# Patient Record
Sex: Male | Born: 1955 | Race: White | Hispanic: No | Marital: Married | Smoking: Former smoker
Health system: Southern US, Community
[De-identification: ages and names within clinical notes are randomized; demographics above are authoritative.]

## PROBLEM LIST (undated history)

## (undated) DIAGNOSIS — M1990 Unspecified osteoarthritis, unspecified site: Secondary | ICD-10-CM

## (undated) DIAGNOSIS — Z87891 Personal history of nicotine dependence: Secondary | ICD-10-CM

## (undated) DIAGNOSIS — I509 Heart failure, unspecified: Secondary | ICD-10-CM

## (undated) DIAGNOSIS — G459 Transient cerebral ischemic attack, unspecified: Secondary | ICD-10-CM

## (undated) DIAGNOSIS — R9430 Abnormal result of cardiovascular function study, unspecified: Secondary | ICD-10-CM

## (undated) DIAGNOSIS — I358 Other nonrheumatic aortic valve disorders: Secondary | ICD-10-CM

## (undated) DIAGNOSIS — T8859XA Other complications of anesthesia, initial encounter: Secondary | ICD-10-CM

## (undated) DIAGNOSIS — E8770 Fluid overload, unspecified: Secondary | ICD-10-CM

## (undated) DIAGNOSIS — I739 Peripheral vascular disease, unspecified: Secondary | ICD-10-CM

## (undated) DIAGNOSIS — R42 Dizziness and giddiness: Secondary | ICD-10-CM

## (undated) DIAGNOSIS — I219 Acute myocardial infarction, unspecified: Secondary | ICD-10-CM

## (undated) DIAGNOSIS — E785 Hyperlipidemia, unspecified: Secondary | ICD-10-CM

## (undated) DIAGNOSIS — I10 Essential (primary) hypertension: Secondary | ICD-10-CM

## (undated) DIAGNOSIS — Z951 Presence of aortocoronary bypass graft: Secondary | ICD-10-CM

## (undated) DIAGNOSIS — E119 Type 2 diabetes mellitus without complications: Secondary | ICD-10-CM

## (undated) DIAGNOSIS — I340 Nonrheumatic mitral (valve) insufficiency: Secondary | ICD-10-CM

## (undated) DIAGNOSIS — G479 Sleep disorder, unspecified: Secondary | ICD-10-CM

## (undated) DIAGNOSIS — I779 Disorder of arteries and arterioles, unspecified: Secondary | ICD-10-CM

## (undated) DIAGNOSIS — S060XAA Concussion with loss of consciousness status unknown, initial encounter: Secondary | ICD-10-CM

## (undated) DIAGNOSIS — IMO0002 Reserved for concepts with insufficient information to code with codable children: Secondary | ICD-10-CM

## (undated) DIAGNOSIS — I2510 Atherosclerotic heart disease of native coronary artery without angina pectoris: Secondary | ICD-10-CM

## (undated) HISTORY — DX: Fluid overload, unspecified: E87.70

## (undated) HISTORY — PX: CHOLECYSTECTOMY: SHX55

## (undated) HISTORY — DX: Abnormal result of cardiovascular function study, unspecified: R94.30

## (undated) HISTORY — DX: Sleep disorder, unspecified: G47.9

## (undated) HISTORY — DX: Disorder of arteries and arterioles, unspecified: I77.9

## (undated) HISTORY — DX: Dizziness and giddiness: R42

## (undated) HISTORY — PX: CORONARY ARTERY BYPASS GRAFT: SHX141

## (undated) HISTORY — DX: Nonrheumatic mitral (valve) insufficiency: I34.0

## (undated) HISTORY — DX: Personal history of nicotine dependence: Z87.891

## (undated) HISTORY — DX: Essential (primary) hypertension: I10

## (undated) HISTORY — DX: Hyperlipidemia, unspecified: E78.5

## (undated) HISTORY — DX: Atherosclerotic heart disease of native coronary artery without angina pectoris: I25.10

## (undated) HISTORY — DX: Peripheral vascular disease, unspecified: I73.9

## (undated) HISTORY — DX: Presence of aortocoronary bypass graft: Z95.1

## (undated) HISTORY — DX: Other nonrheumatic aortic valve disorders: I35.8

## (undated) HISTORY — DX: Transient cerebral ischemic attack, unspecified: G45.9

## (undated) HISTORY — DX: Reserved for concepts with insufficient information to code with codable children: IMO0002

---

## 1999-12-30 ENCOUNTER — Emergency Department (HOSPITAL_COMMUNITY): Admission: EM | Admit: 1999-12-30 | Discharge: 1999-12-30 | Payer: Self-pay | Admitting: *Deleted

## 2002-01-02 DIAGNOSIS — Z951 Presence of aortocoronary bypass graft: Secondary | ICD-10-CM

## 2002-01-02 HISTORY — DX: Presence of aortocoronary bypass graft: Z95.1

## 2002-01-25 ENCOUNTER — Inpatient Hospital Stay (HOSPITAL_COMMUNITY): Admission: RE | Admit: 2002-01-25 | Discharge: 2002-01-30 | Payer: Self-pay | Admitting: Cardiovascular Disease

## 2002-01-26 ENCOUNTER — Encounter: Payer: Self-pay | Admitting: Thoracic Surgery (Cardiothoracic Vascular Surgery)

## 2002-01-27 ENCOUNTER — Encounter: Payer: Self-pay | Admitting: Thoracic Surgery (Cardiothoracic Vascular Surgery)

## 2002-01-28 ENCOUNTER — Encounter: Payer: Self-pay | Admitting: Surgery

## 2002-01-29 ENCOUNTER — Encounter: Payer: Self-pay | Admitting: Thoracic Surgery (Cardiothoracic Vascular Surgery)

## 2002-03-12 ENCOUNTER — Encounter (HOSPITAL_COMMUNITY): Admission: RE | Admit: 2002-03-12 | Discharge: 2002-06-10 | Payer: Self-pay | Admitting: Cardiology

## 2003-08-12 ENCOUNTER — Ambulatory Visit: Admission: RE | Admit: 2003-08-12 | Discharge: 2003-08-12 | Payer: Self-pay | Admitting: Cardiology

## 2003-11-25 ENCOUNTER — Ambulatory Visit: Admission: RE | Admit: 2003-11-25 | Discharge: 2003-11-25 | Payer: Self-pay | Admitting: Cardiology

## 2004-02-17 ENCOUNTER — Encounter: Payer: Self-pay | Admitting: Cardiology

## 2004-02-20 ENCOUNTER — Encounter (INDEPENDENT_AMBULATORY_CARE_PROVIDER_SITE_OTHER): Payer: Self-pay | Admitting: *Deleted

## 2004-02-20 ENCOUNTER — Observation Stay (HOSPITAL_COMMUNITY): Admission: RE | Admit: 2004-02-20 | Discharge: 2004-02-21 | Payer: Self-pay | Admitting: General Surgery

## 2004-05-29 ENCOUNTER — Ambulatory Visit: Payer: Self-pay | Admitting: Internal Medicine

## 2004-06-03 ENCOUNTER — Ambulatory Visit: Payer: Self-pay | Admitting: Internal Medicine

## 2004-06-10 ENCOUNTER — Ambulatory Visit: Payer: Self-pay | Admitting: Cardiology

## 2004-06-10 ENCOUNTER — Ambulatory Visit: Payer: Self-pay | Admitting: Internal Medicine

## 2004-08-07 ENCOUNTER — Ambulatory Visit: Payer: Self-pay | Admitting: Cardiology

## 2004-08-28 ENCOUNTER — Ambulatory Visit: Admission: RE | Admit: 2004-08-28 | Discharge: 2004-08-28 | Payer: Self-pay | Admitting: Pulmonary Disease

## 2004-09-01 ENCOUNTER — Ambulatory Visit: Payer: Self-pay | Admitting: Internal Medicine

## 2004-09-18 ENCOUNTER — Ambulatory Visit: Payer: Self-pay | Admitting: Cardiology

## 2004-10-29 ENCOUNTER — Ambulatory Visit: Payer: Self-pay | Admitting: Cardiology

## 2004-11-12 ENCOUNTER — Ambulatory Visit: Payer: Self-pay | Admitting: Cardiology

## 2004-12-09 ENCOUNTER — Ambulatory Visit: Payer: Self-pay | Admitting: Cardiology

## 2005-01-13 ENCOUNTER — Ambulatory Visit: Payer: Self-pay | Admitting: Cardiology

## 2005-01-25 ENCOUNTER — Ambulatory Visit: Payer: Self-pay | Admitting: Cardiology

## 2005-01-25 ENCOUNTER — Ambulatory Visit: Payer: Self-pay | Admitting: Internal Medicine

## 2005-01-26 ENCOUNTER — Ambulatory Visit: Payer: Self-pay | Admitting: Cardiology

## 2005-02-26 ENCOUNTER — Ambulatory Visit: Payer: Self-pay | Admitting: Internal Medicine

## 2005-03-01 ENCOUNTER — Ambulatory Visit: Payer: Self-pay

## 2005-03-01 ENCOUNTER — Encounter: Payer: Self-pay | Admitting: Cardiology

## 2005-03-15 ENCOUNTER — Ambulatory Visit: Payer: Self-pay | Admitting: Cardiology

## 2005-04-30 ENCOUNTER — Ambulatory Visit: Payer: Self-pay | Admitting: Cardiology

## 2005-06-04 ENCOUNTER — Ambulatory Visit: Payer: Self-pay | Admitting: Internal Medicine

## 2005-07-29 ENCOUNTER — Ambulatory Visit: Payer: Self-pay | Admitting: Cardiology

## 2005-07-30 ENCOUNTER — Ambulatory Visit: Payer: Self-pay | Admitting: Cardiology

## 2005-08-09 ENCOUNTER — Ambulatory Visit: Payer: Self-pay | Admitting: Cardiology

## 2005-09-08 ENCOUNTER — Ambulatory Visit: Admission: RE | Admit: 2005-09-08 | Discharge: 2005-09-08 | Payer: Self-pay | Admitting: Pulmonary Disease

## 2005-09-08 ENCOUNTER — Ambulatory Visit: Payer: Self-pay | Admitting: Acute Care

## 2005-11-19 ENCOUNTER — Ambulatory Visit: Payer: Self-pay | Admitting: Cardiology

## 2006-05-05 ENCOUNTER — Ambulatory Visit: Payer: Self-pay | Admitting: Cardiology

## 2006-05-09 ENCOUNTER — Ambulatory Visit: Payer: Self-pay | Admitting: Cardiology

## 2006-05-09 LAB — CONVERTED CEMR LAB
Chol/HDL Ratio, serum: 4.2
LDL Cholesterol: 73 mg/dL (ref 0–99)
Triglyceride fasting, serum: 92 mg/dL (ref 0–149)

## 2006-06-17 ENCOUNTER — Ambulatory Visit: Payer: Self-pay | Admitting: Cardiology

## 2006-06-17 LAB — CONVERTED CEMR LAB
ALT: 23 units/L (ref 0–40)
Albumin: 3.9 g/dL (ref 3.5–5.2)
Total Protein: 7.1 g/dL (ref 6.0–8.3)

## 2006-08-23 ENCOUNTER — Ambulatory Visit: Payer: Self-pay | Admitting: Cardiology

## 2006-09-22 ENCOUNTER — Ambulatory Visit: Payer: Self-pay | Admitting: Cardiology

## 2006-09-22 LAB — CONVERTED CEMR LAB
Albumin: 3.7 g/dL (ref 3.5–5.2)
Bilirubin, Direct: 0.2 mg/dL (ref 0.0–0.3)
LDL Cholesterol: 52 mg/dL (ref 0–99)
Total Bilirubin: 1 mg/dL (ref 0.3–1.2)
Total CHOL/HDL Ratio: 2.9
Total Protein: 7.2 g/dL (ref 6.0–8.3)
VLDL: 12 mg/dL (ref 0–40)

## 2006-10-28 ENCOUNTER — Ambulatory Visit: Payer: Self-pay | Admitting: Cardiology

## 2006-12-19 ENCOUNTER — Ambulatory Visit: Payer: Self-pay | Admitting: *Deleted

## 2007-01-11 ENCOUNTER — Ambulatory Visit: Payer: Self-pay | Admitting: Cardiology

## 2007-01-11 LAB — CONVERTED CEMR LAB
BUN: 11 mg/dL (ref 6–23)
Calcium: 9.1 mg/dL (ref 8.4–10.5)
Chloride: 107 meq/L (ref 96–112)
Creatinine, Ser: 1 mg/dL (ref 0.4–1.5)
Glucose, Bld: 133 mg/dL — ABNORMAL HIGH (ref 70–99)

## 2007-02-23 ENCOUNTER — Ambulatory Visit: Payer: Self-pay | Admitting: Internal Medicine

## 2007-02-23 ENCOUNTER — Encounter: Payer: Self-pay | Admitting: Internal Medicine

## 2007-04-21 ENCOUNTER — Ambulatory Visit: Payer: Self-pay | Admitting: Cardiology

## 2007-04-21 LAB — CONVERTED CEMR LAB
Albumin: 4 g/dL (ref 3.5–5.2)
Alkaline Phosphatase: 55 units/L (ref 39–117)
HDL: 28.5 mg/dL — ABNORMAL LOW (ref >39.0)
LDL Cholesterol: 41 mg/dL (ref 0–99)
Total CHOL/HDL Ratio: 2.8
Total Protein: 7.2 g/dL (ref 6.0–8.3)
Triglycerides: 58 mg/dL (ref 0–149)

## 2007-04-28 ENCOUNTER — Ambulatory Visit: Payer: Self-pay | Admitting: Pulmonary Disease

## 2007-11-22 ENCOUNTER — Ambulatory Visit: Payer: Self-pay | Admitting: Cardiology

## 2008-04-30 ENCOUNTER — Telehealth: Payer: Self-pay | Admitting: Internal Medicine

## 2008-05-01 ENCOUNTER — Ambulatory Visit: Payer: Self-pay | Admitting: Internal Medicine

## 2008-05-01 DIAGNOSIS — H6590 Unspecified nonsuppurative otitis media, unspecified ear: Secondary | ICD-10-CM | POA: Insufficient documentation

## 2008-05-14 ENCOUNTER — Ambulatory Visit: Payer: Self-pay | Admitting: Cardiology

## 2008-06-10 ENCOUNTER — Ambulatory Visit: Payer: Self-pay | Admitting: Cardiology

## 2008-06-10 LAB — CONVERTED CEMR LAB
ALT: 45 units/L (ref 0–53)
HDL: 38.8 mg/dL — ABNORMAL LOW (ref >39.0)
Total Protein: 7.5 g/dL (ref 6.0–8.3)

## 2008-10-10 ENCOUNTER — Telehealth: Payer: Self-pay | Admitting: Internal Medicine

## 2008-11-22 ENCOUNTER — Encounter (INDEPENDENT_AMBULATORY_CARE_PROVIDER_SITE_OTHER): Payer: Self-pay | Admitting: *Deleted

## 2008-11-26 ENCOUNTER — Telehealth: Payer: Self-pay | Admitting: Cardiology

## 2008-11-27 ENCOUNTER — Ambulatory Visit: Payer: Self-pay | Admitting: Internal Medicine

## 2008-12-30 ENCOUNTER — Encounter: Payer: Self-pay | Admitting: Cardiology

## 2009-01-01 ENCOUNTER — Ambulatory Visit: Payer: Self-pay | Admitting: Internal Medicine

## 2009-01-01 LAB — CONVERTED CEMR LAB
ALT: 28 units/L (ref 0–53)
Albumin: 3.9 g/dL (ref 3.5–5.2)
Basophils Relative: 0.9 % (ref 0.0–3.0)
Cholesterol: 103 mg/dL (ref 0–200)
Creatinine, Ser: 0.9 mg/dL (ref 0.4–1.5)
Eosinophils Relative: 1.9 % (ref 0.0–5.0)
GFR calc non Af Amer: 93.33 mL/min (ref >60)
Glucose, Bld: 153 mg/dL — ABNORMAL HIGH (ref 70–99)
HCT: 40.9 % (ref 39.0–52.0)
Hemoglobin: 14.1 g/dL (ref 13.0–17.0)
LDL Cholesterol: 58 mg/dL (ref 0–99)
Leukocytes, UA: NEGATIVE
Lymphs Abs: 2.1 10*3/uL (ref 0.7–4.0)
MCHC: 34.5 g/dL (ref 30.0–36.0)
MCV: 95.5 fL (ref 78.0–100.0)
Monocytes Absolute: 0.5 10*3/uL (ref 0.1–1.0)
Monocytes Relative: 9.7 % (ref 3.0–12.0)
Neutro Abs: 2.8 10*3/uL (ref 1.4–7.7)
Neutrophils Relative %: 50.5 % (ref 43.0–77.0)
Nitrite: NEGATIVE
Platelets: 183 10*3/uL (ref 150.0–400.0)
Potassium: 4.1 meq/L (ref 3.5–5.1)
Total Bilirubin: 1.1 mg/dL (ref 0.3–1.2)
Triglycerides: 81 mg/dL (ref 0.0–149.0)
Urine Glucose: NEGATIVE mg/dL
Urobilinogen, UA: 0.2 (ref 0.0–1.0)

## 2009-01-15 ENCOUNTER — Ambulatory Visit: Payer: Self-pay | Admitting: Internal Medicine

## 2009-01-15 ENCOUNTER — Encounter: Payer: Self-pay | Admitting: Cardiology

## 2009-01-15 DIAGNOSIS — R0609 Other forms of dyspnea: Secondary | ICD-10-CM | POA: Insufficient documentation

## 2009-01-15 DIAGNOSIS — R0989 Other specified symptoms and signs involving the circulatory and respiratory systems: Secondary | ICD-10-CM | POA: Insufficient documentation

## 2009-01-15 DIAGNOSIS — R7989 Other specified abnormal findings of blood chemistry: Secondary | ICD-10-CM | POA: Insufficient documentation

## 2009-01-16 ENCOUNTER — Ambulatory Visit: Payer: Self-pay | Admitting: Cardiology

## 2009-01-28 ENCOUNTER — Ambulatory Visit: Payer: Self-pay

## 2009-01-29 ENCOUNTER — Encounter: Payer: Self-pay | Admitting: Cardiology

## 2009-03-05 ENCOUNTER — Ambulatory Visit: Payer: Self-pay | Admitting: Gastroenterology

## 2009-03-19 ENCOUNTER — Ambulatory Visit: Payer: Self-pay | Admitting: Gastroenterology

## 2009-03-19 ENCOUNTER — Encounter: Payer: Self-pay | Admitting: Gastroenterology

## 2009-03-19 HISTORY — PX: COLONOSCOPY: SHX174

## 2009-03-24 ENCOUNTER — Encounter: Payer: Self-pay | Admitting: Gastroenterology

## 2009-05-21 ENCOUNTER — Encounter (INDEPENDENT_AMBULATORY_CARE_PROVIDER_SITE_OTHER): Payer: Self-pay | Admitting: *Deleted

## 2009-07-09 ENCOUNTER — Ambulatory Visit: Payer: Self-pay | Admitting: Internal Medicine

## 2009-07-09 LAB — CONVERTED CEMR LAB
Creatinine, Ser: 1 mg/dL (ref 0.4–1.5)
Glucose, Bld: 139 mg/dL — ABNORMAL HIGH (ref 70–99)
Hgb A1c MFr Bld: 6.9 % — ABNORMAL HIGH (ref 4.6–6.5)
Potassium: 4.1 meq/L (ref 3.5–5.1)
Sodium: 140 meq/L (ref 135–145)

## 2009-07-11 ENCOUNTER — Ambulatory Visit: Payer: Self-pay | Admitting: Internal Medicine

## 2009-07-16 ENCOUNTER — Telehealth: Payer: Self-pay | Admitting: Internal Medicine

## 2009-09-30 ENCOUNTER — Telehealth: Payer: Self-pay | Admitting: Internal Medicine

## 2009-09-30 ENCOUNTER — Ambulatory Visit: Payer: Self-pay | Admitting: Internal Medicine

## 2009-09-30 DIAGNOSIS — R21 Rash and other nonspecific skin eruption: Secondary | ICD-10-CM | POA: Insufficient documentation

## 2009-09-30 DIAGNOSIS — M79609 Pain in unspecified limb: Secondary | ICD-10-CM | POA: Insufficient documentation

## 2009-11-06 ENCOUNTER — Encounter: Payer: Self-pay | Admitting: Cardiology

## 2009-11-07 ENCOUNTER — Ambulatory Visit: Payer: Self-pay | Admitting: Cardiology

## 2009-11-10 ENCOUNTER — Encounter: Payer: Self-pay | Admitting: Cardiology

## 2009-11-10 LAB — CONVERTED CEMR LAB
ALT: 23 units/L (ref 0–53)
AST: 23 units/L (ref 0–37)
Albumin: 4 g/dL (ref 3.5–5.2)
Alkaline Phosphatase: 62 units/L (ref 39–117)
Basophils Absolute: 0 10*3/uL (ref 0.0–0.1)
Bilirubin, Direct: 0.1 mg/dL (ref 0.0–0.3)
Calcium: 9.3 mg/dL (ref 8.4–10.5)
Cholesterol: 93 mg/dL (ref 0–200)
Creatinine, Ser: 0.7 mg/dL (ref 0.4–1.5)
Eosinophils Relative: 1.9 % (ref 0.0–5.0)
GFR calc non Af Amer: 116.61 mL/min (ref >60)
Glucose, Bld: 129 mg/dL — ABNORMAL HIGH (ref 70–99)
HCT: 41.8 % (ref 39.0–52.0)
Lymphs Abs: 2.2 10*3/uL (ref 0.7–4.0)
MCV: 95.2 fL (ref 78.0–100.0)
Monocytes Absolute: 0.6 10*3/uL (ref 0.1–1.0)
Monocytes Relative: 10.3 % (ref 3.0–12.0)
Neutrophils Relative %: 49.8 % (ref 43.0–77.0)
Platelets: 192 10*3/uL (ref 150.0–400.0)
Potassium: 4 meq/L (ref 3.5–5.1)
RDW: 12.6 % (ref 11.5–14.6)
Total Protein: 7.2 g/dL (ref 6.0–8.3)
Triglycerides: 86 mg/dL (ref 0.0–149.0)
WBC: 5.8 10*3/uL (ref 4.5–10.5)

## 2009-11-28 ENCOUNTER — Encounter: Payer: Self-pay | Admitting: Cardiology

## 2009-11-28 ENCOUNTER — Ambulatory Visit (HOSPITAL_COMMUNITY): Admission: RE | Admit: 2009-11-28 | Discharge: 2009-11-28 | Payer: Self-pay | Admitting: Cardiology

## 2009-11-28 ENCOUNTER — Ambulatory Visit: Payer: Self-pay | Admitting: Cardiovascular Disease

## 2009-11-28 ENCOUNTER — Ambulatory Visit: Payer: Self-pay

## 2009-12-17 ENCOUNTER — Encounter: Payer: Self-pay | Admitting: Cardiology

## 2010-01-19 ENCOUNTER — Ambulatory Visit: Payer: Self-pay | Admitting: Internal Medicine

## 2010-01-19 DIAGNOSIS — L570 Actinic keratosis: Secondary | ICD-10-CM | POA: Insufficient documentation

## 2010-01-19 DIAGNOSIS — M542 Cervicalgia: Secondary | ICD-10-CM

## 2010-01-30 ENCOUNTER — Ambulatory Visit: Payer: Self-pay

## 2010-01-30 ENCOUNTER — Encounter: Payer: Self-pay | Admitting: Cardiology

## 2010-02-09 ENCOUNTER — Telehealth: Payer: Self-pay | Admitting: Internal Medicine

## 2010-02-19 ENCOUNTER — Encounter: Payer: Self-pay | Admitting: Cardiology

## 2010-02-24 ENCOUNTER — Telehealth: Payer: Self-pay | Admitting: Cardiology

## 2010-03-18 ENCOUNTER — Ambulatory Visit: Payer: Self-pay | Admitting: Internal Medicine

## 2010-03-18 LAB — CONVERTED CEMR LAB
ALT: 31 units/L (ref 0–53)
BUN: 19 mg/dL (ref 6–23)
Basophils Absolute: 0 10*3/uL (ref 0.0–0.1)
Bilirubin, Direct: 0.2 mg/dL (ref 0.0–0.3)
Calcium: 9.4 mg/dL (ref 8.4–10.5)
Cholesterol: 113 mg/dL (ref 0–200)
Creatinine, Ser: 0.9 mg/dL (ref 0.4–1.5)
Eosinophils Absolute: 0.1 10*3/uL (ref 0.0–0.7)
GFR calc non Af Amer: 91.74 mL/min (ref >60)
HDL: 28.6 mg/dL — ABNORMAL LOW (ref >39.00)
Hemoglobin, Urine: NEGATIVE
Hemoglobin: 14.4 g/dL (ref 13.0–17.0)
LDL Cholesterol: 62 mg/dL (ref 0–99)
Lymphocytes Relative: 31.9 % (ref 12.0–46.0)
MCHC: 34.9 g/dL (ref 30.0–36.0)
Neutro Abs: 3.8 10*3/uL (ref 1.4–7.7)
PSA: 0.69 ng/mL (ref 0.10–4.00)
Platelets: 207 10*3/uL (ref 150.0–400.0)
RDW: 13.1 % (ref 11.5–14.6)
TSH: 1.98 microintl units/mL (ref 0.35–5.50)
Total Bilirubin: 0.7 mg/dL (ref 0.3–1.2)
Triglycerides: 113 mg/dL (ref 0.0–149.0)
Urine Glucose: NEGATIVE mg/dL
Urobilinogen, UA: 0.2 (ref 0.0–1.0)
VLDL: 22.6 mg/dL (ref 0.0–40.0)

## 2010-03-23 ENCOUNTER — Ambulatory Visit: Payer: Self-pay | Admitting: Internal Medicine

## 2010-05-15 ENCOUNTER — Ambulatory Visit: Payer: Self-pay | Admitting: Internal Medicine

## 2010-05-15 DIAGNOSIS — M25569 Pain in unspecified knee: Secondary | ICD-10-CM | POA: Insufficient documentation

## 2010-05-15 DIAGNOSIS — J40 Bronchitis, not specified as acute or chronic: Secondary | ICD-10-CM

## 2010-08-02 LAB — CONVERTED CEMR LAB
ALT: 26 units/L (ref 0–53)
AST: 25 units/L (ref 0–37)
Alkaline Phosphatase: 55 units/L (ref 39–117)
Cholesterol: 112 mg/dL (ref 0–200)
HDL: 28.5 mg/dL — ABNORMAL LOW (ref >39.0)
LDL Cholesterol: 63 mg/dL (ref 0–99)
Total Bilirubin: 1 mg/dL (ref 0.3–1.2)
Total Protein: 7.7 g/dL (ref 6.0–8.3)
Triglycerides: 102 mg/dL (ref 0–149)

## 2010-08-06 NOTE — Letter (Signed)
Summary: test results  [facility], Main Office  [address]   [place], [place] [address]   Phone: [phone]  Fax: [phone]        December 17, 2009 MRN: [id number]    [name] [address] [place], [place]  [address]    Dear Mr. Amirault,  We have been unable to reach you by phone regarding your test results.  Please give our office a call to receive your results.     Sincerely,  Meredith Staggers, RN  This letter has been electronically signed by your physician.

## 2010-08-06 NOTE — Progress Notes (Signed)
Summary: test results  Phone Note Call from Patient Call back at [phone]   Caller: Patient Summary of Call: Pt calling for test results Initial call taken by: Judie Grieve,  February 24, 2010 3:59 PM  Follow-up for Phone Call        pt given results Meredith Staggers, RN  February 24, 2010 4:25 PM

## 2010-08-06 NOTE — Letter (Signed)
Summary: Custom - Lipid  [facility], Main Office  [address]   [place], [place] [address]   Phone: [phone]  Fax: [phone]     Nov 10, 2009 MRN: [id number]   [name] [address] [place], [place]  [address]   Dear [name],  We have reviewed your cholesterol results.  They are as follows:     Total Cholesterol:    93 (Desirable: less than 200)       HDL  Cholesterol:     31.00  (Desirable: greater than 40 for men and 50 for women)       LDL Cholesterol:       45  (Desirable: less than 100 for low risk and less than 70 for moderate to high risk)       Triglycerides:       86.0  (Desirable: less than 150)  Our recommendations include:  Looks Great, continue current medications   Call our office at the number listed above if you have any questions.  Lowering your LDL cholesterol is important, but it is only one of a large number of "risk factors" that may indicate that you are at risk for heart disease, stroke or other complications of hardening of the arteries.  Other risk factors include:   A.  Cigarette Smoking* B.  High Blood Pressure* C.  Obesity* D.   Low HDL Cholesterol (see yours above)* E.   Diabetes Mellitus (higher risk if your is uncontrolled) F.  Family history of premature heart disease G.  Previous history of stroke or cardiovascular disease    *These are risk factors YOU HAVE CONTROL OVER.  For more information, visit .  There is now evidence that lowering the TOTAL CHOLESTEROL AND LDL CHOLESTEROL can reduce the risk of heart disease.  The American Heart Association recommends the following guidelines for the treatment of elevated cholesterol:  1.  If there is now current heart disease and less than two risk factors, TOTAL CHOLESTEROL should be less than 200 and LDL CHOLESTEROL should be less than 100. 2.  If there is current heart disease or two or more risk factors, TOTAL CHOLESTEROL should be less than 200 and LDL  CHOLESTEROL should be less than 70.  A diet low in cholesterol, saturated fat, and calories is the cornerstone of treatment for elevated cholesterol.  Cessation of smoking and exercise are also important in the management of elevated cholesterol and preventing vascular disease.  Studies have shown that 30 to 60 minutes of physical activity most days can help lower blood pressure, lower cholesterol, and keep your weight at a healthy level.  Drug therapy is used when cholesterol levels do not respond to therapeutic lifestyle changes (smoking cessation, diet, and exercise) and remains unacceptably high.  If medication is started, it is important to have you levels checked periodically to evaluate the need for further treatment options.  Thank you,    [facility] Team

## 2010-08-06 NOTE — Progress Notes (Signed)
Summary: OV TODAY  Phone Note Call from Patient   Summary of Call: Patient is requesting a call back. C/o leg pain x 2 wks. Wants to know if he needs office visit.  Initial call taken by: Lamar Sprinkles, CMA,  September 30, 2009 9:00 AM  Follow-up for Phone Call        Scheduled for eval today Follow-up by: Lamar Sprinkles, CMA,  September 30, 2009 9:02 AM

## 2010-08-06 NOTE — Assessment & Plan Note (Signed)
Summary: 6 mos well with labs/cd   Vital Signs:  Patient profile:   55 year old male Height:      73 inches (185.42 cm) Weight:      246 pounds (111.82 kg) BMI:     32.57 O2 Sat:      97 % on Room air Temp:     98.1 degrees F (36.72 degrees C) oral Pulse rate:   68 / minute Pulse rhythm:   regular Resp:     16 per minute BP sitting:   140 / 80  (left arm) Cuff size:   regular  Vitals Entered By: Lanier Prude, CMA(AAMA) (January 19, 2010 8:31 AM)  O2 Flow:  Room air CC: CPX Is Patient Diabetic? No Comments pt states he is taking Vytorin 1500mg  1 once daily.  He states he recently had EKG w/Dr. Myrtis Ser.   Primary Care Provider:  Tresa Garter MD  CC:  CPX.  History of Present Illness: The patient presents for a follow up of hypertension, diabetes, hyperlipidemia The patient presents for a wellness examination  C/o R thigh skin lesion  Current Medications (verified): 1)  Vytorin 10-80 Mg  Tabs (Ezetimibe-Simvastatin) .... Once Daily 2)  Protonix 40 Mg  Tbec (Pantoprazole Sodium) .... Once Daily Brand Medically Necessary 3)  Diovan 160 Mg  Tabs (Valsartan) .... Once Daily 4)  Aspirin Ec 81 Mg  Tbec (Aspirin) .... Once Daily 5)  Coreg 25 Mg  Tabs (Carvedilol) .Marland Kitchen.. 1 Tab Two Times A Day 6)  Niaspan 500 Mg Cr-Tabs (Niacin (Antihyperlipidemic)) .... Take 3 Tablets Daily 7)  Klor-Con 20 Meq  Pack (Potassium Chloride) .... As Needed 8)  Lasix 40 Mg  Tabs (Furosemide) .... As Needed 9)  Xyzal 5 Mg Tabs (Levocetirizine Dihydrochloride) .... Once Daily 10)  Vitamin D3 1000 Unit  Tabs (Cholecalciferol) .Marland Kitchen.. 1 By Mouth Daily 11)  Tramadol Hcl 50 Mg Tabs (Tramadol Hcl) .Marland Kitchen.. 1-2 Tabs By Mouth Two Times A Day As Needed Pain 12)  Ibuprofen 600 Mg Tabs (Ibuprofen) .Marland Kitchen.. 1 By Mouth Bid  Pc X 1 Wk Then As Needed For  Pain  Allergies (verified): No Known Drug Allergies  Past History:  Past Medical History: Last updated: 11/06/2009 Hypertension Peripheral vascular disease     Dr.  Georganna Skeans Transient ischemic attack, hx of heart murmur.Marland Kitchenaortic valve sclerosis CAD CABG July 2003  .... nuclear scan 2005 Ejection fraction 40%    does not need ICD....  echo 2006 Hyperlipidemia... low HDL Prior cigarette use stop Sleep evaluation by Dr. Shelle Iron.. recommendation to lose weight and consider sleep study later Volume overload Carotid artery disease.... Doppler... July, 2010... chronic occlusion left common carotid artery... 60-79% RICA, 0-39% LICA...unchanged.... followup one year  Past Surgical History: Coronary artery bypass graft  Family History: Reviewed history from 01/15/2009 and no changes required. Family History of CAD Male 1st degree relative <50 Family History Diabetes 1st degree relative Family History of Stroke M 1st degree relative <50  Social History: Reviewed history from 05/01/2008 and no changes required. Married Never Smoked Alcohol use-no Drug use-no Regular exercise-no  Review of Systems       The patient complains of weight gain.  The patient denies anorexia, fever, weight loss, vision loss, decreased hearing, hoarseness, chest pain, syncope, dyspnea on exertion, peripheral edema, prolonged cough, headaches, hemoptysis, abdominal pain, melena, hematochezia, severe indigestion/heartburn, hematuria, incontinence, genital sores, muscle weakness, suspicious skin lesions, transient blindness, difficulty walking, depression, unusual weight change, abnormal bleeding, enlarged lymph nodes, angioedema, and  testicular masses.    Physical Exam  General:  alert, well-developed, well-nourished, well-hydrated, appropriate dress, normal appearance, and overweight-appearing.   Head:  Normocephalic and atraumatic without obvious abnormalities. No apparent alopecia or balding. Eyes:  No corneal or conjunctival inflammation noted. EOMI. Perrla.  Ears:  serous fluid behind right TML ear normal and no external deformities.  TM is pearly grey with good light  reflex. nu bulging. negative pinna pull test. EAC neg for erythema, swelling, or discharge Nose:  External nasal examination shows no deformity or inflammation. Nasal mucosa are pink and moist without lesions or exudates. Mouth:  Oral mucosa and oropharynx without lesions or exudates.  Teeth in good repair. Neck:  supple, full ROM, no masses, and no thyromegaly.   Lungs:  Normal respiratory effort, chest expands symmetrically. Lungs are clear to auscultation, no crackles or wheezes. Heart:  Normal rate and regular rhythm. S1 and S2 normal without gallop, murmur, click, rub or other extra sounds. Abdomen:  Obese, soft and non-tender.   Rectal:  No external abnormalities noted. Normal sphincter tone. No rectal masses or tenderness. Genitalia:  Testes bilaterally descended without nodularity, tenderness or masses. No scrotal masses or lesions. No penis lesions or urethral discharge. Prostate:  Prostate gland firm and smooth, no enlargement, nodularity, tenderness, mass, asymmetry or induration. Msk:  Lumbar-sacral spine is tender to palpation over paraspinal muscles and painfull with the ROM  Stiff LS Pulses:  R and L carotid,radial,femoral,dorsalis pedis and posterior tibial pulses are full and equal bilaterally Extremities:  No pedal edema.   Neurologic:  Strait leg elev (-) B DTRs, MS OK Skin:  R thigh anter aspect AK x1 Cervical Nodes:  No lymphadenopathy noted Inguinal Nodes:  No significant adenopathy Psych:  Cognition and judgment appear intact. Alert and cooperative with normal attention span and concentration. No apparent delusions, illusions, hallucinations   Impression & Recommendations:  Problem # 1:  PHYSICAL EXAMINATION (ICD-V70.0) Assessment New Health and age related issues were discussed. Available screening tests and vaccinations were discussed as well. Healthy life style including good diet and execise was discussed.   Problem # 2:  EDEMA (ICD-782.3) Assessment:  Improved  Prednisone -Take 40mg  qd for 3 days, then 20 mg qd for 3 days, then 10mg  qd for 6 days, then stop. Take pc.   His updated medication list for this problem includes:    Lasix 40 Mg Tabs (Furosemide) .Marland Kitchen... As needed  Problem # 3:  ACTINIC KERATOSIS (ICD-702.0) R thigh  Procedure: cryo Indication: AK(s) Risks incl. scar(s), incomplete removal, ect.  and benefits discussed     1  lesion(s) on R anter thigh was/were treated with liqid N2 in usual fasion.  Tolerated well. Compl. none. Wound care instructions given.   Orders: Cryotherapy/Destruction benign or premalignant lesion (1st lesion)  (17000)  Problem # 4:  LEG PAIN (ICD-729.5) Assessment: Unchanged Discussed PT Use a roller Orders: T-Lumbar Spine 2 Views (72100TC)  Problem # 5:  CAD (ICD-414.00) Assessment: Unchanged  His updated medication list for this problem includes:    Diovan 160 Mg Tabs (Valsartan) ..... Once daily    Aspirin Ec 81 Mg Tbec (Aspirin) ..... Once daily    Coreg 25 Mg Tabs (Carvedilol) .Marland Kitchen... 1 tab two times a day    Lasix 40 Mg Tabs (Furosemide) .Marland Kitchen... As needed  Problem # 6:  PERIPHERAL VASCULAR DISEASE (ICD-443.9) Assessment: Unchanged  Complete Medication List: 1)  Vytorin 10-80 Mg Tabs (Ezetimibe-simvastatin) .... Once daily 2)  Protonix 40 Mg Tbec (Pantoprazole  sodium) .... Once daily brand medically necessary 3)  Diovan 160 Mg Tabs (Valsartan) .... Once daily 4)  Aspirin Ec 81 Mg Tbec (Aspirin) .... Once daily 5)  Coreg 25 Mg Tabs (Carvedilol) .Marland Kitchen.. 1 tab two times a day 6)  Niaspan 500 Mg Cr-tabs (Niacin (antihyperlipidemic)) .... Take 3 tablets daily 7)  Klor-con 20 Meq Pack (Potassium chloride) .... As needed 8)  Lasix 40 Mg Tabs (Furosemide) .... As needed 9)  Xyzal 5 Mg Tabs (Levocetirizine dihydrochloride) .... Once daily 10)  Vitamin D3 1000 Unit Tabs (Cholecalciferol) .Marland Kitchen.. 1 by mouth daily 11)  Tramadol Hcl 50 Mg Tabs (Tramadol hcl) .Marland Kitchen.. 1-2 tabs by mouth two times a day as  needed pain 12)  Ibuprofen 600 Mg Tabs (Ibuprofen) .Marland Kitchen.. 1 by mouth bid  pc x 1 wk then as needed for  pain 13)  Prednisone 10 Mg Tabs (Prednisone) .... Take 40mg  qd for 3 days, then 20 mg qd for 3 days, then 10mg  qd for 6 days, then stop. take pc.  Other Orders: T-Cervicle Spine 2-3 Views [id number])  Patient Instructions: 1)  Please schedule a follow-up appointment in 2-3 month. 2)  Use stretching and balance exercises that I have provided (15 min. or longer every day)  3)  BMP prior to visit, ICD-9: 4)  Hepatic Panel prior to visit, ICD-9: v70.0 5)  Lipid Panel prior to visit, ICD-9: 6)  TSH prior to visit, ICD-9: 7)  CBC w/ Diff prior to visit, ICD-9: 8)  Urine-dip prior to visit, ICD-9: 9)  PSA prior to visit, ICD-9: Prescriptions: LASIX 40 MG  TABS (FUROSEMIDE) as needed  #90 x 3   Entered and Authorized by:   Tresa Garter MD   Signed by:   Tresa Garter MD on 01/19/2010   Method used:   Print then Give to Patient   RxID:   [id number] KLOR-CON 20 MEQ  PACK (POTASSIUM CHLORIDE) as needed  #90 x 3   Entered and Authorized by:   Tresa Garter MD   Signed by:   Tresa Garter MD on 01/19/2010   Method used:   Print then Give to Patient   RxID:   [id number] NIASPAN 500 MG CR-TABS (NIACIN (ANTIHYPERLIPIDEMIC)) TAKE 3 TABLETS DAILY  #270 x 3   Entered and Authorized by:   Tresa Garter MD   Signed by:   Tresa Garter MD on 01/19/2010   Method used:   Print then Give to Patient   RxID:   [id number] COREG 25 MG  TABS (CARVEDILOL) 1 tab two times a day  #180 x 3   Entered and Authorized by:   Tresa Garter MD   Signed by:   Tresa Garter MD on 01/19/2010   Method used:   Print then Give to Patient   RxID:   [id number] DIOVAN 160 MG  TABS (VALSARTAN) once daily  #90 x 3   Entered and Authorized by:   Tresa Garter MD   Signed by:   Tresa Garter MD on 01/19/2010   Method used:   Print then Give to  Patient   RxID:   [id number] PROTONIX 40 MG  TBEC (PANTOPRAZOLE SODIUM) once daily Brand Medically Necessary Brand medically necessary #90 x 3   Entered and Authorized by:   Tresa Garter MD   Signed by:   Tresa Garter MD on 01/19/2010   Method used:   Print then Give to Patient   RxID:  [id number] VYTORIN 10-80 MG  TABS (EZETIMIBE-SIMVASTATIN) once daily  #90 x 3   Entered and Authorized by:   Tresa Garter MD   Signed by:   Tresa Garter MD on 01/19/2010   Method used:   Print then Give to Patient   RxID:   [id number] PREDNISONE 10 MG TABS (PREDNISONE) Take 40mg  qd for 3 days, then 20 mg qd for 3 days, then 10mg  qd for 6 days, then stop. Take pc.  #24 x 1   Entered and Authorized by:   Tresa Garter MD   Signed by:   Tresa Garter MD on 01/19/2010   Method used:   Print then Give to Patient   RxID:   [id number]

## 2010-08-06 NOTE — Assessment & Plan Note (Signed)
Summary: knee pain x 3 wks/SD   Vital Signs:  Patient profile:   55 year old male Height:      73 inches Weight:      247 pounds BMI:     32.71 Temp:     97.9 degrees F oral Pulse rate:   80 / minute Pulse rhythm:   regular Resp:     16 per minute BP sitting:   124 / 70  (left arm) Cuff size:   regular  Vitals Entered By: Lanier Prude, CMA(AAMA) (May 15, 2010 10:23 AM) CC: Rt knee pain and cough X 2 wks Is Patient Diabetic? No   Primary Care Provider:  Georgina Quint Plotnikov MD  CC:  Rt knee pain and cough X 2 wks.  History of Present Illness: C/o R knee pain off and on x 2 months worse w/kneeling; sporadic now, can last x days The patient presents for a follow up of hypertension, elev. glu, CAD, hyperlipidemia  The patient presents with complaints of sore throat, fever, cough, sinus congestion and drainge of several days duration. Not better with OTC meds.  Can't sleep due to cough..  The mucus is colored.   Current Medications (verified): 1)  Vytorin 10-80 Mg  Tabs (Ezetimibe-Simvastatin) .... Once Daily 2)  Protonix 40 Mg  Tbec (Pantoprazole Sodium) .... Once Daily Brand Medically Necessary 3)  Diovan 160 Mg  Tabs (Valsartan) .... Once Daily 4)  Aspirin Ec 81 Mg  Tbec (Aspirin) .... Once Daily 5)  Coreg 25 Mg  Tabs (Carvedilol) .Marland Kitchen.. 1 Tab Two Times A Day 6)  Niaspan 500 Mg Cr-Tabs (Niacin (Antihyperlipidemic)) .... Take 3 Tablets Daily 7)  Klor-Con 20 Meq  Pack (Potassium Chloride) .... As Needed 8)  Lasix 40 Mg  Tabs (Furosemide) .... As Needed 9)  Xyzal 5 Mg Tabs (Levocetirizine Dihydrochloride) .... Once Daily 10)  Vitamin D3 1000 Unit  Tabs (Cholecalciferol) .Marland Kitchen.. 1 By Mouth Daily 11)  Tramadol Hcl 50 Mg Tabs (Tramadol Hcl) .Marland Kitchen.. 1-2 Tabs By Mouth Two Times A Day As Needed Pain 12)  Ibuprofen 600 Mg Tabs (Ibuprofen) .Marland Kitchen.. 1 By Mouth Bid  Pc X 1 Wk Then As Needed For  Pain  Allergies (verified): No Known Drug Allergies  Past History:  Past Medical  History: Last updated: 03/23/2010 Hypertension Peripheral vascular disease     Dr. Georganna Skeans Transient ischemic attack, hx of heart murmur.Marland Kitchenaortic valve sclerosis CAD CABG July 2003  .... nuclear scan 2005 Ejection fraction 40%    does not need ICD....  echo 2006 Hyperlipidemia... low HDL Prior cigarette use stop Sleep evaluation by Dr. Shelle Iron.. recommendation to lose weight and consider sleep study later Volume overload Carotid artery disease.... Doppler... July, 2010... chronic occlusion left common carotid artery... 60-79% RICA, 0-39% LICA , LCCA occluded...unchanged.... followup one year  Social History: Last updated: 05/01/2008 Married Never Smoked Alcohol use-no Drug use-no Regular exercise-no  Physical Exam  General:  alert, well-developed, well-nourished, well-hydrated, appropriate dress, normal appearance, and overweight-appearing.   Nose:  External nasal examination shows no deformity or inflammation. Nasal mucosa are pink and moist without lesions or exudates. Mouth:  Oral mucosa and oropharynx without lesions or exudates.  Teeth in good repair. Neck:  supple, full ROM, no masses, and no thyromegaly.   Lungs:  Normal respiratory effort, chest expands symmetrically. Lungs are clear to auscultation, no crackles or wheezes. Heart:  Normal rate and regular rhythm. S1 and S2 normal without gallop, murmur, click, rub or other extra sounds. Abdomen:  Obese, soft and non-tender.   Msk:  Lumbar-sacral spine is tender to palpation over paraspinal muscles and painfull with the ROM  Stiff LS R knee w/o swelling now, NT Neurologic:  Strait leg elev (-) B DTRs, MS OK Skin:  dry LE Psych:  Cognition and judgment appear intact. Alert and cooperative with normal attention span and concentration. No apparent delusions, illusions, hallucinations   Impression & Recommendations:  Problem # 1:  KNEE PAIN (ICD-719.46) R poss meniscal injury Assessment New  His updated medication  list for this problem includes:    Aspirin Ec 81 Mg Tbec (Aspirin) ..... Once daily    Tramadol Hcl 50 Mg Tabs (Tramadol hcl) .Marland Kitchen... 1-2 tabs by mouth two times a day as needed pain    Ibuprofen 600 Mg Tabs (Ibuprofen) .Marland Kitchen... 1 by mouth bid  pc x 1 wk then as needed for  pain  Orders: Orthopedic Referral (Ortho)  Problem # 2:  HYPERGLYCEMIA (ICD-790.29) Assessment: Comment Only  Problem # 3:  CAD (ICD-414.00) Assessment: Unchanged  His updated medication list for this problem includes:    Diovan 160 Mg Tabs (Valsartan) ..... Once daily    Aspirin Ec 81 Mg Tbec (Aspirin) ..... Once daily    Coreg 25 Mg Tabs (Carvedilol) .Marland Kitchen... 1 tab two times a day    Lasix 40 Mg Tabs (Furosemide) .Marland Kitchen... As needed  Problem # 4:  HYPERTENSION (ICD-401.9) Assessment: Comment Only  His updated medication list for this problem includes:    Diovan 160 Mg Tabs (Valsartan) ..... Once daily    Coreg 25 Mg Tabs (Carvedilol) .Marland Kitchen... 1 tab two times a day    Lasix 40 Mg Tabs (Furosemide) .Marland Kitchen... As needed  Problem # 5:  BRONCHITIS (ICD-490) Assessment: New  His updated medication list for this problem includes:    Zithromax Z-pak 250 Mg Tabs (Azithromycin) .Marland Kitchen... As dirrected    Tessalon Perles 100 Mg Caps (Benzonatate) .Marland Kitchen... 1-2 by mouth two times a day as needed cogh  Complete Medication List: 1)  Vytorin 10-80 Mg Tabs (Ezetimibe-simvastatin) .... Once daily 2)  Protonix 40 Mg Tbec (Pantoprazole sodium) .... Once daily brand medically necessary 3)  Diovan 160 Mg Tabs (Valsartan) .... Once daily 4)  Aspirin Ec 81 Mg Tbec (Aspirin) .... Once daily 5)  Coreg 25 Mg Tabs (Carvedilol) .Marland Kitchen.. 1 tab two times a day 6)  Niaspan 500 Mg Cr-tabs (Niacin (antihyperlipidemic)) .... Take 3 tablets daily 7)  Klor-con 20 Meq Pack (Potassium chloride) .... As needed 8)  Lasix 40 Mg Tabs (Furosemide) .... As needed 9)  Xyzal 5 Mg Tabs (Levocetirizine dihydrochloride) .... Once daily 10)  Vitamin D3 1000 Unit Tabs  (Cholecalciferol) .Marland Kitchen.. 1 by mouth daily 11)  Tramadol Hcl 50 Mg Tabs (Tramadol hcl) .Marland Kitchen.. 1-2 tabs by mouth two times a day as needed pain 12)  Ibuprofen 600 Mg Tabs (Ibuprofen) .Marland Kitchen.. 1 by mouth bid  pc x 1 wk then as needed for  pain 13)  Zithromax Z-pak 250 Mg Tabs (Azithromycin) .... As dirrected 14)  Tessalon Perles 100 Mg Caps (Benzonatate) .Marland Kitchen.. 1-2 by mouth two times a day as needed cogh  Patient Instructions: 1)  Please schedule a follow-up appointment in 3 months. 2)  BMP prior to visit, ICD-9: 3)  HbgA1C prior to visit, ICD-9:790.29 Prescriptions: TESSALON PERLES 100 MG CAPS (BENZONATATE) 1-2 by mouth two times a day as needed cogh  #120 x 1   Entered and Authorized by:   Tresa Garter MD   Signed  by:   Tresa Garter MD on 05/15/2010   Method used:   Print then Give to Patient   RxID:   [id number] ZITHROMAX Z-PAK 250 MG TABS (AZITHROMYCIN) as dirrected  #1 x 0   Entered and Authorized by:   Tresa Garter MD   Signed by:   Tresa Garter MD on 05/15/2010   Method used:   Print then Give to Patient   RxID:   [id number] DIOVAN 160 MG  TABS (VALSARTAN) once daily  #90 Tablet x 3   Entered and Authorized by:   Tresa Garter MD   Signed by:   Tresa Garter MD on 05/15/2010   Method used:   Print then Give to Patient   RxID:   [id number]    Orders Added: 1)  Orthopedic Referral [Ortho] 2)  Est. Patient Level IV [60630]

## 2010-08-06 NOTE — Miscellaneous (Signed)
  Clinical Lists Changes  Observations: Added new observation of PAST MED HX: Hypertension Peripheral vascular disease     Dr. Georganna Skeans Transient ischemic attack, hx of heart murmur.Marland Kitchenaortic valve sclerosis CAD CABG July 2003  .... nuclear scan 2005 Ejection fraction 40%    does not need ICD....  echo 2006 Hyperlipidemia... low HDL Prior cigarette use stop Sleep evaluation by Dr. Shelle Iron.. recommendation to lose weight and consider sleep study later Volume overload Carotid artery disease.... Doppler... July, 2010... chronic occlusion left common carotid artery... 60-79% RICA, 0-39% LICA...unchanged.... followup one year (11/06/2009 10:25) Added new observation of PRIMARY MD: Tresa Garter MD (11/06/2009 10:25)       Past History:  Past Medical History: Hypertension Peripheral vascular disease     Dr. Georganna Skeans Transient ischemic attack, hx of heart murmur.Marland Kitchenaortic valve sclerosis CAD CABG July 2003  .... nuclear scan 2005 Ejection fraction 40%    does not need ICD....  echo 2006 Hyperlipidemia... low HDL Prior cigarette use stop Sleep evaluation by Dr. Shelle Iron.. recommendation to lose weight and consider sleep study later Volume overload Carotid artery disease.... Doppler... July, 2010... chronic occlusion left common carotid artery... 60-79% RICA, 0-39% LICA...unchanged.... followup one year

## 2010-08-06 NOTE — Assessment & Plan Note (Signed)
Summary: rov/jss   Visit Type:  Follow-up Primary Provider:  Tresa Garter MD  CC:  CAD.  History of Present Illness: The patient is seen for followup of coronary artery disease.  He is stable.  I saw him last in July, 2011.  He's not having chest pain or shortness of breath.  He works regularly.  There's been no syncope or presyncope.  Current Medications (verified): 1)  Vytorin 10-80 Mg  Tabs (Ezetimibe-Simvastatin) .... Once Daily 2)  Protonix 40 Mg  Tbec (Pantoprazole Sodium) .... Once Daily Brand Medically Necessary 3)  Diovan 160 Mg  Tabs (Valsartan) .... Once Daily 4)  Aspirin Ec 81 Mg  Tbec (Aspirin) .... Once Daily 5)  Coreg 25 Mg  Tabs (Carvedilol) .Marland Kitchen.. 1 Tab Two Times A Day 6)  Niaspan 1000 Mg  Tbcr (Niacin (Antihyperlipidemic)) .Marland Kitchen.. 1 1/2 Tabs Once Daily 7)  Klor-Con 20 Meq  Pack (Potassium Chloride) .... As Needed 8)  Lasix 40 Mg  Tabs (Furosemide) .... As Needed 9)  Xyzal 5 Mg Tabs (Levocetirizine Dihydrochloride) .... Once Daily 10)  Vitamin D3 1000 Unit  Tabs (Cholecalciferol) .Marland Kitchen.. 1 By Mouth Daily 11)  Tramadol Hcl 50 Mg Tabs (Tramadol Hcl) .Marland Kitchen.. 1-2 Tabs By Mouth Two Times A Day As Needed Pain 12)  Ibuprofen 600 Mg Tabs (Ibuprofen) .Marland Kitchen.. 1 By Mouth Bid  Pc X 1 Wk Then As Needed For  Pain  Allergies (verified): No Known Drug Allergies  Past History:  Past Medical History: Last updated: 11/06/2009 Hypertension Peripheral vascular disease     Dr. Georganna Skeans Transient ischemic attack, hx of heart murmur.Marland Kitchenaortic valve sclerosis CAD CABG July 2003  .... nuclear scan 2005 Ejection fraction 40%    does not need ICD....  echo 2006 Hyperlipidemia... low HDL Prior cigarette use stop Sleep evaluation by Dr. Shelle Iron.. recommendation to lose weight and consider sleep study later Volume overload Carotid artery disease.... Doppler... July, 2010... chronic occlusion left common carotid artery... 60-79% RICA, 0-39% LICA...unchanged.... followup one year  Review of  Systems       Patient denies fever, chills, headache, sweats, rash, change in vision, change in hearing, chest pain, cough, nausea or vomiting, urinary symptoms.  All of the systems are reviewed and are negative.  Vital Signs:  Patient profile:   55 year old male Height:      73 inches Weight:      244.8 pounds BMI:     32.41 Pulse rate:   65 / minute BP sitting:   128 / 72  (left arm) Cuff size:   regular  Vitals Entered By: Lisabeth Devoid RN (Nov 07, 2009 8:58 AM)  Physical Exam  General:  he is stable today. Head:  head is atraumatic. Eyes:  no xanthelasma. Neck:  there is a soft right carotid bruit. Chest Wall:  no chest wall tenderness. Lungs:  lungs are clear.  Respiratory effort is nonlabored. Heart:  cardiac exam reveals S1 and S2.  There no clicks. 2/6 murmur Abdomen:  abdomen is soft. Msk:  no musculoskeletal deformities. Extremities:  no peripheral edema. Skin:  no skin rashes. Psych:  patient is oriented to person time and place.  Affect is normal.   Impression & Recommendations:  Problem # 1:  EDEMA (ICD-782.3) The patient has had no edema.  He is on a diuretic.  Chemistry will be checked.  Problem # 2:  MURMUR (ICD-785.2)  His updated medication list for this problem includes:    Diovan 160 Mg Tabs (Valsartan) .Marland KitchenMarland KitchenMarland KitchenMarland Kitchen  Once daily    Coreg 25 Mg Tabs (Carvedilol) .Marland Kitchen... 1 tab two times a day    Lasix 40 Mg Tabs (Furosemide) .Marland Kitchen... As needed There is a soft systolic murmur.  He needs a 2-D echo to reassess LV function.  We'll assess his valves at that time.  Problem # 3:  CAROTID BRUIT (ICD-785.9)  The patient has known significant carotid disease.  I reviewed his last office or from July, 2010.  He is scheduled to have one year followup.  We'll be sure that this is in the system.  Orders: Carotid Duplex (Carotid Duplex)  Problem # 4:  HYPERLIPIDEMIA (ICD-272.4)  His updated medication list for this problem includes:    Vytorin 10-80 Mg Tabs  (Ezetimibe-simvastatin) ..... Once daily    Niaspan 500 Mg Cr-tabs (Niacin (antihyperlipidemic)) .Marland Kitchen... Take 3 tablets daily The patient is on medications for his lipids.  He has not eaten today.  Fasting lipid profile be obtained.  Orders: TLB-BMP (Basic Metabolic Panel-BMET) (80048-METABOL) TLB-CBC Platelet - w/Differential (85025-CBCD) TLB-Lipid Panel (80061-LIPID) TLB-Hepatic/Liver Function Pnl (80076-HEPATIC)  Problem # 5:  * EF 40% Historically there is an ejection fraction of 40%.  It is 5 years since his last echo.  Two-dimensional echo will be obtained to reassess LV function.  Problem # 6:  CAD (ICD-414.00)  His updated medication list for this problem includes:    Aspirin Ec 81 Mg Tbec (Aspirin) ..... Once daily    Coreg 25 Mg Tabs (Carvedilol) .Marland Kitchen... 1 tab two times a day The patient is not having any significant symptoms.  No change in therapy.  Orders: TLB-BMP (Basic Metabolic Panel-BMET) (80048-METABOL) TLB-CBC Platelet - w/Differential (85025-CBCD) TLB-Lipid Panel (80061-LIPID) TLB-Hepatic/Liver Function Pnl (80076-HEPATIC) Echocardiogram (Echo)  Problem # 7:  HYPERTENSION (ICD-401.9)  His updated medication list for this problem includes:    Diovan 160 Mg Tabs (Valsartan) ..... Once daily    Aspirin Ec 81 Mg Tbec (Aspirin) ..... Once daily    Coreg 25 Mg Tabs (Carvedilol) .Marland Kitchen... 1 tab two times a day    Lasix 40 Mg Tabs (Furosemide) .Marland Kitchen... As needed BLlood pressure is under good control.  No change in therapy  Patient Instructions: 1)  Your physician recommends that you schedule a follow-up appointment in: 1 YEAR WITH DR. Yorel Redder 2)  Your physician recommends that you return for a FASTING lipid profile TODAY AS WELL AS BMET, CBC, LIVER 3)  Your physician has requested that you have an echocardiogram.  Echocardiography is a painless test that uses sound waves to create images of your heart. It provides your doctor with information about the size and shape of your heart  and how well your heart's chambers and valves are working.  This procedure takes approximately one hour. There are no restrictions for this procedure. 4)  Your physician has requested that you have a carotid duplex. This test is an ultrasound of the carotid arteries in your neck. It looks at blood flow through these arteries that supply the brain with blood. Allow one hour for this exam. There are no restrictions or special instructions. 5)  Your physician recommends that you continue on your current medications as directed. Please refer to the Current Medication list given to you today. Prescriptions: NIASPAN 500 MG CR-TABS (NIACIN (ANTIHYPERLIPIDEMIC)) TAKE 3 TABLETS DAILY  #90 x 11   Entered by:   Lisabeth Devoid RN   Authorized by:   Talitha Givens, MD, [facility]   Signed by:   Lisabeth Devoid RN on 11/07/2009  Method used:   Electronically to        [facility]* (mail-order)             ,          Ph: [phone]       Fax: [phone]   RxID:   [id number] NIASPAN 500 MG CR-TABS (NIACIN (ANTIHYPERLIPIDEMIC)) TAKE 3 TABLETS DAILY  #90 x 11   Entered by:   Lisabeth Devoid RN   Authorized by:   Talitha Givens, MD, [facility]   Signed by:   Lisabeth Devoid RN on 11/07/2009   Method used:   Electronically to        [facility]* (retail)       [address]       [place], [place]  [id number]       Ph: [phone]       Fax: [phone]   RxID:   [id number]

## 2010-08-06 NOTE — Assessment & Plan Note (Signed)
Summary: 2 MO ROV /NWS  #   Vital Signs:  Patient profile:   55 year old male Height:      73 inches Weight:      251 pounds BMI:     33.24 Temp:     97.2 degrees F oral Pulse rate:   68 / minute Pulse rhythm:   regular Resp:     16 per minute BP sitting:   120 / 68  (left arm) Cuff size:   large  Vitals Entered By: Lanier Prude, CMA(AAMA) (March 23, 2010 8:23 AM) CC: 2 mo f/u Is Patient Diabetic? No   Primary Care Ashleigh Arya:  Tresa Garter MD  CC:  2 mo f/u.  History of Present Illness: The patient presents for a follow up of hypertension, diabetes, hyperlipidemia, LBP, PVD   Preventive Screening-Counseling & Management  Alcohol-Tobacco     Smoking Status: never     Passive Smoke Exposure: no  Current Medications (verified): 1)  Vytorin 10-80 Mg  Tabs (Ezetimibe-Simvastatin) .... Once Daily 2)  Protonix 40 Mg  Tbec (Pantoprazole Sodium) .... Once Daily Brand Medically Necessary 3)  Diovan 160 Mg  Tabs (Valsartan) .... Once Daily 4)  Aspirin Ec 81 Mg  Tbec (Aspirin) .... Once Daily 5)  Coreg 25 Mg  Tabs (Carvedilol) .Marland Kitchen.. 1 Tab Two Times A Day 6)  Niaspan 500 Mg Cr-Tabs (Niacin (Antihyperlipidemic)) .... Take 3 Tablets Daily 7)  Klor-Con 20 Meq  Pack (Potassium Chloride) .... As Needed 8)  Lasix 40 Mg  Tabs (Furosemide) .... As Needed 9)  Xyzal 5 Mg Tabs (Levocetirizine Dihydrochloride) .... Once Daily 10)  Vitamin D3 1000 Unit  Tabs (Cholecalciferol) .Marland Kitchen.. 1 By Mouth Daily 11)  Tramadol Hcl 50 Mg Tabs (Tramadol Hcl) .Marland Kitchen.. 1-2 Tabs By Mouth Two Times A Day As Needed Pain 12)  Ibuprofen 600 Mg Tabs (Ibuprofen) .Marland Kitchen.. 1 By Mouth Bid  Pc X 1 Wk Then As Needed For  Pain 13)  Prednisone 10 Mg Tabs (Prednisone) .... Take 40mg  Qd For 3 Days, Then 20 Mg Qd For 3 Days, Then 10mg  Qd For 6 Days, Then Stop. Take Pc.  Allergies (verified): No Known Drug Allergies  Past History:  Past Surgical History: Last updated: 01/19/2010 Coronary artery bypass graft  Social  History: Last updated: 05/01/2008 Married Never Smoked Alcohol use-no Drug use-no Regular exercise-no  Past Medical History: Hypertension Peripheral vascular disease     Dr. Georganna Skeans Transient ischemic attack, hx of heart murmur.Marland Kitchenaortic valve sclerosis CAD CABG July 2003  .... nuclear scan 2005 Ejection fraction 40%    does not need ICD....  echo 2006 Hyperlipidemia... low HDL Prior cigarette use stop Sleep evaluation by Dr. Shelle Iron.. recommendation to lose weight and consider sleep study later Volume overload Carotid artery disease.... Doppler... July, 2010... chronic occlusion left common carotid artery... 60-79% RICA, 0-39% LICA , LCCA occluded...unchanged.... followup one year  Review of Systems  The patient denies fever, dyspnea on exertion, muscle weakness, and depression.    Physical Exam  General:  alert, well-developed, well-nourished, well-hydrated, appropriate dress, normal appearance, and overweight-appearing.   Nose:  External nasal examination shows no deformity or inflammation. Nasal mucosa are pink and moist without lesions or exudates. Mouth:  Oral mucosa and oropharynx without lesions or exudates.  Teeth in good repair. Neck:  supple, full ROM, no masses, and no thyromegaly.   Lungs:  Normal respiratory effort, chest expands symmetrically. Lungs are clear to auscultation, no crackles or wheezes. Heart:  Normal rate and  regular rhythm. S1 and S2 normal without gallop, murmur, click, rub or other extra sounds. Abdomen:  Obese, soft and non-tender.   Msk:  Lumbar-sacral spine is tender to palpation over paraspinal muscles and painfull with the ROM  Stiff LS Extremities:  No pedal edema.   Neurologic:  Strait leg elev (-) B DTRs, MS OK Skin:  R thigh anter aspect AK x1 Psych:  Cognition and judgment appear intact. Alert and cooperative with normal attention span and concentration. No apparent delusions, illusions, hallucinations   Impression &  Recommendations:  Problem # 1:  CAD (ICD-414.00) Assessment Unchanged  His updated medication list for this problem includes:    Diovan 160 Mg Tabs (Valsartan) ..... Once daily    Aspirin Ec 81 Mg Tbec (Aspirin) ..... Once daily    Coreg 25 Mg Tabs (Carvedilol) .Marland Kitchen... 1 tab two times a day    Lasix 40 Mg Tabs (Furosemide) .Marland Kitchen... As needed  Problem # 2:  HYPERTENSION (ICD-401.9) Assessment: Unchanged  His updated medication list for this problem includes:    Diovan 160 Mg Tabs (Valsartan) ..... Once daily    Coreg 25 Mg Tabs (Carvedilol) .Marland Kitchen... 1 tab two times a day    Lasix 40 Mg Tabs (Furosemide) .Marland Kitchen... As needed  BP today: 120/68 Prior BP: 140/80 (01/19/2010)  Labs Reviewed: K+: 4.4 (03/18/2010) Creat: : 0.9 (03/18/2010)   Chol: 113 (03/18/2010)   HDL: 28.60 (03/18/2010)   LDL: 62 (03/18/2010)   TG: 113.0 (03/18/2010)  Problem # 3:  PERIPHERAL VASCULAR DISEASE (ICD-443.9) carotid stenosis B Assessment: Unchanged Carot Doppler is pending in 1/12  Problem # 4:  HYPERGLYCEMIA (ICD-790.29) Assessment: Comment Only The labs were reviewed with the patient.   Complete Medication List: 1)  Vytorin 10-80 Mg Tabs (Ezetimibe-simvastatin) .... Once daily 2)  Protonix 40 Mg Tbec (Pantoprazole sodium) .... Once daily brand medically necessary 3)  Diovan 160 Mg Tabs (Valsartan) .... Once daily 4)  Aspirin Ec 81 Mg Tbec (Aspirin) .... Once daily 5)  Coreg 25 Mg Tabs (Carvedilol) .Marland Kitchen.. 1 tab two times a day 6)  Niaspan 500 Mg Cr-tabs (Niacin (antihyperlipidemic)) .... Take 3 tablets daily 7)  Klor-con 20 Meq Pack (Potassium chloride) .... As needed 8)  Lasix 40 Mg Tabs (Furosemide) .... As needed 9)  Xyzal 5 Mg Tabs (Levocetirizine dihydrochloride) .... Once daily 10)  Vitamin D3 1000 Unit Tabs (Cholecalciferol) .Marland Kitchen.. 1 by mouth daily 11)  Tramadol Hcl 50 Mg Tabs (Tramadol hcl) .Marland Kitchen.. 1-2 tabs by mouth two times a day as needed pain 12)  Ibuprofen 600 Mg Tabs (Ibuprofen) .Marland Kitchen.. 1 by mouth bid   pc x 1 wk then as needed for  pain  Other Orders: Admin 1st Vaccine (84132) Flu Vaccine 27yrs + (44010)  Patient Instructions: 1)  Please schedule a follow-up appointment in 3 months. 2)  BMP prior to visit, ICD-9: 3)  HbgA1C prior to visit, ICD-9:790.29  995.20 .lbflu   Flu Vaccine Consent Questions     Do you have a history of severe allergic reactions to this vaccine? no    Any prior history of allergic reactions to egg and/or gelatin? no    Do you have a sensitivity to the preservative Thimersol? no    Do you have a past history of Guillan-Barre Syndrome? no    Do you currently have an acute febrile illness? no    Have you ever had a severe reaction to latex? no    Vaccine information given and explained to patient? yes  Are you currently pregnant? no    Lot Number:[id number]   Exp Date:01/01/2010   Site Given  Left Deltoid IM Lanier Prude, [facility])  March 23, 2010 8:27 AM

## 2010-08-06 NOTE — Progress Notes (Signed)
Summary: [facility]  Phone Note From Pharmacy   Caller: [facility] [phone] Summary of Call: [facility] is requesting a call back regarding Vytorin and Coreg. Please use ref# [id number] Initial call taken by: Rock Nephew CMA,  February 09, 2010 3:43 PM  Follow-up for Phone Call        This is most likely due to the new reccomendations regarding simvastatin. Please advise if you would like to change the medication. Follow-up by: Lamar Sprinkles, CMA,  February 09, 2010 4:49 PM  Additional Follow-up for Phone Call Additional follow up Details #1::        OK to change Vytorin to 10-40 Additional Follow-up by: Tresa Garter MD,  February 10, 2010 1:36 PM    Additional Follow-up for Phone Call Additional follow up Details #2::    Spoke w/[facility], they only needed refills, gave verbal for refill as previously written.  Follow-up by: Lamar Sprinkles, CMA,  February 10, 2010 2:14 PM  Prescriptions: COREG 25 MG  TABS (CARVEDILOL) 1 tab two times a day  #180 x 3   Entered by:   Lamar Sprinkles, CMA   Authorized by:   Tresa Garter MD   Signed by:   Lamar Sprinkles, CMA on 02/10/2010   Method used:   Telephoned to ...       [facility] (mail-order)             , [place]         Ph:        Fax: [phone]   RxID:   [id number] VYTORIN 10-80 MG  TABS (EZETIMIBE-SIMVASTATIN) once daily  #90 x 3   Entered by:   Lamar Sprinkles, CMA   Authorized by:   Tresa Garter MD   Signed by:   Lamar Sprinkles, CMA on 02/10/2010   Method used:   Telephoned to ...       [facility] (mail-order)             , [place]         Ph:        Fax: [phone]   RxID:   [id number]

## 2010-08-06 NOTE — Assessment & Plan Note (Signed)
Summary: 6 mos f/u $50 cd   Vital Signs:  Patient profile:   55 year old male Weight:      249 pounds Temp:     98.1 degrees F oral Pulse rate:   67 / minute BP sitting:   124 / 70  (left arm)  Vitals Entered By: Tora Perches (July 11, 2009 10:54 AM) CC: f/u Is Patient Diabetic? No   Primary Care Provider:  Tresa Garter MD  CC:  f/u.  History of Present Illness: The patient presents for a follow up of hypertension, CAD, hyperlipidemia   Preventive Screening-Counseling & Management  Alcohol-Tobacco     Smoking Status: never  Current Medications (verified): 1)  Vytorin 10-80 Mg  Tabs (Ezetimibe-Simvastatin) .... Once Daily 2)  Protonix 40 Mg  Tbec (Pantoprazole Sodium) .... Once Daily 3)  Diovan 160 Mg  Tabs (Valsartan) .... Once Daily 4)  Aspirin Ec 81 Mg  Tbec (Aspirin) .... Once Daily 5)  Coreg 25 Mg  Tabs (Carvedilol) .Marland Kitchen.. 1 Tab Two Times A Day 6)  Niaspan 1000 Mg  Tbcr (Niacin (Antihyperlipidemic)) .Marland Kitchen.. 1 1/2 Tabs Once Daily 7)  Klor-Con 20 Meq  Pack (Potassium Chloride) .... As Needed 8)  Lasix 40 Mg  Tabs (Furosemide) .... As Needed 9)  Xyzal 5 Mg Tabs (Levocetirizine Dihydrochloride) .... Once Daily 10)  Vitamin D3 1000 Unit  Tabs (Cholecalciferol) .Marland Kitchen.. 1 By Mouth Daily  Allergies (verified): No Known Drug Allergies  Past History:  Past Medical History: Last updated: 01/15/2009 Hypertension Peripheral vascular disease     Dr. Georganna Skeans Transient ischemic attack, hx of heart murmur.Marland Kitchenaortic valve sclerosis CAD CABG July 2003  .... nuclear scan 2005 Ejection fraction 40%    does not need ICD....  echo 2006 Hyperlipidemia... low HDL Prior cigarette use stop Sleep evaluation by Dr. Shelle Iron.. recommendation to lose weight and consider sleep study later Volume overload  Social History: Last updated: 05/01/2008 Married Never Smoked Alcohol use-no Drug use-no Regular exercise-no  Review of Systems       The patient complains of weight gain.   The patient denies fever, chest pain, syncope, dyspnea on exertion, and abdominal pain.    Physical Exam  General:  alert, well-developed, well-nourished, well-hydrated, appropriate dress, normal appearance, and overweight-appearing.   Nose:  External nasal examination shows no deformity or inflammation. Nasal mucosa are pink and moist without lesions or exudates. Mouth:  Oral mucosa and oropharynx without lesions or exudates.  Teeth in good repair. Neck:  supple, full ROM, no masses, and no thyromegaly.   Lungs:  Normal respiratory effort, chest expands symmetrically. Lungs are clear to auscultation, no crackles or wheezes. Heart:  Normal rate and regular rhythm. S1 and S2 normal without gallop, murmur, click, rub or other extra sounds. Abdomen:  soft and non-tender.   Neurologic:  No cranial nerve deficits noted. Station and gait are normal. Plantar reflexes are down-going bilaterally. DTRs are symmetrical throughout. Sensory, motor and coordinative functions appear intact. Skin:  turgor normal, color normal, no rashes, no suspicious lesions, no ecchymoses, no petechiae, no purpura, no ulcerations, and no edema.   Psych:  Cognition and judgment appear intact. Alert and cooperative with normal attention span and concentration. No apparent delusions, illusions, hallucinations   Impression & Recommendations:  Problem # 1:  CAD (ICD-414.00) Assessment Unchanged  His updated medication list for this problem includes:    Diovan 160 Mg Tabs (Valsartan) ..... Once daily    Aspirin Ec 81 Mg Tbec (Aspirin) .Marland KitchenMarland KitchenMarland KitchenMarland Kitchen  Once daily    Coreg 25 Mg Tabs (Carvedilol) .Marland Kitchen... 1 tab two times a day    Lasix 40 Mg Tabs (Furosemide) .Marland Kitchen... As needed  Problem # 2:  HYPERTENSION (ICD-401.9) Assessment: Unchanged  His updated medication list for this problem includes:    Diovan 160 Mg Tabs (Valsartan) ..... Once daily    Coreg 25 Mg Tabs (Carvedilol) .Marland Kitchen... 1 tab two times a day    Lasix 40 Mg Tabs (Furosemide)  .Marland Kitchen... As needed  BP today: 124/70 Prior BP: 122/70 (01/16/2009)  Labs Reviewed: K+: 4.1 (07/09/2009) Creat: : 1.0 (07/09/2009)   Chol: 103 (01/01/2009)   HDL: 29.20 (01/01/2009)   LDL: 58 (01/01/2009)   TG: 81.0 (01/01/2009)  Problem # 3:  HYPERGLYCEMIA (ICD-790.29) Assessment: Deteriorated See "Patient Instructions".   Problem # 4:  PERIPHERAL VASCULAR DISEASE (ICD-443.9) Assessment: Comment Only On prescription therapy   Complete Medication List: 1)  Vytorin 10-80 Mg Tabs (Ezetimibe-simvastatin) .... Once daily 2)  Protonix 40 Mg Tbec (Pantoprazole sodium) .... Once daily 3)  Diovan 160 Mg Tabs (Valsartan) .... Once daily 4)  Aspirin Ec 81 Mg Tbec (Aspirin) .... Once daily 5)  Coreg 25 Mg Tabs (Carvedilol) .Marland Kitchen.. 1 tab two times a day 6)  Niaspan 1000 Mg Tbcr (Niacin (antihyperlipidemic)) .Marland Kitchen.. 1 1/2 tabs once daily 7)  Klor-con 20 Meq Pack (Potassium chloride) .... As needed 8)  Lasix 40 Mg Tabs (Furosemide) .... As needed 9)  Xyzal 5 Mg Tabs (Levocetirizine dihydrochloride) .... Once daily 10)  Vitamin D3 1000 Unit Tabs (Cholecalciferol) .Marland Kitchen.. 1 by mouth daily  Patient Instructions: 1)  Try to eat more raw plant food, fresh and dry fruit, raw almonds, leafy vegetables, whole foods and less red meat, less animal fat. Poultry and fish is better for you than pork and beef. Avoid processed foods (canned soups, hot dogs, sausage, bacon , frozen dinners). Avoid corn syrup, high fructose syrup or aspartam and Splenda  containing drinks. Honey, Agave and Stevia are better sweeteners. Make your own  dressing with olive oil, wine vinegar, lemon juce, garlic etc. for your salads. 2)  It is important that you exercise regularly at least 20 minutes 5 times a week. If you develop chest pain, have severe difficulty breathing, or feel very tired , stop exercising immediately and seek medical attention. 3)  Please schedule a follow-up appointment in 6 months well w/labs and A1c.

## 2010-08-06 NOTE — Letter (Signed)
Summary: Generic Letter  [facility], Main Office  [address]   [place], [place] [address]   Phone: [phone]  Fax: [phone]        February 19, 2010 MRN: [id number]    [name] [address] [place], [place]  [address]    Dear Mr. Ennis,  We have been unable to reach you by phone regarding your test results.  Please give our office a call to receive your results.     Sincerely,  Meredith Staggers, RN Willa Rough, MD  This letter has been electronically signed by your physician.

## 2010-08-06 NOTE — Progress Notes (Signed)
Summary: Protonix  Phone Note Other Incoming   Summary of Call: I rec'd fax from [facility] that generic protonix is not covered, will require PA but Brand Name Protonix is covered without PA. I called patient to discuss if he needed the generic/if brand was ok and left message with patient spouse for patient to call back to office. Initial call taken by: Lucious Groves,  July 16, 2009 12:11 PM  Follow-up for Phone Call        No response from patient, called again, left message on machine to call back to office. Follow-up by: Lucious Groves,  July 18, 2009 9:30 AM  Additional Follow-up for Phone Call Additional follow up Details #1::        Spoke with pt, he would like brand name Additional Follow-up by: Lamar Sprinkles, CMA,  July 18, 2009 3:01 PM    New/Updated Medications: PROTONIX 40 MG  TBEC (PANTOPRAZOLE SODIUM) once daily Brand Medically Necessary [BMN] Prescriptions: PROTONIX 40 MG  TBEC (PANTOPRAZOLE SODIUM) once daily Brand Medically Necessary Brand medically necessary #90 x 3   Entered by:   Lamar Sprinkles, CMA   Authorized by:   Tresa Garter MD   Signed by:   Lamar Sprinkles, CMA on 07/18/2009   Method used:   Faxed to ...       [facility] (mail-order)             , [place]         Ph:        Fax: [phone]   RxID:   [id number]

## 2010-08-06 NOTE — Assessment & Plan Note (Signed)
Summary: leg pain x 2 wks /SD   Vital Signs:  Patient profile:   55 year old male Weight:      252 pounds Temp:     99.6 degrees F oral Pulse rate:   66 / minute BP sitting:   126 / 64  (left arm)  Vitals Entered By: Tora Perches (September 30, 2009 11:03 AM) CC: right leg pain x2wks Is Patient Diabetic? No   Primary Care Provider:  Tresa Garter MD  CC:  right leg pain x2wks.  History of Present Illness: C/o pain in the back of R leg 5-6/10 constant, dull x 1-2 wks. No numbness or swelling. Took Advil - helped some  Preventive Screening-Counseling & Management  Alcohol-Tobacco     Smoking Status: never  Current Medications (verified): 1)  Vytorin 10-80 Mg  Tabs (Ezetimibe-Simvastatin) .... Once Daily 2)  Protonix 40 Mg  Tbec (Pantoprazole Sodium) .... Once Daily Brand Medically Necessary 3)  Diovan 160 Mg  Tabs (Valsartan) .... Once Daily 4)  Aspirin Ec 81 Mg  Tbec (Aspirin) .... Once Daily 5)  Coreg 25 Mg  Tabs (Carvedilol) .Marland Kitchen.. 1 Tab Two Times A Day 6)  Niaspan 1000 Mg  Tbcr (Niacin (Antihyperlipidemic)) .Marland Kitchen.. 1 1/2 Tabs Once Daily 7)  Klor-Con 20 Meq  Pack (Potassium Chloride) .... As Needed 8)  Lasix 40 Mg  Tabs (Furosemide) .... As Needed 9)  Xyzal 5 Mg Tabs (Levocetirizine Dihydrochloride) .... Once Daily 10)  Vitamin D3 1000 Unit  Tabs (Cholecalciferol) .Marland Kitchen.. 1 By Mouth Daily  Allergies (verified): No Known Drug Allergies  Past History:  Past Medical History: Last updated: 01/15/2009 Hypertension Peripheral vascular disease     Dr. Georganna Skeans Transient ischemic attack, hx of heart murmur.Marland Kitchenaortic valve sclerosis CAD CABG July 2003  .... nuclear scan 2005 Ejection fraction 40%    does not need ICD....  echo 2006 Hyperlipidemia... low HDL Prior cigarette use stop Sleep evaluation by Dr. Shelle Iron.. recommendation to lose weight and consider sleep study later Volume overload  Social History: Last updated: 05/01/2008 Married Never Smoked Alcohol  use-no Drug use-no Regular exercise-no  Review of Systems       The patient complains of weight gain.  The patient denies fever, chest pain, and dyspnea on exertion.    Physical Exam  General:  alert, well-developed, well-nourished, well-hydrated, appropriate dress, normal appearance, and overweight-appearing.   Mouth:  Oral mucosa and oropharynx without lesions or exudates.  Teeth in good repair. Lungs:  Normal respiratory effort, chest expands symmetrically. Lungs are clear to auscultation, no crackles or wheezes. Heart:  Normal rate and regular rhythm. S1 and S2 normal without gallop, murmur, click, rub or other extra sounds. Abdomen:  Obese, soft and non-tender.   Msk:  Lumbar-sacral spine is tender to palpation over paraspinal muscles and painfull with the ROM  Stiff LS Extremities:  trace left pedal edema and trace right pedal edema.   Neurologic:  Strait leg elev (-) B DTRs, MS OK Decr sens over R lat thigh Skin:  Rash on B front mid-shins   Impression & Recommendations:  Problem # 1:  LEG PAIN (ICD-729.5) postero-lat R - poss schiatica vs pre-shingles vs other Assessment New See "Patient Instructions". Tramadol/Ibuprofen. Xray if not better The office visit took longer than 20 min with patient councelling for more than 50% of the 20 min   Problem # 2:  EDEMA (ICD-782.3) not new Assessment: Unchanged Calves NT His updated medication list for this problem includes:  Lasix 40 Mg Tabs (Furosemide) .Marland Kitchen... As needed  Problem # 3:  SKIN RASH (ICD-782.1) B shins Assessment: Unchanged  Seeing a dermatologist  Complete Medication List: 1)  Vytorin 10-80 Mg Tabs (Ezetimibe-simvastatin) .... Once daily 2)  Protonix 40 Mg Tbec (Pantoprazole sodium) .... Once daily brand medically necessary 3)  Diovan 160 Mg Tabs (Valsartan) .... Once daily 4)  Aspirin Ec 81 Mg Tbec (Aspirin) .... Once daily 5)  Coreg 25 Mg Tabs (Carvedilol) .Marland Kitchen.. 1 tab two times a day 6)  Niaspan 1000 Mg  Tbcr (Niacin (antihyperlipidemic)) .Marland Kitchen.. 1 1/2 tabs once daily 7)  Klor-con 20 Meq Pack (Potassium chloride) .... As needed 8)  Lasix 40 Mg Tabs (Furosemide) .... As needed 9)  Xyzal 5 Mg Tabs (Levocetirizine dihydrochloride) .... Once daily 10)  Vitamin D3 1000 Unit Tabs (Cholecalciferol) .Marland Kitchen.. 1 by mouth daily 11)  Acyclovir 800 Mg Tabs (Acyclovir) .Marland Kitchen.. 1 by mouth 5 times a day 12)  Tramadol Hcl 50 Mg Tabs (Tramadol hcl) .Marland Kitchen.. 1-2 tabs by mouth two times a day as needed pain 13)  Ibuprofen 600 Mg Tabs (Ibuprofen) .Marland Kitchen.. 1 by mouth bid  pc x 1 wk then as needed for  pain  Patient Instructions: 1)  Use stretching exercises that I have provided (15 min. or longer every day) 2)  Wallet to a front pocket 3)  If rash - take Acyclovir 4)  Call if you are not better in a reasonable amount of time or if worse.  5)  Please schedule a follow-up appointment in 2-3 weeks. Prescriptions: IBUPROFEN 600 MG TABS (IBUPROFEN) 1 by mouth bid  pc x 1 wk then as needed for  pain  #60 x 3   Entered and Authorized by:   Tresa Garter MD   Signed by:   Tresa Garter MD on 09/30/2009   Method used:   Print then Give to Patient   RxID:   [id number] TRAMADOL HCL 50 MG TABS (TRAMADOL HCL) 1-2 tabs by mouth two times a day as needed pain  #120 x 3   Entered and Authorized by:   Tresa Garter MD   Signed by:   Tresa Garter MD on 09/30/2009   Method used:   Print then Give to Patient   RxID:   [id number] ACYCLOVIR 800 MG TABS (ACYCLOVIR) 1 by mouth 5 times a day  #35 x 1   Entered and Authorized by:   Tresa Garter MD   Signed by:   Tresa Garter MD on 09/30/2009   Method used:   Print then Give to Patient   RxID:   [id number] IBUPROFEN 600 MG TABS (IBUPROFEN) 1 by mouth bid  pc x 1 wk then as needed for  pain  #60 x 3   Entered and Authorized by:   Tresa Garter MD   Signed by:   Tresa Garter MD on 09/30/2009   Method used:   Electronically to         [facility] (retail)       [address]       [place], [place]  [address]       Ph: [phone]       Fax: [phone]   RxID:   [id number] TRAMADOL HCL 50 MG TABS (TRAMADOL HCL) 1-2 tabs by mouth two times a day as needed pain  #120 x 3   Entered and Authorized by:   Tresa Garter MD   Signed  by:   Tresa Garter MD on 09/30/2009   Method used:   Electronically to        [facility] (retail)       [address]       [place], [place]  [address]       Ph: [phone]       Fax: [phone]   RxID:   [id number] ACYCLOVIR 800 MG TABS (ACYCLOVIR) 1 by mouth 5 times a day  #35 x 1   Entered and Authorized by:   Tresa Garter MD   Signed by:   Tresa Garter MD on 09/30/2009   Method used:   Electronically to        [facility] (retail)       [address]       [place], [place]  [address]       Ph: [phone]       Fax: [phone]   RxID:   [id number]

## 2010-08-07 ENCOUNTER — Other Ambulatory Visit: Payer: Self-pay

## 2010-08-07 ENCOUNTER — Ambulatory Visit: Admit: 2010-08-07 | Payer: Self-pay | Admitting: Internal Medicine

## 2010-08-14 ENCOUNTER — Ambulatory Visit: Payer: Self-pay | Admitting: Internal Medicine

## 2010-09-04 ENCOUNTER — Ambulatory Visit: Payer: Self-pay | Admitting: Internal Medicine

## 2010-09-04 DIAGNOSIS — Z0289 Encounter for other administrative examinations: Secondary | ICD-10-CM

## 2010-11-17 NOTE — Assessment & Plan Note (Signed)
[facility]                            CARDIOLOGY OFFICE NOTE   [name], [name]                       MRN:          [id number]  DATE:01/11/2007                            DOB:          Aug 08, 1955    This is a very pleasant 55 year old married white male patient of Dr.  Myrtis Ser who has a history of coronary artery disease status post CABG in  July 2003. He has LV dysfunction with an ejection fraction of 40%. The  patient was recently getting a physical for life insurance and the nurse  noted that he had some skipping when she took his pulse and told him  that he should get checked out so he is here today. He said that he has  taken his pulse on a couple of occasions and noticed that it was  skipping. He became a little bit concerned. He does not feel any  palpitations, dizziness, or pre-syncope with this. He has also had a  cold recently with coughing up a lot of phlegm, and had one episode of a  little tightness during a coughing spell, but this has all resolved. 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 He has had some energy drinks recently,  but does not know which kind, as it was a generic brand. He has not had  any irregular pulses in the past week or so.   CURRENT MEDICATIONS:  1. Vytorin 10/80 mg daily.  2. Protonix 40 mg daily.  3. Diovan 160 mg daily.  4. Aspirin 81 mg daily.  5. Coreg 25 mg b.i.d.  6. Niaspan 1000 mg daily.   PHYSICAL EXAMINATION:  This is a very pleasant 55 year old white male in  no acute distress.  Blood pressure 118/68, pulse 65, weight 239.  NECK:  Loud bilateral carotid bruits, no JVD or HJR.  LUNGS:  Clear anterior, posterior, and lateral.  HEART:  Regular rate and rhythm at 65 beats-per-minute. Normal S1 and  S2. No murmurs, rubs, bruit, thrill or heave noted.  ABDOMEN:  Soft  without organomegaly, masses, lesions, or abnormal  tenderness.  EXTREMITIES:  Without cyanosis, clubbing, or edema. He has good distal  pulses   EKG normal sinus rhythm, inferior Q-waves, old infarct. There is no  ectopy on his EKG.   IMPRESSION:  1. Occasional skipping pulse per [employer]. Asymptomatic.  2. Coronary artery disease status post CABG in July 2003.  3. LV dysfunction with an ejection fraction of 40%.  4. Peripheral vascular disease with loud bilateral carotid bruits      followed by Dr. Madilyn Fireman.  5. Hyperlipidemia, treated.  6. Prior smoker.  7. Slight history of volume overload in the past treated with p.r.n.      Lasix, he has not used recently.   PLAN:  I believe that the patient is stable. He is asymptomatic with the  occasional skips that he had. We will check a BMET and TSH today just to  make sure these are stable and he will follow up with Dr. Myrtis Ser in  October. He is to call if he has any more symptoms in the interim.      Lucas Reedy, PA-C  Electronically Signed      Jonelle Sidle, MD  Electronically Signed   ML/MedQ  DD: 01/11/2007  DT: 01/12/2007  Job #: [id number]

## 2010-11-17 NOTE — Assessment & Plan Note (Signed)
[facility]                            CARDIOLOGY OFFICE NOTE   ROBERTH, BERLING                       MRN:          [id number]  DATE:04/21/2007                            DOB:          1955-10-28    [name] is actually doing quite well.  He is not having any chest  pain or significant shortness of breath.  He mentions that he is having  a fullness in his abdomen after he eats.  He does have some reflux.  He  is not having any pain.  He also mentions that his wife says that his  snoring is getting worse.  It will be appropriate to send him for a  sleep study.  We know that his TSH is normal.  We will get a full sleep  evaluation by our pulmonary team.   The patient has known coronary disease and he does have left ventricular  dysfunction, and he has been stable.   PAST MEDICAL HISTORY:   ALLERGIES:  No known drug allergies.   MEDICATIONS:  1. Vytorin 10/40.  2. Protonix 40.  3. Diovan 160.  4. Aspirin 81.  5. Coreg 25 b.i.d.  6. Niaspan 1 g.   OTHER MEDICAL PROBLEMS:  See the list below.   REVIEW OF SYSTEMS:  Other than the HPI, his review of systems is  negative.   PHYSICAL EXAM:  Weight is 235 pounds.  This is not excessive for him  historically, but he definitely needs to lose weight.  Blood pressure  122/70 with a pulse of 60.  The patient is oriented to person, time, and place.  Affect is normal.  HEENT:  Reveals no xanthelasma.  He has normal extraocular motions.  There are no carotid bruits.  There is no jugular venous distension.  LUNGS:  Clear.  Respiratory effort is not labored.  CARDIAC:  Reveals an S1 with an S2.  There are no clicks or significant  murmurs.  ABDOMEN:  Reveals normal bowel sounds.  He has no peripheral edema.   EKG:  PVCs.  He does have sinus rhythm.   PROBLEMS:  1. Coronary artery disease post CABG in July 2003.  2. Left ventricular dysfunction, ejection fraction 40%.  He is on      appropriate  meds.  3. Some tingling in his arms historically that is not cardiac.  4. Heart Failure Action Trial that is now complete.  5. Peripheral vascular disease followed by Dr. Madilyn Fireman.  6. Hyperlipidemia.  We will obtain his fasting lipid profile today and      then make a decision about his Niaspan and other dosing.  7. History of cigarette use, but he quit in the past.  8. Slight volume overload.  This responds to intermittent Lasix and he      has not required any lately.  9. Some abdominal bloating after eating.  I have encouraged him to try      increasing his Protonix to twice a day for a week, and I have      encouraged him to try to lose some  weight, and to be very attentive      to antireflux measures, and to see if this helps.  If it does not      help, further GI evaluation could be considered.  10.Increased snoring.  I had a full discussion with [name] about      the effects of sleep apnea if he has it.  It is important from a      cardiovascular viewpoint to assess this further.  We will arrange      for a sleep evaluation by our sleep team and see what they think.      I will see him back for cardiology followup in 6 months.     [name], MD, [facility]  Electronically Signed    JDK/MedQ  DD: 04/21/2007  DT: 04/22/2007  Job #: [id number]

## 2010-11-17 NOTE — Assessment & Plan Note (Signed)
[facility]                            CARDIOLOGY OFFICE NOTE   SULLIVAN, JACUINDE                       MRN:          [id number]  DATE:11/22/2007                            DOB:          11/26/1955    Mr. Sippel is seen for cardiology follow-up.  He has known coronary  disease that is significant.  He is not having any chest pain.  He has  no significant shortness of breath.  When I saw him on April 21, 2007,  we talked about his snoring and I have referred him to Dr. Shelle Iron for  sleep evaluation.  Dr. Teddy Spike note suggests that the patient try to  lose some weight and that consideration would be given over time from  sleep study if the patient was having more difficulties.  I have once  again reminded the patient that weight loss will be very important.   He is not having any significant chest pain.  He is going about full  activities.  We have been pushing his Niaspan.  He had some improvement  in his HDL for a while and in October 2008, the HDL at not improved any  further.  We will check labs in the near future.   PAST MEDICAL HISTORY:   ALLERGIES:  No known drug allergies.   MEDICATIONS:  Vytorin 10/80, Protonix, Diovan, aspirin, Coreg and  Niaspan 1 gram daily.   OTHER MEDICAL PROBLEMS:  See the complete list on my note of April 21, 2007.   REVIEW OF SYSTEMS:  Review of systems is negative.  He has no complaints  at this time.   PHYSICAL EXAMINATION:  VITAL SIGNS:  Weight is 239 pounds.  This is  stable for him.  He has not lost significant weight.  Blood pressure is  115/68 with a pulse of 64.  GENERAL APPEARANCE:  The patient is oriented to person, time and place.  Affect is normal.  HEENT:  No xanthelasma.  He has normal extraocular motion.  NECK:  He has a soft right carotid bruit.  There is no jugular venous  distention.  LUNGS:  Clear.  Respiratory effort is not labored.  CARDIAC:  Reveals an S1 with an S2.  No clicks  or significant murmurs.  ABDOMEN:  Soft.  No peripheral edema.   EKG today is unchanged.   Lipids dated April 21, 2007, revealed that his LDL was 52.  Triglycerides 59.  Total cholesterol 97.  The HDL remains low at 28.  There will be no change in the approach to this therapy at this time.   PROBLEM LIST:  Problems are listed completely on my note of April 21, 2007.  1. Coronary disease is stable.  Peripheral vascular disease is      followed by Dr. Madilyn Fireman.  We will check for him and see when his      follow-up is to be.  2. Hyperlipidemia.  He does need a follow-up fasting lipid.   I will see him back in 6 months.     [name], MD, [facility]  Electronically Signed    JDK/MedQ  DD: 11/22/2007  DT: 11/22/2007  Job #: [id number]   cc:   Balinda Quails, M.D.

## 2010-11-17 NOTE — Assessment & Plan Note (Signed)
[name]                             PULMONARY OFFICE NOTE   [name], [name]                       MRN:          [id number]  DATE:04/28/2007                            DOB:          Jul 06, 1955    HISTORY OF PRESENT ILLNESS:  The patient is a 55 year old male who I  have been asked to see for possible obstructive sleep apnea.  The  patient states that he has been told that he has loud snoring, but no  one has ever mentioned pauses in his breathing during sleep.  He denies  choking arousals.  He was having great difficulty with cough at night as  well as early satiety and was placed on an aggressive reflux regimen.  His nocturnal cough is much improved since being on this.  The patient  typically goes to bed between 9 and 10 p.m. and gets up at 4:30 a.m.  He  is rested about 50% to 60% of the time.  The patient works in Education officer, [employer] and has some sleep pressure during the day with periods of  inactivity, but he really does not feel that his alertness or efficiency  at work is being affected by this.  He does not doze with TV or movies  in the evening, and has extremely rare episodes of sleepiness with  driving.  Of note, his weight is up about 30 pounds over the last 2  years.   PAST MEDICAL HISTORY:  1. Hypertension.  2. History of angina and myocardial infarction with coronary artery      bypass grafting in 2002.  3. History of dyslipidemia.  4. Status post cholecystectomy.   CURRENT MEDICATIONS:  1. Vytorin 10/80 daily.  2. Protonix 40 mg b.i.d.  3. Diovan 160 daily.  4. Enteric-coated aspirin 81 mg daily.  5. Coreg 25 mg b.i.d.  6. Multivitamin daily.  7. Niaspan 1000 daily.  8. Lasix p.r.n.  9. K-Dur p.r.n.   The patient has no known drug allergies.   SOCIAL HISTORY:  He has a history of smoking 1 pack per day for 28  years.  He has not smoked since 2002.  He is married and has children.   FAMILY HISTORY:  Remarkable  for his father having had heart disease as  well as lung cancer and prostate cancer.   REVIEW OF SYSTEMS:  As per history of present illness, also see patient  intake form documented in the chart.   PHYSICAL EXAMINATION:  IN GENERAL:  He is an overweight male in no acute  distress.  Blood pressure is 130/76, pulse 42, temperature is 98.3, weight is 235  pounds.  O2 saturation on room air is 96%.  HEENT:  Pupils are equal, round, and reactive to light and  accommodation.  Extraocular muscles are intact.  Nares are patent  without discharge.  Oropharynx shows mild elongation of the soft palate  and uvula.  NECK:  Supple without JVD or lymphadenopathy.  There is no palpable  thyromegaly.  CHEST:  Totally clear.  CARDIAC EXAM:  Reveals regular rate and rhythm,  no murmurs, rubs, or  gallops.  ABDOMEN:  Soft, nontender, with good bowel sounds.  GENITAL/RECTAL/BREAST EXAM:  Not done and not indicated.  LOWER EXTREMITIES:  Without edema.  Pulses are intact distally.  NEUROLOGICALLY:  Alert and oriented with no obvious motor deficits.   IMPRESSION:  Questionable obstructive sleep apnea.  The patient does  have some history that is suggestive of this; however, he feels that his  sleep is much improved since he has been on b.i.d. proton pump  inhibitor, and that his reflux has gotten better.  He really does not  have significant symptoms during the day, and does not feel that his  quality of life is being adversely affected.  He does understand that he  needs to work aggressively on weight loss for his overall health.  I  have had a long discussion with him about sleep apnea, including the  upper airway resistance syndrome as well as frank obstructive sleep  apnea which can result in increased cardiovascular risk, especially with  his past medical history.  We have discussed the possibility with  proceeding with nocturnal polysomnogram to make a firm diagnosis, or for  him to take the next 6  months and work aggressively on weight loss to  see if things will significantly improve.  After a long discussion, the  patient and I will both agree that he will take the next 6 months and  try and lose some weight.   PLAN:  The patient will take 6 months to work on weight loss.  He is to  call me if he feels that he is more symptomatic than what he has let on  during this visit.  If that is the case, we will set him up for  nocturnal polysomnogram.  Also, I asked him to call me to set up a sleep  study if he is not able to make progress over the next few months with  regards to weight loss.  If the patient has lost weight and feels that  he is doing much better and sleeping well, I will see him on a p.r.n.  basis.     Barbaraann Share, MD,FCCP  Electronically Signed    [facility]/MedQ  DD: 05/01/2007  DT: 05/01/2007  Job #: [id number]   cc:   [name], MD, [facility]

## 2010-11-17 NOTE — Assessment & Plan Note (Signed)
[facility]                            CARDIOLOGY OFFICE NOTE   [name], [name]                       MRN:          [id number]  DATE:05/14/2008                            DOB:          02-21-1956    [name] is stable.  He has coronary artery disease.  He is post CABG.  His ejection fraction has been in the 40% range.  I have carefully  assessed in the past and he does not need an ICD.  He has gained weight.  He and I had a long discussion about that.  He is exercising more.  He  needs to cut his calories back, but this is very difficult for him and  his type of work.  He will do the best he can.  He is not having any  chest pain or shortness of breath.  He has had no syncope or presyncope.   PAST MEDICAL HISTORY:   ALLERGIES:  No known drug allergies.   MEDICATIONS:  Vytorin, Protonix, Diovan, aspirin, Coreg, multivitamin,  and Niaspan.   OTHER MEDICAL PROBLEMS:  See the complete list on my note of April 21, 2007.   REVIEW OF SYSTEMS:  He is not having any GI or GU symptoms.  Not having  any fevers or chills.  Has no rashes.  His review of systems otherwise  is negative.   PHYSICAL EXAMINATION:  VITAL SIGNS:  Blood pressure is 124/73.  Pulse is  63.  Weight is 247 pounds.  GENERAL:  The patient is oriented to person, time, and place.  Affect is  normal.  HEENT:  No xanthelasma.  He has normal extraocular motion.  NECK:  There are no carotid bruits.  There is no jugular venous  distention.  LUNGS:  Clear.  Respiratory effort is not labored.  CARDIAC:  An S1 with an S2.  There are no clicks or significant murmurs.  ABDOMEN:  Soft.  He has no peripheral edema.   EKG reveals no significant change.   [name] coronary status is stable.  He has a low HDL.  He is on  Niaspan.  I would like to get him to a higher dose if we can.  I will  plan to discuss this with him.   Otherwise, no change in his meds and I will see him back in 6  months.     [name], MD, [facility]  Electronically Signed    JDK/MedQ  DD: 05/14/2008  DT: 05/15/2008  Job #: [id number]

## 2010-11-20 NOTE — Assessment & Plan Note (Signed)
[facility]                              CARDIOLOGY OFFICE NOTE   TIONNE, DAYHOFF                       MRN:          [id number]  DATE:05/05/2006                            DOB:          11-22-1955    Mr. [name] is seen for followup.  See my complete note of Nov 19, 2005.  He  is doing well.  He is on all the appropriate medications for LV dysfunction  and his coronary disease.  We will recheck his fasting lipids.  He is not  having any chest pain or shortness of breath.  He is doing very well.   PAST MEDICAL HISTORY:  Allergies:  No known drug allergies.   Medications:  1. Vytorin 10/80.  2. Protonix.  3. Diovan 160.  4. Aspirin 81.  5. Coreg 25 b.i.d.   Other medical problems:  See the list on my note of Nov 14, 2005.   REVIEW OF SYSTEMS:  He feels fine.  He had some slight back discomfort which  sounds musculoskeletal.  He is stable.  Otherwise, his review of systems is  negative.   PHYSICAL EXAMINATION:  The patient is well-developed, well-nourished.  He is  somewhat overweight and losing weight, of course, would be optimal.  Blood  pressure is 110/70.  His pulse is 60.  The patient is oriented to person,  time and place and his affect is normal.  LUNGS:  Clear.  Respiratory effort is not labored.  HEENT:  Reveals no xanthelasma.  He has normal extraocular motion.  I do not  hear carotid bruits.  There is no jugular venous distention.  CARDIAC:  Reveals an S1 with an S2.  There are no clicks or significant  murmurs.  ABDOMEN:  Soft.  He has no peripheral edema.   PROBLEMS:  Are listed on my note of Nov 19, 2005:   #1 - Coronary disease, stable.  #2 - Left ventricular dysfunction stable and on all the appropriate  medications.  #5 - Carotid disease.  This is followed carefully by Dr. Madilyn Fireman.  #6 - Hyperlipidemia.  It is time to recheck a fasting lipid and this will be  done.  I will be back in touch with him.    ______________________________  [name], MD, [facility]    JDK/MedQ  DD: 05/05/2006  DT: 05/05/2006  Job #: [id number]

## 2010-11-20 NOTE — Op Note (Signed)
[name], [name]                          ACCOUNT NO.:  [id number]   MEDICAL RECORD NO.:  [id number]                   PATIENT TYPE:  OBV   LOCATION:  8416                                 FACILITY:  [facility]   PHYSICIAN:  Adolph Pollack, M.D.            DATE OF BIRTH:  08/05/1955   DATE OF PROCEDURE:  02/20/2004  DATE OF DISCHARGE:                                 OPERATIVE REPORT   PREOPERATIVE DIAGNOSES:  Symptomatic cholelithiasis.   POSTOPERATIVE DIAGNOSES:  Cholelithiasis and chronic cholecystitis.   PROCEDURE:  Laparoscopic cholecystectomy, intraoperative cholangiogram.   SURGEON:  Adolph Pollack, M.D.   ANESTHESIA:  General.   INDICATIONS FOR PROCEDURE:  This 55 year old male has been having some  postprandial epigastric type discomfort that radiates around to his right  shoulder.  Evaluation demonstrated multiple gallstones without gallbladder  wall thickening.  His symptoms are consistent with biliary colic and now  presents for elective cholecystectomy.  Preoperative function tests are  normal. The procedure and risks were discussed with him preoperatively.   TECHNIQUE:  He is seen in the holding area and then brought to the operating  room and placed supine on the operating table and a general anesthetic was  administered. The hair was clipped off the abdominal wall and the abdominal  wall was sterilely prepped and draped. Dilute Marcaine solution was  infiltrated in a subumbilical region and a subumbilical incision was made  through the skin, subcutaneous tissue and fascia.  The peritoneal cavity was  entered under direct vision. A pursestring suture of #0 Vicryl was placed  around the fascial edges.  A Hasson trocar was introduced into the  peritoneal cavity and pneumoperitoneum was created by insufflation of CO2  gas.   Next a laparoscope was introduced and he was placed in the reverse  Trendelenburg position with the right side tilted slightly upward.  An 11 mm  trocar was placed through an epigastric incision and two 5 mm trocars were  placed in the right upper quadrant. The fundus of the gallbladder was  identified, grasped and retracted to the right shoulder. Minimal adhesions  were present. The infundibulum was then grasped and mobilized with careful  blunt dissection. I then isolated the cystic duct and created a window  around it. A clip was placed at the gallbladder cystic duct junction and an  incision made in the cystic duct. Bile was milked back through this.  A  cholangiocatheter was passed through the anterior abdominal wall, placed  into the cystic duct and a cholangiogram was performed.   Under real-time fluoroscopy, a dilute contrast material was injected into  the cystic duct which was of moderate length. The common hepatic, right and  left hepatic and common bile ducts all filled promptly and contrast ran into  the duodenum regularly without obvious evidence of obstruction.  Final  reports pending the radiologist's interpretation.   I then removed the Cholangiocath,  the cystic duct was clipped three times,  __________ inside and divided. I identified the cystic artery and created a  window around it. It was then clipped and divided. The gallbladder was then  dissected free from the liver bed intact using electrocautery. The  gallbladder fossa was irrigated and bleeding points were controlled with the  cautery.  I inspected this a number of times and noted no further bleeding.  There was no bile leak present.   The gallbladder was then removed through the subumbilical port and the  subumbilical fascial defect was closed under laparoscopic vision by  tightening up and tying down the pursestring suture. The perihepatic area  was visualized and irrigated and again no bleeding or bile leakage was  noted. The remaining trocars were removed and the pneumoperitoneum was  released.  The skin incisions were closed with 4-0  Monocryl subcuticular  stitches followed by Steri-Strips and sterile dressings.   He tolerated the procedure well without any apparent complications and was  taken to the recovery room in satisfactory condition.                                               Adolph Pollack, M.D.    Kari Baars  D:  02/20/2004  T:  02/21/2004  Job:  [id number]   cc:   Georgina Quint. Plotnikov, M.D. [facility]

## 2010-11-20 NOTE — Op Note (Signed)
[place]  Patient:    [name], [name] Visit Number: [id number] MRN: [id number]          Service Type: DSU Location: 2000 2021 01 Attending Physician:  Charlett Lango Dictated by:   Salvatore Decent. Dorris Fetch, M.D. Proc. Date: 01/26/02 Admit Date:  01/25/2002   CC:         [name], M.D. [facility]  Sonda Primes, M.D. [facility]   Operative Report  PREOPERATIVE DIAGNOSIS:  Three vessel coronary disease with positive exercise tolerance test.  POSTOPERATIVE DIAGNOSIS:  Three vessels coronary disease with positive exercise tolerance test.  PROCEDURES:  Median sternotomy, extracorporeal circulation, coronary artery bypass grafting x 4 (right internal mammary artery to left anterior descending, free left internal mammary artery to first diagonal, saphenous vein graft to obtuse marginal two, saphenous vein graft to posterior descending).  SURGEON:  Salvatore Decent. Dorris Fetch, M.D.  ASSISTANT:  Lissa Merlin, P.A.  ANESTHESIA:  General.  FINDINGS:  The left internal mammary artery was densely adherent to the third costal cartilage.  Laborious attempt to take down resulting in a tear in the vessel.  Only a short segment of the mammary artery was usable.  This was used as a free graft to the diagonal and the right internal mammary artery was used as a pedicle graft to the LAD.  Fair quality targets, except the posterior descending, which was poor, obtuse marginals one and three were too small to graft.  A large area of inferior scar.  Diffusely diseased coronaries.  No graftable vessels in the nondominant right.  CLINICAL NOTE:  [name] is a 55 year old gentleman with multiple cardiac risk factors.  He had been having some mild exertional shortness of breath and underwent a cardiac work-up.  He had a positive exercise tolerance test and was scheduled for catheterization.  Cardiac catheterization showed a left dominant circulation with 3-vessel  disease.  He also had impaired left ventricular function with an ejection fraction estimated at 35%.  The patient was referred for coronary artery bypass grafting, which was indicated for survival benefit and myocardial preservation.  The indications, risks, benefits, and alternative procedures were discussed in detail with the patient and his wife.  He understood and accepted the risks and agreed to proceed.  OPERATIVE NOTE:  [name] was brought to the preoperative holding area on January 26, 2002.  Lines were placed to monitor arterial, central venous and pulmonary arterial pressure.  EKG leads were placed for continuous telemetry. The patient was taken to the operating room, anesthetized and intubated.  A Foley catheter was placed and intravenous antibiotics were administered.  The chest, abdomen and legs were prepped and draped in the usual fashion.  A median sternotomy was performed.  Simultaneously, an incision was made in the medial aspect of the right leg and the greater saphenous vein was harvested from the right lower leg.  It was of good quality.  The left internal mammary artery was harvested and appeared to be a good quality vessel, however as the dissection was carried proximally along the chest wall, the mammary was densely adherent at the third costal cartilage.  A great deal of time was taken with laborious dissection in this area, including an attempt to take the perichondrium with the mammary artery using a 15-blade scalpel. During the dissection, a tear resulted in the mammary artery.  The patient had already been given 5000 units of heparin.  Bulldog clamps were placed proximal and distal to the tear and  the remainder of the dissection was performed.  The tear was not repairable.  The short segment of mammary that was available proximally was of good quality.  Next, the right internal mammary artery was harvested using the standard technique.  No difficulties were  encountered during harvest of the right mammary artery.  This was a large, long, good quality vessel and easily reached the distal LAD as a pedicle graft.  The remainder of the full heparin dose was administered prior to dividing the distal end of the right mammary artery.  The pericardium was opened.  The ascending aorta was inspected and palpated. The aorta was cannulated via concentric 2-0 Ethibond nonpledgeted purse-string sutures.  A dual-stage venous cannula was placed via a purse-string suture in the right atrial appendage.  Cardiopulmonary bypass was instituted and the patient was cooled to 32 degrees Celcius.  The coronary arteries were inspected and anastomotic sites were chosen.  Of note, all of the left circumflex branches were very small, obtuse marginals one and three were too small to graft, OM-2 was a fair quality, but small caliber vessel.  There was a large inferior scar.  The posterior descending was a very small vessel, but was acceptable for grafting.  There were no graftable targets in the nondominant right circulation.  The LAD and diagonal vessels were both diffusely diseased.  After choosing the anastomotic sites, the conduits were inspected and cut to length.  A foam pad was placed in the pericardium to protect the left phrenic nerve.  A temperature probe was placed in the myocardial septum and a cardioplegia cannula was placed in the ascending aorta.  The aorta was cross-clamped.  The left ventricle was emptied via the aortic root vent.  Cardiac arrest then was achieved with a combination of cold antegrade blood cardioplegia and topical ice saline.  A rapid diastolic arrest and excellent myocardial septal cooling were accomplished.  The following distal anastomoses were performed.  First, a reverse saphenous vein graft was placed end-to-side to the posterior descending.  This was a terminal branch of the dominant left circumflex  coronary.  It was 1 mm,  thin-walled, poor quality vessel.  The vein graft was of good quality.  The anastomosis was performed with a running 7-0 Prolene suture.  There was good flow through the graft.  Cardioplegia was administered.  There was a small leak on the side of the graft, which was repaired with a single 7-0 Prolene suture.  There was good hemostasis.  Next, a reverse saphenous vein graft was placed end-to-side to the second obtuse marginal branch of the left circumflex coronary artery.  This vessel arose just distal to a long segment stenosis in the body of the circumflex. OM-2 was a 1.2 mm fair quality target.  Again, the vein graft was of good quality, although, relatively small in caliber.  The anastomosis was performed end-to-side with a running 7-0 Prolene suture.  This anastomosis was probed proximally and distally, as were all the others prior to tying the suture. There was good flow through the graft.  Cardioplegia was administered and there was good hemostasis.  Next, the short segment of the left internal mammary artery was used as a free graft and it was anastomosed at its distal end-to-side to the first diagonal branch of the LAD.  The diagonal was diffusely diseased at 1.3 mm in diameter. The anastomosis was performed with a running 8-0 Prolene suture.  At the completion of the anastomosis, additional cardioplegia was administered down  the vein grafts and the aortic root.  Backflow was seen through the free mammary graft.  Next, the right mammary was brought through an incision in the pericardium. The distal end was spatulated and it was anastomosed end-to-side to the distal LAD.  Again, the LAD was also a diffusely diseased vessel.  It was 1.5 mm in diameter.  The mammary was a 2 mm good quality conduit with excellent flow. An end-to-side anastomosis was performed from the right mammary to the LAD using a running 8-0 Prolene suture.  At the completion of the anastomosis, the bulldog  clamp was removed from the right mammary artery and immediate and rapid septal rewarming was noted.  Lidocaine was administered.  The mammary pedicle was tacked to the epicardial surface of the heart with 6-0 Prolene sutures.  The aortic cross-clamp was removed.  Total cross-clamp time was 66 minutes.  The patient fibrillated and required a single defibrillation with 20 joules and then maintained a stable rhythm thereafter.  A partial occlusion clamp was placed on the ascending aorta.  The cardioplegia cannula was removed, the proximal vein graft anastomoses were performed to 4.0 mm punch aortotomies with running 6-0 Prolene sutures.  A side branch was excised from the obtuse marginal vein graft near the proximal anastomosis and the proximal anastomosis of the free left mammary graft was performed the vein at the side branch using a running 7-0 Prolene suture.  The patient was placed in Trendelenburg position, the anastomoses were deaired, the partial occlusion clamp was removed, and air was aspirated from the vein grafts.  All proximal and distal anastomoses were inspected for hemostasis.  Epicardial pacing wires were placed on the right ventricle and right atrium.  When the patient had been rewarmed to a core temperature of 37 degrees Celcius, a dopamine drip was initiated and the patient was weaned from cardiopulmonary bypass without difficulty.  The total bypass time was 164 minutes.  The patients initial cardiac index was greater than 2 L/min/sq.m and he remained hemodynamically stable throughout the postbypass period.  A test dose of protamine was administered and was well-tolerated.  The atrial and aortic cannulae were removed.  There was good hemostasis at both cannulation sites.  The remainder of the protamine was administered without incident.  The chest was irrigated with 1 L of warm normal saline containing 1 g of vancomycin.  Hemostasis was achieved.  The pericardium  was reapproximated with interrupted 3-0 silk sutures that came together easily without tension.  Bilateral pleural tubes and two mediastinal chest tubes were placed through separate subcostal incisions.  The sternum was closed with heavy-gauge interrupted stainless steel wires.  The remainder of the incisions in the chest and the leg were closed in a standard fashion with subcuticular skin closures.  All sponge, needle and instrument counts were correct at the end of the procedure.  The patient was taken from the operating room to the surgical intensive care unit intubated and in stable condition.  NOTE:  Would not consider redo bypass grafting, except for the LAD. Dictated by:   Salvatore Decent Dorris Fetch, M.D. Attending Physician:  Charlett Lango DD:  01/26/02 TD:  01/29/02 Job: [id number]

## 2010-11-20 NOTE — Cardiovascular Report (Signed)
[place]  Patient:    [name], [name] Visit Number: [id number] MRN: [id number]          Service Type: DSU Location: 2000 2021 01 Attending Physician:  Charlett Lango Dictated by:   Salvadore Farber, M.D., [facility] Proc. Date: 01/25/02 Admit Date:  01/25/2002   CC:         Theron Arista C. Eden Emms, M.D. [facility]   Cardiac Catheterization  PROCEDURES: Left heart catheterization, left ventriculogram, coronary angiography.  INDICATION: Positive exercise test with an inferior defect on stress imaging only.  DIAGNOSTIC TECHNIQUE: Under 2% lidocaine local anesthesia, a 6 French sheath was placed in the right femoral artery. The JR4 nd JL4 catheters were used. Angiography was performed by hand injection. A pigtail catheter was advanced in the left ventricle. Pressures were measured. Left ventriculography was performed by power injection. Following the procedure, the sheaths were removed.  FINDINGS: 1. LV: LVEDP equaled 19 mmHg after the injection of contrast. Ejection    fraction of 32% with inferior akinesis and mid anterior hypokinesis. 2. Left main: Normal. 3. LAD: There is a 70% stenosis just distal to a large diagonal branch.    This diagonal branch has a 40% proximal stenosis. 4. Circumflex: There is a 30% proximal lesion and a long 80% lesion    extending from just distal to the first obtuse marginal branch through    the origin of the second obtuse marginal branch. The circumflex is a    dominant vessel. 5. RCA: The vessel is a moderate sized but nondominant vessel. It is    totally occluded proximally with bridging collaterals that provide    faint flow. 6. There is no aortic stenosis on pullback. There is 1+ mitral regurgitation.  IMPRESSION: Three-vessel coronary artery disease with reduced left ventricular systolic function and elevated left ventricular end-diastolic pressure.  PLAN: With his lack of an anginal warning system and  substantial multivessel disease with reduced ejection fraction we will refer to CVTS for consideration of coronary artery bypass grafting. Dictated by:   Salvadore Farber, M.D., [facility] Attending Physician:  Charlett Lango DD:  01/25/02 TD:  01/30/02 Job: [id number]

## 2010-11-20 NOTE — Consult Note (Signed)
[place]  Patient:    [name], [name] Visit Number: [id number] MRN: [id number]          Service Type: DSU Location: 2000 2021 01 Attending Physician:  Charlett Lango Dictated by:   Salvatore Decent. Dorris Fetch, M.D. Proc. Date: 01/25/02 Admit Date:  01/25/2002   CC:         [name], M.D. [facility]  Sonda Primes, M.D. [facility]   Consultation Report  CARDIOVASCULAR AND THORACIC SURGERY CONSULTATION  CHIEF COMPLAINT:  "They found blockages in my coronaries."  HISTORY OF PRESENT ILLNESS:  The patient is a 55 year old gentleman who has a history of possible hypertension and history of hyperlipidemia.  He did have a stroke causing central visual defect in his right eye approximately one year ago.  He also is under a great deal of stress and has taken medication for that.  He smokes about one pack per day on average and has for his entire adult life.  The patient has noted some dyspnea on exertion and some tiredness and easy fatigability but really felt like he was in pretty good health overall.  He had not had any chest pain, tightness, palpitations, orthopnea, paroxysmal nocturnal dyspnea or dizziness.  He felt like he would really only get tired when it was very hot or he was working very heavily.  His mother died of a stroke and his father had a positive stress test and he felt he should be evaluated.  He saw Dr. Sonda Primes and had an echocardiogram done in March which showed impaired left ventricular function with an ejection fraction of 35 to 40% and inferior wall akinesis.  There was mild mitral and tricuspid regurgitation but otherwise was unremarkable.  He subsequently had a stress Cardiolite performed recently which showed severely diminished uptake in the inferior wall with ejection fraction calculated at 39%.  The patient was advised to have cardiac catheterization which was performed today which showed three vessel coronary disease  with impaired left ventricular function.  PAST MEDICAL HISTORY:  Stroke causing right visual field loss.  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  MEDICATIONS AT ADMISSION: 1. Zocor 20 mg p.o. q.d. 2. Enteric coated aspirin 325 mg p.o. q.d. 3. Aceon 2 mg p.o. q.d. 4. Wellbutrin SR 150 mg p.o. q.d. 5. Folic acid 1 mg p.o. q.d.  ALLERGIES:  No known allergies.  FAMILY HISTORY:  Significant for diabetes, peripheral vascular disease and coronary disease in his father.  His mother died of a stroke.  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  He has been under quite a bit of stress.  He is married. He has two daughters.  He smoked at least one pack per day for his entire adult life. He denies any alcohol use.  REVIEW OF SYSTEMS:  He has noted that he has less energy than usual.  He fatigues easily.  He has not had any significant weight changes. He has not noted any peripheral edema, paroxysmal nocturnal dyspnea or orthopnea. He has not had any new stroke or transient ischemic attack-like symptoms since the episode one year ago.  He does not have any history of wheezing or asthma.  No history of abnormal bleeding or clotting.  All other systems are negative.  PHYSICAL EXAMINATION:  GENERAL:  The patient is a 55 year old white male in no acute distress.  He is well-developed, well-nourished.  VITAL  SIGNS:  Blood pressure 118/74, heart rate 67, respirations 12.  NEUROLOGICAL:  Patient is alert and oriented x 3 and grossly intact.  HEENT:  Unremarkable.  LUNGS:  Clear to auscultation and percussion with equal breath sounds bilaterally.  No wheezing.  CARDIAC:  Regular rate and rhythm with normal S1, S2. No murmurs, rubs, or gallops.  NECK:  He has no thyromegaly or adenopathy.  He does have bilateral carotid bruits.  EXTREMITIES:  Without  clubbing, cyanosis, or edema.  He has 2+ radial pulses and normal island test bilaterally.  He has 2+ dorsalis pedis and posterior tibial pulses bilaterally.  LABORATORY DATA:  Cardiac catheterization shows three vessel disease with impaired left ventricular function.  Echocardiogram and Cardiolite is as noted in the history of present illness.  Remainder of laboratory studies pending and/or not available for review at this time.  IMPRESSION:  The patient is a 55 year old gentleman with multiple cardiac risk factors who has had minimal symptoms but has an echocardiogram showing impaired left ventricular function and positive Cardiolite.  At catheterization he has three vessel disease and left dominant circulation and impaired left ventricular function.  Coronary artery bypass grafting is indicated for survival benefit and relief of symptoms.  We plan to use bilateral mammary arteries given his young age, given the appearance of his target vessels I do not think radial artery harvest will add significantly to his outcome and would reserve that for potential future use should he need re-do procedure down the road.  I discussed in detail with the patient and his wife the indications, risks, benefits and alternative treatments.  They understand the operative approach to be used and expected hospital stay.  They also understand the likely out of hospital recuperation time as well.  The patient and his wife understand the risks of surgery include, but are not limited to, death, stroke, myocardial infarction, bleeding, possible need for blood transfusions, infections, deep venous thrombosis, pulmonary embolus, as well as other organ system dysfunctions such as respiratory, renal failure or gastrointestinal complications such as ulcer.  They understand coronary artery bypass grafting is palliative and that no guarantee can be given against  needing future cardiac interventions.  He also  understands the importance of lifestyle modification most significantly smoking cessation for his long term health.  All of the patients questions were answered.  He accepts the risks of surgery and agrees to proceed.  We will plan him for first case tomorrow morning. Dictated by:   Salvatore Decent Dorris Fetch, M.D. Attending Physician:  Charlett Lango DD:  01/25/02 TD:  01/28/02 Job: [phone] [phone] [id number]

## 2010-11-20 NOTE — Discharge Summary (Signed)
[name], [name] NO.:  [id number]   MEDICAL RECORD NO.:  [id number]                   PATIENT TYPE:  INP   LOCATION:  2021                                 FACILITY:  [facility]   PHYSICIAN:  Lucas Decent. Dorris Martinez, M.D.         DATE OF BIRTH:  [birth year]   DATE OF ADMISSION:  01/25/2002  DATE OF DISCHARGE:  01/30/2002                                 DISCHARGE SUMMARY   PRIMARY ADMITTING DIAGNOSIS:  Severe three-vessel coronary artery disease.   ADDITIONAL DIAGNOSES:  1. Hyperlipidemia.  2. Hypertension.  3. History of tobacco abuse.   PROCEDURE:  Coronary artery bypass grafting x4 (left internal mammary artery  as a free graft to the diagonal, right internal mammary artery to the left  anterior descending coronary artery, saphenous vein graft to the second  obtuse marginal, saphenous vein graft to the posterior descending artery).   HISTORY OF PRESENT ILLNESS:  The patient is a 55 year old male patient of  [name], M.D.  He has recently been experiencing some dyspnea  on exertion.  He was evaluated by Dr. Posey Martinez and underwent a stress  Cardiolite on 01/24/02.  The Cardiolite revealed significant abnormalities.  Because of this, he was referred for cardiac catheterization.  This was  performed on 7/24, and he was found to have significant three-vessel  coronary artery disease.  His lesions were not felt to be amenable to  percutaneous intervention, and therefore a cardiothoracic surgery  consultation was obtained.   HOSPITAL COURSE:  He was admitted and seen by [name].  It was felt  that he would be a good candidate for surgical revascularization.  He was  taken to the operating room on 7/25 and underwent CABG x4 with the above-  noted grafts.  Intraoperatively it should be noted that his left internal  mammary artery was densely adherent to the chest wall and poor during  harvest; therefore, it was used as a free graft to  the diagonal, and the  right internal mammary artery was used to graft to the LAD.  It was felt  that he would only be a candidate for a redo if there was some problem with  the LAD.  He was taken from the  operating room to the SICU in stable  condition.  He was extubated shortly after surgery.  He was hemodynamically  stable on postop day 1, and at that time he was ready for transfer to the  floor.  Postoperatively he has done well.  His chest tubes remained in place  on postop day 3, at which time he had no air leak noted and no drainage.  His tubes were removed at that time.  He has otherwise done well.  He is  ambulating in the halls without difficulty with cardiac rehab phase 1.  He  has been seen and counseled on smoking cessation.  He has been diuresed for  slight  volume overload.  He has remained afebrile, and all vital signs have  been stable.  He has been weaned off supplemental oxygen and has maintained  O2 saturations of around 95-97% on room air.  It is felt that if he  continues to remain stable, he will be ready for discharge home on 01/30/02.   DISCHARGE MEDICATIONS:  1. Enteric-coated aspirin 325 mg q.d.  2. Toprol XL 25 mg one-half tablet q.d.  3. Lasix 40 mg q.d. x1 week.  4. K-Dur 20 mEq q.d. x1 week.  5. Zocor 20 mg q.d.  6. Wellbutrin SR 150 mg q.d.  7. Folic acid 1 mg q.d.  8. Tylox one to two q.4h. p.r.n. for pain.   DISCHARGE INSTRUCTIONS:  He is to refrain from driving, heavy lifting, or  strenuous activity.  He may continue daily walking and use of incentive  spirometer.  He is asked to shower daily and clean his incisions with soap  and water.  He may continue a low-fat, low-sodium diet.   DISCHARGE FOLLOW-UP:  He will see [name], M.D., in the office in  two weeks and have a chest x-ray at that time.  He will then follow up at  the [facility] office with [name] on 02/28/02.  He is asked to bring his  chest x-ray to that appointment.        [name], P.A.                     [name], M.D.    GLC/MEDQ  D:  01/29/2002  T:  02/05/2002  Job:  [id number]   cc:   [name], M.D. [facility]   [name], M.D. [facility]

## 2010-11-20 NOTE — Assessment & Plan Note (Signed)
[facility]                            CARDIOLOGY OFFICE NOTE   RAYMUNDO, ROUT                       MRN:          [id number]  DATE:10/28/2006                            DOB:          03-25-1956    Mr. Cutting is seen for cardiology followup.  I am pleased that he is  doing well.  He has significant coronary disease.  We have started him  on a small dose of Niaspan that he is tolerating.  He does get flushing,  but if he takes aspirin and medication and goes to bed, he deals with it  without difficulty.  He is very intent on taking everything that we feel  is most aggressive for him.  He is not having any significant chest  pain.  He is not having any significant shortness of breath.   PAST MEDICAL HISTORY:   ALLERGIES:  No known drug allergies.   MEDICATIONS:  1. Vytorin 10/80.  2. Protonix.  3. Diovan 160.  4. Aspirin 81.  5. Coreg 25 b.i.d.  6. Niaspan 500 (to be increased to 1000 mg daily).   OTHER MEDICAL PROBLEMS:  See the complete list on my note of Nov 19, 2005.   REVIEW OF SYSTEMS:  He feels great and his review of systems is  negative.   PHYSICAL EXAM:  Blood pressure is 124/76 with a pulse of 61, his weight  is 240.  This is stable, but he knows he needs to lose weight.  Patient  is oriented to person, time and place.  Affect is normal.  HEENT:  Reveals no xanthelasma.  He has normal extraocular motion.  He  has no carotid bruits.  There is no jugular venous distention.  LUNGS:  Clear.  Respiratory effort is not labored.  CARDIAC EXAM:  Reveals an S1 with an S2.  There are no clicks or  significant murmurs.  ABDOMEN:  His abdomen is soft.  He has normal bowel sounds.  He has no peripheral edema.   His EKG today reveals no change.   PROBLEMS INCLUDE:  1. Coronary disease,  status post CABG in July of 2003.  2. Left ventricular dysfunction with an ejection fraction in the 40%      range.  3. Some arm tingling that is  not cardiac.  4. Heart failure action trial that is now complete.  5. Peripheral vascular disease, followed by Dr. Madilyn Fireman.  6. Hyperlipidemia, treated.  He and I have had a careful discussion      about recent information on Vytorin.  In his case, he needs this      medicine to get the goals and I plan to continue it for now.  He      understands.  7. History of cigarette use in the past that he quit.  8. Slight volume overload in the past.  He has some Lasix to use on a      limited p.r.n. basis and he is not having any difficulty.   I believe he is quite stable.  I have very carefully thought through  whether he needs an ICD and I feel he does not need this at this time.  We will keep this in mind.  We will try to push all the meds to optimum  levels.  Most recently, his HDL had gone from 28 up to 33.  His LDL was  52 and triglycerides 59 and this is on Niaspan 500 and Vytorin 10/80.   I will see him back in six months.     [name], MD, [facility]  Electronically Signed    JDK/MedQ  DD: 10/28/2006  DT: 10/28/2006  Job #: [id number]   cc:   Georgina Quint. Plotnikov, MD

## 2011-01-18 ENCOUNTER — Other Ambulatory Visit: Payer: Self-pay | Admitting: Internal Medicine

## 2011-01-28 ENCOUNTER — Encounter: Payer: Self-pay | Admitting: Cardiology

## 2011-01-31 ENCOUNTER — Encounter: Payer: Self-pay | Admitting: Cardiology

## 2011-02-02 ENCOUNTER — Ambulatory Visit (INDEPENDENT_AMBULATORY_CARE_PROVIDER_SITE_OTHER): Payer: 59 | Admitting: Cardiology

## 2011-02-02 ENCOUNTER — Encounter: Payer: Self-pay | Admitting: Cardiology

## 2011-02-02 DIAGNOSIS — I739 Peripheral vascular disease, unspecified: Secondary | ICD-10-CM | POA: Insufficient documentation

## 2011-02-02 DIAGNOSIS — R9430 Abnormal result of cardiovascular function study, unspecified: Secondary | ICD-10-CM | POA: Insufficient documentation

## 2011-02-02 DIAGNOSIS — I779 Disorder of arteries and arterioles, unspecified: Secondary | ICD-10-CM

## 2011-02-02 DIAGNOSIS — E785 Hyperlipidemia, unspecified: Secondary | ICD-10-CM

## 2011-02-02 DIAGNOSIS — G459 Transient cerebral ischemic attack, unspecified: Secondary | ICD-10-CM | POA: Insufficient documentation

## 2011-02-02 DIAGNOSIS — E8770 Fluid overload, unspecified: Secondary | ICD-10-CM | POA: Insufficient documentation

## 2011-02-02 DIAGNOSIS — G479 Sleep disorder, unspecified: Secondary | ICD-10-CM | POA: Insufficient documentation

## 2011-02-02 DIAGNOSIS — I2510 Atherosclerotic heart disease of native coronary artery without angina pectoris: Secondary | ICD-10-CM

## 2011-02-02 DIAGNOSIS — E8779 Other fluid overload: Secondary | ICD-10-CM

## 2011-02-02 DIAGNOSIS — I340 Nonrheumatic mitral (valve) insufficiency: Secondary | ICD-10-CM | POA: Insufficient documentation

## 2011-02-02 DIAGNOSIS — I10 Essential (primary) hypertension: Secondary | ICD-10-CM | POA: Insufficient documentation

## 2011-02-02 DIAGNOSIS — I358 Other nonrheumatic aortic valve disorders: Secondary | ICD-10-CM | POA: Insufficient documentation

## 2011-02-02 LAB — LIPID PANEL
Cholesterol: 99 mg/dL (ref 0–200)
HDL: 37.3 mg/dL — ABNORMAL LOW (ref >39.00)
LDL Cholesterol: 46 mg/dL (ref 0–99)
Triglycerides: 78 mg/dL (ref 0.0–149.0)
VLDL: 15.6 mg/dL (ref 0.0–40.0)

## 2011-02-02 LAB — CBC WITH DIFFERENTIAL/PLATELET
Basophils Absolute: 0 10*3/uL (ref 0.0–0.1)
Basophils Relative: 0.3 % (ref 0.0–3.0)
Eosinophils Absolute: 0.1 10*3/uL (ref 0.0–0.7)
Lymphocytes Relative: 36.4 % (ref 12.0–46.0)
MCHC: 33.5 g/dL (ref 30.0–36.0)
MCV: 97.1 fl (ref 78.0–100.0)
Monocytes Absolute: 0.7 10*3/uL (ref 0.1–1.0)
Neutrophils Relative %: 52.3 % (ref 43.0–77.0)
Platelets: 217 10*3/uL (ref 150.0–400.0)
RDW: 13.1 % (ref 11.5–14.6)

## 2011-02-02 LAB — HEPATIC FUNCTION PANEL
Alkaline Phosphatase: 56 U/L (ref 39–117)
Bilirubin, Direct: 0.1 mg/dL (ref 0.0–0.3)
Total Bilirubin: 0.9 mg/dL (ref 0.3–1.2)
Total Protein: 7.8 g/dL (ref 6.0–8.3)

## 2011-02-02 LAB — BASIC METABOLIC PANEL
BUN: 14 mg/dL (ref 6–23)
CO2: 25 mEq/L (ref 19–32)
Chloride: 105 mEq/L (ref 96–112)
Creatinine, Ser: 0.9 mg/dL (ref 0.4–1.5)
Glucose, Bld: 127 mg/dL — ABNORMAL HIGH (ref 70–99)
Potassium: 3.9 mEq/L (ref 3.5–5.1)

## 2011-02-02 NOTE — Assessment & Plan Note (Signed)
Patient is taking his medications.  He is fasting today.  Lipid profile will be checked.

## 2011-02-02 NOTE — Assessment & Plan Note (Signed)
Patient has significant carotid artery disease.  His last Doppler was done one year ago.  This needs to be repeated at this time.

## 2011-02-02 NOTE — Assessment & Plan Note (Signed)
His volume status is stable. No change in therapy. 

## 2011-02-02 NOTE — Patient Instructions (Signed)
Your physician recommends that you schedule a follow-up appointment in: 1 year  Your physician recommends that you return for lab work in: today  Your physician has requested that you have en exercise stress myoview. For further information please visit [email or web address] Please follow instruction sheet, as given.  Your physician has requested that you have a carotid duplex. This test is an ultrasound of the carotid arteries in your neck. It looks at blood flow through these arteries that supply the brain with blood. Allow one hour for this exam. There are no restrictions or special instructions.

## 2011-02-02 NOTE — Assessment & Plan Note (Signed)
Coronary disease is stable.  Patient underwent surgery in 2003.  His last nuclear study was 2005.  He is very active but we need to proceed with a stress nuclear scan.  This will be arranged in all be in touch with him with the information.

## 2011-02-02 NOTE — Progress Notes (Signed)
HPI Patient is seen for followup of coronary artery disease.  He underwent CABG in 2003.  His last nuclear study was 2005.  He had an echo in May, 2011.  He has inferior akinesis.  His ejection fraction is 40%.  He is very active.  He's been walking going up and down stairs in significant heat and having no problems.  He has no chest pain or shortness of breath.  There is no syncope.  Patient does rarely have some mild edema.  He will take a dose of Lasix if this occurs. No Known Allergies  Current Outpatient Prescriptions  Medication Sig Dispense Refill  . aspirin 81 MG EC tablet Take 81 mg by mouth daily.        . carvedilol (COREG) 25 MG tablet Take 25 mg by mouth 2 (two) times daily with a meal.        . Cholecalciferol (VITAMIN D3) 1000 UNITS tablet Take 1,000 Units by mouth daily.        Marland Kitchen DIOVAN 160 MG tablet TAKE 1 TABLET DAILY  90 tablet  1  . ezetimibe-simvastatin (VYTORIN) 10-80 MG per tablet Take 1 tablet by mouth daily.        . furosemide (LASIX) 40 MG tablet Take 40 mg by mouth as needed.        Marland Kitchen ibuprofen (ADVIL,MOTRIN) 600 MG tablet Take 600 mg by mouth as needed.        . niacin (NIASPAN) 500 MG CR tablet Take 1,500 mg by mouth at bedtime.       . pantoprazole (PROTONIX) 40 MG tablet Take 40 mg by mouth daily. Brand Medically Necessary       . potassium chloride (KLOR-CON) 20 MEQ packet Take 20 mEq by mouth as needed.        . traMADol (ULTRAM) 50 MG tablet Take 50 mg by mouth 2 (two) times daily as needed.          History   Social History  . Marital Status: Married    Spouse Name: N/A    Number of Children: N/A  . Years of Education: N/A   Occupational History  . Not on file.   Social History Main Topics  . Smoking status: Never Smoker   . Smokeless tobacco: Not on file  . Alcohol Use: No  . Drug Use: No  . Sexually Active: Not on file   Other Topics Concern  . Not on file   Social History Narrative   No Regular exercise    Family History  Problem  Relation Age of Onset  . Coronary artery disease      Male 1st degree relative <50  . Diabetes      1st degree relative  . Stroke      Male 1st degree relative<50    Past Medical History  Diagnosis Date  . Hypertension   . Peripheral vascular disease     Dr. Georganna Skeans  . TIA (transient ischemic attack)     by history  . Aortic valve sclerosis     aortic valve sclerosis  . CAD (coronary artery disease)     nuclear 2005  /    . 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 Ejection fraction < 50%     EF 40%, echo 2006, no ICD needed  / EF 40%, echo, 11/2009  . Hyperlipidemia     Low HDL  . Former cigarette smoker   . Volume overload   .  Carotid artery disease     Doppler. July, 2010. chronic occlusion left common carotid artery. 60-79% RICA, 0-39% LICA, LCCA occluded. unchanged. followup one year  . Sleep disorder     Dr Shelle Iron, lose weight and consider sleep study later  . Mitral regurgitation     mild, echo, 11/2009    Past Surgical History  Procedure Date  . Coronary artery bypass graft   . Colonoscopy 03/19/2009    ROS  Patient denies fever, chills, headache, sweats, rash, change in vision, change in hearing, chest pain, cough, nausea vomiting, urinary symptoms.  All other systems are reviewed and are negative  PHYSICAL EXAM Patient is stable.  He is oriented to person time and place.  Affect is normal.  Head is atraumatic.  There is no jugular venous distention.  There are carotid bruits.  Lungs are clear.  Respiratory effort is nonlabored.  Cardiac exam reveals S1 and S2.  There is no clicks or significant murmurs.  Abdomen is soft.  There is no peripheral edema.  There are no musculoskeletal deformities.  There are no skin rashes. Filed Vitals:   02/02/11 1059  BP: 96/60  Pulse: 66  Height: 6\' 1"  (1.854 m)  Weight: 247 lb (112.038 kg)    EKG He has done today and reviewed by me.  It is compared to his old tracing.  He has inferior Q waves compatible with his old inferior  MI.  One PVC is noted.  There is normal sinus rhythm.  ASSESSMENT & PLAN

## 2011-02-08 ENCOUNTER — Encounter: Payer: Self-pay | Admitting: *Deleted

## 2011-02-18 ENCOUNTER — Ambulatory Visit (HOSPITAL_COMMUNITY): Payer: 59 | Attending: Cardiology | Admitting: Radiology

## 2011-02-18 ENCOUNTER — Encounter (INDEPENDENT_AMBULATORY_CARE_PROVIDER_SITE_OTHER): Payer: 59 | Admitting: Cardiology

## 2011-02-18 VITALS — Ht 73.0 in | Wt 247.0 lb

## 2011-02-18 DIAGNOSIS — I2510 Atherosclerotic heart disease of native coronary artery without angina pectoris: Secondary | ICD-10-CM

## 2011-02-18 DIAGNOSIS — I6529 Occlusion and stenosis of unspecified carotid artery: Secondary | ICD-10-CM

## 2011-02-18 DIAGNOSIS — I25810 Atherosclerosis of coronary artery bypass graft(s) without angina pectoris: Secondary | ICD-10-CM

## 2011-02-18 MED ORDER — TECHNETIUM TC 99M TETROFOSMIN IV KIT
33.0000 | PACK | Freq: Once | INTRAVENOUS | Status: AC | PRN
  Administered 2011-02-18: 33 via INTRAVENOUS

## 2011-02-18 MED ORDER — TECHNETIUM TC 99M TETROFOSMIN IV KIT
11.0000 | PACK | Freq: Once | INTRAVENOUS | Status: AC | PRN
  Administered 2011-02-18: 11 via INTRAVENOUS

## 2011-02-18 NOTE — Progress Notes (Signed)
[facility] SITE 3 NUCLEAR MED [address] [place] [address] [phone]  Cardiology Nuclear Med Study  [name] is a 55 y.o. male [id number] 10-May-1956   Nuclear Med Background Indication for Stress Test:  Evaluation for Ischemia and Graft Patency History:  '03 CABG; '05 OZH:YQMV, but no ischemia, EF=35%; 5/11 Echo:EF=40%, Mild MR Cardiac Risk Factors: Carotid Disease, Family History - CAD, History of Smoking, Hypertension, Lipids, Obesity, PVD and TIA  Symptoms:  Dizziness, DOE and Palpitations   Nuclear Pre-Procedure Caffeine/Decaff Intake:  None NPO After: 8:30pm   Lungs:  Clear. IV 0.9% NS with Angio Cath:  20g  IV Site: R Hand  IV Started by:  Bonnita Levan, RN  Chest Size (in):  48 Cup Size: n/a  Height: 6\' 1"  (1.854 m)  Weight:  247 lb (112.038 kg)  BMI:  Body mass index is 32.59 kg/(m^2). Tech Comments:  Patient held Coreg x 24 hours    Nuclear Med Study 1 or 2 day study: 1 day  Stress Test Type:  Stress  Reading MD: Kristeen Miss, MD  Order Authorizing Provider:  Willa Rough, MD  Resting Radionuclide: Technetium 15m Tetrofosmin  Resting Radionuclide Dose: 11.0 mCi   Stress Radionuclide:  Technetium 10m Tetrofosmin  Stress Radionuclide Dose: 33.0 mCi           Stress Protocol Rest HR: 62 Stress HR: 160  Rest BP: 97/84,                               Repeated after Standing:138/80;     Standing BP:150/61 Stress BP: 208/95  Exercise Time (min): 9:01 METS: 10.4          Dose of Adenosine (mg):  n/a Dose of Lexiscan: n/a mg  Dose of Atropine (mg): n/a Dose of Dobutamine: n/a mcg/kg/min (at max HR)  Stress Test Technologist: Smiley Houseman, CMA-N  Nuclear Technologist:  Domenic Polite, CNMT     Rest Procedure:  Myocardial perfusion imaging was performed at rest 45 minutes following the intravenous administration of Technetium 64m Tetrofosmin.  Rest ECG: No acute changes.  Ventricular couplets with RBBB morphology.  Stress  Procedure:  The patient exercised for 9:01 on the treadmill utilizing the Bruce protocol.  The patient stopped due to fatigue and denied any chest pain.  There were no diagnostic ST-T wave changes. There was frequent ectopy and a mild hypertensive response to exercise, 208/95.  Technetium 42m Tetrofosmin was injected at peak exercise and myocardial perfusion imaging was performed after a brief delay.  Stress ECG: No significant change from baseline ECG  QPS Raw Data Images:  Normal; no motion artifact; normal heart/lung ratio. Stress Images:  The LV is markedly dilated.  There is a large defect in the mid and basal inferior/lateral walls.  The uptake in the septum and anterior lateral well is fairly normal. Rest Images:  The LV is markedly dilated.  There is a large defect in the mid and basal inferior/lateral walls.  The uptake in the septum and anterior lateral well is fairly normal Subtraction (SDS):  No evidence of ischemia.  There is evidence of a large inferior lateral MI Transient Ischemic Dilatation (Normal <1.22):  0.94 Lung/Heart Ratio (Normal <0.45):  0.35  Quantitative Gated Spect Images QGS EDV:  209 ml QGS ESV:  137 ml QGS cine images:  The LV is markedly dilated.  There is akineis of the inferior lateral wall.  QGS EF: 35%  Impression Exercise Capacity:  Good exercise capacity. BP Response:  Hypertensive blood pressure response. Clinical Symptoms:  No chest pain. ECG Impression:  No significant ST segment change suggestive of ischemia. Comparison with Prior Nuclear Study: No significant change from previous study  Overall Impression:  Abnormal stress nuclear study.  There is a previous large inferior lateral MI.  There is no evidence of ischemia.  The LV function is moderately-severely depressed with akinesis of the inferior lateral wall.    Vesta Mixer, Montez Hageman., MD, [facility]

## 2011-03-09 NOTE — Progress Notes (Signed)
Pt was notified of the results 

## 2011-03-17 ENCOUNTER — Telehealth: Payer: Self-pay | Admitting: *Deleted

## 2011-03-17 DIAGNOSIS — R509 Fever, unspecified: Secondary | ICD-10-CM

## 2011-03-17 DIAGNOSIS — Z20828 Contact with and (suspected) exposure to other viral communicable diseases: Secondary | ICD-10-CM

## 2011-03-17 NOTE — Telephone Encounter (Signed)
OK CBC, mono spot Thx

## 2011-03-17 NOTE — Telephone Encounter (Signed)
Patient informed, labs ordered.

## 2011-03-17 NOTE — Telephone Encounter (Signed)
Pt's daughter was dx with mono. He has "similar symptoms" and would like labs to be checked for mono. Please advise.

## 2011-03-18 ENCOUNTER — Other Ambulatory Visit (INDEPENDENT_AMBULATORY_CARE_PROVIDER_SITE_OTHER): Payer: 59

## 2011-03-18 ENCOUNTER — Telehealth: Payer: Self-pay | Admitting: Internal Medicine

## 2011-03-18 DIAGNOSIS — Z20828 Contact with and (suspected) exposure to other viral communicable diseases: Secondary | ICD-10-CM

## 2011-03-18 DIAGNOSIS — R509 Fever, unspecified: Secondary | ICD-10-CM

## 2011-03-18 LAB — CBC WITH DIFFERENTIAL/PLATELET
Basophils Absolute: 0 10*3/uL (ref 0.0–0.1)
Eosinophils Absolute: 0.2 10*3/uL (ref 0.0–0.7)
Eosinophils Relative: 2.3 % (ref 0.0–5.0)
Lymphs Abs: 2.7 10*3/uL (ref 0.7–4.0)
MCHC: 34 g/dL (ref 30.0–36.0)
MCV: 95.9 fl (ref 78.0–100.0)
Monocytes Absolute: 0.7 10*3/uL (ref 0.1–1.0)
Neutrophils Relative %: 47.4 % (ref 43.0–77.0)
Platelets: 192 10*3/uL (ref 150.0–400.0)
RDW: 12.6 % (ref 11.5–14.6)
WBC: 7 10*3/uL (ref 4.5–10.5)

## 2011-03-18 LAB — MONONUCLEOSIS SCREEN: Mono Screen: NEGATIVE

## 2011-03-18 NOTE — Telephone Encounter (Signed)
[name] , please, inform the patient: labs are OK, no mono   Please, keep  next office visit appointment.   Thank you !

## 2011-03-19 NOTE — Telephone Encounter (Signed)
Pt informed and transferred to scheduler.  

## 2011-04-06 ENCOUNTER — Other Ambulatory Visit: Payer: Self-pay | Admitting: Internal Medicine

## 2011-04-07 ENCOUNTER — Ambulatory Visit: Payer: 59 | Admitting: Internal Medicine

## 2011-04-09 ENCOUNTER — Other Ambulatory Visit: Payer: Self-pay

## 2011-04-09 MED ORDER — CARVEDILOL 25 MG PO TABS: 25.0000 mg | ORAL_TABLET | Freq: Two times a day (BID) | ORAL | Status: DC

## 2011-04-10 ENCOUNTER — Other Ambulatory Visit: Payer: Self-pay | Admitting: Cardiology

## 2011-04-12 ENCOUNTER — Other Ambulatory Visit: Payer: Self-pay

## 2011-04-15 ENCOUNTER — Other Ambulatory Visit: Payer: Self-pay | Admitting: *Deleted

## 2011-04-15 MED ORDER — CARVEDILOL 25 MG PO TABS: 25.0000 mg | ORAL_TABLET | Freq: Two times a day (BID) | ORAL | Status: DC

## 2011-04-19 ENCOUNTER — Telehealth: Payer: Self-pay | Admitting: Cardiology

## 2011-04-19 NOTE — Telephone Encounter (Signed)
[facility] is calling about coreg please call

## 2011-04-28 ENCOUNTER — Other Ambulatory Visit: Payer: Self-pay | Admitting: *Deleted

## 2011-04-28 MED ORDER — CARVEDILOL 25 MG PO TABS: 25.0000 mg | ORAL_TABLET | Freq: Two times a day (BID) | ORAL | Status: DC

## 2011-05-05 ENCOUNTER — Other Ambulatory Visit: Payer: Self-pay | Admitting: Internal Medicine

## 2011-06-04 ENCOUNTER — Other Ambulatory Visit: Payer: Self-pay | Admitting: Cardiology

## 2011-07-19 ENCOUNTER — Other Ambulatory Visit: Payer: Self-pay | Admitting: Internal Medicine

## 2011-09-28 ENCOUNTER — Other Ambulatory Visit: Payer: Self-pay | Admitting: *Deleted

## 2011-09-28 DIAGNOSIS — I6529 Occlusion and stenosis of unspecified carotid artery: Secondary | ICD-10-CM

## 2011-10-05 ENCOUNTER — Encounter (INDEPENDENT_AMBULATORY_CARE_PROVIDER_SITE_OTHER): Payer: 59

## 2011-10-05 DIAGNOSIS — I6529 Occlusion and stenosis of unspecified carotid artery: Secondary | ICD-10-CM

## 2011-10-12 ENCOUNTER — Encounter: Payer: Self-pay | Admitting: Cardiology

## 2011-10-14 ENCOUNTER — Telehealth: Payer: Self-pay | Admitting: *Deleted

## 2011-10-14 DIAGNOSIS — Z0389 Encounter for observation for other suspected diseases and conditions ruled out: Secondary | ICD-10-CM

## 2011-10-14 DIAGNOSIS — Z0000 Encounter for general adult medical examination without abnormal findings: Secondary | ICD-10-CM

## 2011-10-14 NOTE — Telephone Encounter (Signed)
June CPE labs entered.

## 2011-10-14 NOTE — Telephone Encounter (Signed)
Message copied by Merrilyn Puma on Thu Oct 14, 2011  8:17 AM ------      Message from: COUSIN, SHARON T      Created: Mon Sep 20, 2011 10:06 AM      Regarding: PHY DATE  12/13/11       THANKS

## 2011-11-08 ENCOUNTER — Other Ambulatory Visit: Payer: Self-pay | Admitting: Internal Medicine

## 2011-12-01 ENCOUNTER — Other Ambulatory Visit: Payer: Self-pay | Admitting: Internal Medicine

## 2011-12-08 ENCOUNTER — Other Ambulatory Visit (INDEPENDENT_AMBULATORY_CARE_PROVIDER_SITE_OTHER): Payer: 59

## 2011-12-08 DIAGNOSIS — Z0389 Encounter for observation for other suspected diseases and conditions ruled out: Secondary | ICD-10-CM

## 2011-12-08 DIAGNOSIS — Z0000 Encounter for general adult medical examination without abnormal findings: Secondary | ICD-10-CM

## 2011-12-08 LAB — URINALYSIS, ROUTINE W REFLEX MICROSCOPIC
Bilirubin Urine: NEGATIVE
Leukocytes, UA: NEGATIVE
Nitrite: NEGATIVE
pH: 6 (ref 5.0–8.0)

## 2011-12-08 LAB — BASIC METABOLIC PANEL
BUN: 11 mg/dL (ref 6–23)
Calcium: 9.1 mg/dL (ref 8.4–10.5)
Creatinine, Ser: 0.9 mg/dL (ref 0.4–1.5)
GFR: 90.03 mL/min (ref >60.00)
Glucose, Bld: 126 mg/dL — ABNORMAL HIGH (ref 70–99)
Potassium: 4.6 mEq/L (ref 3.5–5.1)

## 2011-12-08 LAB — HEPATIC FUNCTION PANEL
AST: 23 U/L (ref 0–37)
Albumin: 4.1 g/dL (ref 3.5–5.2)
Total Bilirubin: 1 mg/dL (ref 0.3–1.2)

## 2011-12-08 LAB — CBC WITH DIFFERENTIAL/PLATELET
Basophils Absolute: 0 10*3/uL (ref 0.0–0.1)
Eosinophils Relative: 2.2 % (ref 0.0–5.0)
HCT: 42.2 % (ref 39.0–52.0)
Lymphocytes Relative: 38 % (ref 12.0–46.0)
Monocytes Relative: 9.1 % (ref 3.0–12.0)
Neutrophils Relative %: 50.2 % (ref 43.0–77.0)
Platelets: 192 10*3/uL (ref 150.0–400.0)
RDW: 13.6 % (ref 11.5–14.6)
WBC: 6 10*3/uL (ref 4.5–10.5)

## 2011-12-08 LAB — LIPID PANEL
Cholesterol: 149 mg/dL (ref 0–200)
HDL: 33.7 mg/dL — ABNORMAL LOW (ref >39.00)
LDL Cholesterol: 92 mg/dL (ref 0–99)
Triglycerides: 115 mg/dL (ref 0.0–149.0)
VLDL: 23 mg/dL (ref 0.0–40.0)

## 2011-12-08 LAB — TSH: TSH: 1.85 u[IU]/mL (ref 0.35–5.50)

## 2011-12-13 ENCOUNTER — Ambulatory Visit (INDEPENDENT_AMBULATORY_CARE_PROVIDER_SITE_OTHER): Payer: 59 | Admitting: Internal Medicine

## 2011-12-13 ENCOUNTER — Encounter: Payer: Self-pay | Admitting: Internal Medicine

## 2011-12-13 VITALS — BP 130/78 | HR 80 | Temp 98.4°F | Resp 16 | Ht 72.0 in | Wt 250.0 lb

## 2011-12-13 DIAGNOSIS — G479 Sleep disorder, unspecified: Secondary | ICD-10-CM

## 2011-12-13 DIAGNOSIS — M25569 Pain in unspecified knee: Secondary | ICD-10-CM

## 2011-12-13 DIAGNOSIS — Z0000 Encounter for general adult medical examination without abnormal findings: Secondary | ICD-10-CM

## 2011-12-13 DIAGNOSIS — R7309 Other abnormal glucose: Secondary | ICD-10-CM

## 2011-12-13 DIAGNOSIS — E785 Hyperlipidemia, unspecified: Secondary | ICD-10-CM

## 2011-12-13 MED ORDER — VALSARTAN 160 MG PO TABS: 80.0000 mg | ORAL_TABLET | Freq: Every day | ORAL | Status: DC

## 2011-12-13 MED ORDER — PANTOPRAZOLE SODIUM 40 MG PO TBEC: 40.0000 mg | DELAYED_RELEASE_TABLET | Freq: Every day | ORAL | Status: DC

## 2011-12-13 MED ORDER — IBUPROFEN 600 MG PO TABS: 600.0000 mg | ORAL_TABLET | ORAL | Status: DC | PRN

## 2011-12-13 NOTE — Assessment & Plan Note (Signed)
We discussed age appropriate health related issues, including available/recomended screening tests and vaccinations. We discussed a need for adhering to healthy diet and exercise. Labs/EKG were reviewed/ordered. All questions were answered.   

## 2011-12-13 NOTE — Progress Notes (Signed)
Subjective:    Patient ID: [name], male    DOB: September 30, 1955, 56 y.o.   MRN: [id number]  HPI  The patient is here for a wellness exam. The patient has been doing well overall without major physical or psychological issues going on lately. The patient needs to address  chronic hypertension that has been well controlled with medicines; to address chronic  hyperlipidemia controlled with medicines as well; and to address CAD, type 2 pre diabetes, controlled with medical treatment and diet.   BP Readings from Last 3 Encounters:  12/13/11 130/78  02/02/11 96/60  05/15/10 124/70   Wt Readings from Last 3 Encounters:  12/13/11 250 lb (113.399 kg)  02/18/11 247 lb (112.038 kg)  02/02/11 247 lb (112.038 kg)      Review of Systems  Constitutional: Negative for chills, appetite change, fatigue and unexpected weight change.  HENT: Negative for nosebleeds, congestion, sore throat, sneezing, trouble swallowing and neck pain.   Eyes: Negative for itching and visual disturbance.  Respiratory: Negative for cough.   Cardiovascular: Negative for chest pain, palpitations and leg swelling.  Gastrointestinal: Negative for nausea, diarrhea, blood in stool and abdominal distention.  Genitourinary: Negative for frequency and hematuria.  Musculoskeletal: Positive for arthralgias. Negative for back pain, joint swelling and gait problem.  Skin: Negative for rash.  Neurological: Negative for dizziness, tremors, speech difficulty and weakness.  Psychiatric/Behavioral: Negative for sleep disturbance, dysphoric mood and agitation. The patient is not nervous/anxious.        Objective:   Physical Exam  Constitutional: He is oriented to person, place, and time. He appears well-developed and well-nourished. No distress.  HENT:  Head: Normocephalic and atraumatic.  Right Ear: External ear normal.  Left Ear: External ear normal.  Nose: Nose normal.  Mouth/Throat: Oropharynx is clear and moist. No  oropharyngeal exudate.  Eyes: Conjunctivae and EOM are normal. Pupils are equal, round, and reactive to light. Right eye exhibits no discharge. Left eye exhibits no discharge. No scleral icterus.  Neck: Normal range of motion. Neck supple. No JVD present. No tracheal deviation present. No thyromegaly present.  Cardiovascular: Normal rate, regular rhythm, normal heart sounds and intact distal pulses.  Exam reveals no gallop and no friction rub.   No murmur heard. Pulmonary/Chest: Effort normal and breath sounds normal. No stridor. No respiratory distress. He has no wheezes. He has no rales. He exhibits no tenderness.  Abdominal: Soft. Bowel sounds are normal. He exhibits no distension and no mass. There is no tenderness. There is no rebound and no guarding.  Genitourinary: Rectum normal and prostate normal. Guaiac negative stool. No penile tenderness.       1+ prostate  Musculoskeletal: Normal range of motion. He exhibits no edema and no tenderness.  Lymphadenopathy:    He has no cervical adenopathy.  Neurological: He is alert and oriented to person, place, and time. He has normal reflexes. No cranial nerve deficit. He exhibits normal muscle tone. Coordination normal.  Skin: Skin is warm and dry. No rash noted. He is not diaphoretic. No erythema. No pallor.  Psychiatric: He has a normal mood and affect. His behavior is normal. Judgment and thought content normal.  AKs on arms Lab Results  Component Value Date   WBC 6.0 12/08/2011   HGB 14.4 12/08/2011   HCT 42.2 12/08/2011   PLT 192.0 12/08/2011   GLUCOSE 126* 12/08/2011   CHOL 149 12/08/2011   TRIG 115.0 12/08/2011   HDL 33.70* 12/08/2011   LDLCALC 92 12/08/2011  ALT 28 12/08/2011   AST 23 12/08/2011   NA 139 12/08/2011   K 4.6 12/08/2011   CL 104 12/08/2011   CREATININE 0.9 12/08/2011   BUN 11 12/08/2011   CO2 29 12/08/2011   TSH 1.85 12/08/2011   PSA 0.46 12/08/2011   HGBA1C 6.9* 07/09/2009          Assessment & Plan:

## 2011-12-13 NOTE — Assessment & Plan Note (Signed)
Loose wt Labs in 6 mo

## 2011-12-13 NOTE — Assessment & Plan Note (Signed)
Continue with current prescription therapy as reflected on the Med list.  

## 2011-12-13 NOTE — Assessment & Plan Note (Signed)
R knee OA Continue with current prescription therapy as reflected on the Med list.

## 2012-01-03 ENCOUNTER — Encounter: Payer: Self-pay | Admitting: Internal Medicine

## 2012-01-03 ENCOUNTER — Other Ambulatory Visit: Payer: Self-pay | Admitting: Internal Medicine

## 2012-01-03 ENCOUNTER — Ambulatory Visit (INDEPENDENT_AMBULATORY_CARE_PROVIDER_SITE_OTHER): Payer: 59 | Admitting: Internal Medicine

## 2012-01-03 VITALS — BP 140/82 | HR 84 | Temp 98.4°F | Resp 16 | Wt 254.0 lb

## 2012-01-03 DIAGNOSIS — D485 Neoplasm of uncertain behavior of skin: Secondary | ICD-10-CM

## 2012-01-03 NOTE — Assessment & Plan Note (Signed)
See procedure 

## 2012-01-03 NOTE — Progress Notes (Signed)
  Subjective:    Patient ID: [name], male    DOB: February 22, 1956, 56 y.o.   MRN: [id number]  HPI  Skin bx x2  Review of Systems     Objective:   Physical Exam   Procedure Note :     Procedure :  Skin biopsy   Indication:  Changing mole (s ),  Suspicious lesion(s)   Risks including unsuccessful procedure , bleeding, infection, bruising, scar, a need for another complete procedure and others were explained to the patient in detail as well as the benefits. Informed consent was obtained and signed.   The patient was placed in a decubitus position.  Lesion #1 on  L dist lat arm   measuring    6x7 mm   Skin over lesion #1  was prepped with Betadine and alcohol  and anesthetized with 1 cc of 2% lidocaine and epinephrine, using a 25-gauge 1 inch needle.  Shave biopsy with a sterile Dermablade was carried out in the usual fashion. Hyfrecator was used to destroy the rest of the lesion potentially left behind and for hemostasis. Band-Aid was applied with antibiotic ointment.    Lesion #2 on  R prox medial forearm - irritated SK   measuring  6x7 mm   Skin over lesion #2  was prepped with Betadine and alcohol  and anesthetized with 1 cc of 2% lidocaine and epinephrine, using a 25-gauge 1 inch needle.  Shave biopsy with a sterile Dermablade was carried out in the usual fashion. Hyfrecator was used to destroy the rest of the lesion potentially left behind and for hemostasis. Band-Aid was applied with antibiotic ointment.  #2 was discarded. #1 was sent to the path lab   Tolerated well. Complications none.      Postprocedure instructions :    A Band-Aid should be  changed twice daily. You can take a shower tomorrow.  Keep the wounds clean. You can wash them with liquid soap and water. Pat dry with gauze or a Kleenex tissue  Before applying antibiotic ointment and a Band-Aid.   You need to report immediately  if fever, chills or any signs of infection develop.    The biopsy results  should be available in 1 -2 weeks.        Assessment & Plan:

## 2012-01-05 ENCOUNTER — Telehealth: Payer: Self-pay | Admitting: Internal Medicine

## 2012-01-05 NOTE — Telephone Encounter (Signed)
Pts wife informed.

## 2012-01-05 NOTE — Telephone Encounter (Signed)
Stacey, please, inform patient that his skin bx was benign Thx  

## 2012-03-21 ENCOUNTER — Telehealth: Payer: Self-pay | Admitting: Internal Medicine

## 2012-03-21 NOTE — Telephone Encounter (Signed)
Patient had his medication switched from [facility] to [facility] [place] and they need clarification on the directions for the Patients Ibuprofen, order ID in [id number]

## 2012-03-22 ENCOUNTER — Telehealth: Payer: Self-pay | Admitting: *Deleted

## 2012-03-22 ENCOUNTER — Other Ambulatory Visit: Payer: Self-pay | Admitting: *Deleted

## 2012-03-22 MED ORDER — PANTOPRAZOLE SODIUM 40 MG PO TBEC: 40.0000 mg | DELAYED_RELEASE_TABLET | Freq: Every day | ORAL | Status: DC

## 2012-03-22 MED ORDER — VALSARTAN 160 MG PO TABS: 80.0000 mg | ORAL_TABLET | Freq: Every day | ORAL | Status: DC

## 2012-03-22 MED ORDER — NIACIN ER (ANTIHYPERLIPIDEMIC) 500 MG PO TBCR: EXTENDED_RELEASE_TABLET | ORAL | Status: DC

## 2012-03-22 MED ORDER — EZETIMIBE-SIMVASTATIN 10-80 MG PO TABS: ORAL_TABLET | ORAL | Status: DC

## 2012-03-22 MED ORDER — POTASSIUM CHLORIDE 20 MEQ PO PACK: 20.0000 meq | PACK | ORAL | Status: DC | PRN

## 2012-03-22 MED ORDER — CARVEDILOL 25 MG PO TABS: 25.0000 mg | ORAL_TABLET | Freq: Two times a day (BID) | ORAL | Status: DC

## 2012-03-22 NOTE — Telephone Encounter (Signed)
Pt reports Employer has changed Rx Insurance to [employer] for mail order; request #[id number] for: Coreg, Niaspan, Protonix, Vytorin, Diovan, Klor-Con, Tramadol [all Done to mail order excluding Tramadol]/SLS Request 30-day to local pharmacy [[facility]] for: Niaspan, Vytorin, Protonix; Done to pharmacy/SLS  PLEASE advise on Tramadol request for [facility] mail order [pending].

## 2012-03-22 NOTE — Telephone Encounter (Signed)
Form given to AVP to update.

## 2012-03-22 NOTE — Telephone Encounter (Signed)
Ok Thx 

## 2012-03-23 NOTE — Telephone Encounter (Signed)
Form updated and faxed back on 03/22/12.

## 2012-03-24 MED ORDER — TRAMADOL HCL 50 MG PO TABS: 50.0000 mg | ORAL_TABLET | Freq: Two times a day (BID) | ORAL | Status: DC | PRN

## 2012-03-24 NOTE — Telephone Encounter (Signed)
Done

## 2012-03-31 ENCOUNTER — Other Ambulatory Visit: Payer: Self-pay | Admitting: *Deleted

## 2012-03-31 MED ORDER — POTASSIUM CHLORIDE CRYS ER 20 MEQ PO TBCR: 20.0000 meq | EXTENDED_RELEASE_TABLET | Freq: Every day | ORAL | Status: DC

## 2012-04-28 ENCOUNTER — Other Ambulatory Visit: Payer: Self-pay | Admitting: General Surgery

## 2012-04-28 DIAGNOSIS — Z139 Encounter for screening, unspecified: Secondary | ICD-10-CM

## 2012-04-28 DIAGNOSIS — R52 Pain, unspecified: Secondary | ICD-10-CM

## 2012-05-05 ENCOUNTER — Ambulatory Visit
Admission: RE | Admit: 2012-05-05 | Discharge: 2012-05-05 | Disposition: A | Discharge status: Home / Self Care | Payer: 59 | Source: Ambulatory Visit | Attending: General Surgery | Admitting: General Surgery

## 2012-05-05 DIAGNOSIS — Z139 Encounter for screening, unspecified: Secondary | ICD-10-CM

## 2012-05-05 DIAGNOSIS — R52 Pain, unspecified: Secondary | ICD-10-CM

## 2012-05-05 MED ORDER — GADOBENATE DIMEGLUMINE 529 MG/ML IV SOLN
20.0000 mL | Freq: Once | INTRAVENOUS | Status: AC | PRN
Start: 2012-05-05 — End: 2012-05-05
  Administered 2012-05-05: 20 mL via INTRAVENOUS

## 2012-06-14 ENCOUNTER — Other Ambulatory Visit (INDEPENDENT_AMBULATORY_CARE_PROVIDER_SITE_OTHER): Payer: 59

## 2012-06-14 ENCOUNTER — Encounter: Payer: Self-pay | Admitting: Internal Medicine

## 2012-06-14 ENCOUNTER — Ambulatory Visit (INDEPENDENT_AMBULATORY_CARE_PROVIDER_SITE_OTHER): Payer: 59 | Admitting: Internal Medicine

## 2012-06-14 VITALS — BP 140/80 | HR 76 | Temp 96.4°F | Resp 16 | Wt 254.0 lb

## 2012-06-14 DIAGNOSIS — I2510 Atherosclerotic heart disease of native coronary artery without angina pectoris: Secondary | ICD-10-CM

## 2012-06-14 DIAGNOSIS — I10 Essential (primary) hypertension: Secondary | ICD-10-CM

## 2012-06-14 DIAGNOSIS — M25569 Pain in unspecified knee: Secondary | ICD-10-CM

## 2012-06-14 DIAGNOSIS — Z0000 Encounter for general adult medical examination without abnormal findings: Secondary | ICD-10-CM

## 2012-06-14 DIAGNOSIS — G459 Transient cerebral ischemic attack, unspecified: Secondary | ICD-10-CM

## 2012-06-14 DIAGNOSIS — R05 Cough: Secondary | ICD-10-CM | POA: Insufficient documentation

## 2012-06-14 DIAGNOSIS — Z951 Presence of aortocoronary bypass graft: Secondary | ICD-10-CM

## 2012-06-14 DIAGNOSIS — I739 Peripheral vascular disease, unspecified: Secondary | ICD-10-CM

## 2012-06-14 DIAGNOSIS — R059 Cough, unspecified: Secondary | ICD-10-CM

## 2012-06-14 DIAGNOSIS — R7309 Other abnormal glucose: Secondary | ICD-10-CM

## 2012-06-14 DIAGNOSIS — E785 Hyperlipidemia, unspecified: Secondary | ICD-10-CM

## 2012-06-14 LAB — BASIC METABOLIC PANEL
CO2: 29 mEq/L (ref 19–32)
Calcium: 9 mg/dL (ref 8.4–10.5)
Creatinine, Ser: 0.9 mg/dL (ref 0.4–1.5)
GFR: 94.59 mL/min (ref >60.00)
Glucose, Bld: 191 mg/dL — ABNORMAL HIGH (ref 70–99)
Sodium: 136 mEq/L (ref 135–145)

## 2012-06-14 MED ORDER — FLUTICASONE FUROATE-VILANTEROL 100-25 MCG/INH IN AEPB: 1.0000 | INHALATION_SPRAY | Freq: Every day | RESPIRATORY_TRACT | Status: DC

## 2012-06-14 MED ORDER — VALSARTAN 160 MG PO TABS: 80.0000 mg | ORAL_TABLET | Freq: Every day | ORAL | Status: DC

## 2012-06-14 NOTE — Assessment & Plan Note (Signed)
No CP Continue with current prescription therapy as reflected on the Med list.  

## 2012-06-14 NOTE — Assessment & Plan Note (Signed)
Continue with current prescription therapy as reflected on the Med list.  

## 2012-06-14 NOTE — Assessment & Plan Note (Signed)
Continue with current prescription therapy as reflected on the Med list. No relapse 

## 2012-06-14 NOTE — Progress Notes (Signed)
Subjective:    HPI   The patient needs to address  chronic hypertension that has been well controlled with medicines; to address chronic  hyperlipidemia controlled with medicines as well; and to address CAD, type 2 pre diabetes, controlled with medical treatment and diet. C/o cough x 1 mo, dry  BP Readings from Last 3 Encounters:  06/14/12 140/80  01/03/12 140/82  12/13/11 130/78   Wt Readings from Last 3 Encounters:  06/14/12 254 lb (115.214 kg)  01/03/12 254 lb (115.214 kg)  12/13/11 250 lb (113.399 kg)      Review of Systems  Constitutional: Negative for chills, appetite change, fatigue and unexpected weight change.  HENT: Negative for nosebleeds, congestion, sore throat, sneezing, trouble swallowing and neck pain.   Eyes: Negative for itching and visual disturbance.  Respiratory: Negative for cough.   Cardiovascular: Negative for chest pain, palpitations and leg swelling.  Gastrointestinal: Negative for nausea, diarrhea, blood in stool and abdominal distention.  Genitourinary: Negative for frequency and hematuria.  Musculoskeletal: Positive for arthralgias. Negative for back pain, joint swelling and gait problem.  Skin: Negative for rash.  Neurological: Negative for dizziness, tremors, speech difficulty and weakness.  Psychiatric/Behavioral: Negative for sleep disturbance, dysphoric mood and agitation. The patient is not nervous/anxious.        Objective:   Physical Exam  Constitutional: He is oriented to person, place, and time. He appears well-developed and well-nourished. No distress.  HENT:  Head: Normocephalic and atraumatic.  Right Ear: External ear normal.  Left Ear: External ear normal.  Nose: Nose normal.  Mouth/Throat: Oropharynx is clear and moist. No oropharyngeal exudate.  Eyes: Conjunctivae normal and EOM are normal. Pupils are equal, round, and reactive to light. Right eye exhibits no discharge. Left eye exhibits no discharge. No scleral icterus.   Neck: Normal range of motion. Neck supple. No JVD present. No tracheal deviation present. No thyromegaly present.  Cardiovascular: Normal rate, regular rhythm, normal heart sounds and intact distal pulses.  Exam reveals no gallop and no friction rub.   No murmur heard. Pulmonary/Chest: Effort normal and breath sounds normal. No stridor. No respiratory distress. He has no wheezes. He has no rales. He exhibits no tenderness.  Abdominal: Soft. Bowel sounds are normal. He exhibits no distension and no mass. There is no tenderness. There is no rebound and no guarding.  Genitourinary: Rectum normal and prostate normal. Guaiac negative stool. No penile tenderness.       1+ prostate  Musculoskeletal: Normal range of motion. He exhibits no edema and no tenderness.  Lymphadenopathy:    He has no cervical adenopathy.  Neurological: He is alert and oriented to person, place, and time. He has normal reflexes. No cranial nerve deficit. He exhibits normal muscle tone. Coordination normal.  Skin: Skin is warm and dry. No rash noted. He is not diaphoretic. No erythema. No pallor.  Psychiatric: He has a normal mood and affect. His behavior is normal. Judgment and thought content normal.  AKs on arms Lab Results  Component Value Date   WBC 6.0 12/08/2011   HGB 14.4 12/08/2011   HCT 42.2 12/08/2011   PLT 192.0 12/08/2011   GLUCOSE 126* 12/08/2011   CHOL 149 12/08/2011   TRIG 115.0 12/08/2011   HDL 33.70* 12/08/2011   LDLCALC 92 12/08/2011   ALT 28 12/08/2011   AST 23 12/08/2011   NA 139 12/08/2011   K 4.6 12/08/2011   CL 104 12/08/2011   CREATININE 0.9 12/08/2011   BUN 11 12/08/2011  CO2 29 12/08/2011   TSH 1.85 12/08/2011   PSA 0.46 12/08/2011   HGBA1C 6.9* 07/09/2009    I personally provided the [facility] inhaler use teaching. After the teaching patient was able to demonstrate it's use effectively. All questions were answered       Assessment & Plan:

## 2012-06-14 NOTE — Assessment & Plan Note (Addendum)
Breo 1 inh qd -- Poss COPD related - ex-smoker's bronchitis

## 2012-06-15 ENCOUNTER — Other Ambulatory Visit: Payer: Self-pay | Admitting: Internal Medicine

## 2012-06-15 DIAGNOSIS — R7309 Other abnormal glucose: Secondary | ICD-10-CM

## 2012-06-15 NOTE — Telephone Encounter (Signed)
[name], please, inform patient that all labs are normal except for poor sugar control Start Metformin 1 bid RTC 3-4 mo w/BMET, A1c Thx

## 2012-06-16 NOTE — Telephone Encounter (Signed)
Left message for pt to callback office.  

## 2012-06-16 NOTE — Telephone Encounter (Signed)
Left mess for patient to call back.  

## 2012-06-22 MED ORDER — METFORMIN HCL 500 MG PO TABS: 500.0000 mg | ORAL_TABLET | Freq: Two times a day (BID) | ORAL | Status: DC

## 2012-06-22 MED ORDER — VALSARTAN 160 MG PO TABS: 80.0000 mg | ORAL_TABLET | Freq: Every day | ORAL | Status: DC

## 2012-06-22 NOTE — Telephone Encounter (Signed)
Pt informed

## 2012-07-18 ENCOUNTER — Telehealth: Payer: Self-pay | Admitting: *Deleted

## 2012-07-18 DIAGNOSIS — Z0389 Encounter for observation for other suspected diseases and conditions ruled out: Secondary | ICD-10-CM

## 2012-07-18 DIAGNOSIS — Z0000 Encounter for general adult medical examination without abnormal findings: Secondary | ICD-10-CM

## 2012-07-18 NOTE — Telephone Encounter (Signed)
Labs entered.

## 2012-07-18 NOTE — Telephone Encounter (Signed)
Message copied by Merrilyn Puma on Tue Jul 18, 2012  9:29 AM ------      Message from: Etheleen Sia      Created: Wed Jun 14, 2012  8:13 AM      Regarding: LABS       PHYSICAL IN [place]

## 2012-09-21 ENCOUNTER — Ambulatory Visit: Payer: 59 | Admitting: Internal Medicine

## 2012-09-21 DIAGNOSIS — Z0289 Encounter for other administrative examinations: Secondary | ICD-10-CM

## 2012-10-03 ENCOUNTER — Other Ambulatory Visit (INDEPENDENT_AMBULATORY_CARE_PROVIDER_SITE_OTHER): Payer: 59

## 2012-10-03 DIAGNOSIS — R7309 Other abnormal glucose: Secondary | ICD-10-CM

## 2012-10-03 LAB — BASIC METABOLIC PANEL
BUN: 13 mg/dL (ref 6–23)
Calcium: 9.2 mg/dL (ref 8.4–10.5)
Creatinine, Ser: 0.9 mg/dL (ref 0.4–1.5)
GFR: 93.27 mL/min (ref >60.00)
Glucose, Bld: 120 mg/dL — ABNORMAL HIGH (ref 70–99)

## 2012-10-04 ENCOUNTER — Encounter: Payer: Self-pay | Admitting: Internal Medicine

## 2012-10-04 ENCOUNTER — Ambulatory Visit (INDEPENDENT_AMBULATORY_CARE_PROVIDER_SITE_OTHER): Payer: 59 | Admitting: Internal Medicine

## 2012-10-04 VITALS — BP 128/86 | HR 80 | Temp 100.2°F | Resp 16 | Wt 253.0 lb

## 2012-10-04 DIAGNOSIS — E1059 Type 1 diabetes mellitus with other circulatory complications: Secondary | ICD-10-CM

## 2012-10-04 DIAGNOSIS — G459 Transient cerebral ischemic attack, unspecified: Secondary | ICD-10-CM

## 2012-10-04 DIAGNOSIS — I10 Essential (primary) hypertension: Secondary | ICD-10-CM

## 2012-10-04 DIAGNOSIS — I2510 Atherosclerotic heart disease of native coronary artery without angina pectoris: Secondary | ICD-10-CM

## 2012-10-04 DIAGNOSIS — R7309 Other abnormal glucose: Secondary | ICD-10-CM

## 2012-10-04 DIAGNOSIS — E785 Hyperlipidemia, unspecified: Secondary | ICD-10-CM

## 2012-10-04 MED ORDER — VALSARTAN 160 MG PO TABS: 160.0000 mg | ORAL_TABLET | Freq: Every day | ORAL | Status: DC

## 2012-10-04 MED ORDER — SITAGLIPTIN PHOS-METFORMIN HCL 50-500 MG PO TABS: 1.0000 | ORAL_TABLET | Freq: Two times a day (BID) | ORAL | Status: DC

## 2012-10-04 NOTE — Assessment & Plan Note (Signed)
No relapse 

## 2012-10-04 NOTE — Assessment & Plan Note (Signed)
Better  

## 2012-10-04 NOTE — Progress Notes (Signed)
Subjective:    HPI   The patient needs to address  chronic hypertension that has been well controlled with medicines; to address chronic  hyperlipidemia controlled with medicines as well; and to address CAD, type 2 pre diabetes, controlled with medical treatment and diet.   BP Readings from Last 3 Encounters:  10/04/12 128/86  06/14/12 140/80  01/03/12 140/82   Wt Readings from Last 3 Encounters:  10/04/12 253 lb (114.76 kg)  06/14/12 254 lb (115.214 kg)  01/03/12 254 lb (115.214 kg)      Review of Systems  Constitutional: Negative for chills, appetite change, fatigue and unexpected weight change.  HENT: Negative for nosebleeds, congestion, sore throat, sneezing, trouble swallowing and neck pain.   Eyes: Negative for itching and visual disturbance.  Respiratory: Negative for cough.   Cardiovascular: Negative for chest pain, palpitations and leg swelling.  Gastrointestinal: Negative for nausea, diarrhea, blood in stool and abdominal distention.  Genitourinary: Negative for frequency and hematuria.  Musculoskeletal: Positive for arthralgias. Negative for back pain, joint swelling and gait problem.  Skin: Negative for rash.  Neurological: Negative for dizziness, tremors, speech difficulty and weakness.  Psychiatric/Behavioral: Negative for sleep disturbance, dysphoric mood and agitation. The patient is not nervous/anxious.        Objective:   Physical Exam  Constitutional: He is oriented to person, place, and time. He appears well-developed and well-nourished. No distress.  HENT:  Head: Normocephalic and atraumatic.  Right Ear: External ear normal.  Left Ear: External ear normal.  Nose: Nose normal.  Mouth/Throat: Oropharynx is clear and moist. No oropharyngeal exudate.  Eyes: Conjunctivae and EOM are normal. Pupils are equal, round, and reactive to light. Right eye exhibits no discharge. Left eye exhibits no discharge. No scleral icterus.  Neck: Normal range of motion.  Neck supple. No JVD present. No tracheal deviation present. No thyromegaly present.  Cardiovascular: Normal rate, regular rhythm, normal heart sounds and intact distal pulses.  Exam reveals no gallop and no friction rub.   No murmur heard. Pulmonary/Chest: Effort normal and breath sounds normal. No stridor. No respiratory distress. He has no wheezes. He has no rales. He exhibits no tenderness.  Abdominal: Soft. Bowel sounds are normal. He exhibits no distension and no mass. There is no tenderness. There is no rebound and no guarding.  Genitourinary: Rectum normal and prostate normal. Guaiac negative stool. No penile tenderness.  1+ prostate  Musculoskeletal: Normal range of motion. He exhibits no edema and no tenderness.  Lymphadenopathy:    He has no cervical adenopathy.  Neurological: He is alert and oriented to person, place, and time. He has normal reflexes. No cranial nerve deficit. He exhibits normal muscle tone. Coordination normal.  Skin: Skin is warm and dry. No rash noted. He is not diaphoretic. No erythema. No pallor.  Psychiatric: He has a normal mood and affect. His behavior is normal. Judgment and thought content normal.  AKs on arms Lab Results  Component Value Date   WBC 6.0 12/08/2011   HGB 14.4 12/08/2011   HCT 42.2 12/08/2011   PLT 192.0 12/08/2011   GLUCOSE 120* 10/03/2012   CHOL 149 12/08/2011   TRIG 115.0 12/08/2011   HDL 33.70* 12/08/2011   LDLCALC 92 12/08/2011   ALT 28 12/08/2011   AST 23 12/08/2011   NA 139 10/03/2012   K 4.0 10/03/2012   CL 101 10/03/2012   CREATININE 0.9 10/03/2012   BUN 13 10/03/2012   CO2 27 10/03/2012   TSH 1.85 12/08/2011  PSA 0.46 12/08/2011   HGBA1C 7.8* 10/03/2012           Assessment & Plan:

## 2012-10-04 NOTE — Assessment & Plan Note (Signed)
Continue with current prescription therapy as reflected on the Med list.  

## 2012-10-04 NOTE — Assessment & Plan Note (Signed)
D/c Niacin - too $$ Continue with current prescription therapy as reflected on the Med list.

## 2012-11-08 ENCOUNTER — Other Ambulatory Visit: Payer: Self-pay | Admitting: *Deleted

## 2012-11-08 MED ORDER — VALSARTAN 160 MG PO TABS: 160.0000 mg | ORAL_TABLET | Freq: Every day | ORAL | Status: DC

## 2012-11-08 NOTE — Telephone Encounter (Signed)
[facility] called regarding Diovan Rx. They need a new rx for 1 tablet daily-per their records, rx was for 1/2 tablet daily. Rx was printed on 10/04/2012 at pt's OV for 1 tablet qd. Rx resent to [facility].

## 2012-12-13 ENCOUNTER — Encounter: Payer: 59 | Admitting: Internal Medicine

## 2012-12-14 ENCOUNTER — Other Ambulatory Visit: Payer: Self-pay | Admitting: *Deleted

## 2012-12-14 MED ORDER — CARVEDILOL 25 MG PO TABS: 25.0000 mg | ORAL_TABLET | Freq: Two times a day (BID) | ORAL | Status: DC

## 2013-02-08 ENCOUNTER — Other Ambulatory Visit (INDEPENDENT_AMBULATORY_CARE_PROVIDER_SITE_OTHER): Payer: 59

## 2013-02-08 DIAGNOSIS — I2510 Atherosclerotic heart disease of native coronary artery without angina pectoris: Secondary | ICD-10-CM

## 2013-02-08 DIAGNOSIS — E785 Hyperlipidemia, unspecified: Secondary | ICD-10-CM

## 2013-02-08 DIAGNOSIS — E1059 Type 1 diabetes mellitus with other circulatory complications: Secondary | ICD-10-CM

## 2013-02-08 DIAGNOSIS — G459 Transient cerebral ischemic attack, unspecified: Secondary | ICD-10-CM

## 2013-02-08 DIAGNOSIS — R7309 Other abnormal glucose: Secondary | ICD-10-CM

## 2013-02-08 DIAGNOSIS — Z0389 Encounter for observation for other suspected diseases and conditions ruled out: Secondary | ICD-10-CM

## 2013-02-08 DIAGNOSIS — Z0000 Encounter for general adult medical examination without abnormal findings: Secondary | ICD-10-CM

## 2013-02-08 DIAGNOSIS — I10 Essential (primary) hypertension: Secondary | ICD-10-CM

## 2013-02-08 LAB — HEPATIC FUNCTION PANEL
Alkaline Phosphatase: 52 U/L (ref 39–117)
Bilirubin, Direct: 0.1 mg/dL (ref 0.0–0.3)
Total Bilirubin: 0.9 mg/dL (ref 0.3–1.2)
Total Protein: 7.1 g/dL (ref 6.0–8.3)

## 2013-02-08 LAB — BASIC METABOLIC PANEL
CO2: 28 mEq/L (ref 19–32)
Calcium: 9.4 mg/dL (ref 8.4–10.5)
Creatinine, Ser: 1 mg/dL (ref 0.4–1.5)
GFR: 78.7 mL/min (ref >60.00)
Sodium: 138 mEq/L (ref 135–145)

## 2013-02-08 LAB — LIPID PANEL
Cholesterol: 247 mg/dL — ABNORMAL HIGH (ref 0–200)
Total CHOL/HDL Ratio: 8

## 2013-02-08 LAB — URINALYSIS, ROUTINE W REFLEX MICROSCOPIC
Specific Gravity, Urine: >=1.03 (ref 1.000–1.030)
Urine Glucose: NEGATIVE
Urobilinogen, UA: 0.2 (ref 0.0–1.0)
pH: 6 (ref 5.0–8.0)

## 2013-02-08 LAB — LDL CHOLESTEROL, DIRECT: Direct LDL: 198.1 mg/dL

## 2013-02-09 ENCOUNTER — Ambulatory Visit (INDEPENDENT_AMBULATORY_CARE_PROVIDER_SITE_OTHER): Payer: 59 | Admitting: Internal Medicine

## 2013-02-09 ENCOUNTER — Encounter: Payer: Self-pay | Admitting: Internal Medicine

## 2013-02-09 VITALS — BP 150/82 | HR 72 | Temp 98.2°F | Resp 16 | Ht 72.0 in | Wt 252.0 lb

## 2013-02-09 DIAGNOSIS — I10 Essential (primary) hypertension: Secondary | ICD-10-CM

## 2013-02-09 DIAGNOSIS — I25810 Atherosclerosis of coronary artery bypass graft(s) without angina pectoris: Secondary | ICD-10-CM

## 2013-02-09 DIAGNOSIS — G479 Sleep disorder, unspecified: Secondary | ICD-10-CM

## 2013-02-09 DIAGNOSIS — E119 Type 2 diabetes mellitus without complications: Secondary | ICD-10-CM

## 2013-02-09 DIAGNOSIS — G459 Transient cerebral ischemic attack, unspecified: Secondary | ICD-10-CM

## 2013-02-09 DIAGNOSIS — Z23 Encounter for immunization: Secondary | ICD-10-CM

## 2013-02-09 DIAGNOSIS — Z0000 Encounter for general adult medical examination without abnormal findings: Secondary | ICD-10-CM

## 2013-02-09 DIAGNOSIS — E118 Type 2 diabetes mellitus with unspecified complications: Secondary | ICD-10-CM | POA: Insufficient documentation

## 2013-02-09 DIAGNOSIS — R609 Edema, unspecified: Secondary | ICD-10-CM

## 2013-02-09 DIAGNOSIS — E1165 Type 2 diabetes mellitus with hyperglycemia: Secondary | ICD-10-CM | POA: Insufficient documentation

## 2013-02-09 MED ORDER — EZETIMIBE-SIMVASTATIN 10-80 MG PO TABS: ORAL_TABLET | ORAL | Status: DC

## 2013-02-09 MED ORDER — SITAGLIPTIN PHOS-METFORMIN HCL 50-500 MG PO TABS: 1.0000 | ORAL_TABLET | Freq: Two times a day (BID) | ORAL | Status: DC

## 2013-02-09 MED ORDER — PANTOPRAZOLE SODIUM 40 MG PO TBEC: 40.0000 mg | DELAYED_RELEASE_TABLET | Freq: Every day | ORAL | Status: DC

## 2013-02-09 NOTE — Assessment & Plan Note (Signed)
We discussed age appropriate health related issues, including available/recomended screening tests and vaccinations. We discussed a need for adhering to healthy diet and exercise. Labs/EKG were reviewed/ordered. All questions were answered.   

## 2013-02-09 NOTE — Progress Notes (Signed)
Subjective:   HPI  The patient is here for a wellness exam. The patient has been doing well overall without major physical or psychological issues going on lately.  The patient needs to address  chronic hypertension that has been well controlled with medicines; to address chronic  hyperlipidemia controlled with medicines as well; and to address CAD, type 2 pre diabetes, controlled with medical treatment and diet.   BP Readings from Last 3 Encounters:  02/09/13 150/82  10/04/12 128/86  06/14/12 140/80   Wt Readings from Last 3 Encounters:  02/09/13 252 lb (114.306 kg)  10/04/12 253 lb (114.76 kg)  06/14/12 254 lb (115.214 kg)      Review of Systems  Constitutional: Negative for chills, appetite change, fatigue and unexpected weight change.  HENT: Negative for nosebleeds, congestion, sore throat, sneezing, trouble swallowing and neck pain.   Eyes: Negative for itching and visual disturbance.  Respiratory: Negative for cough.   Cardiovascular: Negative for chest pain, palpitations and leg swelling.  Gastrointestinal: Negative for nausea, diarrhea, blood in stool and abdominal distention.  Genitourinary: Negative for frequency and hematuria.  Musculoskeletal: Positive for arthralgias. Negative for back pain, joint swelling and gait problem.  Skin: Negative for rash.  Neurological: Negative for dizziness, tremors, speech difficulty and weakness.  Psychiatric/Behavioral: Negative for sleep disturbance, dysphoric mood and agitation. The patient is not nervous/anxious.        Objective:   Physical Exam  Constitutional: He is oriented to person, place, and time. He appears well-developed and well-nourished. No distress.  HENT:  Head: Normocephalic and atraumatic.  Right Ear: External ear normal.  Left Ear: External ear normal.  Nose: Nose normal.  Mouth/Throat: Oropharynx is clear and moist. No oropharyngeal exudate.  Eyes: Conjunctivae and EOM are normal. Pupils are equal,  round, and reactive to light. Right eye exhibits no discharge. Left eye exhibits no discharge. No scleral icterus.  Neck: Normal range of motion. Neck supple. No JVD present. No tracheal deviation present. No thyromegaly present.  Cardiovascular: Normal rate, regular rhythm, normal heart sounds and intact distal pulses.  Exam reveals no gallop and no friction rub.   No murmur heard. Pulmonary/Chest: Effort normal and breath sounds normal. No stridor. No respiratory distress. He has no wheezes. He has no rales. He exhibits no tenderness.  Abdominal: Soft. Bowel sounds are normal. He exhibits no distension and no mass. There is no tenderness. There is no rebound and no guarding.  Genitourinary: Rectum normal and prostate normal. Guaiac negative stool. No penile tenderness.  1+ prostate  Musculoskeletal: Normal range of motion. He exhibits no edema and no tenderness.  Lymphadenopathy:    He has no cervical adenopathy.  Neurological: He is alert and oriented to person, place, and time. He has normal reflexes. No cranial nerve deficit. He exhibits normal muscle tone. Coordination normal.  Skin: Skin is warm and dry. No rash noted. He is not diaphoretic. No erythema. No pallor.  Psychiatric: He has a normal mood and affect. His behavior is normal. Judgment and thought content normal.  AKs on arms Lab Results  Component Value Date   WBC 6.0 12/08/2011   HGB 14.4 12/08/2011   HCT 42.2 12/08/2011   PLT 192.0 12/08/2011   GLUCOSE 125* 02/08/2013   CHOL 247* 02/08/2013   TRIG 150.0* 02/08/2013   HDL 31.20* 02/08/2013   LDLDIRECT 198.1 02/08/2013   LDLCALC 92 12/08/2011   ALT 37 02/08/2013   AST 24 02/08/2013   NA 138 02/08/2013   K 4.7  02/08/2013   CL 102 02/08/2013   CREATININE 1.0 02/08/2013   BUN 15 02/08/2013   CO2 28 02/08/2013   TSH 1.85 12/08/2011   PSA 0.80 02/08/2013   HGBA1C 7.8* 10/03/2012          Assessment & Plan:

## 2013-02-09 NOTE — Assessment & Plan Note (Signed)
NAS diet Continue with current prescription therapy as reflected on the Med list.  

## 2013-02-09 NOTE — Assessment & Plan Note (Signed)
F/u w/Dr Clance 

## 2013-02-09 NOTE — Assessment & Plan Note (Signed)
Restart Janumet

## 2013-02-09 NOTE — Assessment & Plan Note (Signed)
-   doing well

## 2013-02-09 NOTE — Assessment & Plan Note (Signed)
Continue with current prescription therapy as reflected on the Med list.  

## 2013-02-09 NOTE — Assessment & Plan Note (Signed)
Continue with current prescription therapy as reflected on the Med list. F/u w/Dr Myrtis Ser He had myalgias - out of Vytorin, states it caused myalgia possibly. Will re-start Vytorin: d/c if issues

## 2013-02-11 ENCOUNTER — Encounter: Payer: Self-pay | Admitting: Cardiology

## 2013-02-11 DIAGNOSIS — I2510 Atherosclerotic heart disease of native coronary artery without angina pectoris: Secondary | ICD-10-CM | POA: Insufficient documentation

## 2013-02-15 ENCOUNTER — Ambulatory Visit: Payer: 59 | Admitting: Cardiology

## 2013-02-27 ENCOUNTER — Ambulatory Visit (INDEPENDENT_AMBULATORY_CARE_PROVIDER_SITE_OTHER): Payer: 59 | Admitting: Cardiology

## 2013-02-27 ENCOUNTER — Encounter: Payer: Self-pay | Admitting: Cardiology

## 2013-02-27 VITALS — BP 145/70 | HR 66 | Wt 249.0 lb

## 2013-02-27 DIAGNOSIS — R55 Syncope and collapse: Secondary | ICD-10-CM

## 2013-02-27 DIAGNOSIS — I779 Disorder of arteries and arterioles, unspecified: Secondary | ICD-10-CM

## 2013-02-27 DIAGNOSIS — I10 Essential (primary) hypertension: Secondary | ICD-10-CM

## 2013-02-27 DIAGNOSIS — R0989 Other specified symptoms and signs involving the circulatory and respiratory systems: Secondary | ICD-10-CM

## 2013-02-27 DIAGNOSIS — I359 Nonrheumatic aortic valve disorder, unspecified: Secondary | ICD-10-CM

## 2013-02-27 DIAGNOSIS — I2510 Atherosclerotic heart disease of native coronary artery without angina pectoris: Secondary | ICD-10-CM

## 2013-02-27 DIAGNOSIS — R9430 Abnormal result of cardiovascular function study, unspecified: Secondary | ICD-10-CM

## 2013-02-27 DIAGNOSIS — I358 Other nonrheumatic aortic valve disorders: Secondary | ICD-10-CM

## 2013-02-27 DIAGNOSIS — R42 Dizziness and giddiness: Secondary | ICD-10-CM | POA: Insufficient documentation

## 2013-02-27 NOTE — Assessment & Plan Note (Signed)
Blood pressure is controlled. No change in therapy. 

## 2013-02-27 NOTE — Progress Notes (Signed)
HPI   The patient is seen today for cardiology followup. I saw him last July, 2012. He's been stable. He continues to work with physical work. He's not having any chest pain or shortness of breath. Over time he has had 2 episodes that have concerned him. On one occasion he felt some mild dizziness. He leaned his head over and then stood back up and felt better. On the second occasion he had a sensation that the things he was seen more brighter than usual. It's hard for him to describe it any further. There was no syncope or presyncope. There was no marked dizziness with this. He did last for 45 minutes. It resolved. On both occasions it is possible that his volume status may have been decreased. 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  No Known Allergies  Current Outpatient Prescriptions  Medication Sig Dispense Refill  . aspirin 81 MG EC tablet Take 81 mg by mouth daily.        . carvedilol (COREG) 25 MG tablet Take 1 tablet (25 mg total) by mouth 2 (two) times daily with a meal.  180 tablet  1  . Cholecalciferol (VITAMIN D3) 1000 UNITS tablet Take 1,000 Units by mouth daily.        Marland Kitchen ezetimibe-simvastatin (VYTORIN) 10-80 MG per tablet TAKE 1 TABLET DAILY  90 tablet  3  . Fluticasone Furoate-Vilanterol (BREO ELLIPTA) 100-25 MCG/INH AEPB Inhale 1 Act into the lungs daily.  1 each  5  . furosemide (LASIX) 40 MG tablet Take 40 mg by mouth as needed.        Marland Kitchen ibuprofen (ADVIL,MOTRIN) 600 MG tablet Take 1 tablet (600 mg total) by mouth as needed.  100 tablet  3  . metFORMIN (GLUCOPHAGE) 500 MG tablet Take 1 tablet by mouth 2 (two) times daily.      . pantoprazole (PROTONIX) 40 MG tablet Take 1 tablet (40 mg total) by mouth daily.  90 tablet  3  . potassium chloride (KLOR-CON) 20 MEQ packet Take 20 mEq by mouth as needed.  90 packet  1  . sitaGLIPtin-metformin (JANUMET) 50-500 MG per tablet Take 1 tablet by mouth daily.      . traMADol (ULTRAM) 50 MG tablet Take 1 tablet (50 mg total) by  mouth 2 (two) times daily as needed.  90 tablet  1  . valsartan (DIOVAN) 160 MG tablet Take 1 tablet (160 mg total) by mouth daily.  90 tablet  3   No current facility-administered medications for this visit.    History   Social History  . Marital Status: Married    Spouse Name: N/A    Number of Children: N/A  . Years of Education: N/A   Occupational History  . Not on file.   Social History Main Topics  . Smoking status: Never Smoker   . Smokeless tobacco: Not on file  . Alcohol Use: No  . Drug Use: No  . Sexual Activity: Yes   Other Topics Concern  . Not on file   Social History Narrative   No Regular exercise    Family History  Problem Relation Age of Onset  . Coronary artery disease      Male 1st degree relative <50  . Diabetes      1st degree relative  . Stroke      Male 1st degree relative<50  . Stroke Mother   . Diabetes Father   . COPD Father   .  Arthritis Father   . Heart disease Father     Past Medical History  Diagnosis Date  . Hypertension   . Peripheral vascular disease     Dr. Georganna Skeans  . TIA (transient ischemic attack)     by history  . Aortic valve sclerosis     aortic valve sclerosis  . CAD (coronary artery disease)     nuclear 2005  /    . 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 Ejection fraction < 50%     EF 40%, echo 2006, no ICD needed  / EF 40%, echo, 11/2009  . Hyperlipidemia     Low HDL  . Former cigarette smoker   . Volume overload   . Carotid artery disease     Doppler, July, 2011, stable, chronic total occlusion LCCA, retrograde flow from LECA to LICA, ,, 0-39%LICA,,, 60-79% RICA  . Sleep disorder     Dr Shelle Iron, lose weight and consider sleep study later  . Mitral regurgitation     mild, echo, 11/2009    Past Surgical History  Procedure Laterality Date  . Coronary artery bypass graft    . Colonoscopy  03/19/2009    Patient Active Problem List   Diagnosis Date Noted  . CAD (coronary artery disease)   . Diabetes type 2,  controlled 02/09/2013  . Cough 06/14/2012  . Neoplasm of uncertain behavior of skin 01/03/2012  . Well adult exam 12/13/2011  . Hypertension   . Peripheral vascular disease   . TIA (transient ischemic attack)   . Aortic valve sclerosis   . Ejection fraction < 50%   . Hyperlipidemia   . Volume overload   . Carotid artery disease   . Sleep disorder   . Mitral regurgitation   . BRONCHITIS 05/15/2010  . KNEE PAIN 05/15/2010  . ACTINIC KERATOSIS 01/19/2010  . NECK PAIN 01/19/2010  . LEG PAIN 09/30/2009  . SKIN RASH 09/30/2009  . HYPERGLYCEMIA 07/11/2009  . SNORING 01/15/2009  . ABNORMAL LABS 01/15/2009  . OTITIS MEDIA, SEROUS 05/01/2008  . 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    ROS   Patient denies fever, chills, headache, sweats, rash, change in hearing, chest pain, cough, nausea or vomiting, urinary symptoms. All other systems are reviewed and are negative.  PHYSICAL EXAM   Patient is overweight but stable. He is oriented to person time and place. Affect is normal. There is no jugulovenous distention. There is a loud right carotid bruit. Cardiac exam reveals S1 and S2. There no clicks or significant murmurs. The abdomen is soft. There is no peripheral edema. There no musculoskeletal deformities. There are no skin rashes.  Filed Vitals:   02/27/13 1607  BP: 145/70  Pulse: 66  Weight: 249 lb (112.946 kg)     ASSESSMENT & PLAN

## 2013-02-27 NOTE — Assessment & Plan Note (Signed)
The patient has known significant carotid disease. His last Doppler was April, 2013. He has chronic total left common carotid occlusion. The left internal carotid artery is widely patent and is fed retrograde from the left external carotid artery. There was 60-79% right internal carotid artery stenosis. The bruit on the right neck is loud. We will obtain a Doppler soon to be sure that he has not had marked change that is possibly causing any of his symptoms.

## 2013-02-27 NOTE — Assessment & Plan Note (Addendum)
I am concerned about the symptoms that the patient has had. He's not had true syncope or presyncope. He did have some dizziness at one point. He had some type of visual change this lasted for 45 seconds. There is no definite proof of an arrhythmia. However with his left ventricular dysfunction, event recorder is appropriate. In addition he will have a carotid Doppler to be sure that he does not have marked change causing problems. Also, it is possible that his volume status may be playing a role. It is important that he maintains good hydration when he is working in the setting of heat and excess fluid loss.  As part of today's evaluation I spent greater than 25 minutes for this total care. More than half of this time was spent with direct consultation with him trying to work through all of the issues of concern.

## 2013-02-27 NOTE — Patient Instructions (Addendum)
Your physician has recommended that you wear an event monitor. Event monitors are medical devices that record the heart's electrical activity. Doctors most often [place] these monitors to diagnose arrhythmias. Arrhythmias are problems with the speed or rhythm of the heartbeat. The monitor is a small, portable device. You can wear one while you do your normal daily activities. This is usually used to diagnose what is causing palpitations/syncope (passing out).  Your physician has requested that you have a carotid duplex. This test is an ultrasound of the carotid arteries in your neck. It looks at blood flow through these arteries that supply the brain with blood. Allow one hour for this exam. There are no restrictions or special instructions. Please schedule within a week.  Your physician recommends that you schedule a follow-up appointment in: 3 weeks  Reminder: Please increase your fluid intake and try to stay hydrated.

## 2013-02-27 NOTE — Assessment & Plan Note (Signed)
Coronary disease is stable. He's not having significant symptoms. His last nuclear scan was July, 2012. There was no ischemia at that time. He does have known left ventricular dysfunction. No further workup at this time.

## 2013-02-27 NOTE — Assessment & Plan Note (Signed)
The patient has a history of mild aortic valve sclerosis. His physical exam is not consistent with severe aortic stenosis. I have not yet plan a followup echo.

## 2013-02-27 NOTE — Assessment & Plan Note (Signed)
His last echo his ejection fraction was 40%. He will need a followup echo. I did not arrange this yet.

## 2013-03-07 ENCOUNTER — Other Ambulatory Visit: Payer: Self-pay | Admitting: Internal Medicine

## 2013-03-19 ENCOUNTER — Other Ambulatory Visit: Payer: Self-pay | Admitting: Internal Medicine

## 2013-03-22 ENCOUNTER — Encounter (INDEPENDENT_AMBULATORY_CARE_PROVIDER_SITE_OTHER): Payer: 59

## 2013-03-22 ENCOUNTER — Encounter: Payer: Self-pay | Admitting: *Deleted

## 2013-03-22 DIAGNOSIS — R0989 Other specified symptoms and signs involving the circulatory and respiratory systems: Secondary | ICD-10-CM

## 2013-03-22 DIAGNOSIS — I6529 Occlusion and stenosis of unspecified carotid artery: Secondary | ICD-10-CM

## 2013-03-22 DIAGNOSIS — I779 Disorder of arteries and arterioles, unspecified: Secondary | ICD-10-CM

## 2013-03-22 DIAGNOSIS — R42 Dizziness and giddiness: Secondary | ICD-10-CM

## 2013-03-22 DIAGNOSIS — R55 Syncope and collapse: Secondary | ICD-10-CM

## 2013-03-22 NOTE — Progress Notes (Signed)
Patient ID: [name], male   DOB: Dec 20, 1955, 57 y.o.   MRN: [id number] E-Cardio Verite 30 day cardiac event monitor applied to patient.

## 2013-03-27 ENCOUNTER — Encounter: Payer: Self-pay | Admitting: Cardiology

## 2013-03-30 ENCOUNTER — Ambulatory Visit (INDEPENDENT_AMBULATORY_CARE_PROVIDER_SITE_OTHER): Payer: 59 | Admitting: Cardiology

## 2013-03-30 ENCOUNTER — Encounter: Payer: Self-pay | Admitting: Cardiology

## 2013-03-30 VITALS — BP 128/72 | HR 74 | Ht 72.0 in | Wt 250.0 lb

## 2013-03-30 DIAGNOSIS — R42 Dizziness and giddiness: Secondary | ICD-10-CM

## 2013-03-30 DIAGNOSIS — I779 Disorder of arteries and arterioles, unspecified: Secondary | ICD-10-CM

## 2013-03-30 DIAGNOSIS — I493 Ventricular premature depolarization: Secondary | ICD-10-CM | POA: Insufficient documentation

## 2013-03-30 DIAGNOSIS — I4949 Other premature depolarization: Secondary | ICD-10-CM

## 2013-03-30 NOTE — Assessment & Plan Note (Signed)
Carotid disease is stable. He has significant disease but the Doppler done earlier this month shows that it is stable.

## 2013-03-30 NOTE — Assessment & Plan Note (Addendum)
We're using the term dizziness for the spells that he is having a slight change in his vision. He did have a brief 1 today. Monitor data is being accessed.  The patient strip is obtained. He had typed in lightheadedness. The timing on the recording is 9:08 AM. This is because the central recording station is 3 hours earlier than [place]. There is normal sinus rhythm. There is no significant abnormality on the monitor.  The patient will continue to wear the monitor and be in touch. All then see him back with his monitors complete.

## 2013-03-30 NOTE — Patient Instructions (Addendum)
Your physician recommends that you continue on your current medications as directed. Please refer to the Current Medication list given to you today.  Your physician recommends that you schedule a follow-up appointment in: after October 17

## 2013-03-30 NOTE — Progress Notes (Signed)
HPI  Patient returns today to followup his coronary disease and some slight dizzy spells. When I saw him last, decision was made to repeat his carotid Doppler. He does have significant disease but it is stable. In addition he is wearing an event recorder. I have 2 or 3 days of recording so far. With random strips he has had some scattered PVCs. He's had no bradycardia and tachyarrhythmias. Today around noon he had the sensation that he has had before. There is a sudden slight change in his vision. This rapidly clears. He had this today and pushed the recorder.  No Known Allergies  Current Outpatient Prescriptions  Medication Sig Dispense Refill  . aspirin 81 MG EC tablet Take 81 mg by mouth daily.        . carvedilol (COREG) 25 MG tablet Take 1 tablet (25 mg total) by mouth 2 (two) times daily with a meal.  180 tablet  1  . Cholecalciferol (VITAMIN D3) 1000 UNITS tablet Take 1,000 Units by mouth daily.        . Fluticasone Furoate-Vilanterol (BREO ELLIPTA) 100-25 MCG/INH AEPB Inhale 1 Act into the lungs daily.  1 each  5  . furosemide (LASIX) 40 MG tablet Take 40 mg by mouth as needed.        Marland Kitchen ibuprofen (ADVIL,MOTRIN) 600 MG tablet Take 1 tablet (600 mg total) by mouth as needed.  100 tablet  3  . metFORMIN (GLUCOPHAGE) 500 MG tablet Take 1 tablet by mouth 2 (two) times daily.      . pantoprazole (PROTONIX) 40 MG tablet Take 1 tablet (40 mg total) by mouth daily.  90 tablet  3  . potassium chloride (KLOR-CON) 20 MEQ packet Take 20 mEq by mouth as needed.  90 packet  1  . traMADol (ULTRAM) 50 MG tablet Take 1 tablet (50 mg total) by mouth 2 (two) times daily as needed.  90 tablet  1  . valsartan (DIOVAN) 160 MG tablet Take 1 tablet (160 mg total) by mouth daily.  90 tablet  3  . VYTORIN 10-80 MG per tablet TAKE 1 TABLET BY MOUTH EVERY DAY  90 tablet  3   No current facility-administered medications for this visit.    History   Social History  . Marital Status: Married    Spouse Name:  N/A    Number of Children: N/A  . Years of Education: N/A   Occupational History  . Not on file.   Social History Main Topics  . Smoking status: Never Smoker   . Smokeless tobacco: Not on file  . Alcohol Use: No  . Drug Use: No  . Sexual Activity: Yes   Other Topics Concern  . Not on file   Social History Narrative   No Regular exercise    Family History  Problem Relation Age of Onset  . Coronary artery disease      Male 1st degree relative <50  . Diabetes      1st degree relative  . Stroke      Male 1st degree relative<50  . Stroke Mother   . Diabetes Father   . COPD Father   . Arthritis Father   . Heart disease Father     Past Medical History  Diagnosis Date  . Hypertension   . Peripheral vascular disease     Dr. Georganna Skeans  . TIA (transient ischemic attack)     by history  . Aortic valve sclerosis     aortic  valve sclerosis  . CAD (coronary artery disease)     nuclear 2005  /    . 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 Ejection fraction < 50%     EF 40%, echo 2006, no ICD needed  / EF 40%, echo, 11/2009  . Hyperlipidemia     Low HDL  . Former cigarette smoker   . Volume overload   . Carotid artery disease     Doppler, July, 2011, stable, chronic total occlusion LCCA, retrograde flow from LECA to LICA, ,, 0-39%LICA,,, 60-79% RICA  . Sleep disorder     Dr Shelle Iron, lose weight and consider sleep study later  . Mitral regurgitation     mild, echo, 11/2009  . Dizziness     Past Surgical History  Procedure Laterality Date  . Coronary artery bypass graft    . Colonoscopy  03/19/2009    Patient Active Problem List   Diagnosis Date Noted  . Dizziness   . CAD (coronary artery disease)   . Diabetes type 2, controlled 02/09/2013  . Cough 06/14/2012  . Neoplasm of uncertain behavior of skin 01/03/2012  . Well adult exam 12/13/2011  . Hypertension   . Peripheral vascular disease   . TIA (transient ischemic attack)   . Aortic valve sclerosis   . Ejection  fraction < 50%   . Hyperlipidemia   . Volume overload   . Carotid artery disease   . Sleep disorder   . Mitral regurgitation   . BRONCHITIS 05/15/2010  . KNEE PAIN 05/15/2010  . ACTINIC KERATOSIS 01/19/2010  . NECK PAIN 01/19/2010  . LEG PAIN 09/30/2009  . SKIN RASH 09/30/2009  . HYPERGLYCEMIA 07/11/2009  . SNORING 01/15/2009  . ABNORMAL LABS 01/15/2009  . OTITIS MEDIA, SEROUS 05/01/2008  . 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    ROS   Patient denies fever, chills, headache, sweats, rash, change in vision, change in hearing, chest pain, cough, nausea vomiting, urinary symptoms. All other systems are reviewed and are negative.  PHYSICAL EXAM  Patient's oriented to person time and place. Affect is normal. He is overweight. He stable. There is no jugulovenous distention. He has loud carotid bruits that are known. Lung exam is clear. There is no respiratory distress. Cardiac exam reveals S1 and S2. There no clicks or significant murmurs. The abdomen is soft. There is no peripheral edema.  Filed Vitals:   03/30/13 1431  BP: 128/72  Pulse: 74  Height: 6' (1.829 m)  Weight: 250 lb (113.399 kg)   I have reviewed the monitor strips that are available.  ASSESSMENT & PLAN

## 2013-04-18 ENCOUNTER — Other Ambulatory Visit: Payer: Self-pay | Admitting: Internal Medicine

## 2013-05-04 ENCOUNTER — Encounter (INDEPENDENT_AMBULATORY_CARE_PROVIDER_SITE_OTHER): Payer: Self-pay

## 2013-05-04 ENCOUNTER — Ambulatory Visit (INDEPENDENT_AMBULATORY_CARE_PROVIDER_SITE_OTHER): Payer: 59 | Admitting: Cardiology

## 2013-05-04 ENCOUNTER — Encounter: Payer: Self-pay | Admitting: Cardiology

## 2013-05-04 VITALS — BP 124/76 | HR 70 | Ht 72.0 in | Wt 242.0 lb

## 2013-05-04 DIAGNOSIS — Z23 Encounter for immunization: Secondary | ICD-10-CM

## 2013-05-04 DIAGNOSIS — E8770 Fluid overload, unspecified: Secondary | ICD-10-CM

## 2013-05-04 DIAGNOSIS — R42 Dizziness and giddiness: Secondary | ICD-10-CM

## 2013-05-04 DIAGNOSIS — R9430 Abnormal result of cardiovascular function study, unspecified: Secondary | ICD-10-CM

## 2013-05-04 DIAGNOSIS — I779 Disorder of arteries and arterioles, unspecified: Secondary | ICD-10-CM

## 2013-05-04 DIAGNOSIS — G4762 Sleep related leg cramps: Secondary | ICD-10-CM

## 2013-05-04 DIAGNOSIS — E8779 Other fluid overload: Secondary | ICD-10-CM

## 2013-05-04 DIAGNOSIS — L819 Disorder of pigmentation, unspecified: Secondary | ICD-10-CM | POA: Insufficient documentation

## 2013-05-04 DIAGNOSIS — R0609 Other forms of dyspnea: Secondary | ICD-10-CM

## 2013-05-04 DIAGNOSIS — Z0000 Encounter for general adult medical examination without abnormal findings: Secondary | ICD-10-CM

## 2013-05-04 DIAGNOSIS — I493 Ventricular premature depolarization: Secondary | ICD-10-CM

## 2013-05-04 DIAGNOSIS — I10 Essential (primary) hypertension: Secondary | ICD-10-CM

## 2013-05-04 DIAGNOSIS — E785 Hyperlipidemia, unspecified: Secondary | ICD-10-CM

## 2013-05-04 DIAGNOSIS — I2510 Atherosclerotic heart disease of native coronary artery without angina pectoris: Secondary | ICD-10-CM

## 2013-05-04 DIAGNOSIS — I4949 Other premature depolarization: Secondary | ICD-10-CM

## 2013-05-04 DIAGNOSIS — R0989 Other specified symptoms and signs involving the circulatory and respiratory systems: Secondary | ICD-10-CM

## 2013-05-04 LAB — CBC WITH DIFFERENTIAL/PLATELET
Basophils Absolute: 0 10*3/uL (ref 0.0–0.1)
Eosinophils Relative: 0.5 % (ref 0.0–5.0)
Lymphocytes Relative: 27.1 % (ref 12.0–46.0)
Lymphs Abs: 2.7 10*3/uL (ref 0.7–4.0)
Monocytes Relative: 7.3 % (ref 3.0–12.0)
Neutrophils Relative %: 64.8 % (ref 43.0–77.0)
Platelets: 215 10*3/uL (ref 150.0–400.0)
RDW: 13 % (ref 11.5–14.6)
WBC: 10.1 10*3/uL (ref 4.5–10.5)

## 2013-05-04 LAB — HEPATIC FUNCTION PANEL
ALT: 32 U/L (ref 0–53)
Alkaline Phosphatase: 53 U/L (ref 39–117)
Bilirubin, Direct: 0.1 mg/dL (ref 0.0–0.3)
Total Bilirubin: 0.5 mg/dL (ref 0.3–1.2)

## 2013-05-04 LAB — BASIC METABOLIC PANEL
CO2: 29 mEq/L (ref 19–32)
Calcium: 9.4 mg/dL (ref 8.4–10.5)
Chloride: 100 mEq/L (ref 96–112)
Potassium: 4.3 mEq/L (ref 3.5–5.1)
Sodium: 136 mEq/L (ref 135–145)

## 2013-05-04 NOTE — Assessment & Plan Note (Addendum)
Coronary disease is stable clinically. No further workup.  As part of today's evaluation I spent greater than 25 minutes with is total care. More than half of this time has been spent discussing multiple issues with him. We discussed his rhythm. We discussed the concept of ICD.

## 2013-05-04 NOTE — Assessment & Plan Note (Signed)
Patient has PVCs throughout his event recorder. There no couplets or evidence of ventricular tachycardia.

## 2013-05-04 NOTE — Progress Notes (Signed)
HPI  Patient returns today to followup coronary disease and some mild dizzy spells and PVCs. He wore an event recorder. I personally reviewed it. He was a 21 day monitor. The patient had no marked symptoms. He still has mild dizziness. He has not had syncope or presyncope. He does not feel palpitations. The monitor showed scattered PVCs. There were no couplets or evidence of ventricular tachycardia. Overall he is doing well.  Patient mentioned that he is having nighttime leg cramps. He has taken some over-the-counter potassium. He receives his diuretic only on a very limited basis if he has any edema. He has not required diuretic in several weeks.  The patient's wife mentions that he has a yellowish tint to his color from time to time. He has not noticed this.  No Known Allergies  Current Outpatient Prescriptions  Medication Sig Dispense Refill  . aspirin 81 MG EC tablet Take 81 mg by mouth daily.        . carvedilol (COREG) 25 MG tablet TAKE 1 TABLET TWICE A DAY WITH MEALS  180 tablet  0  . Cholecalciferol (VITAMIN D3) 1000 UNITS tablet Take 1,000 Units by mouth daily.        . Fluticasone Furoate-Vilanterol (BREO ELLIPTA) 100-25 MCG/INH AEPB Inhale 1 Act into the lungs daily.  1 each  5  . furosemide (LASIX) 40 MG tablet Take 40 mg by mouth as needed.        Marland Kitchen ibuprofen (ADVIL,MOTRIN) 600 MG tablet Take 1 tablet (600 mg total) by mouth as needed.  100 tablet  3  . metFORMIN (GLUCOPHAGE) 500 MG tablet Take 1 tablet by mouth 2 (two) times daily.      . pantoprazole (PROTONIX) 40 MG tablet Take 1 tablet (40 mg total) by mouth daily.  90 tablet  3  . potassium chloride (KLOR-CON) 20 MEQ packet Take 20 mEq by mouth as needed.  90 packet  1  . traMADol (ULTRAM) 50 MG tablet Take 1 tablet (50 mg total) by mouth 2 (two) times daily as needed.  90 tablet  1  . valsartan (DIOVAN) 160 MG tablet Take 1 tablet (160 mg total) by mouth daily.  90 tablet  3  . VYTORIN 10-80 MG per tablet TAKE 1 TABLET  BY MOUTH EVERY DAY  90 tablet  3   No current facility-administered medications for this visit.    History   Social History  . Marital Status: Married    Spouse Name: N/A    Number of Children: N/A  . Years of Education: N/A   Occupational History  . Not on file.   Social History Main Topics  . Smoking status: Never Smoker   . Smokeless tobacco: Not on file  . Alcohol Use: No  . Drug Use: No  . Sexual Activity: Yes   Other Topics Concern  . Not on file   Social History Narrative   No Regular exercise    Family History  Problem Relation Age of Onset  . Coronary artery disease      Male 1st degree relative <50  . Diabetes      1st degree relative  . Stroke      Male 1st degree relative<50  . Stroke Mother   . Diabetes Father   . COPD Father   . Arthritis Father   . Heart disease Father     Past Medical History  Diagnosis Date  . Hypertension   . Peripheral vascular disease  Dr. Georganna Skeans  . TIA (transient ischemic attack)     by history  . Aortic valve sclerosis     aortic valve sclerosis  . CAD (coronary artery disease)     nuclear 2005  /    . 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 Ejection fraction < 50%     EF 40%, echo 2006, no ICD needed  / EF 40%, echo, 11/2009  . Hyperlipidemia     Low HDL  . Former cigarette smoker   . Volume overload   . Carotid artery disease     Doppler, July, 2011, stable, chronic total occlusion LCCA, retrograde flow from LECA to LICA, ,, 0-39%LICA,,, 60-79% RICA  . Sleep disorder     Dr Shelle Iron, lose weight and consider sleep study later  . Mitral regurgitation     mild, echo, 11/2009  . Dizziness     Past Surgical History  Procedure Laterality Date  . Coronary artery bypass graft    . Colonoscopy  03/19/2009    Patient Active Problem List   Diagnosis Date Noted  . Discoloration of skin 05/04/2013  . Nocturnal leg cramps 05/04/2013  . PVC's (premature ventricular contractions) 03/30/2013  . Dizziness   . CAD  (coronary artery disease)   . Diabetes type 2, controlled 02/09/2013  . Cough 06/14/2012  . Neoplasm of uncertain behavior of skin 01/03/2012  . Well adult exam 12/13/2011  . Hypertension   . Peripheral vascular disease   . TIA (transient ischemic attack)   . Aortic valve sclerosis   . Ejection fraction < 50%   . Hyperlipidemia   . Volume overload   . Carotid artery disease   . Sleep disorder   . Mitral regurgitation   . BRONCHITIS 05/15/2010  . KNEE PAIN 05/15/2010  . ACTINIC KERATOSIS 01/19/2010  . NECK PAIN 01/19/2010  . LEG PAIN 09/30/2009  . SKIN RASH 09/30/2009  . HYPERGLYCEMIA 07/11/2009  . SNORING 01/15/2009  . ABNORMAL LABS 01/15/2009  . OTITIS MEDIA, SEROUS 05/01/2008  . 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    ROS   Patient denies fever, chills, headache, sweats, rash, change in vision, change in hearing, chest pain, cough, nausea vomiting, urinary symptoms. All other systems are reviewed and are negative.  PHYSICAL EXAM  Patient is oriented to person time and place. Affect is normal. His skin color today appears normal. There is no jugulovenous distention. Lungs are clear. Respiratory effort is nonlabored. He is overweight. Cardiac exam reveals S1 and S2. There no clicks or significant murmurs. The abdomen is soft. There is no peripheral edema. There no musculoskeletal deformities. There are no skin rashes.  Filed Vitals:   05/04/13 1432  BP: 124/76  Pulse: 70  Height: 6' (1.829 m)  Weight: 242 lb (109.77 kg)  SpO2: 98%     ASSESSMENT & PLAN

## 2013-05-04 NOTE — Assessment & Plan Note (Signed)
Blood pressure is controlled. No change in therapy. 

## 2013-05-04 NOTE — Assessment & Plan Note (Deleted)
Over time I will discuss with him again whether he should have a formal sleep study.

## 2013-05-04 NOTE — Assessment & Plan Note (Signed)
He has had rare episodes of slight edema. He uses his diuretic only been. This has not been a problem recently.

## 2013-05-04 NOTE — Assessment & Plan Note (Signed)
He has not had any major return of his dizziness. No further workup.

## 2013-05-04 NOTE — Assessment & Plan Note (Signed)
He has significant carotid disease. This is being followed carefully.

## 2013-05-04 NOTE — Assessment & Plan Note (Signed)
Labs will be checked. I suspect this night cramps are more related to what he is done during the day.

## 2013-05-04 NOTE — Assessment & Plan Note (Signed)
2-D echo be done to reassess LV function. Clinically he stable. However if he has had a reduction in his left trigger function he may be a candidate for an ICD. He is aware of this. I will be in touch with him with the information.

## 2013-05-04 NOTE — Assessment & Plan Note (Signed)
Patient's wife question some intermittent change in skin color. General labs will be checked.

## 2013-05-04 NOTE — Patient Instructions (Signed)
**Note De-identified [name] Obfuscation** Your physician has requested that you have an echocardiogram. Echocardiography is a painless test that uses sound waves to create images of your heart. It provides your doctor with information about the size and shape of your heart and how well your heart's chambers and valves are working. This procedure takes approximately one hour. There are no restrictions for this procedure.  Your physician recommends that you return for lab work in: today   Your physician wants you to follow-up in: 6 months  You will receive a reminder letter in the mail two months in advance. If you don't receive a letter, please call our office to schedule the follow-up appointment.   

## 2013-05-11 ENCOUNTER — Telehealth: Payer: Self-pay | Admitting: Cardiology

## 2013-05-11 NOTE — Telephone Encounter (Signed)
Left message to call back  

## 2013-05-11 NOTE — Telephone Encounter (Signed)
Follow Up ° °Pt returned call//  °

## 2013-05-11 NOTE — Telephone Encounter (Signed)
Spoke with pt and reviewed lab results with him. 

## 2013-05-11 NOTE — Telephone Encounter (Signed)
New Problem:  Pt states he is calling for his recent test results. Please advise

## 2013-05-18 ENCOUNTER — Ambulatory Visit (HOSPITAL_COMMUNITY): Payer: 59 | Attending: Cardiology | Admitting: Radiology

## 2013-05-18 DIAGNOSIS — I2510 Atherosclerotic heart disease of native coronary artery without angina pectoris: Secondary | ICD-10-CM | POA: Insufficient documentation

## 2013-05-18 DIAGNOSIS — I4949 Other premature depolarization: Secondary | ICD-10-CM

## 2013-05-18 NOTE — Progress Notes (Signed)
Echocardiogram performed.  

## 2013-05-22 ENCOUNTER — Encounter: Payer: Self-pay | Admitting: Cardiology

## 2013-06-24 ENCOUNTER — Other Ambulatory Visit: Payer: Self-pay | Admitting: Internal Medicine

## 2013-07-09 ENCOUNTER — Other Ambulatory Visit: Payer: Self-pay | Admitting: Internal Medicine

## 2013-07-24 ENCOUNTER — Telehealth: Payer: Self-pay | Admitting: *Deleted

## 2013-07-24 NOTE — Telephone Encounter (Signed)
Vytorin PA is approved 07/19/13 until 07/04/2038.

## 2013-08-13 ENCOUNTER — Encounter: Payer: Self-pay | Admitting: Internal Medicine

## 2013-08-13 ENCOUNTER — Ambulatory Visit (INDEPENDENT_AMBULATORY_CARE_PROVIDER_SITE_OTHER): Payer: BC Managed Care – PPO | Admitting: Internal Medicine

## 2013-08-13 ENCOUNTER — Other Ambulatory Visit (INDEPENDENT_AMBULATORY_CARE_PROVIDER_SITE_OTHER): Payer: BC Managed Care – PPO

## 2013-08-13 VITALS — BP 110/70 | HR 72 | Temp 99.3°F | Resp 16 | Wt 249.0 lb

## 2013-08-13 DIAGNOSIS — G459 Transient cerebral ischemic attack, unspecified: Secondary | ICD-10-CM

## 2013-08-13 DIAGNOSIS — G479 Sleep disorder, unspecified: Secondary | ICD-10-CM

## 2013-08-13 DIAGNOSIS — I739 Peripheral vascular disease, unspecified: Secondary | ICD-10-CM

## 2013-08-13 DIAGNOSIS — R609 Edema, unspecified: Secondary | ICD-10-CM

## 2013-08-13 DIAGNOSIS — I2510 Atherosclerotic heart disease of native coronary artery without angina pectoris: Secondary | ICD-10-CM

## 2013-08-13 DIAGNOSIS — I25810 Atherosclerosis of coronary artery bypass graft(s) without angina pectoris: Secondary | ICD-10-CM

## 2013-08-13 DIAGNOSIS — E785 Hyperlipidemia, unspecified: Secondary | ICD-10-CM

## 2013-08-13 DIAGNOSIS — I779 Disorder of arteries and arterioles, unspecified: Secondary | ICD-10-CM

## 2013-08-13 DIAGNOSIS — I10 Essential (primary) hypertension: Secondary | ICD-10-CM

## 2013-08-13 DIAGNOSIS — E119 Type 2 diabetes mellitus without complications: Secondary | ICD-10-CM

## 2013-08-13 DIAGNOSIS — L570 Actinic keratosis: Secondary | ICD-10-CM

## 2013-08-13 DIAGNOSIS — Z0000 Encounter for general adult medical examination without abnormal findings: Secondary | ICD-10-CM

## 2013-08-13 DIAGNOSIS — Z23 Encounter for immunization: Secondary | ICD-10-CM

## 2013-08-13 LAB — BASIC METABOLIC PANEL
BUN: 12 mg/dL (ref 6–23)
CHLORIDE: 103 meq/L (ref 96–112)
CO2: 27 meq/L (ref 19–32)
CREATININE: 0.9 mg/dL (ref 0.4–1.5)
Calcium: 9.4 mg/dL (ref 8.4–10.5)
GFR: 91.8 mL/min (ref >60.00)
Glucose, Bld: 121 mg/dL — ABNORMAL HIGH (ref 70–99)
Potassium: 4.6 mEq/L (ref 3.5–5.1)
Sodium: 139 mEq/L (ref 135–145)

## 2013-08-13 LAB — HEPATIC FUNCTION PANEL
ALBUMIN: 4.2 g/dL (ref 3.5–5.2)
ALT: 29 U/L (ref 0–53)
AST: 19 U/L (ref 0–37)
Alkaline Phosphatase: 45 U/L (ref 39–117)
BILIRUBIN DIRECT: 0.1 mg/dL (ref 0.0–0.3)
TOTAL PROTEIN: 7.8 g/dL (ref 6.0–8.3)
Total Bilirubin: 0.7 mg/dL (ref 0.3–1.2)

## 2013-08-13 LAB — LIPID PANEL
CHOLESTEROL: 128 mg/dL (ref 0–200)
HDL: 36.9 mg/dL — ABNORMAL LOW (ref >39.00)
LDL CALC: 69 mg/dL (ref 0–99)
TRIGLYCERIDES: 111 mg/dL (ref 0.0–149.0)
Total CHOL/HDL Ratio: 3
VLDL: 22.2 mg/dL (ref 0.0–40.0)

## 2013-08-13 LAB — HEMOGLOBIN A1C: Hgb A1c MFr Bld: 7 % — ABNORMAL HIGH (ref 4.6–6.5)

## 2013-08-13 MED ORDER — VALSARTAN 160 MG PO TABS: 160.0000 mg | ORAL_TABLET | Freq: Every day | ORAL | Status: DC

## 2013-08-13 MED ORDER — TRAMADOL HCL 50 MG PO TABS: 50.0000 mg | ORAL_TABLET | Freq: Two times a day (BID) | ORAL | Status: DC | PRN

## 2013-08-13 MED ORDER — FUROSEMIDE 40 MG PO TABS: 40.0000 mg | ORAL_TABLET | ORAL | Status: DC | PRN

## 2013-08-13 MED ORDER — PANTOPRAZOLE SODIUM 40 MG PO TBEC: DELAYED_RELEASE_TABLET | ORAL | Status: DC

## 2013-08-13 MED ORDER — EZETIMIBE-SIMVASTATIN 10-80 MG PO TABS: ORAL_TABLET | ORAL | Status: DC

## 2013-08-13 MED ORDER — CARVEDILOL 25 MG PO TABS: ORAL_TABLET | ORAL | Status: DC

## 2013-08-13 MED ORDER — POTASSIUM CHLORIDE CRYS ER 20 MEQ PO TBCR: 20.0000 meq | EXTENDED_RELEASE_TABLET | Freq: Every day | ORAL | Status: DC

## 2013-08-13 MED ORDER — METFORMIN HCL 500 MG PO TABS: 500.0000 mg | ORAL_TABLET | Freq: Two times a day (BID) | ORAL | Status: DC

## 2013-08-13 NOTE — Assessment & Plan Note (Signed)
Continue with current prescription therapy as reflected on the Med list.  

## 2013-08-13 NOTE — Assessment & Plan Note (Signed)
No relapsed 

## 2013-08-13 NOTE — Assessment & Plan Note (Signed)
cryo 

## 2013-08-13 NOTE — Progress Notes (Signed)
Pre visit review using our clinic review tool, if applicable. No additional management support is needed unless otherwise documented below in the visit note. 

## 2013-08-13 NOTE — Progress Notes (Signed)
Patient ID: [name], male   DOB: [birth year], 58 y.o.   MRN: [id number]  Subjective:   HPI    The patient needs to address  chronic hypertension that has been well controlled with medicines; to address chronic  hyperlipidemia controlled with medicines as well; and to address CAD, type 2 pre diabetes, controlled with medical treatment and diet.   BP Readings from Last 3 Encounters:  08/13/13 110/70  05/04/13 124/76  03/30/13 128/72   Wt Readings from Last 3 Encounters:  08/13/13 249 lb (112.946 kg)  05/04/13 242 lb (109.77 kg)  03/30/13 250 lb (113.399 kg)      Review of Systems  Constitutional: Negative for chills, appetite change, fatigue and unexpected weight change.  HENT: Negative for congestion, nosebleeds, sneezing, sore throat and trouble swallowing.   Eyes: Negative for itching and visual disturbance.  Respiratory: Negative for cough.   Cardiovascular: Negative for chest pain, palpitations and leg swelling.  Gastrointestinal: Negative for nausea, diarrhea, blood in stool and abdominal distention.  Genitourinary: Negative for frequency and hematuria.  Musculoskeletal: Positive for arthralgias. Negative for back pain, gait problem, joint swelling and neck pain.  Skin: Negative for rash.  Neurological: Negative for dizziness, tremors, speech difficulty and weakness.  Psychiatric/Behavioral: Negative for sleep disturbance, dysphoric mood and agitation. The patient is not nervous/anxious.        Objective:   Physical Exam  Constitutional: He is oriented to person, place, and time. He appears well-developed and well-nourished. No distress.  HENT:  Head: Normocephalic and atraumatic.  Right Ear: External ear normal.  Left Ear: External ear normal.  Nose: Nose normal.  Mouth/Throat: Oropharynx is clear and moist. No oropharyngeal exudate.  Eyes: Conjunctivae and EOM are normal. Pupils are equal, round, and reactive to light. Right eye exhibits no discharge. Left eye  exhibits no discharge. No scleral icterus.  Neck: Normal range of motion. Neck supple. No JVD present. No tracheal deviation present. No thyromegaly present.  Cardiovascular: Normal rate, regular rhythm, normal heart sounds and intact distal pulses.  Exam reveals no gallop and no friction rub.   No murmur heard. Pulmonary/Chest: Effort normal and breath sounds normal. No stridor. No respiratory distress. He has no wheezes. He has no rales. He exhibits no tenderness.  Abdominal: Soft. Bowel sounds are normal. He exhibits no distension and no mass. There is no tenderness. There is no rebound and no guarding.  Genitourinary: Rectum normal and prostate normal. Guaiac negative stool. No penile tenderness.  1+ prostate  Musculoskeletal: Normal range of motion. He exhibits no edema and no tenderness.  Lymphadenopathy:    He has no cervical adenopathy.  Neurological: He is alert and oriented to person, place, and time. He has normal reflexes. No cranial nerve deficit. He exhibits normal muscle tone. Coordination normal.  Skin: Skin is warm and dry. No rash noted. He is not diaphoretic. No erythema. No pallor.  Psychiatric: He has a normal mood and affect. His behavior is normal. Judgment and thought content normal.  AKs on face x1 Lab Results  Component Value Date   WBC 10.1 05/04/2013   HGB 14.8 05/04/2013   HCT 43.5 05/04/2013   PLT 215.0 05/04/2013   GLUCOSE 123* 05/04/2013   CHOL 247* 02/08/2013   TRIG 150.0* 02/08/2013   HDL 31.20* 02/08/2013   LDLDIRECT 198.1 02/08/2013   LDLCALC 92 12/08/2011   ALT 32 05/04/2013   AST 19 05/04/2013   NA 136 05/04/2013   K 4.3 05/04/2013   CL 100  05/04/2013   CREATININE 0.9 05/04/2013   BUN 15 05/04/2013   CO2 29 05/04/2013   TSH 0.77 05/04/2013   PSA 0.80 02/08/2013   HGBA1C 7.8* 10/03/2012    Procedure Note :     Procedure : Cryosurgery   Indication:   Actinic keratosis(es) x1   Risks including unsuccessful procedure , bleeding, infection, bruising,  scar, a need for a repeat  procedure and others were explained to the patient in detail as well as the benefits. Informed consent was obtained verbally.    1 lesion(s)  on  face  was/were treated with liquid nitrogen on a Q-tip in a usual fasion . Band-Aid was applied and antibiotic ointment was given for a later use.   Tolerated well. Complications none.   Postprocedure instructions :     Keep the wounds clean. You can wash them with liquid soap and water. Pat dry with gauze or a Kleenex tissue  Before applying antibiotic ointment and a Band-Aid.   You need to report immediately  if  any signs of infection develop.          Assessment & Plan:

## 2013-08-17 ENCOUNTER — Encounter: Payer: Self-pay | Admitting: Internal Medicine

## 2013-08-17 ENCOUNTER — Ambulatory Visit (INDEPENDENT_AMBULATORY_CARE_PROVIDER_SITE_OTHER): Payer: BC Managed Care – PPO | Admitting: Internal Medicine

## 2013-08-17 VITALS — BP 120/78 | HR 80 | Temp 98.5°F | Resp 16 | Wt 248.0 lb

## 2013-08-17 DIAGNOSIS — D485 Neoplasm of uncertain behavior of skin: Secondary | ICD-10-CM

## 2013-08-17 DIAGNOSIS — D489 Neoplasm of uncertain behavior, unspecified: Secondary | ICD-10-CM

## 2013-08-17 NOTE — Progress Notes (Signed)
   Subjective:    Patient ID: MAXAMILLIAN TIENDA, male    DOB: 12-18-1955, 58 y.o.   MRN: [id number]  HPI  Skin procedure  Review of Systems     Objective:   Physical Exam    Procedure Note :     Procedure :  Skin biopsy   Indication:    Suspicious lesion(s)   Risks including unsuccessful procedure , bleeding, infection, bruising, scar, a need for another complete procedure and others were explained to the patient in detail as well as the benefits. Informed consent was obtained and signed.   The patient was placed in a decubitus position.  Lesion #1 on   R chest  measuring 11x4    mm   Skin over lesion #1  was prepped with Betadine and alcohol  and anesthetized with 0,5 cc of 2% lidocaine and epinephrine, using a 25-gauge 1 inch needle.  Shave biopsy with a sterile Dermablade was carried out in the usual fashion. Hyfrecator was used to destroy the rest of the lesion potentially left behind and for hemostasis. Band-Aid was applied with antibiotic ointment.    Lesion #2 on  R chest above #1   Measuring 11x4   mm   Skin over lesion #2  was prepped with Betadine and alcohol  and anesthetized with 1/2 cc of 2% lidocaine and epinephrine, using a 25-gauge 1 inch needle.  Shave biopsy with a sterile Dermablade was carried out in the usual fashion. Hyfrecator was used to destroy the rest of the lesion potentially left behind and for hemostasis. Band-Aid was applied with antibiotic ointment.  Lesion #3 on L chest   measuring 10x3  mm   Skin over lesion #3  was prepped with Betadine and alcohol  and anesthetized with 1/2 cc of 2% lidocaine and epinephrine, using a 25-gauge 1 inch needle.  Shave biopsy with a sterile Dermablade was carried out in the usual fashion. Hyfrecator was used to destroy the rest of the lesion potentially left behind and for hemostasis. Band-Aid was applied with antibiotic ointment.   Tolerated well. Complications none. #2 and #3 were discarded  Multiple tags were  removed at no charge     Assessment & Plan:

## 2013-08-17 NOTE — Progress Notes (Signed)
Pre visit review using our clinic review tool, if applicable. No additional management support is needed unless otherwise documented below in the visit note. 

## 2013-08-17 NOTE — Patient Instructions (Signed)
Postprocedure instructions :    A Band-Aid should be  changed twice daily. You can take a shower tomorrow.  Keep the wounds clean. You can wash them with liquid soap and water. Pat dry with gauze or a Kleenex tissue  Before applying antibiotic ointment and a Band-Aid.   You need to report immediately  if fever, chills or any signs of infection develop.    The biopsy results should be available in 1 -2 weeks. 

## 2013-08-18 NOTE — Assessment & Plan Note (Signed)
Skin bx 

## 2013-08-22 ENCOUNTER — Other Ambulatory Visit: Payer: Self-pay | Admitting: Internal Medicine

## 2013-10-09 ENCOUNTER — Other Ambulatory Visit: Payer: Self-pay | Admitting: Internal Medicine

## 2013-10-12 ENCOUNTER — Encounter: Payer: Self-pay | Admitting: Cardiology

## 2013-10-12 ENCOUNTER — Ambulatory Visit (HOSPITAL_COMMUNITY): Payer: BC Managed Care – PPO | Attending: Cardiology | Admitting: Cardiology

## 2013-10-12 DIAGNOSIS — I6529 Occlusion and stenosis of unspecified carotid artery: Secondary | ICD-10-CM | POA: Insufficient documentation

## 2013-10-12 NOTE — Progress Notes (Signed)
Carotid duplex completed 

## 2013-10-21 ENCOUNTER — Encounter: Payer: Self-pay | Admitting: Cardiology

## 2013-11-09 ENCOUNTER — Encounter: Payer: Self-pay | Admitting: Cardiology

## 2013-11-09 ENCOUNTER — Ambulatory Visit (INDEPENDENT_AMBULATORY_CARE_PROVIDER_SITE_OTHER): Payer: BC Managed Care – PPO | Admitting: Cardiology

## 2013-11-09 VITALS — BP 139/67 | HR 61 | Ht 73.0 in | Wt 254.0 lb

## 2013-11-09 DIAGNOSIS — I5022 Chronic systolic (congestive) heart failure: Secondary | ICD-10-CM | POA: Insufficient documentation

## 2013-11-09 DIAGNOSIS — I509 Heart failure, unspecified: Secondary | ICD-10-CM

## 2013-11-09 DIAGNOSIS — I255 Ischemic cardiomyopathy: Secondary | ICD-10-CM | POA: Insufficient documentation

## 2013-11-09 DIAGNOSIS — I10 Essential (primary) hypertension: Secondary | ICD-10-CM

## 2013-11-09 DIAGNOSIS — I2510 Atherosclerotic heart disease of native coronary artery without angina pectoris: Secondary | ICD-10-CM

## 2013-11-09 DIAGNOSIS — I739 Peripheral vascular disease, unspecified: Secondary | ICD-10-CM

## 2013-11-09 DIAGNOSIS — I779 Disorder of arteries and arterioles, unspecified: Secondary | ICD-10-CM

## 2013-11-09 DIAGNOSIS — Z951 Presence of aortocoronary bypass graft: Secondary | ICD-10-CM

## 2013-11-09 DIAGNOSIS — R42 Dizziness and giddiness: Secondary | ICD-10-CM

## 2013-11-09 DIAGNOSIS — I2589 Other forms of chronic ischemic heart disease: Secondary | ICD-10-CM

## 2013-11-09 MED ORDER — SPIRONOLACTONE 12.5 MG HALF TABLET: 12.5000 mg | ORAL_TABLET | Freq: Every day | ORAL | Status: DC

## 2013-11-09 NOTE — Assessment & Plan Note (Signed)
The patient underwent bypass surgery in 2003.

## 2013-11-09 NOTE — Assessment & Plan Note (Signed)
He has significant carotid disease. This is followed very carefully. His most recent Doppler was April, 2015. This was stable. 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 His left internal carotid artery is widely patent and is fed from the left external carotid artery. There is 60-79% R. ICA. There is plan for a six-month followup.

## 2013-11-09 NOTE — Assessment & Plan Note (Addendum)
The patient is on a good dose of carvedilol. He is on valsartan 160 mg daily. I have not tried spironolactone previously. His labs have been good with normal renal function and a normal potassium. Will start spironolactone 12.5 mg daily with careful followup of his labs.   It is time now to make a decision about whether he should have an ICD. I will be referring him to Dr. Caryl Comes. I mentioned that his last echo was November, 2014 with an EF of 30-35%. His last nuclear stress study was 2012 with scar but no ischemia. I've outlined his current medications. I spoke with the patient today about the fact that I need expert advice concerning whether he should have an ICD. I also explained to him that he might be an excellent candidate for a subcutaneous ICD. I am referring him for full electrophysiology evaluation. I decided not to push for any other diagnostic studies until I get advice from electrophysiology. Clinically he is quite stable.  As part of today's evaluation I spent greater than 25 minutes with his total care. More than half of this time is been with direct contact having a very complete discussion with him about all of his issues.

## 2013-11-09 NOTE — Assessment & Plan Note (Signed)
He had had some dizzy spells with a brief change in his vision and September, 2014. His event recorder revealed only scattered PVCs. He is not having any significant recurrent symptoms. When he had this problem in the past I was concerned as to whether or not he might be having arrhythmias.

## 2013-11-09 NOTE — Assessment & Plan Note (Signed)
Blood pressure is controlled. No change in therapy. 

## 2013-11-09 NOTE — Assessment & Plan Note (Signed)
Coronary disease is stable. His last nuclear study was July, 2012. 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 There was no ischemia. Based on symptoms I have chosen not to proceed with a repeat nuclear study at this time. I do not know if this will be a consideration when he is seen by electrophysiology in the near future to decide about a possible ICD.

## 2013-11-09 NOTE — Patient Instructions (Signed)
Your physician has recommended you make the following change in your medication: start taking Spironolactone 12.5 mg daily  Your physician recommends that you return for lab work in: 1 week and in 3 weeks  Your physician recommends that you schedule a follow-up appointment in: 3 months  Dr Ron Parker is referring you to Dr Caryl Comes, one of our EP cardiologists, for a new pt consult for consideration of ICD.

## 2013-11-09 NOTE — Progress Notes (Signed)
Patient ID: [name], male   DOB: 03-28-1956, 58 y.o.   MRN: [id number]    HPI  The patient is seen today to followup coronary disease and cardiomyopathy and some prior dizziness. I saw him last October, 2014. He has a reduced ejection fraction. At that time we arranged for a followup echo to reassess his EF. His EF is in the 30-35% range. I have spoken with him in the past about whether or not an ICD would be indicated. I was worried about him in September, 2014 when he had some spells that we have called dizziness. There was no syncope or presyncope. He did wear an event recorder. He had scattered PVCs. There was no ventricular tachycardia. I had decided at that time that I wouldn't have referred him for ICD evaluation then if he had any ventricular tachycardia documented. Since I saw him last I have researched further the new or subcutaneous ICD. I feel that the patient may well be a good candidate for this.  The patient is not having any significant symptoms. He is fully active. He is on a good dose of carvedilol. He is on an ARB. I have reviewed the records to see if I had raised the question of spironolactone previously. I have not. This may be because of his history of no significant symptoms. With current recommendations, I will try to start spironolactone carefully.  The patient's last echo showed an EF of 30-35%. His last nuclear scan in 2012 revealed no significant ischemia.  No Known Allergies  Current Outpatient Prescriptions  Medication Sig Dispense Refill  . aspirin 81 MG EC tablet Take 81 mg by mouth daily.        . carvedilol (COREG) 25 MG tablet TAKE 1 TABLET TWICE A DAY WITH MEALS  180 tablet  3  . Cholecalciferol (VITAMIN D3) 1000 UNITS tablet Take 1,000 Units by mouth daily.        Marland Kitchen ezetimibe-simvastatin (VYTORIN) 10-80 MG per tablet TAKE 1 TABLET BY MOUTH EVERY DAY  90 tablet  3  . Fluticasone Furoate-Vilanterol (BREO ELLIPTA) 100-25 MCG/INH AEPB Inhale 1 Act into the  lungs daily.  1 each  5  . furosemide (LASIX) 40 MG tablet Take 1 tablet (40 mg total) by mouth as needed.  90 tablet  3  . ibuprofen (ADVIL,MOTRIN) 600 MG tablet Take 1 tablet (600 mg total) by mouth as needed.  100 tablet  3  . metFORMIN (GLUCOPHAGE) 500 MG tablet Take 1 tablet (500 mg total) by mouth 2 (two) times daily.  180 tablet  3  . pantoprazole (PROTONIX) 40 MG tablet TAKE 1 TABLET (40 MG TOTAL) BY MOUTH DAILY.  90 tablet  2  . potassium chloride SA (K-DUR,KLOR-CON) 20 MEQ tablet Take 20 mEq by mouth. As needed      . traMADol (ULTRAM) 50 MG tablet Take 1 tablet (50 mg total) by mouth 2 (two) times daily as needed for moderate pain or severe pain.  90 tablet  1  . valsartan (DIOVAN) 160 MG tablet TAKE 1 TABLET BY MOUTH DAILY.  90 tablet  2   No current facility-administered medications for this visit.    History   Social History  . Marital Status: Married    Spouse Name: N/A    Number of Children: N/A  . Years of Education: N/A   Occupational History  . Not on file.   Social History Main Topics  . Smoking status: Never Smoker   . Smokeless tobacco:  Not on file  . Alcohol Use: No  . Drug Use: No  . Sexual Activity: Yes   Other Topics Concern  . Not on file   Social History Narrative   No Regular exercise    Family History  Problem Relation Age of Onset  . Coronary artery disease      Male 1st degree relative <50  . Diabetes      1st degree relative  . Stroke      Male 1st degree relative<50  . Stroke Mother   . Diabetes Father   . COPD Father   . Arthritis Father   . Heart disease Father     Past Medical History  Diagnosis Date  . Hypertension   . Peripheral vascular disease     Dr. Erskin Burnet  . TIA (transient ischemic attack)     by history  . Aortic valve sclerosis     aortic valve sclerosis  . CAD (coronary artery disease)     nuclear 2005  /    . 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 Ejection fraction < 50%     EF 40%, echo 2006, no ICD  needed  / EF 40%, echo, 11/2009  . Hyperlipidemia     Low HDL  . Former cigarette smoker   . Volume overload   . Carotid artery disease     Doppler, July, 2011, stable, chronic total occlusion LCCA, retrograde flow from LECA to LICA, ,, 2-70%JJKK,,, 93-81% RICA  . Sleep disorder     Dr Gwenette Greet, lose weight and consider sleep study later  . Mitral regurgitation     mild, echo, 11/2009  . Dizziness     Past Surgical History  Procedure Laterality Date  . Coronary artery bypass graft    . Colonoscopy  03/19/2009    Patient Active Problem List   Diagnosis Date Noted  . Discoloration of skin 05/04/2013  . Nocturnal leg cramps 05/04/2013  . PVC's (premature ventricular contractions) 03/30/2013  . Dizziness   . CAD (coronary artery disease)   . Diabetes type 2, controlled 02/09/2013  . Cough 06/14/2012  . Neoplasm of uncertain behavior of skin 01/03/2012  . Well adult exam 12/13/2011  . Hypertension   . Peripheral vascular disease   . TIA (transient ischemic attack)   . Aortic valve sclerosis   . Ejection fraction < 50%   . Hyperlipidemia   . Volume overload   . Carotid artery disease   . Sleep disorder   . Mitral regurgitation   . BRONCHITIS 05/15/2010  . KNEE PAIN 05/15/2010  . Actinic keratosis 01/19/2010  . NECK PAIN 01/19/2010  . LEG PAIN 09/30/2009  . SKIN RASH 09/30/2009  . HYPERGLYCEMIA 07/11/2009  . SNORING 01/15/2009  . ABNORMAL LABS 01/15/2009  . OTITIS MEDIA, SEROUS 05/01/2008  . 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    ROS   Patient denies fever, chills, headache, sweats, rash, change in vision, change in hearing, chest pain, cough, nausea vomiting, urinary symptoms. All other systems are reviewed and are negative.  PHYSICAL EXAM  Patient is overweight. He is oriented to person time and place. Affect is normal. He looks quite good. Head is atraumatic. Sclera and conjunctiva are normal. There is no jugulovenous distention. Lungs are clear. Respiratory effort is nonlabored.  Cardiac exam reveals S1 and S2. There are no clicks or significant murmurs. The abdomen is soft. There is no peripheral edema. There no musculoskeletal deformities. There are no skin rashes.  Filed Vitals:   11/09/13 0827  BP: 139/67  Pulse: 61  Height: 6\' 1"  (1.854 m)  Weight: 254 lb (115.214 kg)     ASSESSMENT & PLAN

## 2013-11-09 NOTE — Assessment & Plan Note (Signed)
His volume status has remained quite stable and he has no significant symptoms.

## 2013-11-16 ENCOUNTER — Other Ambulatory Visit (INDEPENDENT_AMBULATORY_CARE_PROVIDER_SITE_OTHER): Payer: BC Managed Care – PPO

## 2013-11-16 DIAGNOSIS — I10 Essential (primary) hypertension: Secondary | ICD-10-CM

## 2013-11-16 LAB — BASIC METABOLIC PANEL
BUN: 14 mg/dL (ref 6–23)
CHLORIDE: 101 meq/L (ref 96–112)
CO2: 26 meq/L (ref 19–32)
Calcium: 9.4 mg/dL (ref 8.4–10.5)
Creatinine, Ser: 0.9 mg/dL (ref 0.4–1.5)
GFR: 95.38 mL/min (ref >60.00)
Glucose, Bld: 111 mg/dL — ABNORMAL HIGH (ref 70–99)
POTASSIUM: 4 meq/L (ref 3.5–5.1)
SODIUM: 136 meq/L (ref 135–145)

## 2013-11-22 ENCOUNTER — Other Ambulatory Visit: Payer: Self-pay | Admitting: Internal Medicine

## 2013-11-28 ENCOUNTER — Other Ambulatory Visit (INDEPENDENT_AMBULATORY_CARE_PROVIDER_SITE_OTHER): Payer: BC Managed Care – PPO

## 2013-11-28 ENCOUNTER — Encounter: Payer: Self-pay | Admitting: Internal Medicine

## 2013-11-28 ENCOUNTER — Ambulatory Visit (INDEPENDENT_AMBULATORY_CARE_PROVIDER_SITE_OTHER): Payer: BC Managed Care – PPO | Admitting: Internal Medicine

## 2013-11-28 VITALS — BP 129/80 | HR 76 | Ht 72.0 in | Wt 254.0 lb

## 2013-11-28 DIAGNOSIS — I739 Peripheral vascular disease, unspecified: Secondary | ICD-10-CM

## 2013-11-28 DIAGNOSIS — I2589 Other forms of chronic ischemic heart disease: Secondary | ICD-10-CM | POA: Diagnosis not present

## 2013-11-28 DIAGNOSIS — G459 Transient cerebral ischemic attack, unspecified: Secondary | ICD-10-CM

## 2013-11-28 DIAGNOSIS — I509 Heart failure, unspecified: Secondary | ICD-10-CM

## 2013-11-28 DIAGNOSIS — E119 Type 2 diabetes mellitus without complications: Secondary | ICD-10-CM

## 2013-11-28 DIAGNOSIS — I5022 Chronic systolic (congestive) heart failure: Secondary | ICD-10-CM

## 2013-11-28 DIAGNOSIS — I255 Ischemic cardiomyopathy: Secondary | ICD-10-CM

## 2013-11-28 DIAGNOSIS — I2510 Atherosclerotic heart disease of native coronary artery without angina pectoris: Secondary | ICD-10-CM

## 2013-11-28 DIAGNOSIS — I10 Essential (primary) hypertension: Secondary | ICD-10-CM

## 2013-11-28 DIAGNOSIS — E785 Hyperlipidemia, unspecified: Secondary | ICD-10-CM

## 2013-11-28 LAB — BASIC METABOLIC PANEL
BUN: 15 mg/dL (ref 6–23)
CALCIUM: 9.5 mg/dL (ref 8.4–10.5)
CO2: 28 meq/L (ref 19–32)
Chloride: 101 mEq/L (ref 96–112)
Creatinine, Ser: 1 mg/dL (ref 0.4–1.5)
GFR: 84.11 mL/min (ref >60.00)
Glucose, Bld: 164 mg/dL — ABNORMAL HIGH (ref 70–99)
Potassium: 4 mEq/L (ref 3.5–5.1)
SODIUM: 136 meq/L (ref 135–145)

## 2013-11-28 LAB — HEMOGLOBIN A1C: Hgb A1c MFr Bld: 7.9 % — ABNORMAL HIGH (ref 4.6–6.5)

## 2013-11-28 NOTE — Progress Notes (Signed)
ELECTROPHYSIOLOGY CONSULT NOTE  Patient ID: [name], MRN: [id number], DOB/AGE: [birth year] 59 y.o. Admit date: (Not on file) Date of Consult: 11/28/2013  Primary Physician: [name], MD Primary Cardiologist: JK  Chief Complaint: ICD   HPI [name] is a 58 y.o. male  Referred for consideration of an ICD. He has a history of ischemic heart disease and prior bypass surgery in 2003. Echocardiogram 11/14 demonstrated ejection fraction of 30-35% with posterior wall akinesis. A Myoview scan 2012 demonstrated a large inferior lateral MI with an ejection fraction of 35%.  He has mild shortness of breath with exertion i.e. climbing stairs. He has occasional peripheral edema. He does not have nocturnal dyspnea or orthopnea. He has not had palpitations. He has had some lightheadedness with standing; this prompted a event recorder. This demonstrated PVCs. They were not quantitated.   He has known vascular disease.    Past Medical History  Diagnosis Date  . Hypertension   . Peripheral vascular disease     Dr. Erskin Burnet  . TIA (transient ischemic attack)     by history  . Aortic valve sclerosis     aortic valve sclerosis  . CAD (coronary artery disease)     nuclear 2005  /    . 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 Ejection fraction < 50%     EF 40%, echo 2006, no ICD needed  / EF 40%, echo, 11/2009  . Hyperlipidemia     Low HDL  . Former cigarette smoker   . Volume overload   . Carotid artery disease     Doppler, July, 2011, stable, chronic total occlusion LCCA, retrograde flow from LECA to LICA, ,, 4-01%UUVO,,, 53-66% RICA  . Sleep disorder     Dr Gwenette Greet, lose weight and consider sleep study later  . Mitral regurgitation     mild, echo, 11/2009  . Dizziness       Surgical History:  Past Surgical History  Procedure Laterality Date  . Coronary artery bypass graft    . Colonoscopy  03/19/2009     Home Meds: Prior to Admission medications   Medication Sig Start  Date End Date Taking? Authorizing Provider  aspirin 81 MG EC tablet Take 81 mg by mouth daily.     Yes Historical Provider, MD  carvedilol (COREG) 25 MG tablet TAKE 1 TABLET TWICE A DAY WITH MEALS 08/13/13  Yes Aleksei Plotnikov V, MD  Cholecalciferol (VITAMIN D3) 1000 UNITS tablet Take 1,000 Units by mouth daily.     Yes Historical Provider, MD  ezetimibe-simvastatin (VYTORIN) 10-80 MG per tablet TAKE 1 TABLET BY MOUTH EVERY DAY 08/13/13  Yes Aleksei Plotnikov V, MD  Fluticasone Furoate-Vilanterol (BREO ELLIPTA) 100-25 MCG/INH AEPB Inhale 1 Act into the lungs daily. 06/14/12  Yes Aleksei Plotnikov V, MD  furosemide (LASIX) 40 MG tablet Take 1 tablet (40 mg total) by mouth as needed. 08/13/13  Yes Aleksei Plotnikov V, MD  ibuprofen (ADVIL,MOTRIN) 600 MG tablet Take 1 tablet (600 mg total) by mouth as needed. 12/13/11  Yes Aleksei Plotnikov V, MD  JANUMET 50-500 MG per tablet  08/27/13  Yes Historical Provider, MD  metFORMIN (GLUCOPHAGE) 500 MG tablet Take 1 tablet (500 mg total) by mouth 2 (two) times daily. 08/13/13  Yes Aleksei Plotnikov V, MD  pantoprazole (PROTONIX) 40 MG tablet TAKE 1 TABLET (40 MG TOTAL) BY MOUTH DAILY.   Yes Lew Dawes V, MD  potassium chloride SA (K-DUR,KLOR-CON) 20 MEQ  tablet Take 20 mEq by mouth. As needed 08/13/13  Yes Aleksei Plotnikov V, MD  spironolactone (ALDACTONE) 12.5 mg TABS tablet Take 0.5 tablets (12.5 mg total) by mouth daily. 11/09/13  Yes Carlena Bjornstad, MD  traMADol (ULTRAM) 50 MG tablet Take 1 tablet (50 mg total) by mouth 2 (two) times daily as needed for moderate pain or severe pain. 08/13/13  Yes Aleksei Plotnikov V, MD  valsartan (DIOVAN) 160 MG tablet TAKE 1 TABLET BY MOUTH DAILY.   Yes Aleksei Plotnikov V, MD  VYTORIN 10-80 MG per tablet TAKE 1 TABLET BY MOUTH EVERY DAY 11/22/13  Yes Aleksei Plotnikov V, MD      Allergies: No Known Allergies  History   Social History  . Marital Status: Married    Spouse Name: N/A    Number of Children: N/A  . Years  of Education: N/A   Occupational History  . Not on file.   Social History Main Topics  . Smoking status: Never Smoker   . Smokeless tobacco: Not on file  . Alcohol Use: No  . Drug Use: No  . Sexual Activity: Yes   Other Topics Concern  . Not on file   Social History Narrative   No Regular exercise     Family History  Problem Relation Age of Onset  . Coronary artery disease      Male 1st degree relative <50  . Diabetes      1st degree relative  . Stroke      Male 1st degree relative<50  . Stroke Mother   . Diabetes Father   . COPD Father   . Arthritis Father   . Heart disease Father      ROS:  Please see the history of present illness.     All other systems reviewed and negative.    Physical Exam:   Blood pressure 129/80, pulse 76, height 6' (1.829 m), weight 254 lb (115.214 kg). General: Well developed, well nourished male in no acute distress. Head: Normocephalic, atraumatic, sclera non-icteric, no xanthomas, nares are without discharge. EENT: normal Lymph Nodes:  none Back: without scoliosis/kyphosis , no CVA tendersness Neck: Right carotid bruit  JVD not elevated. Lungs: Clear bilaterally to auscultation without wheezes, rales, or rhonchi. Breathing is unlabored. Heart: Irregular rhythm with S1 S2. No** murmur , rubs, or gallops appreciated. Abdomen: Soft, non-tender, non-distended with normoactive bowel sounds. No hepatomegaly. No rebound/guarding. No obvious abdominal masses. Msk:  Strength and tone appear normal for age. Extremities: No clubbing or cyanosis. No edema.  Distal pedal pulses are 2+ and equal bilaterally. Skin: Warm and Dry Neuro: Alert and oriented X 3. CN III-XII intact Grossly normal sensory and motor function . Psych:  Responds to questions appropriately with a normal affect.      Labs: Cardiac Enzymes No results found for this basename: CKTOTAL, CKMB, TROPONINI,  in the last 72 hours CBC Lab Results  Component Value Date   WBC 10.1  05/04/2013   HGB 14.8 05/04/2013   HCT 43.5 05/04/2013   MCV 93.4 05/04/2013   PLT 215.0 05/04/2013   PROTIME: No results found for this basename: LABPROT, INR,  in the last 72 hours Chemistry No results found for this basename: NA, K, CL, CO2, BUN, CREATININE, CALCIUM, LABALBU, PROT, BILITOT, ALKPHOS, ALT, AST, GLUCOSE,  in the last 168 hours Lipids Lab Results  Component Value Date   CHOL 128 08/13/2013   HDL 36.90* 08/13/2013   LDLCALC 69 08/13/2013   TRIG 111.0 08/13/2013  BNP No results found for this basename: probnp   Miscellaneous No results found for this basename: DDIMER    Radiology/Studies:  No results found.  EKG:  Sinus rhythm at 76 Interval 17/12/39 Inferior wall MI   Assessment and Plan:   Ischemic cardiomyopathy  Congestive heart failure-chronic-systolic grade 2  Peripheral vascular disease with an occluded left common carotid and right    PVCs   Obesity  Diabetes  The patient has significant left ventricular dysfunction that has been persistent for many years. He also has ventricular ectopy prompts the question is whether the PVCs are primary or secondary. We'll undertake an echocardiogram to reassess left ventricular function is in 8 months. We'll also undertake a 48-hour Holter monitor to quantitate his PVC burden. In the event that they comprise greater than 10-15%, medicinal therapy or ablative therapy targeting the PVCs had seen the impact of left ventricular dysfunction would be appropriate prior to undertaking an ICD. There are data to suggest that MRI can help stratify the likelihood of improvement  I reviewed the above with the patient.  Deboraha Sprang

## 2013-11-28 NOTE — Patient Instructions (Signed)
Your physician has requested that you have an echocardiogram. Echocardiography is a painless test that uses sound waves to create images of your heart. It provides your doctor with information about the size and shape of your heart and how well your heart's chambers and valves are working. This procedure takes approximately one hour. There are no restrictions for this procedure.   Your physician has recommended that you wear a holter monitor(48 hour). Holter monitors are medical devices that record the heart's electrical activity. Doctors most often use these monitors to diagnose arrhythmias. Arrhythmias are problems with the speed or rhythm of the heartbeat. The monitor is a small, portable device. You can wear one while you do your normal daily activities. This is usually used to diagnose what is causing palpitations/syncope (passing out).  Your physician recommends that you schedule a follow-up appointment in: 4 weeks with Dr. Caryl Comes

## 2013-11-30 ENCOUNTER — Other Ambulatory Visit: Payer: BC Managed Care – PPO

## 2013-12-13 ENCOUNTER — Other Ambulatory Visit: Payer: Self-pay | Admitting: *Deleted

## 2013-12-13 DIAGNOSIS — I493 Ventricular premature depolarization: Secondary | ICD-10-CM

## 2013-12-14 ENCOUNTER — Encounter (INDEPENDENT_AMBULATORY_CARE_PROVIDER_SITE_OTHER): Payer: BC Managed Care – PPO

## 2013-12-14 ENCOUNTER — Encounter: Payer: Self-pay | Admitting: Radiology

## 2013-12-14 DIAGNOSIS — I4949 Other premature depolarization: Secondary | ICD-10-CM

## 2013-12-14 DIAGNOSIS — I493 Ventricular premature depolarization: Secondary | ICD-10-CM

## 2013-12-14 NOTE — Progress Notes (Signed)
Patient ID: [name], male   DOB: 1955-09-15, 58 y.o.   MRN: [id number] E cardio 48hr holter applied

## 2013-12-17 ENCOUNTER — Ambulatory Visit (HOSPITAL_COMMUNITY): Payer: BC Managed Care – PPO | Attending: Internal Medicine | Admitting: Radiology

## 2013-12-17 ENCOUNTER — Other Ambulatory Visit (HOSPITAL_COMMUNITY): Payer: BC Managed Care – PPO | Admitting: Cardiology

## 2013-12-17 DIAGNOSIS — I255 Ischemic cardiomyopathy: Secondary | ICD-10-CM

## 2013-12-17 DIAGNOSIS — I2589 Other forms of chronic ischemic heart disease: Secondary | ICD-10-CM

## 2013-12-17 DIAGNOSIS — I5022 Chronic systolic (congestive) heart failure: Secondary | ICD-10-CM | POA: Insufficient documentation

## 2013-12-17 DIAGNOSIS — I2510 Atherosclerotic heart disease of native coronary artery without angina pectoris: Secondary | ICD-10-CM

## 2013-12-17 DIAGNOSIS — I509 Heart failure, unspecified: Secondary | ICD-10-CM

## 2013-12-17 MED ORDER — PERFLUTREN PROTEIN A MICROSPH IV SUSP
3.0000 mL | Freq: Once | INTRAVENOUS | Status: AC
  Administered 2013-12-17: 3 mL via INTRAVENOUS

## 2013-12-17 NOTE — Progress Notes (Signed)
Echocardiogram performed with optison.  

## 2014-01-10 ENCOUNTER — Encounter: Payer: Self-pay | Admitting: Internal Medicine

## 2014-01-10 ENCOUNTER — Ambulatory Visit (INDEPENDENT_AMBULATORY_CARE_PROVIDER_SITE_OTHER): Payer: BC Managed Care – PPO | Admitting: Internal Medicine

## 2014-01-10 VITALS — BP 121/72 | HR 73 | Ht 72.0 in | Wt 252.0 lb

## 2014-01-10 DIAGNOSIS — I509 Heart failure, unspecified: Secondary | ICD-10-CM

## 2014-01-10 DIAGNOSIS — I5022 Chronic systolic (congestive) heart failure: Secondary | ICD-10-CM

## 2014-01-10 DIAGNOSIS — I4949 Other premature depolarization: Secondary | ICD-10-CM

## 2014-01-10 DIAGNOSIS — I2589 Other forms of chronic ischemic heart disease: Secondary | ICD-10-CM

## 2014-01-10 DIAGNOSIS — I255 Ischemic cardiomyopathy: Secondary | ICD-10-CM

## 2014-01-10 DIAGNOSIS — I493 Ventricular premature depolarization: Secondary | ICD-10-CM

## 2014-01-10 NOTE — Patient Instructions (Addendum)
Your physician recommends that you continue on your current medications as directed. Please refer to the Current Medication list given to you today.  Your physician has recommended that you have a subcutaneous defibrillator inserted. An implantable cardioverter defibrillator (ICD) is a small device that is placed in your chest or, in rare cases, your abdomen. This device uses electrical pulses or shocks to help control life-threatening, irregular heartbeats that could lead the heart to suddenly stop beating (sudden cardiac arrest). Leads are attached to the ICD that goes into your heart. This is done in the hospital and usually requires an overnight stay. Please see the instruction sheet given to you today for more information.  I will call you to schedule this once we have obtained approval through insurance.

## 2014-01-10 NOTE — Progress Notes (Signed)
Patient Care Team: Cassandria Anger, MD as PCP - General Carlena Bjornstad, MD (Cardiology) Milus Banister, MD (Gastroenterology)   HPI  [name] is a 58 y.o. male Following initial consultation a month ago for consideration of ICD implantation. He was noted at that time to have frequent ventricular ectopy. Holter monitor demonstrated however only 3% PVCs. He has been on guideline directed medical therapy. Repeat echocardiogram 6/15 demonstrated an ejection fraction of 30-35%. His ischemic heart disease with prior bypass surgery.  Past Medical History  Diagnosis Date  . Hypertension   . Peripheral vascular disease     Dr. Erskin Burnet  . TIA (transient ischemic attack)     by history  . Aortic valve sclerosis     aortic valve sclerosis  . CAD (coronary artery disease)     nuclear 2005  /    . 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 Ejection fraction < 50%     EF 40%, echo 2006, no ICD needed  / EF 40%, echo, 11/2009  . Hyperlipidemia     Low HDL  . Former cigarette smoker   . Volume overload   . Carotid artery disease     Doppler, July, 2011, stable, chronic total occlusion LCCA, retrograde flow from LECA to LICA, ,, 4-54%UJWJ,,, 19-14% RICA  . Sleep disorder     Dr Gwenette Greet, lose weight and consider sleep study later  . Mitral regurgitation     mild, echo, 11/2009  . Dizziness     Past Surgical History  Procedure Laterality Date  . Coronary artery bypass graft    . Colonoscopy  03/19/2009    Current Outpatient Prescriptions  Medication Sig Dispense Refill  . aspirin 81 MG EC tablet Take 81 mg by mouth daily.        . carvedilol (COREG) 25 MG tablet TAKE 1 TABLET TWICE A DAY WITH MEALS  180 tablet  3  . Cholecalciferol (VITAMIN D3) 1000 UNITS tablet Take 1,000 Units by mouth daily.        Marland Kitchen ezetimibe-simvastatin (VYTORIN) 10-80 MG per tablet TAKE 1 TABLET BY MOUTH EVERY DAY  90 tablet  3  . Fluticasone Furoate-Vilanterol (BREO ELLIPTA) 100-25 MCG/INH AEPB  Inhale 1 Act into the lungs daily.  1 each  5  . furosemide (LASIX) 40 MG tablet Take 1 tablet (40 mg total) by mouth as needed.  90 tablet  3  . ibuprofen (ADVIL,MOTRIN) 600 MG tablet Take 1 tablet (600 mg total) by mouth as needed.  100 tablet  3  . JANUMET 50-500 MG per tablet       . pantoprazole (PROTONIX) 40 MG tablet TAKE 1 TABLET (40 MG TOTAL) BY MOUTH DAILY.  90 tablet  2  . potassium chloride SA (K-DUR,KLOR-CON) 20 MEQ tablet Take 20 mEq by mouth. As needed      . spironolactone (ALDACTONE) 12.5 mg TABS tablet Take 0.5 tablets (12.5 mg total) by mouth daily.  15 tablet  3  . valsartan (DIOVAN) 160 MG tablet TAKE 1 TABLET BY MOUTH DAILY.  90 tablet  2   No current facility-administered medications for this visit.    No Known Allergies  Review of Systems negative except from HPI and PMH  Physical Exam BP 121/72  Pulse 73  Ht 6' (1.829 m)  Wt 252 lb (114.306 kg)  BMI 34.17 kg/m2 Well developed and nourished in no acute distress HENT normal Neck supple with  JVP-flat Clear Regular rate and rhythm, no murmurs or gallops Abd-soft with active BS No Clubbing cyanosis edema Skin-warm and dry A & Oriented  Grossly normal sensory and motor function     Assessment and  Plan  Ischemic cardiomyopathy  Congestive heart failure class IIa  Hypertension  We had a lengthy discussion regarding ICD implantation and potential to reduce the risk of sudden cardiac death. He meets class I criteria depressed left ventricular function  He would like to be considered for subcutaneous ICD so we'll have him that.  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

## 2014-01-11 ENCOUNTER — Other Ambulatory Visit: Payer: Self-pay | Admitting: Internal Medicine

## 2014-02-01 ENCOUNTER — Encounter: Payer: Self-pay | Admitting: Gastroenterology

## 2014-02-07 ENCOUNTER — Telehealth: Payer: Self-pay | Admitting: *Deleted

## 2014-02-07 NOTE — Telephone Encounter (Signed)
Left message. Need to schedule traditional ICD implant. Need to inform pt he was denied or S-ICD. Informed him that I would be out of the office until 8/18 and would call him when I return.

## 2014-02-11 ENCOUNTER — Encounter: Payer: Self-pay | Admitting: Internal Medicine

## 2014-02-11 ENCOUNTER — Ambulatory Visit (INDEPENDENT_AMBULATORY_CARE_PROVIDER_SITE_OTHER): Payer: BC Managed Care – PPO | Admitting: Internal Medicine

## 2014-02-11 VITALS — BP 106/91 | HR 73 | Temp 98.1°F | Wt 252.0 lb

## 2014-02-11 DIAGNOSIS — R05 Cough: Secondary | ICD-10-CM

## 2014-02-11 DIAGNOSIS — G459 Transient cerebral ischemic attack, unspecified: Secondary | ICD-10-CM

## 2014-02-11 DIAGNOSIS — I2510 Atherosclerotic heart disease of native coronary artery without angina pectoris: Secondary | ICD-10-CM

## 2014-02-11 DIAGNOSIS — E119 Type 2 diabetes mellitus without complications: Secondary | ICD-10-CM

## 2014-02-11 DIAGNOSIS — R059 Cough, unspecified: Secondary | ICD-10-CM

## 2014-02-11 DIAGNOSIS — I10 Essential (primary) hypertension: Secondary | ICD-10-CM

## 2014-02-11 MED ORDER — LORATADINE 10 MG PO TABS: 10.0000 mg | ORAL_TABLET | Freq: Every day | ORAL | Status: DC

## 2014-02-11 MED ORDER — FLUTICASONE FUROATE-VILANTEROL 100-25 MCG/INH IN AEPB: 1.0000 | INHALATION_SPRAY | Freq: Every day | RESPIRATORY_TRACT | Status: DC

## 2014-02-11 NOTE — Assessment & Plan Note (Signed)
2015: discussion regarding ICD implantation and potential to reduce the risk of sudden cardiac death. He meets class I criteria depressed left ventricular function Continue with current prescription therapy as reflected on the Med list.

## 2014-02-11 NOTE — Assessment & Plan Note (Signed)
Continue with current prescription therapy as reflected on the Med list. Labs  

## 2014-02-11 NOTE — Assessment & Plan Note (Signed)
Poss allergy Start Loratidine

## 2014-02-11 NOTE — Progress Notes (Signed)
Subjective:   HPI    The patient needs to address  chronic hypertension that has been well controlled with medicines; to address chronic  hyperlipidemia controlled with medicines as well; and to address CAD, type 2 pre diabetes, controlled with medical treatment and diet.   BP Readings from Last 3 Encounters:  02/11/14 106/91  01/10/14 121/72  11/28/13 129/80   Wt Readings from Last 3 Encounters:  02/11/14 252 lb (114.306 kg)  01/10/14 252 lb (114.306 kg)  11/28/13 254 lb (115.214 kg)      Review of Systems  Constitutional: Negative for chills, appetite change, fatigue and unexpected weight change.  HENT: Negative for congestion, nosebleeds, sneezing, sore throat and trouble swallowing.   Eyes: Negative for itching and visual disturbance.  Respiratory: Negative for cough.   Cardiovascular: Negative for chest pain, palpitations and leg swelling.  Gastrointestinal: Negative for nausea, diarrhea, blood in stool and abdominal distention.  Genitourinary: Negative for frequency and hematuria.  Musculoskeletal: Positive for arthralgias. Negative for back pain, gait problem, joint swelling and neck pain.  Skin: Negative for rash.  Neurological: Negative for dizziness, tremors, speech difficulty and weakness.  Psychiatric/Behavioral: Negative for sleep disturbance, dysphoric mood and agitation. The patient is not nervous/anxious.        Objective:   Physical Exam  Constitutional: He is oriented to person, place, and time. He appears well-developed and well-nourished. No distress.  HENT:  Head: Normocephalic and atraumatic.  Right Ear: External ear normal.  Left Ear: External ear normal.  Nose: Nose normal.  Mouth/Throat: Oropharynx is clear and moist. No oropharyngeal exudate.  Eyes: Conjunctivae and EOM are normal. Pupils are equal, round, and reactive to light. Right eye exhibits no discharge. Left eye exhibits no discharge. No scleral icterus.  Neck: Normal range of  motion. Neck supple. No JVD present. No tracheal deviation present. No thyromegaly present.  Cardiovascular: Normal rate, regular rhythm, normal heart sounds and intact distal pulses.  Exam reveals no gallop and no friction rub.   No murmur heard. Pulmonary/Chest: Effort normal and breath sounds normal. No stridor. No respiratory distress. He has no wheezes. He has no rales. He exhibits no tenderness.  Abdominal: Soft. Bowel sounds are normal. He exhibits no distension and no mass. There is no tenderness. There is no rebound and no guarding.  Genitourinary: Rectum normal and prostate normal. Guaiac negative stool. No penile tenderness.  1+ prostate  Musculoskeletal: Normal range of motion. He exhibits no edema and no tenderness.  Lymphadenopathy:    He has no cervical adenopathy.  Neurological: He is alert and oriented to person, place, and time. He has normal reflexes. No cranial nerve deficit. He exhibits normal muscle tone. Coordination normal.  Skin: Skin is warm and dry. No rash noted. He is not diaphoretic. No erythema. No pallor.  Psychiatric: He has a normal mood and affect. His behavior is normal. Judgment and thought content normal.   Lab Results  Component Value Date   WBC 10.1 05/04/2013   HGB 14.8 05/04/2013   HCT 43.5 05/04/2013   PLT 215.0 05/04/2013   GLUCOSE 164* 11/28/2013   CHOL 128 08/13/2013   TRIG 111.0 08/13/2013   HDL 36.90* 08/13/2013   LDLDIRECT 198.1 02/08/2013   LDLCALC 69 08/13/2013   ALT 29 08/13/2013   AST 19 08/13/2013   NA 136 11/28/2013   K 4.0 11/28/2013   CL 101 11/28/2013   CREATININE 1.0 11/28/2013   BUN 15 11/28/2013   CO2 28 11/28/2013   TSH  0.77 05/04/2013   PSA 0.80 02/08/2013   HGBA1C 7.9* 11/28/2013        Assessment & Plan:

## 2014-02-11 NOTE — Progress Notes (Deleted)
Pre visit review using our clinic review tool, if applicable. No additional management support is needed unless otherwise documented below in the visit note. 

## 2014-02-11 NOTE — Assessment & Plan Note (Signed)
No relapse 

## 2014-02-11 NOTE — Assessment & Plan Note (Signed)
Continue with current prescription therapy as reflected on the Med list.  

## 2014-02-12 ENCOUNTER — Telehealth: Payer: Self-pay | Admitting: Internal Medicine

## 2014-02-12 NOTE — Telephone Encounter (Signed)
Relevant patient education assigned to patient using Emmi. ° °

## 2014-02-20 ENCOUNTER — Encounter: Payer: Self-pay | Admitting: Cardiology

## 2014-02-21 ENCOUNTER — Encounter: Payer: Self-pay | Admitting: Cardiology

## 2014-02-21 ENCOUNTER — Ambulatory Visit (INDEPENDENT_AMBULATORY_CARE_PROVIDER_SITE_OTHER): Payer: BC Managed Care – PPO | Admitting: Cardiology

## 2014-02-21 VITALS — BP 108/64 | HR 66 | Ht 72.0 in | Wt 252.4 lb

## 2014-02-21 DIAGNOSIS — I255 Ischemic cardiomyopathy: Secondary | ICD-10-CM

## 2014-02-21 DIAGNOSIS — I5022 Chronic systolic (congestive) heart failure: Secondary | ICD-10-CM

## 2014-02-21 DIAGNOSIS — I25811 Atherosclerosis of native coronary artery of transplanted heart without angina pectoris: Secondary | ICD-10-CM

## 2014-02-21 DIAGNOSIS — I2589 Other forms of chronic ischemic heart disease: Secondary | ICD-10-CM

## 2014-02-21 DIAGNOSIS — I509 Heart failure, unspecified: Secondary | ICD-10-CM

## 2014-02-21 NOTE — Assessment & Plan Note (Signed)
Volume status is stable. No change in therapy. 

## 2014-02-21 NOTE — Assessment & Plan Note (Signed)
Coronary disease is stable. No change in therapy. 

## 2014-02-21 NOTE — Patient Instructions (Signed)
Your physician recommends that you continue on your current medications as directed. Please refer to the Current Medication list given to you today.  Your physician wants you to follow-up in: 6 months. You will receive a reminder letter in the mail two months in advance. If you don't receive a letter, please call our office to schedule the follow-up appointment.  

## 2014-02-21 NOTE — Progress Notes (Signed)
Patient ID: [name], male   DOB: 06/30/1956, 58 y.o.   MRN: [id number]    HPI  Patient is seen for overall followup of his coronary disease and cardiomyopathy. He is in very careful review by Dr. Caryl Martinez. We'll request her in to try to obtain subcutaneous defibrillator for him. At this point there will need to be further communication between Dr. Caryl Martinez and the insurance company. At the same time, the patient continues to think to all aspects of having a defibrillator including [name] may affect his work. He also questions whether he will be allowed to ride his motorcycle. Of course this is not the key issue for him. He's not having any chest pain or shortness of breath. He works regularly with difficult work and feels fine. He has had a cough recently. This may be related allergies. He is not on an ACE inhibitor. He is on an ARB.  No Known Allergies  Current Outpatient Prescriptions  Medication Sig Dispense Refill  . aspirin 81 MG EC tablet Take 81 mg by mouth daily.        . carvedilol (COREG) 25 MG tablet TAKE 1 TABLET TWICE A DAY WITH MEALS  180 tablet  3  . Cholecalciferol (VITAMIN D3) 1000 UNITS tablet Take 1,000 Units by mouth daily.        Marland Kitchen ezetimibe-simvastatin (VYTORIN) 10-80 MG per tablet TAKE 1 TABLET BY MOUTH EVERY DAY  90 tablet  3  . ibuprofen (ADVIL,MOTRIN) 600 MG tablet Take 1 tablet (600 mg total) by mouth as needed.  100 tablet  3  . JANUMET 50-500 MG per tablet TAKE 1 TABLET TWICE A DAY WITH MEALS  60 tablet  5  . loratadine (CLARITIN) 10 MG tablet Take 1 tablet (10 mg total) by mouth daily.  100 tablet  3  . pantoprazole (PROTONIX) 40 MG tablet TAKE 1 TABLET (40 MG TOTAL) BY MOUTH DAILY.  90 tablet  2  . spironolactone (ALDACTONE) 12.5 mg TABS tablet Take 0.5 tablets (12.5 mg total) by mouth daily.  15 tablet  3  . valsartan (DIOVAN) 160 MG tablet TAKE 1 TABLET BY MOUTH DAILY.  90 tablet  2   No current facility-administered medications for this visit.    History    Social History  . Marital Status: Married    Spouse Name: N/A    Number of Children: N/A  . Years of Education: N/A   Occupational History  . Not on file.   Social History Main Topics  . Smoking status: Never Smoker   . Smokeless tobacco: Not on file  . Alcohol Use: No  . Drug Use: No  . Sexual Activity: Yes   Other Topics Concern  . Not on file   Social History Narrative   No Regular exercise    Family History  Problem Relation Age of Onset  . Coronary artery disease      Male 1st degree relative <50  . Diabetes      1st degree relative  . Stroke      Male 1st degree relative<50  . Stroke Mother   . Diabetes Father   . COPD Father   . Arthritis Father   . Heart disease Father     Past Medical History  Diagnosis Date  . Hypertension   . Peripheral vascular disease     Dr. Erskin Martinez  . TIA (transient ischemic attack)     by history  . Aortic valve sclerosis  aortic valve sclerosis  . CAD (coronary artery disease)     nuclear 2005  /    . 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 Ejection fraction < 50%     EF 40%, echo 2006, no ICD needed  / EF 40%, echo, 11/2009  . Hyperlipidemia     Low HDL  . Former cigarette smoker   . Volume overload   . Carotid artery disease     Doppler, July, 2011, stable, chronic total occlusion LCCA, retrograde flow from LECA to LICA, ,, 6-57%QION,,, 62-95% RICA  . Sleep disorder     Dr [name], lose weight and consider sleep study later  . Mitral regurgitation     mild, echo, 11/2009  . Dizziness     Past Surgical History  Procedure Laterality Date  . Coronary artery bypass graft    . Colonoscopy  03/19/2009    Patient Active Problem List   Diagnosis Date Noted  . Cardiomyopathy, ischemic 11/09/2013  . Chronic systolic CHF (congestive heart failure) 11/09/2013  . Discoloration of skin 05/04/2013  . Nocturnal leg cramps 05/04/2013  . PVC's (premature ventricular contractions) 03/30/2013  . Dizziness   . CAD (coronary  artery disease)   . Diabetes type 2, controlled 02/09/2013  . Cough 06/14/2012  . Neoplasm of uncertain behavior of skin 01/03/2012  . Well adult exam 12/13/2011  . Hypertension   . Peripheral vascular disease   . TIA (transient ischemic attack)   . Aortic valve sclerosis   . Ejection fraction < 50%   . Hyperlipidemia   . Volume overload   . Carotid artery disease   . Sleep disorder   . Mitral regurgitation   . BRONCHITIS 05/15/2010  . KNEE PAIN 05/15/2010  . Actinic keratosis 01/19/2010  . NECK PAIN 01/19/2010  . LEG PAIN 09/30/2009  . SKIN RASH 09/30/2009  . HYPERGLYCEMIA 07/11/2009  . SNORING 01/15/2009  . ABNORMAL LABS 01/15/2009  . OTITIS MEDIA, SEROUS 05/01/2008  . 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    ROS   Patient denies fever, chills, headache, sweats, rash, change in vision, change in hearing, chest pain, cough, nausea vomiting, urinary symptoms. All other systems are reviewed and are negative.  PHYSICAL EXAM  Patient is overweight but stable. Head is atraumatic. Sclera and conjunctiva are normal. There are carotid bruits. Lungs are clear. Respiratory effort is nonlabored. Cardiac exam reveals S1 and S2. There is a soft systolic murmur. Abdomen is soft. There is no peripheral edema. There no musculoskeletal deformities. No skin rashes.  Filed Vitals:   02/21/14 0809  BP: 108/64  Pulse: 66  Height: 6' (1.829 m)  Weight: 252 lb 6.4 oz (114.488 kg)  SpO2: 95%     ASSESSMENT & PLAN

## 2014-02-21 NOTE — Assessment & Plan Note (Signed)
He is on appropriate medications. No change in therapy. There is ongoing consideration concerning placement of an ICD.

## 2014-03-13 ENCOUNTER — Other Ambulatory Visit: Payer: Self-pay | Admitting: Cardiology

## 2014-03-13 ENCOUNTER — Telehealth: Payer: Self-pay | Admitting: Internal Medicine

## 2014-03-13 NOTE — Telephone Encounter (Signed)
New message ° ° ° ° °Returned a nurses call °

## 2014-03-15 NOTE — Telephone Encounter (Signed)
Informed patient that S-ICD denied through insurance company. Next step is the traditional route. He would like to discuss this with Dr. Caryl Comes, and is aware he will be out of the office until 9/22. He is ok to wait, and in no rush about this.

## 2014-03-15 NOTE — Telephone Encounter (Signed)
Follow up  ° ° ° °Returning call back to nurse  °

## 2014-04-11 ENCOUNTER — Other Ambulatory Visit: Payer: Self-pay | Admitting: Internal Medicine

## 2014-04-11 NOTE — Telephone Encounter (Signed)
Patient confirms that he did speak with Dr. Caryl Comes. Pt would like Dr Caryl Comes to speak with [employer] about this, as he is arguing with them now about denial. Explained that I would speak with Dr. Caryl Comes and let him know. He is agreeable to this.

## 2014-04-30 ENCOUNTER — Other Ambulatory Visit (HOSPITAL_COMMUNITY): Payer: Self-pay | Admitting: *Deleted

## 2014-04-30 DIAGNOSIS — I6523 Occlusion and stenosis of bilateral carotid arteries: Secondary | ICD-10-CM

## 2014-05-03 ENCOUNTER — Ambulatory Visit (HOSPITAL_COMMUNITY): Payer: BC Managed Care – PPO | Attending: Cardiology | Admitting: *Deleted

## 2014-05-03 DIAGNOSIS — Z951 Presence of aortocoronary bypass graft: Secondary | ICD-10-CM | POA: Diagnosis not present

## 2014-05-03 DIAGNOSIS — I6523 Occlusion and stenosis of bilateral carotid arteries: Secondary | ICD-10-CM | POA: Insufficient documentation

## 2014-05-03 DIAGNOSIS — Z8673 Personal history of transient ischemic attack (TIA), and cerebral infarction without residual deficits: Secondary | ICD-10-CM | POA: Insufficient documentation

## 2014-05-03 DIAGNOSIS — I739 Peripheral vascular disease, unspecified: Secondary | ICD-10-CM | POA: Diagnosis not present

## 2014-05-03 DIAGNOSIS — Z87891 Personal history of nicotine dependence: Secondary | ICD-10-CM | POA: Diagnosis not present

## 2014-05-03 DIAGNOSIS — E785 Hyperlipidemia, unspecified: Secondary | ICD-10-CM | POA: Insufficient documentation

## 2014-05-03 DIAGNOSIS — I2510 Atherosclerotic heart disease of native coronary artery without angina pectoris: Secondary | ICD-10-CM | POA: Diagnosis present

## 2014-05-03 DIAGNOSIS — I10 Essential (primary) hypertension: Secondary | ICD-10-CM | POA: Diagnosis not present

## 2014-05-03 NOTE — Progress Notes (Signed)
Carotid Duplex Performed 

## 2014-05-15 ENCOUNTER — Ambulatory Visit (INDEPENDENT_AMBULATORY_CARE_PROVIDER_SITE_OTHER): Payer: BC Managed Care – PPO | Admitting: Internal Medicine

## 2014-05-15 ENCOUNTER — Other Ambulatory Visit (INDEPENDENT_AMBULATORY_CARE_PROVIDER_SITE_OTHER): Payer: BC Managed Care – PPO

## 2014-05-15 ENCOUNTER — Encounter: Payer: Self-pay | Admitting: Internal Medicine

## 2014-05-15 VITALS — BP 108/70 | HR 63 | Temp 97.8°F | Wt 248.5 lb

## 2014-05-15 DIAGNOSIS — E119 Type 2 diabetes mellitus without complications: Secondary | ICD-10-CM

## 2014-05-15 DIAGNOSIS — I10 Essential (primary) hypertension: Secondary | ICD-10-CM

## 2014-05-15 DIAGNOSIS — J069 Acute upper respiratory infection, unspecified: Secondary | ICD-10-CM | POA: Insufficient documentation

## 2014-05-15 DIAGNOSIS — E669 Obesity, unspecified: Secondary | ICD-10-CM | POA: Insufficient documentation

## 2014-05-15 DIAGNOSIS — I739 Peripheral vascular disease, unspecified: Secondary | ICD-10-CM

## 2014-05-15 LAB — BASIC METABOLIC PANEL
BUN: 13 mg/dL (ref 6–23)
CALCIUM: 9.4 mg/dL (ref 8.4–10.5)
CO2: 27 mEq/L (ref 19–32)
Chloride: 100 mEq/L (ref 96–112)
Creatinine, Ser: 1 mg/dL (ref 0.4–1.5)
GFR: 84.99 mL/min (ref >60.00)
GLUCOSE: 205 mg/dL — AB (ref 70–99)
POTASSIUM: 4.4 meq/L (ref 3.5–5.1)
Sodium: 133 mEq/L — ABNORMAL LOW (ref 135–145)

## 2014-05-15 LAB — HEMOGLOBIN A1C: HEMOGLOBIN A1C: 7.8 % — AB (ref 4.6–6.5)

## 2014-05-15 NOTE — Progress Notes (Signed)
   Subjective:   HPI   C/o URI sx's x few days  The patient needs to address  chronic hypertension that has been well controlled with medicines; to address chronic  hyperlipidemia controlled with medicines as well; and to address CAD, type 2 pre diabetes, controlled with medical treatment and diet.   BP Readings from Last 3 Encounters:  05/15/14 108/70  02/21/14 108/64  02/11/14 106/91   Wt Readings from Last 3 Encounters:  05/15/14 248 lb 8 oz (112.719 kg)  02/21/14 252 lb 6.4 oz (114.488 kg)  02/11/14 252 lb (114.306 kg)      Review of Systems  Constitutional: Negative for chills, appetite change, fatigue and unexpected weight change.  HENT: Negative for congestion, nosebleeds, sneezing, sore throat and trouble swallowing.   Eyes: Negative for itching and visual disturbance.  Respiratory: Negative for cough.   Cardiovascular: Negative for chest pain, palpitations and leg swelling.  Gastrointestinal: Negative for nausea, diarrhea, blood in stool and abdominal distention.  Genitourinary: Negative for frequency and hematuria.  Musculoskeletal: Positive for arthralgias. Negative for back pain, joint swelling, gait problem and neck pain.  Skin: Negative for rash.  Neurological: Negative for dizziness, tremors, speech difficulty and weakness.  Psychiatric/Behavioral: Negative for sleep disturbance, dysphoric mood and agitation. The patient is not nervous/anxious.        Objective:   Physical Exam  Constitutional: He is oriented to person, place, and time. He appears well-developed. No distress.  Obese NAD  HENT:  Mouth/Throat: Oropharynx is clear and moist.  Eyes: Conjunctivae are normal. Pupils are equal, round, and reactive to light.  Neck: Normal range of motion. No JVD present. No thyromegaly present.  Cardiovascular: Normal rate, regular rhythm, normal heart sounds and intact distal pulses.  Exam reveals no gallop and no friction rub.   No murmur  heard. Pulmonary/Chest: Effort normal and breath sounds normal. No respiratory distress. He has no wheezes. He has no rales. He exhibits no tenderness.  Abdominal: Soft. Bowel sounds are normal. He exhibits no distension and no mass. There is no tenderness. There is no rebound and no guarding.  Musculoskeletal: Normal range of motion. He exhibits no edema or tenderness.  Lymphadenopathy:    He has no cervical adenopathy.  Neurological: He is alert and oriented to person, place, and time. He has normal reflexes. No cranial nerve deficit. He exhibits normal muscle tone. He displays a negative Romberg sign. Coordination and gait normal.  No meningeal signs  Skin: Skin is warm and dry. No rash noted.  Psychiatric: He has a normal mood and affect. His behavior is normal. Judgment and thought content normal.   Lab Results  Component Value Date   WBC 10.1 05/04/2013   HGB 14.8 05/04/2013   HCT 43.5 05/04/2013   PLT 215.0 05/04/2013   GLUCOSE 164* 11/28/2013   CHOL 128 08/13/2013   TRIG 111.0 08/13/2013   HDL 36.90* 08/13/2013   LDLDIRECT 198.1 02/08/2013   LDLCALC 69 08/13/2013   ALT 29 08/13/2013   AST 19 08/13/2013   NA 136 11/28/2013   K 4.0 11/28/2013   CL 101 11/28/2013   CREATININE 1.0 11/28/2013   BUN 15 11/28/2013   CO2 28 11/28/2013   TSH 0.77 05/04/2013   PSA 0.80 02/08/2013   HGBA1C 7.9* 11/28/2013        Assessment & Plan:

## 2014-05-15 NOTE — Assessment & Plan Note (Signed)
Continue with current prescription therapy as reflected on the Med list. Labs  

## 2014-05-15 NOTE — Assessment & Plan Note (Signed)
OTC meds 

## 2014-05-15 NOTE — Assessment & Plan Note (Signed)
Continue with current prescription therapy as reflected on the Med list.  

## 2014-05-15 NOTE — Progress Notes (Signed)
Pre visit review using our clinic review tool, if applicable. No additional management support is needed unless otherwise documented below in the visit note. 

## 2014-05-15 NOTE — Assessment & Plan Note (Signed)
Wt Readings from Last 3 Encounters:  05/15/14 248 lb 8 oz (112.719 kg)  02/21/14 252 lb 6.4 oz (114.488 kg)  02/11/14 252 lb (114.306 kg)

## 2014-06-10 ENCOUNTER — Other Ambulatory Visit: Payer: Self-pay | Admitting: Internal Medicine

## 2014-07-10 ENCOUNTER — Other Ambulatory Visit: Payer: Self-pay | Admitting: Internal Medicine

## 2014-07-11 ENCOUNTER — Other Ambulatory Visit: Payer: Self-pay | Admitting: Internal Medicine

## 2014-08-27 ENCOUNTER — Other Ambulatory Visit: Payer: Self-pay | Admitting: Internal Medicine

## 2014-09-09 ENCOUNTER — Telehealth: Payer: Self-pay | Admitting: Cardiology

## 2014-09-09 NOTE — Telephone Encounter (Signed)
New message     Pt picked up something Friday and thinks he pulled a muscle in his chest.  The center of his chest is sore and he has shoulder pain.  He says it is not his heart. Pt want to know if he should be concerned.  Please advise

## 2014-09-09 NOTE — Telephone Encounter (Signed)
Pt was worried that since he is still sore from over doing it lifting on Friday.  Pt wanted to know if there was anything we could recommend to help.  Pt instructed to rest the muscle as much as possible and contact is PCP.  Pt denies any nausea, vomiting, or shortness of breath. Pt does not feel that it is cardiac related.

## 2014-09-11 ENCOUNTER — Encounter: Payer: Self-pay | Admitting: Gastroenterology

## 2014-09-13 ENCOUNTER — Ambulatory Visit (INDEPENDENT_AMBULATORY_CARE_PROVIDER_SITE_OTHER): Payer: BLUE CROSS/BLUE SHIELD | Admitting: Internal Medicine

## 2014-09-13 ENCOUNTER — Other Ambulatory Visit (INDEPENDENT_AMBULATORY_CARE_PROVIDER_SITE_OTHER): Payer: BLUE CROSS/BLUE SHIELD

## 2014-09-13 ENCOUNTER — Encounter: Payer: Self-pay | Admitting: Internal Medicine

## 2014-09-13 VITALS — BP 100/70 | HR 61 | Temp 98.4°F | Wt 247.0 lb

## 2014-09-13 DIAGNOSIS — I2583 Coronary atherosclerosis due to lipid rich plaque: Principal | ICD-10-CM

## 2014-09-13 DIAGNOSIS — I2510 Atherosclerotic heart disease of native coronary artery without angina pectoris: Secondary | ICD-10-CM

## 2014-09-13 DIAGNOSIS — R0789 Other chest pain: Secondary | ICD-10-CM

## 2014-09-13 DIAGNOSIS — E119 Type 2 diabetes mellitus without complications: Secondary | ICD-10-CM

## 2014-09-13 DIAGNOSIS — I255 Ischemic cardiomyopathy: Secondary | ICD-10-CM

## 2014-09-13 DIAGNOSIS — G458 Other transient cerebral ischemic attacks and related syndromes: Secondary | ICD-10-CM

## 2014-09-13 DIAGNOSIS — I5022 Chronic systolic (congestive) heart failure: Secondary | ICD-10-CM

## 2014-09-13 LAB — BASIC METABOLIC PANEL
BUN: 11 mg/dL (ref 6–23)
CALCIUM: 9.7 mg/dL (ref 8.4–10.5)
CHLORIDE: 101 meq/L (ref 96–112)
CO2: 29 mEq/L (ref 19–32)
Creatinine, Ser: 0.89 mg/dL (ref 0.40–1.50)
GFR: 92.65 mL/min (ref >60.00)
Glucose, Bld: 139 mg/dL — ABNORMAL HIGH (ref 70–99)
POTASSIUM: 4.6 meq/L (ref 3.5–5.1)
Sodium: 135 mEq/L (ref 135–145)

## 2014-09-13 LAB — LIPID PANEL
CHOL/HDL RATIO: 3
Cholesterol: 106 mg/dL (ref 0–200)
HDL: 31.1 mg/dL — ABNORMAL LOW (ref >39.00)
LDL Cholesterol: 49 mg/dL (ref 0–99)
NONHDL: 74.9
Triglycerides: 131 mg/dL (ref 0.0–149.0)
VLDL: 26.2 mg/dL (ref 0.0–40.0)

## 2014-09-13 LAB — HEMOGLOBIN A1C: Hgb A1c MFr Bld: 7.7 % — ABNORMAL HIGH (ref 4.6–6.5)

## 2014-09-13 MED ORDER — TRAMADOL HCL 50 MG PO TABS: 50.0000 mg | ORAL_TABLET | Freq: Two times a day (BID) | ORAL | Status: DC | PRN

## 2014-09-13 NOTE — Assessment & Plan Note (Signed)
Coreg , Diovan, Aldactone, ASA

## 2014-09-13 NOTE — Progress Notes (Signed)
Pre visit review using our clinic review tool, if applicable. No additional management support is needed unless otherwise documented below in the visit note. 

## 2014-09-13 NOTE — Assessment & Plan Note (Signed)
Reduced ejection fraction after her coronary disease and bypass surgery in the past. Most recent EF 30-35%, echo, November, 2014  Coreg , Diovan, Aldactone

## 2014-09-13 NOTE — Assessment & Plan Note (Signed)
Advil prn Call if not better

## 2014-09-13 NOTE — Progress Notes (Signed)
   Subjective:   HPI   C/o chest soreness x 1 week after pulled a heavy motor on the roof - 200 lbs  The patient needs to address  chronic hypertension that has been well controlled with medicines; to address chronic  hyperlipidemia controlled with medicines as well; and to address CAD, type 2 pre diabetes, controlled with medical treatment and diet.   BP Readings from Last 3 Encounters:  09/13/14 100/70  05/15/14 108/70  02/21/14 108/64   Wt Readings from Last 3 Encounters:  09/13/14 247 lb (112.038 kg)  05/15/14 248 lb 8 oz (112.719 kg)  02/21/14 252 lb 6.4 oz (114.488 kg)      Review of Systems  Constitutional: Negative for chills, appetite change, fatigue and unexpected weight change.  HENT: Negative for congestion, nosebleeds, sneezing, sore throat and trouble swallowing.   Eyes: Negative for itching and visual disturbance.  Respiratory: Negative for cough.   Cardiovascular: Negative for chest pain, palpitations and leg swelling.  Gastrointestinal: Negative for nausea, diarrhea, blood in stool and abdominal distention.  Genitourinary: Negative for frequency and hematuria.  Musculoskeletal: Positive for arthralgias. Negative for back pain, joint swelling, gait problem and neck pain.  Skin: Negative for rash.  Neurological: Negative for dizziness, tremors, speech difficulty and weakness.  Psychiatric/Behavioral: Negative for sleep disturbance, dysphoric mood and agitation. The patient is not nervous/anxious.        Objective:   Physical Exam  Constitutional: He is oriented to person, place, and time. He appears well-developed. No distress.  Obese NAD  HENT:  Mouth/Throat: Oropharynx is clear and moist.  Eyes: Conjunctivae are normal. Pupils are equal, round, and reactive to light.  Neck: Normal range of motion. No JVD present. No thyromegaly present.  Cardiovascular: Normal rate, regular rhythm, normal heart sounds and intact distal pulses.  Exam reveals no gallop  and no friction rub.   No murmur heard. Pulmonary/Chest: Effort normal and breath sounds normal. No respiratory distress. He has no wheezes. He has no rales. He exhibits no tenderness.  Abdominal: Soft. Bowel sounds are normal. He exhibits no distension and no mass. There is no tenderness. There is no rebound and no guarding.  Musculoskeletal: Normal range of motion. He exhibits no edema or tenderness.  Lymphadenopathy:    He has no cervical adenopathy.  Neurological: He is alert and oriented to person, place, and time. He has normal reflexes. No cranial nerve deficit. He exhibits normal muscle tone. He displays a negative Romberg sign. Coordination and gait normal.  No meningeal signs  Skin: Skin is warm and dry. No rash noted.  Psychiatric: He has a normal mood and affect. His behavior is normal. Judgment and thought content normal.  sternum is sore  Lab Results  Component Value Date   WBC 10.1 05/04/2013   HGB 14.8 05/04/2013   HCT 43.5 05/04/2013   PLT 215.0 05/04/2013   GLUCOSE 205* 05/15/2014   CHOL 128 08/13/2013   TRIG 111.0 08/13/2013   HDL 36.90* 08/13/2013   LDLDIRECT 198.1 02/08/2013   LDLCALC 69 08/13/2013   ALT 29 08/13/2013   AST 19 08/13/2013   NA 133* 05/15/2014   K 4.4 05/15/2014   CL 100 05/15/2014   CREATININE 1.0 05/15/2014   BUN 13 05/15/2014   CO2 27 05/15/2014   TSH 0.77 05/04/2013   PSA 0.80 02/08/2013   HGBA1C 7.8* 05/15/2014        Assessment & Plan:

## 2014-09-13 NOTE — Assessment & Plan Note (Signed)
Coreg , Diovan, Aldactone 

## 2014-09-13 NOTE — Assessment & Plan Note (Signed)
On Janumet 

## 2014-09-13 NOTE — Assessment & Plan Note (Signed)
Remote - by history ASA No relapse  Coreg , Diovan, Aldactone

## 2014-11-04 ENCOUNTER — Other Ambulatory Visit: Payer: Self-pay | Admitting: Internal Medicine

## 2014-12-11 ENCOUNTER — Other Ambulatory Visit: Payer: Self-pay | Admitting: Cardiology

## 2014-12-11 DIAGNOSIS — I6523 Occlusion and stenosis of bilateral carotid arteries: Secondary | ICD-10-CM

## 2014-12-17 ENCOUNTER — Ambulatory Visit (HOSPITAL_COMMUNITY): Payer: BLUE CROSS/BLUE SHIELD | Attending: Cardiology

## 2014-12-17 DIAGNOSIS — I6523 Occlusion and stenosis of bilateral carotid arteries: Secondary | ICD-10-CM | POA: Diagnosis present

## 2015-01-13 ENCOUNTER — Ambulatory Visit: Payer: BLUE CROSS/BLUE SHIELD | Admitting: Internal Medicine

## 2015-01-30 ENCOUNTER — Other Ambulatory Visit: Payer: Self-pay | Admitting: Cardiology

## 2015-03-02 ENCOUNTER — Other Ambulatory Visit: Payer: Self-pay | Admitting: Cardiology

## 2015-03-03 ENCOUNTER — Other Ambulatory Visit: Payer: Self-pay | Admitting: Internal Medicine

## 2015-03-03 MED ORDER — VALSARTAN 160 MG PO TABS: 160.0000 mg | ORAL_TABLET | Freq: Every day | ORAL | Status: DC

## 2015-04-01 ENCOUNTER — Other Ambulatory Visit: Payer: Self-pay | Admitting: Cardiology

## 2015-04-04 ENCOUNTER — Encounter: Payer: Self-pay | Admitting: Internal Medicine

## 2015-04-04 ENCOUNTER — Ambulatory Visit (INDEPENDENT_AMBULATORY_CARE_PROVIDER_SITE_OTHER): Payer: 59 | Admitting: Internal Medicine

## 2015-04-04 VITALS — BP 100/60 | HR 61 | Wt 245.0 lb

## 2015-04-04 DIAGNOSIS — I10 Essential (primary) hypertension: Secondary | ICD-10-CM

## 2015-04-04 DIAGNOSIS — Z23 Encounter for immunization: Secondary | ICD-10-CM | POA: Diagnosis not present

## 2015-04-04 DIAGNOSIS — D485 Neoplasm of uncertain behavior of skin: Secondary | ICD-10-CM | POA: Diagnosis not present

## 2015-04-04 DIAGNOSIS — I2510 Atherosclerotic heart disease of native coronary artery without angina pectoris: Secondary | ICD-10-CM | POA: Diagnosis not present

## 2015-04-04 DIAGNOSIS — I739 Peripheral vascular disease, unspecified: Secondary | ICD-10-CM | POA: Diagnosis not present

## 2015-04-04 DIAGNOSIS — I2583 Coronary atherosclerosis due to lipid rich plaque: Principal | ICD-10-CM

## 2015-04-04 MED ORDER — ROSUVASTATIN CALCIUM 40 MG PO TABS: 40.0000 mg | ORAL_TABLET | Freq: Every day | ORAL | Status: DC

## 2015-04-04 NOTE — Assessment & Plan Note (Signed)
Coreg, aldactone

## 2015-04-04 NOTE — Progress Notes (Signed)
Subjective:  Patient ID: [name], male    DOB: 1956/04/27  Age: 59 y.o. MRN: [id number]  CC: No chief complaint on file.   HPI KOLE HILYARD presents for HTN, CAD, dyslipidemia f/u. Vytorin is too $$$  Outpatient Prescriptions Prior to Visit  Medication Sig Dispense Refill  . aspirin 81 MG EC tablet Take 81 mg by mouth daily.      . carvedilol (COREG) 25 MG tablet TAKE 1 TABLET TWICE A DAY WITH MEALS 180 tablet 2  . Cholecalciferol (VITAMIN D3) 1000 UNITS tablet Take 1,000 Units by mouth daily.      Marland Kitchen ibuprofen (ADVIL,MOTRIN) 600 MG tablet Take 1 tablet (600 mg total) by mouth as needed. 100 tablet 3  . JANUMET 50-500 MG per tablet TAKE 1 TABLET TWICE A DAY WITH MEALS 60 tablet 5  . loratadine (CLARITIN) 10 MG tablet Take 1 tablet (10 mg total) by mouth daily. 100 tablet 3  . pantoprazole (PROTONIX) 40 MG tablet TAKE 1 TABLET (40 MG TOTAL) BY MOUTH DAILY. 90 tablet 2  . spironolactone (ALDACTONE) 25 MG tablet TAKE ONE-HALF TABLET (12.5MG ) DAILY, MAKE APPT FOR FUTURE REFILLS 7 tablet 0  . traMADol (ULTRAM) 50 MG tablet Take 1-2 tablets (50-100 mg total) by mouth 2 (two) times daily as needed. 100 tablet 1  . valsartan (DIOVAN) 160 MG tablet Take 1 tablet (160 mg total) by mouth daily. 90 tablet 2  . VYTORIN 10-80 MG per tablet TAKE 1 TABLET BY MOUTH EVERY DAY 90 tablet 2   No facility-administered medications prior to visit.    ROS Review of Systems  Constitutional: Negative for appetite change, fatigue and unexpected weight change.  HENT: Negative for congestion, nosebleeds, sneezing, sore throat and trouble swallowing.   Eyes: Negative for itching and visual disturbance.  Respiratory: Negative for cough.   Cardiovascular: Negative for chest pain, palpitations and leg swelling.  Gastrointestinal: Negative for nausea, diarrhea, blood in stool and abdominal distention.  Genitourinary: Negative for frequency and hematuria.  Musculoskeletal: Negative for back pain, joint  swelling, gait problem and neck pain.  Skin: Negative for rash.  Neurological: Negative for dizziness, tremors, speech difficulty and weakness.  Psychiatric/Behavioral: Negative for suicidal ideas, sleep disturbance, dysphoric mood and agitation. The patient is not nervous/anxious.     Objective:  BP 100/60 mmHg  Pulse 61  Wt 245 lb (111.131 kg)  SpO2 96%  BP Readings from Last 3 Encounters:  04/04/15 100/60  09/13/14 100/70  05/15/14 108/70    Wt Readings from Last 3 Encounters:  04/04/15 245 lb (111.131 kg)  09/13/14 247 lb (112.038 kg)  05/15/14 248 lb 8 oz (112.719 kg)    Physical Exam  Constitutional: He is oriented to person, place, and time. He appears well-developed. No distress.  NAD  HENT:  Mouth/Throat: Oropharynx is clear and moist.  Eyes: Conjunctivae are normal. Pupils are equal, round, and reactive to light.  Neck: Normal range of motion. No JVD present. No thyromegaly present.  Cardiovascular: Normal rate, regular rhythm, normal heart sounds and intact distal pulses.  Exam reveals no gallop and no friction rub.   No murmur heard. Pulmonary/Chest: Effort normal and breath sounds normal. No respiratory distress. He has no wheezes. He has no rales. He exhibits no tenderness.  Abdominal: Soft. Bowel sounds are normal. He exhibits mass. He exhibits no distension. There is no tenderness. There is no rebound and no guarding.  Musculoskeletal: Normal range of motion. He exhibits no edema or tenderness.  Lymphadenopathy:    He has no cervical adenopathy.  Neurological: He is alert and oriented to person, place, and time. He has normal reflexes. No cranial nerve deficit. He exhibits normal muscle tone. He displays a negative Romberg sign. Coordination and gait normal.  Skin: Skin is warm and dry. No rash noted.  Psychiatric: He has a normal mood and affect. His behavior is normal. Judgment and thought content normal.  midline hernia 1 cm R cheek lesion   Lab Results    Component Value Date   WBC 10.1 05/04/2013   HGB 14.8 05/04/2013   HCT 43.5 05/04/2013   PLT 215.0 05/04/2013   GLUCOSE 139* 09/13/2014   CHOL 106 09/13/2014   TRIG 131.0 09/13/2014   HDL 31.10* 09/13/2014   LDLDIRECT 198.1 02/08/2013   LDLCALC 49 09/13/2014   ALT 29 08/13/2013   AST 19 08/13/2013   NA 135 09/13/2014   K 4.6 09/13/2014   CL 101 09/13/2014   CREATININE 0.89 09/13/2014   BUN 11 09/13/2014   CO2 29 09/13/2014   TSH 0.77 05/04/2013   PSA 0.80 02/08/2013   HGBA1C 7.7* 09/13/2014    Dg Eye Foreign Body  05/05/2012   *RADIOLOGY REPORT*  Clinical Data: 59 year old male with history of metal exposure to the orbits, planned MRI.  ORBITS FOR FOREIGN BODY - 2 VIEW  Comparison: None.  Findings: There is no evidence of metallic foreign body within the orbits.  No significant bone abnormality identified. .  IMPRESSION:  No evidence of metallic foreign body within the orbits.   Original Report Authenticated By: Roselyn Reef, M.D.   Mr Hand Right W Wo Contrast  05/05/2012   *RADIOLOGY REPORT*  Clinical Data: Right thumb and thenar eminence pain.  Burning sensation.  BUN and creatinine were obtained on site at [facility] at [address] Results:  BUN 9 mg/dL,  Creatinine 0.9 mg/dL.  MRI OF THE RIGHT HAND WITHOUT AND WITH CONTRAST  Technique:  Multiplanar, multisequence MR imaging was performed both before and after administration of intravenous contrast.  Contrast: 67mL MULTIHANCE GADOBENATE DIMEGLUMINE 529 MG/ML IV SOLN  Comparison: None.  Findings: There is no edema in the muscles of the thenar eminence. The median nerve is of normal signal intensity within the carpal tunnel.  It does have a more anterior to posterior orientation within the carpal tunnel than usual but this can be seen as a normal variant.  There is no evidence of a neuroma or other abnormality of the branches of the median nerve to the thenar eminence.  There is what appears to be a small metallic  artifact in the soft tissues of the anterior aspect of the thumb at the level of the shaft of the proximal phalangeal bone.  Has the patient had any injections are soft tissue injuries at that site.  The underlying flexor tendon appears normal.  There is minimal edema in the base of the proximal phalangeal j bone of the thumb consistent with slight arthritis.  After contrast administration there is no pathologic enhancement.  IMPRESSION:  1.  No evidence of a lesion of the median nerve or its branches in the hand. 2.  No edema of the muscles of the thenar eminence. 3.  Small probable metallic artifact in the soft tissues of the proximal thumb. 4.  Minimal arthritic changes of the first metacarpal phalangeal joint.   Original Report Authenticated By: Lorriane Shire, M.D.    Assessment & Plan:   Diagnoses and all  orders for this visit:  Coronary artery disease due to lipid rich plaque -     Ambulatory referral to Cardiology -     Basic metabolic panel; Future -     Hemoglobin A1c; Future -     Hepatic function panel; Future -     Lipid panel; Future -     TSH; Future -     Basic metabolic panel; Future -     Hemoglobin A1c; Future -     Hepatic function panel; Future -     Lipid panel; Future -     TSH; Future -     CBC with Differential/Platelet; Future  Need for influenza vaccination -     Flu Vaccine QUAD 36+ mos IM -     Basic metabolic panel; Future -     Hemoglobin A1c; Future -     Hepatic function panel; Future -     Lipid panel; Future -     TSH; Future -     Basic metabolic panel; Future -     Hemoglobin A1c; Future -     Hepatic function panel; Future -     Lipid panel; Future -     TSH; Future -     CBC with Differential/Platelet; Future  Essential hypertension -     Basic metabolic panel; Future -     Hemoglobin A1c; Future -     Hepatic function panel; Future -     Lipid panel; Future -     TSH; Future -     Basic metabolic panel; Future -     Hemoglobin A1c;  Future -     Hepatic function panel; Future -     Lipid panel; Future -     TSH; Future -     CBC with Differential/Platelet; Future  Peripheral vascular disease -     Basic metabolic panel; Future -     Hemoglobin A1c; Future -     Hepatic function panel; Future -     Lipid panel; Future -     TSH; Future -     Basic metabolic panel; Future -     Hemoglobin A1c; Future -     Hepatic function panel; Future -     Lipid panel; Future -     TSH; Future -     CBC with Differential/Platelet; Future  Neoplasm of uncertain behavior of skin -     Ambulatory referral to Dermatology  Other orders -     rosuvastatin (CRESTOR) 40 MG tablet; Take 1 tablet (40 mg total) by mouth daily.   I have discontinued Mr. Satya Buttram. I am also having him start on rosuvastatin. Additionally, I am having him maintain his aspirin, cholecalciferol, ibuprofen, loratadine, carvedilol, JANUMET, traMADol, pantoprazole, valsartan, and spironolactone.  Meds ordered this encounter  Medications  . rosuvastatin (CRESTOR) 40 MG tablet    Sig: Take 1 tablet (40 mg total) by mouth daily.    Dispense:  90 tablet    Refill:  3     Follow-up: Return in about 4 months (around 08/04/2015) for a follow-up visit.  [name], MD

## 2015-04-04 NOTE — Progress Notes (Signed)
Pre visit review using our clinic review tool, if applicable. No additional management support is needed unless otherwise documented below in the visit note. 

## 2015-04-04 NOTE — Assessment & Plan Note (Addendum)
Coreg , Diovan, Aldactone, ASA. Vytorin $$$, Crestor 9/16 Card f/up

## 2015-04-04 NOTE — Assessment & Plan Note (Signed)
Vytorin $$$, Crestor 9/16

## 2015-04-04 NOTE — Assessment & Plan Note (Signed)
Vytorin $$$, Crestor 9/16 ASA

## 2015-04-16 ENCOUNTER — Encounter: Payer: Self-pay | Admitting: *Deleted

## 2015-04-16 ENCOUNTER — Other Ambulatory Visit: Payer: Self-pay | Admitting: Internal Medicine

## 2015-04-16 ENCOUNTER — Telehealth: Payer: Self-pay | Admitting: *Deleted

## 2015-04-16 DIAGNOSIS — I5022 Chronic systolic (congestive) heart failure: Secondary | ICD-10-CM

## 2015-04-16 NOTE — Telephone Encounter (Signed)
This encounter was created in error - please disregard.

## 2015-04-16 NOTE — Telephone Encounter (Signed)
Called patient to follow up with him from one year ago. Patient was last seen in July of last year, by Dr. Caryl Comes, and was recommended then for ICD secondary to CHF/ICM.  Patient at the time wanted SICD which was not approved through his insurance then [facility]).  His plan was to argue with them about this matter.  We never heard back from him on this. I reviewed with patient the need for follow up with this.  Explained that he would need a repeat echo to see if EF still depressed/improved.  Also discussed need for OV w/ Caryl Comes to review current health status and if ICD is still warranted. Patient is aware of risks by waiting, but would like to wait until beginning of next year before doing any of this.  His job has recently [employer], to [facility] [employer] [employer], and he has had to start all over w/ out of pocket cost for the remainder of the year. Informed patient that I would review need for repeat echo/OV with Dr. Caryl Comes and let him know recommendation.   Explained that if he would like to wait until the beginning of next year to re-evaluate, and as long as he understood his risk then we could wait.  Patient verbalized understanding and agreeable to plan.

## 2015-04-18 NOTE — Telephone Encounter (Signed)
Informed patient that Dr. Caryl Comes recommends follow up echo and OV.  He is aware patient will wait until next year before testing and visit. Patient would like to discuss with his insurance what his out of pocket expense will be for this, to decide when he wants to schedule. Patient aware I will call him in a couple of weeks to determine when to schedule him, allowing him enough time to talk with his insurance.

## 2015-05-09 ENCOUNTER — Encounter: Payer: Self-pay | Admitting: Cardiovascular Disease

## 2015-05-09 ENCOUNTER — Ambulatory Visit (INDEPENDENT_AMBULATORY_CARE_PROVIDER_SITE_OTHER): Payer: 59 | Admitting: Cardiovascular Disease

## 2015-05-09 VITALS — BP 108/68 | HR 65 | Ht 72.0 in | Wt 246.1 lb

## 2015-05-09 DIAGNOSIS — I2583 Coronary atherosclerosis due to lipid rich plaque: Principal | ICD-10-CM

## 2015-05-09 DIAGNOSIS — I5022 Chronic systolic (congestive) heart failure: Secondary | ICD-10-CM

## 2015-05-09 DIAGNOSIS — I2510 Atherosclerotic heart disease of native coronary artery without angina pectoris: Secondary | ICD-10-CM

## 2015-05-09 NOTE — Patient Instructions (Signed)

## 2015-05-09 NOTE — Progress Notes (Signed)
Cardiology Office Note   Date:  05/09/2015   ID:  [name], DOB [birth year], MRN [id number]  PCP:  [name], MD  Cardiologist:   Acie Fredrickson Wonda Cheng, MD  Transfer from Dr. Ron Parker   Chief Complaint  Patient presents with  . Follow-up    CAD, CHF   Problem list 1. Coronary artery disease - status post CABG in 2003 2. Chronic systolic congestive heart failure - EF 30-35% 3. Essential hypertension 4. Hyperlipidemia 5. Diabetes Mellitus   History of Present Illness: [name] is a 59 y.o. male who presents to establish care. He is a previous patient of Dr. Ron Parker.  He has a history of coronary artery disease and coronary artery bypass grafting. He also has a history of hypertension, hyperlipidemia and diabetes.  Works in Education [employer], [employer].   He is busy at work but does not get any regular aerobic exercise. Does not have the endurance that he used to .     Past Medical History  Diagnosis Date  . Hypertension   . Peripheral vascular disease ([place])     Dr. Erskin Burnet  . TIA (transient ischemic attack)     by history  . Aortic valve sclerosis     aortic valve sclerosis  . CAD (coronary artery disease)     nuclear 2005  /    . 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 Ejection fraction < 50%     EF 40%, echo 2006, no ICD needed  / EF 40%, echo, 11/2009  . Hyperlipidemia     Low HDL  . Former cigarette smoker   . Volume overload   . Carotid artery disease ([place])     Doppler, July, 2011, stable, chronic total occlusion LCCA, retrograde flow from LECA to LICA, ,, 5-83%ENMM,,, 76-80% RICA  . Sleep disorder     Dr Gwenette Greet, lose weight and consider sleep study later  . Mitral regurgitation     mild, echo, 11/2009  . Dizziness     Past Surgical History  Procedure Laterality Date  . Coronary artery bypass graft    . Colonoscopy  03/19/2009     Current Outpatient Prescriptions  Medication Sig Dispense Refill  . aspirin 81 MG EC tablet Take 81 mg by mouth daily.        . carvedilol (COREG) 25 MG tablet TAKE 1 TABLET TWICE A DAY WITH MEALS 180 tablet 2  . Cholecalciferol (VITAMIN D3) 1000 UNITS tablet Take 1,000 Units by mouth daily.      Marland Kitchen ibuprofen (ADVIL,MOTRIN) 600 MG tablet Take 1 tablet (600 mg total) by mouth as needed. 100 tablet 3  . JANUMET 50-500 MG tablet TAKE 1 TABLET TWICE A DAY WITH MEALS 60 tablet 11  . loratadine (CLARITIN) 10 MG tablet Take 1 tablet (10 mg total) by mouth daily. 100 tablet 3  . pantoprazole (PROTONIX) 40 MG tablet TAKE 1 TABLET (40 MG TOTAL) BY MOUTH DAILY. 90 tablet 2  . rosuvastatin (CRESTOR) 40 MG tablet Take 1 tablet (40 mg total) by mouth daily. 90 tablet 3  . spironolactone (ALDACTONE) 25 MG tablet TAKE ONE-HALF TABLET (12.5MG ) DAILY, MAKE APPT FOR FUTURE REFILLS 7 tablet 0  . traMADol (ULTRAM) 50 MG tablet Take 1-2 tablets (50-100 mg total) by mouth 2 (two) times daily as needed. 100 tablet 1  . valsartan (DIOVAN) 160 MG tablet Take 1 tablet (160 mg total) by mouth daily. 90 tablet 2   No current facility-administered  medications for this visit.    Allergies:   Review of patient's allergies indicates no known allergies.    Social History:  The patient  reports that he quit smoking about 13 years ago. He does not have any smokeless tobacco history on file. He reports that he does not drink alcohol or use illicit drugs.   Family History:  The patient's family history includes Arthritis in his father; COPD in his father; Coronary artery disease in an other family member; Diabetes in his father and another family member; Heart disease in his father; Stroke in his mother and another family member.    ROS:  Please see the history of present illness.    Review of Systems: Constitutional:  denies fever, chills, diaphoresis, appetite change and fatigue.  HEENT: denies photophobia, eye pain, redness, hearing loss, ear pain, congestion, sore throat, rhinorrhea, sneezing, neck pain, neck stiffness and tinnitus.   Respiratory: denies   cough,    Cardiovascular: admits to chest pain, palpitations and leg swelling.  Gastrointestinal: denies nausea, vomiting, abdominal pain, diarrhea, constipation, blood in stool.  Genitourinary: denies dysuria, urgency, frequency, hematuria, flank pain and difficulty urinating.  Musculoskeletal: denies  myalgias, back pain, joint swelling, arthralgias and gait problem.   Skin: denies pallor, rash and wound.  Neurological: denies dizziness, seizures, syncope, weakness, light-headedness, numbness and headaches.   Hematological: denies adenopathy, easy bruising, personal or family bleeding history.  Psychiatric/ Behavioral: denies suicidal ideation, mood changes, confusion, nervousness, sleep disturbance and agitation.       All other systems are reviewed and negative.    PHYSICAL EXAM: VS:  BP 108/68 mmHg  Pulse 65  Ht 6' (1.829 m)  Wt 246 lb 1.9 oz (111.639 kg)  BMI 33.37 kg/m2 , BMI Body mass index is 33.37 kg/(m^2). GEN: Well nourished, well developed, in no acute distress HEENT: normal Neck: no JVD, moderately loud right carotid bruit. Very soft left carotid bruit.  No  masses Cardiac: RRR; soft systolic murmur ,  No  rubs, or gallops,no edema  Respiratory:  clear to auscultation bilaterally, normal work of breathing GI: soft, nontender, nondistended, + BS MS: no deformity or atrophy Skin: warm and dry, no rash Neuro:  Strength and sensation are intact Psych: normal   EKG:  EKG is ordered today. The ekg ordered today demonstrates   NSR at 65.  Frequent jPVCs  Possible Inf MI.     Recent Labs: 09/13/2014: BUN 11; Creatinine, Ser 0.89; Potassium 4.6; Sodium 135    Lipid Panel    Component Value Date/Time   CHOL 106 09/13/2014 0819   TRIG 131.0 09/13/2014 0819   TRIG 92 05/09/2006 0732   HDL 31.10* 09/13/2014 0819   CHOLHDL 3 09/13/2014 0819   CHOLHDL 4.2 CALC 05/09/2006 0732   VLDL 26.2 09/13/2014 0819   LDLCALC 49 09/13/2014 0819    LDLDIRECT 198.1 02/08/2013 1112      Wt Readings from Last 3 Encounters:  05/09/15 246 lb 1.9 oz (111.639 kg)  04/04/15 245 lb (111.131 kg)  09/13/14 247 lb (112.038 kg)      Other studies Reviewed: Additional studies/ records that were reviewed today include: . Review of the above records demonstrates:    ASSESSMENT AND PLAN:  1. Coronary artery disease - status post CABG in 2003 - - doing well. Continue current medications. 2. Chronic systolic congestive heart failure - EF 30-35%-  Continue current medications. 3. Essential hypertension- blood pressure is well-controlled. 4. Hyperlipidemia- we'll check fasting labs at next visit.  5. Diabetes Mellitus   Current medicines are reviewed at length with the patient today.  The patient does not have concerns regarding medicines.  The following changes have been made:  no change  Labs/ tests ordered today include:  No orders of the defined types were placed in this encounter.     Disposition:   FU with me in 6 months      Nahser, Wonda Cheng, MD  05/09/2015 3:06 PM    [facility] [place], [place], [place]  [address] Phone: [phone]; Fax: [phone]   [facility]  [address] [place], [place]  [address] [phone]   Fax [phone]

## 2015-05-13 ENCOUNTER — Telehealth: Payer: Self-pay

## 2015-05-13 NOTE — Telephone Encounter (Signed)
Ok Kombiglyze Thx

## 2015-05-13 NOTE — Telephone Encounter (Signed)
PA for Janumet started 05/10/2015.   PA was denied. Sending to PCP for advisement.   Alternatives to try and fail are: Nash Shearer, Kombigliyze XR.

## 2015-05-14 ENCOUNTER — Encounter: Payer: Self-pay | Admitting: Gastroenterology

## 2015-05-14 NOTE — Telephone Encounter (Signed)
LVM for pt to call back as soon as possible.   RE: inform of PA denial and rx change as listed below per PCP and verify pharmacy.

## 2015-05-15 NOTE — Telephone Encounter (Signed)
Patient would like to schedule echo and OV for February. He is aware Melissa, scheduler, will contact him within the next several weeks to arrange these appts. Informed him that we will need the echo prior to appt w/ Dr. Caryl Comes so that we can determine if ICD is still needed or not. Patient verbalized understanding and agreeable to plan.

## 2015-05-15 NOTE — Addendum Note (Signed)
Addended by: Stanton Kidney on: 05/15/2015 10:42 AM   Modules accepted: Orders

## 2015-05-20 NOTE — Telephone Encounter (Signed)
LVM for pt to call back as soon as possible.   RE: pending med until pt calls back to confirm pt agrees with medication change.

## 2015-05-25 ENCOUNTER — Other Ambulatory Visit: Payer: Self-pay | Admitting: Cardiology

## 2015-05-27 ENCOUNTER — Other Ambulatory Visit: Payer: Self-pay

## 2015-05-27 MED ORDER — CARVEDILOL 25 MG PO TABS: 25.0000 mg | ORAL_TABLET | Freq: Two times a day (BID) | ORAL | Status: DC

## 2015-06-20 MED ORDER — SAXAGLIPTIN-METFORMIN ER 5-1000 MG PO TB24: 1.0000 | ORAL_TABLET | Freq: Every day | ORAL | Status: DC

## 2015-07-18 ENCOUNTER — Other Ambulatory Visit (HOSPITAL_COMMUNITY): Payer: 59

## 2015-08-01 ENCOUNTER — Ambulatory Visit: Payer: 59 | Admitting: Internal Medicine

## 2015-08-06 ENCOUNTER — Ambulatory Visit: Payer: 59 | Admitting: Internal Medicine

## 2015-08-08 ENCOUNTER — Encounter: Payer: Self-pay | Admitting: Internal Medicine

## 2015-08-08 ENCOUNTER — Ambulatory Visit (INDEPENDENT_AMBULATORY_CARE_PROVIDER_SITE_OTHER): Payer: 59 | Admitting: Internal Medicine

## 2015-08-08 VITALS — BP 110/78 | HR 69 | Temp 99.2°F | Wt 240.0 lb

## 2015-08-08 DIAGNOSIS — M25512 Pain in left shoulder: Secondary | ICD-10-CM

## 2015-08-08 DIAGNOSIS — M25511 Pain in right shoulder: Secondary | ICD-10-CM | POA: Insufficient documentation

## 2015-08-08 DIAGNOSIS — E1159 Type 2 diabetes mellitus with other circulatory complications: Secondary | ICD-10-CM

## 2015-08-08 DIAGNOSIS — H833X3 Noise effects on inner ear, bilateral: Secondary | ICD-10-CM | POA: Diagnosis not present

## 2015-08-08 DIAGNOSIS — H833X9 Noise effects on inner ear, unspecified ear: Secondary | ICD-10-CM | POA: Insufficient documentation

## 2015-08-08 MED ORDER — METHYLPREDNISOLONE ACETATE 20 MG/ML IJ SUSP: 20.0000 mg | Freq: Once | INTRAMUSCULAR | Status: DC

## 2015-08-08 MED ORDER — METHYLPREDNISOLONE ACETATE 40 MG/ML IJ SUSP
40.0000 mg | Freq: Once | INTRAMUSCULAR | Status: AC
  Administered 2015-08-08: 40 mg via INTRA_ARTICULAR

## 2015-08-08 MED ORDER — PROMETHAZINE-CODEINE 6.25-10 MG/5ML PO SYRP
5.0000 mL | ORAL_SOLUTION | ORAL | Status: DC | PRN
Start: 2015-08-08 — End: 2015-10-08

## 2015-08-08 NOTE — Assessment & Plan Note (Signed)
Audiol ref made

## 2015-08-08 NOTE — Assessment & Plan Note (Signed)
2/17 L MSK/OA See Procedures

## 2015-08-08 NOTE — Patient Instructions (Signed)
Postprocedure instructions :    A Band-Aid should be left on for 12 hours. Injection therapy is not a cure itself. It is used in conjunction with other modalities. You can use nonsteroidal anti-inflammatories like ibuprofen , hot and cold compresses. Rest is recommended in the next 24 hours. You need to report immediately  if fever, chills or any signs of infection develop.    Use over-the-counter  "cold" medicines  such as  "Afrin" nasal spray for nasal congestion as directed instead. Use" Delsym" or" Robitussin" cough syrup varietis for cough.  You can use plain "Tylenol" or "Advil" for fever, chills and achyness. Use Halls or Ricola cough drops.   "Common cold" symptoms are usually triggered by a virus.  The antibiotics are usually not necessary. On average, a" viral cold" illness would take 4-7 days to resolve. Please, make an appointment if you are not better or if you're worse.

## 2015-08-08 NOTE — Progress Notes (Signed)
Subjective:  Patient ID: [name], male    DOB: 1956/03/01  Age: 60 y.o. MRN: [id number]  CC: No chief complaint on file.   HPI [name] presents for CAD, HTN, DM f/u. C/o URI sx's x 2 d. C/o L trap and L shoulder pain. C/o hearing loss   Outpatient Prescriptions Prior to Visit  Medication Sig Dispense Refill  . aspirin 81 MG EC tablet Take 81 mg by mouth daily.      . carvedilol (COREG) 25 MG tablet Take 1 tablet (25 mg total) by mouth 2 (two) times daily with a meal. 180 tablet 2  . Cholecalciferol (VITAMIN D3) 1000 UNITS tablet Take 1,000 Units by mouth daily.      Marland Kitchen ibuprofen (ADVIL,MOTRIN) 600 MG tablet Take 1 tablet (600 mg total) by mouth as needed. 100 tablet 3  . loratadine (CLARITIN) 10 MG tablet Take 1 tablet (10 mg total) by mouth daily. 100 tablet 3  . pantoprazole (PROTONIX) 40 MG tablet TAKE 1 TABLET (40 MG TOTAL) BY MOUTH DAILY. 90 tablet 2  . rosuvastatin (CRESTOR) 40 MG tablet Take 1 tablet (40 mg total) by mouth daily. 90 tablet 3  . Saxagliptin-Metformin (KOMBIGLYZE XR) 11-998 MG TB24 Take 1 tablet by mouth daily before breakfast. 90 tablet 0  . spironolactone (ALDACTONE) 25 MG tablet Take 0.5 tablets (12.5 mg total) by mouth daily. 15 tablet 11  . traMADol (ULTRAM) 50 MG tablet Take 1-2 tablets (50-100 mg total) by mouth 2 (two) times daily as needed. 100 tablet 1  . valsartan (DIOVAN) 160 MG tablet Take 1 tablet (160 mg total) by mouth daily. 90 tablet 2   No facility-administered medications prior to visit.    ROS Review of Systems  Constitutional: Negative for appetite change, fatigue and unexpected weight change.  HENT: Positive for congestion and hearing loss. Negative for dental problem, nosebleeds, sneezing, sore throat and trouble swallowing.   Eyes: Negative for itching and visual disturbance.  Respiratory: Positive for cough.   Cardiovascular: Negative for chest pain, palpitations and leg swelling.  Gastrointestinal: Negative for nausea,  diarrhea, blood in stool and abdominal distention.  Genitourinary: Negative for urgency, frequency and hematuria.  Musculoskeletal: Negative for back pain, joint swelling, gait problem and neck pain.  Skin: Negative for rash.  Neurological: Negative for dizziness, tremors, speech difficulty and weakness.  Psychiatric/Behavioral: Negative for sleep disturbance, dysphoric mood and agitation. The patient is not nervous/anxious.     Objective:  BP 110/78 mmHg  Pulse 69  Temp(Src) 99.2 F (37.3 C) (Oral)  Wt 240 lb (108.863 kg)  SpO2 97%  BP Readings from Last 3 Encounters:  08/08/15 110/78  05/09/15 108/68  04/04/15 100/60    Wt Readings from Last 3 Encounters:  08/08/15 240 lb (108.863 kg)  05/09/15 246 lb 1.9 oz (111.639 kg)  04/04/15 245 lb (111.131 kg)    Physical Exam  Constitutional: He is oriented to person, place, and time. He appears well-developed. No distress.  NAD  HENT:  Mouth/Throat: Oropharynx is clear and moist.  Eyes: Conjunctivae are normal. Pupils are equal, round, and reactive to light.  Neck: Normal range of motion. No JVD present. No thyromegaly present.  Cardiovascular: Normal rate, regular rhythm, normal heart sounds and intact distal pulses.  Exam reveals no gallop and no friction rub.   No murmur heard. Pulmonary/Chest: Effort normal and breath sounds normal. No respiratory distress. He has no wheezes. He has no rales. He exhibits no tenderness.  Abdominal: Soft.  Bowel sounds are normal. He exhibits no distension and no mass. There is tenderness. There is no rebound and no guarding.  Musculoskeletal: Normal range of motion. He exhibits no edema or tenderness.  Lymphadenopathy:    He has no cervical adenopathy.  Neurological: He is alert and oriented to person, place, and time. He has normal reflexes. No cranial nerve deficit. He exhibits normal muscle tone. He displays a negative Romberg sign. Coordination and gait normal.  Skin: Skin is warm and dry.  No rash noted.  Psychiatric: He has a normal mood and affect. His behavior is normal. Judgment and thought content normal.  L trap and shoulder hurt  Lab Results  Component Value Date   WBC 10.1 05/04/2013   HGB 14.8 05/04/2013   HCT 43.5 05/04/2013   PLT 215.0 05/04/2013   GLUCOSE 139* 09/13/2014   CHOL 106 09/13/2014   TRIG 131.0 09/13/2014   HDL 31.10* 09/13/2014   LDLDIRECT 198.1 02/08/2013   LDLCALC 49 09/13/2014   ALT 29 08/13/2013   AST 19 08/13/2013   NA 135 09/13/2014   K 4.6 09/13/2014   CL 101 09/13/2014   CREATININE 0.89 09/13/2014   BUN 11 09/13/2014   CO2 29 09/13/2014   TSH 0.77 05/04/2013   PSA 0.80 02/08/2013   HGBA1C 7.7* 09/13/2014    Dg Eye Foreign Body  05/05/2012  *RADIOLOGY REPORT* Clinical Data: 60 year old male with history of metal exposure to the orbits, planned MRI. ORBITS FOR FOREIGN BODY - 2 VIEW Comparison: None. Findings: There is no evidence of metallic foreign body within the orbits.  No significant bone abnormality identified. . IMPRESSION: No evidence of metallic foreign body within the orbits. Original Report Authenticated By: Roselyn Reef, M.D.   [name] Right W Wo Contrast  05/05/2012  *RADIOLOGY REPORT* Clinical Data: Right thumb and thenar eminence pain.  Burning sensation. BUN and creatinine were obtained on site at [facility] at [address] Results:  BUN 9 mg/dL,  Creatinine 0.9 mg/dL. MRI OF THE RIGHT Martinez WITHOUT AND WITH CONTRAST Technique:  Multiplanar, multisequence Lucas imaging was performed both before and after administration of intravenous contrast. Contrast: 67mL MULTIHANCE GADOBENATE DIMEGLUMINE 529 MG/ML IV SOLN Comparison: None. Findings: There is no edema in the muscles of the thenar eminence. The median nerve is of normal signal intensity within the carpal tunnel.  It does have a more anterior to posterior orientation within the carpal tunnel than usual but this can be seen as a normal variant.  There is no  evidence of a neuroma or other abnormality of the branches of the median nerve to the thenar eminence. There is what appears to be a small metallic artifact in the soft tissues of the anterior aspect of the thumb at the level of the shaft of the proximal phalangeal bone.  Has the patient had any injections are soft tissue injuries at that site.  The underlying flexor tendon appears normal.  There is minimal edema in the base of the proximal phalangeal j bone of the thumb consistent with slight arthritis. After contrast administration there is no pathologic enhancement. IMPRESSION: 1.  No evidence of a lesion of the median nerve or its branches in the Martinez. 2.  No edema of the muscles of the thenar eminence. 3.  Small probable metallic artifact in the soft tissues of the proximal thumb. 4.  Minimal arthritic changes of the first metacarpal phalangeal joint. Original Report Authenticated By: Lorriane Shire, M.D.  Procedure :Joint Injection,  L shoulder   Indication:  Subacromial bursitis with refractory  chronic pain.   Risks including unsuccessful procedure , bleeding, infection, bruising, skin atrophy, "steroid flare-up" and others were explained to the patient in detail as well as the benefits. Informed consent was obtained and signed.   Tthe patient was placed in a comfortable position. Lateral approach was used. Skin was prepped with Betadine and alcohol  and anesthetized with a cooling spray. Then, a 5 cc syringe with a 2 inch long 24-gauge needle was used for a joint injection.. The needle was advanced  Into the subacromial space.The bursa was injected with 3 mL of 2% lidocaine and 40 mg of Depo-Medrol .  Band-Aid was applied.   Tolerated well. Complications: None. Good pain relief following the procedure.   Postprocedure instructions :    A Band-Aid should be left on for 12 hours. Injection therapy is not a cure itself. It is used in conjunction with other modalities. You can use nonsteroidal  anti-inflammatories like ibuprofen , hot and cold compresses. Rest is recommended in the next 24 hours. You need to report immediately  if fever, chills or any signs of infection develop.   Procedure Note :    Trigger Point Injection:   Indication : Focal tender area identifiable by the location without other identifiable neurologic or musculoskeletal finding or pathology.   Risks including unsuccessful procedure , bleeding, infection, bruising, skin atrophy and others were explained to the patient in detail as well as the benefits. Informed consent was obtained and signed.   Tthe patient was placed in a comfortable position. 2 L trap points of maximum tenderness over paraspinal and trapezius muscles were marked and  the skin was prepped with Betadine and alcohol. 1 inch 25-gauge needle was used. The needle was advanced perpendicular to the skin. Each trigger point was injected with 1 mL of 2% lidocaine and 10 mg of Depo-Medrol in a usual fashion.  Band-Aids applied.   Tolerated well. Complications: None. Good pain relief following the procedure.   Assessment & Plan:   Diagnoses and all orders for this visit:  Hearing loss d/t noise, bilateral -     Ambulatory referral to Audiology  Controlled type 2 diabetes mellitus with other circulatory complication, without long-term current use of insulin (HCC)  Pain in joint of left shoulder -     methylPREDNISolone acetate (DEPO-MEDROL) injection 40 mg; Inject 1 mL (40 mg total) into the articular space once. -     Discontinue: methylPREDNISolone acetate (DEPO-MEDROL) 20 MG/ML injection 20 mg; Inject 1 mL (20 mg total) into the articular space once.  Other orders -     promethazine-codeine (PHENERGAN WITH CODEINE) 6.25-10 MG/5ML syrup; Take 5 mLs by mouth every 4 (four) hours as needed. -     methylPREDNISolone acetate (DEPO-MEDROL) injection 20 mg; Inject 0.5 mLs (20 mg total) into the articular space once.  I am having Lucas. Fredieu start on  promethazine-codeine. I am also having him maintain his aspirin, cholecalciferol, ibuprofen, loratadine, traMADol, pantoprazole, valsartan, rosuvastatin, Saxagliptin-Metformin, spironolactone, and carvedilol. We administered methylPREDNISolone acetate.  Meds ordered this encounter  Medications  . promethazine-codeine (PHENERGAN WITH CODEINE) 6.25-10 MG/5ML syrup    Sig: Take 5 mLs by mouth every 4 (four) hours as needed.    Dispense:  300 mL    Refill:  0  . methylPREDNISolone acetate (DEPO-MEDROL) injection 40 mg    Sig:   . DISCONTD: methylPREDNISolone acetate (DEPO-MEDROL) 20 MG/ML injection 20 mg  Sig:      Follow-up: Return in about 3 months (around 11/05/2015) for a follow-up visit.  [name], MD

## 2015-08-08 NOTE — Assessment & Plan Note (Signed)
Labs Janumet

## 2015-08-08 NOTE — Progress Notes (Signed)
Pre visit review using our clinic review tool, if applicable. No additional management support is needed unless otherwise documented below in the visit note. 

## 2015-08-10 MED ORDER — METHYLPREDNISOLONE ACETATE 40 MG/ML IJ SUSP
20.0000 mg | Freq: Once | INTRAMUSCULAR | Status: AC
  Administered 2015-08-11: 20 mg via INTRA_ARTICULAR

## 2015-09-24 ENCOUNTER — Other Ambulatory Visit (INDEPENDENT_AMBULATORY_CARE_PROVIDER_SITE_OTHER): Payer: 59

## 2015-09-24 DIAGNOSIS — Z23 Encounter for immunization: Secondary | ICD-10-CM

## 2015-09-24 DIAGNOSIS — I10 Essential (primary) hypertension: Secondary | ICD-10-CM

## 2015-09-24 DIAGNOSIS — I739 Peripheral vascular disease, unspecified: Secondary | ICD-10-CM | POA: Diagnosis not present

## 2015-09-24 DIAGNOSIS — I2583 Coronary atherosclerosis due to lipid rich plaque: Secondary | ICD-10-CM

## 2015-09-24 DIAGNOSIS — I2510 Atherosclerotic heart disease of native coronary artery without angina pectoris: Secondary | ICD-10-CM | POA: Diagnosis not present

## 2015-09-24 LAB — LIPID PANEL
CHOLESTEROL: 116 mg/dL (ref 0–200)
HDL: 31.8 mg/dL — ABNORMAL LOW (ref >39.00)
LDL CALC: 64 mg/dL (ref 0–99)
NONHDL: 83.83
TRIGLYCERIDES: 97 mg/dL (ref 0.0–149.0)
Total CHOL/HDL Ratio: 4
VLDL: 19.4 mg/dL (ref 0.0–40.0)

## 2015-09-24 LAB — HEMOGLOBIN A1C: HEMOGLOBIN A1C: 12.5 % — AB (ref 4.6–6.5)

## 2015-09-24 LAB — BASIC METABOLIC PANEL
BUN: 14 mg/dL (ref 6–23)
CALCIUM: 9.7 mg/dL (ref 8.4–10.5)
CO2: 29 mEq/L (ref 19–32)
CREATININE: 1.05 mg/dL (ref 0.40–1.50)
Chloride: 95 mEq/L — ABNORMAL LOW (ref 96–112)
GFR: 76.29 mL/min (ref >60.00)
Glucose, Bld: 410 mg/dL — ABNORMAL HIGH (ref 70–99)
Potassium: 4.5 mEq/L (ref 3.5–5.1)
Sodium: 132 mEq/L — ABNORMAL LOW (ref 135–145)

## 2015-09-24 LAB — HEPATIC FUNCTION PANEL
ALBUMIN: 4.3 g/dL (ref 3.5–5.2)
ALT: 22 U/L (ref 0–53)
AST: 17 U/L (ref 0–37)
Alkaline Phosphatase: 63 U/L (ref 39–117)
Bilirubin, Direct: 0.2 mg/dL (ref 0.0–0.3)
Total Bilirubin: 0.8 mg/dL (ref 0.2–1.2)
Total Protein: 8.1 g/dL (ref 6.0–8.3)

## 2015-09-24 LAB — TSH: TSH: 1.7 u[IU]/mL (ref 0.35–4.50)

## 2015-09-27 ENCOUNTER — Other Ambulatory Visit: Payer: Self-pay | Admitting: Internal Medicine

## 2015-10-08 ENCOUNTER — Ambulatory Visit (INDEPENDENT_AMBULATORY_CARE_PROVIDER_SITE_OTHER): Payer: 59 | Admitting: Internal Medicine

## 2015-10-08 ENCOUNTER — Encounter: Payer: Self-pay | Admitting: Internal Medicine

## 2015-10-08 VITALS — BP 110/64 | HR 68 | Wt 229.0 lb

## 2015-10-08 DIAGNOSIS — E1159 Type 2 diabetes mellitus with other circulatory complications: Secondary | ICD-10-CM

## 2015-10-08 DIAGNOSIS — E1165 Type 2 diabetes mellitus with hyperglycemia: Secondary | ICD-10-CM

## 2015-10-08 DIAGNOSIS — IMO0002 Reserved for concepts with insufficient information to code with codable children: Secondary | ICD-10-CM

## 2015-10-08 LAB — GLUCOSE, POCT (MANUAL RESULT ENTRY): POC GLUCOSE: 292 mg/dL — AB (ref 70–99)

## 2015-10-08 MED ORDER — GLUCOSE BLOOD VI STRP: ORAL_STRIP | Status: DC

## 2015-10-08 MED ORDER — ONETOUCH DELICA LANCETS FINE MISC: 1.0000 | Freq: Every day | Status: DC | PRN

## 2015-10-08 MED ORDER — CANAGLIFLOZIN 100 MG PO TABS: 100.0000 mg | ORAL_TABLET | Freq: Every day | ORAL | Status: DC

## 2015-10-08 MED ORDER — SAXAGLIPTIN-METFORMIN ER 5-1000 MG PO TB24: 1.0000 | ORAL_TABLET | Freq: Every day | ORAL | Status: DC

## 2015-10-08 NOTE — Progress Notes (Signed)
Pre visit review using our clinic review tool, if applicable. No additional management support is needed unless otherwise documented below in the visit note. 

## 2015-10-08 NOTE — Assessment & Plan Note (Addendum)
Pt is not sure if he has been taking his Kombiglyze - restart if not taking 4/17 added [facility] End[facility] consult Dr Loanne Drilling

## 2015-10-08 NOTE — Progress Notes (Addendum)
Subjective:  Patient ID: [name], male    DOB: [birth year]  Age: 60 y.o. MRN: [id number]  CC: No chief complaint on file.   HPI [name] presents for uncontrolled DM - worse, hearing loss, HTN f/u. Pt is not sure if he has been taking his Kombiglyze  Outpatient Prescriptions Prior to Visit  Medication Sig Dispense Refill  . aspirin 81 MG EC tablet Take 81 mg by mouth daily.      . carvedilol (COREG) 25 MG tablet TAKE 1 TABLET (25 MG TOTAL) BY MOUTH 2 (TWO) TIMES DAILY WITH A MEAL. 180 tablet 1  . Cholecalciferol (VITAMIN D3) 1000 UNITS tablet Take 1,000 Units by mouth daily.      Marland Kitchen ibuprofen (ADVIL,MOTRIN) 600 MG tablet Take 1 tablet (600 mg total) by mouth as needed. 100 tablet 3  . pantoprazole (PROTONIX) 40 MG tablet TAKE 1 TABLET (40 MG TOTAL) BY MOUTH DAILY. 90 tablet 2  . rosuvastatin (CRESTOR) 40 MG tablet Take 1 tablet (40 mg total) by mouth daily. 90 tablet 3  . spironolactone (ALDACTONE) 25 MG tablet Take 0.5 tablets (12.5 mg total) by mouth daily. 15 tablet 11  . traMADol (ULTRAM) 50 MG tablet Take 1-2 tablets (50-100 mg total) by mouth 2 (two) times daily as needed. 100 tablet 1  . valsartan (DIOVAN) 160 MG tablet Take 1 tablet (160 mg total) by mouth daily. 90 tablet 2  . loratadine (CLARITIN) 10 MG tablet Take 1 tablet (10 mg total) by mouth daily. (Patient not taking: Reported on 10/08/2015) 100 tablet 3  . promethazine-codeine (PHENERGAN WITH CODEINE) 6.25-10 MG/5ML syrup Take 5 mLs by mouth every 4 (four) hours as needed. (Patient not taking: Reported on 10/08/2015) 300 mL 0  . Saxagliptin-Metformin (KOMBIGLYZE XR) 11-998 MG TB24 Take 1 tablet by mouth daily before breakfast. (Patient not taking: Reported on 10/08/2015) 90 tablet 0   No facility-administered medications prior to visit.    ROS Review of Systems  Constitutional: Negative for appetite change, fatigue and unexpected weight change.  HENT: Negative for congestion, nosebleeds, sneezing, sore throat and  trouble swallowing.   Eyes: Negative for itching and visual disturbance.  Respiratory: Negative for cough.   Cardiovascular: Negative for chest pain, palpitations and leg swelling.  Gastrointestinal: Negative for nausea, diarrhea, blood in stool and abdominal distention.  Endocrine: Positive for polydipsia and polyuria.  Genitourinary: Negative for frequency and hematuria.  Musculoskeletal: Negative for back pain, joint swelling, gait problem and neck pain.  Skin: Negative for rash.  Neurological: Negative for dizziness, tremors, speech difficulty and weakness.  Psychiatric/Behavioral: Negative for suicidal ideas, sleep disturbance, dysphoric mood and agitation. The patient is not nervous/anxious.     Objective:  BP 110/64 mmHg  Pulse 68  Wt 229 lb (103.874 kg)  SpO2 95%  BP Readings from Last 3 Encounters:  10/08/15 110/64  08/08/15 110/78  05/09/15 108/68    Wt Readings from Last 3 Encounters:  10/08/15 229 lb (103.874 kg)  08/08/15 240 lb (108.863 kg)  05/09/15 246 lb 1.9 oz (111.639 kg)    Physical Exam  Constitutional: He is oriented to person, place, and time. He appears well-developed. No distress.  NAD  HENT:  Mouth/Throat: Oropharynx is clear and moist.  Eyes: Conjunctivae are normal. Pupils are equal, round, and reactive to light.  Neck: Normal range of motion. No JVD present. No thyromegaly present.  Cardiovascular: Normal rate, regular rhythm, normal heart sounds and intact distal pulses.  Exam reveals no gallop and  no friction rub.   No murmur heard. Pulmonary/Chest: Effort normal and breath sounds normal. No respiratory distress. He has no wheezes. He has no rales. He exhibits no tenderness.  Abdominal: Soft. Bowel sounds are normal. He exhibits no distension and no mass. There is no tenderness. There is no rebound and no guarding.  Musculoskeletal: Normal range of motion. He exhibits no edema or tenderness.  Lymphadenopathy:    He has no cervical adenopathy.   Neurological: He is alert and oriented to person, place, and time. He has normal reflexes. No cranial nerve deficit. He exhibits normal muscle tone. He displays a negative Romberg sign. Coordination and gait normal.  Skin: Skin is warm and dry. No rash noted.  Psychiatric: He has a normal mood and affect. His behavior is normal. Judgment and thought content normal.  hearing aids are in Obese  Lab Results  Component Value Date   WBC 10.1 05/04/2013   HGB 14.8 05/04/2013   HCT 43.5 05/04/2013   PLT 215.0 05/04/2013   GLUCOSE 410* 09/24/2015   CHOL 116 09/24/2015   TRIG 97.0 09/24/2015   HDL 31.80* 09/24/2015   LDLDIRECT 198.1 02/08/2013   LDLCALC 64 09/24/2015   ALT 22 09/24/2015   AST 17 09/24/2015   NA 132* 09/24/2015   K 4.5 09/24/2015   CL 95* 09/24/2015   CREATININE 1.05 09/24/2015   BUN 14 09/24/2015   CO2 29 09/24/2015   TSH 1.70 09/24/2015   PSA 0.80 02/08/2013   HGBA1C 12.5* 09/24/2015    Dg Eye Foreign Body  05/05/2012  *RADIOLOGY REPORT* Clinical Data: 60 year old male with history of metal exposure to the orbits, planned MRI. ORBITS FOR FOREIGN BODY - 2 VIEW Comparison: None. Findings: There is no evidence of metallic foreign body within the orbits.  No significant bone abnormality identified. . IMPRESSION: No evidence of metallic foreign body within the orbits. Original Report Authenticated By: Roselyn Reef, M.D.   Mr Hand Right W Wo Contrast  05/05/2012  *RADIOLOGY REPORT* Clinical Data: Right thumb and thenar eminence pain.  Burning sensation. BUN and creatinine were obtained on site at [facility] at [address] Results:  BUN 9 mg/dL,  Creatinine 0.9 mg/dL. MRI OF THE RIGHT HAND WITHOUT AND WITH CONTRAST Technique:  Multiplanar, multisequence MR imaging was performed both before and after administration of intravenous contrast. Contrast: 47mL MULTIHANCE GADOBENATE DIMEGLUMINE 529 MG/ML IV SOLN Comparison: None. Findings: There is no edema in the  muscles of the thenar eminence. The median nerve is of normal signal intensity within the carpal tunnel.  It does have a more anterior to posterior orientation within the carpal tunnel than usual but this can be seen as a normal variant.  There is no evidence of a neuroma or other abnormality of the branches of the median nerve to the thenar eminence. There is what appears to be a small metallic artifact in the soft tissues of the anterior aspect of the thumb at the level of the shaft of the proximal phalangeal bone.  Has the patient had any injections are soft tissue injuries at that site.  The underlying flexor tendon appears normal.  There is minimal edema in the base of the proximal phalangeal j bone of the thumb consistent with slight arthritis. After contrast administration there is no pathologic enhancement. IMPRESSION: 1.  No evidence of a lesion of the median nerve or its branches in the hand. 2.  No edema of the muscles of the thenar eminence. 3.  Small probable metallic artifact in the soft tissues of the proximal thumb. 4.  Minimal arthritic changes of the first metacarpal phalangeal joint. Original Report Authenticated By: Lorriane Shire, M.D.    Assessment & Plan:   There are no diagnoses linked to this encounter. I am having Mr. Langer maintain his aspirin, cholecalciferol, ibuprofen, loratadine, traMADol, pantoprazole, valsartan, rosuvastatin, Saxagliptin-Metformin, spironolactone, promethazine-codeine, and carvedilol.  No orders of the defined types were placed in this encounter.     Follow-up: No Follow-up on file.  [name], MD

## 2015-10-30 ENCOUNTER — Ambulatory Visit (INDEPENDENT_AMBULATORY_CARE_PROVIDER_SITE_OTHER): Payer: 59 | Admitting: Endocrinology

## 2015-10-30 ENCOUNTER — Encounter: Payer: Self-pay | Admitting: Endocrinology

## 2015-10-30 VITALS — BP 124/78 | HR 66 | Temp 98.0°F | Ht 72.0 in | Wt 227.0 lb

## 2015-10-30 DIAGNOSIS — IMO0002 Reserved for concepts with insufficient information to code with codable children: Secondary | ICD-10-CM

## 2015-10-30 DIAGNOSIS — E1165 Type 2 diabetes mellitus with hyperglycemia: Secondary | ICD-10-CM

## 2015-10-30 DIAGNOSIS — E1159 Type 2 diabetes mellitus with other circulatory complications: Secondary | ICD-10-CM | POA: Diagnosis not present

## 2015-10-30 MED ORDER — BROMOCRIPTINE MESYLATE 2.5 MG PO TABS: ORAL_TABLET | ORAL | Status: DC

## 2015-10-30 NOTE — Progress Notes (Signed)
Subjective:    Patient ID: [name], male    DOB: [birth year], 60 y.o.   MRN: [id number]  HPI pt states DM was dx'ed in early 2017; he has mild if any neuropathy of the lower extremities, but he has associated CVA and CAD; he has never been on insulin; pt says his diet is good, but exercise is improved by OA; he has never had pancreatitis, severe hypoglycemia or DKA. He was rx'ed 3 oral meds.  Meter is downloaded today, and the printout is scanned into the record: since on the meds, cbg's vary from 107-170.   Past Medical History  Diagnosis Date  . Hypertension   . Peripheral vascular disease ([place])     Dr. Erskin Burnet  . TIA (transient ischemic attack)     by history  . Aortic valve sclerosis     aortic valve sclerosis  . CAD (coronary artery disease)     nuclear 2005  /    . 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 Ejection fraction < 50%     EF 40%, echo 2006, no ICD needed  / EF 40%, echo, 11/2009  . Hyperlipidemia     Low HDL  . Former cigarette smoker   . Volume overload   . Carotid artery disease ([place])     Doppler, July, 2011, stable, chronic total occlusion LCCA, retrograde flow from LECA to LICA, ,, [id number],,, [id number] RICA  . Sleep disorder     Dr Gwenette Greet, lose weight and consider sleep study later  . Mitral regurgitation     mild, echo, 11/2009  . Dizziness     Past Surgical History  Procedure Laterality Date  . Coronary artery bypass graft    . Colonoscopy  03/19/2009    Social History   Social History  . Marital Status: Married    Spouse Name: N/A  . Number of Children: N/A  . Years of Education: N/A   Occupational History  . Not on file.   Social History Main Topics  . Smoking status: Former Smoker    Quit date: 05/05/2002  . Smokeless tobacco: Not on file  . Alcohol Use: No  . Drug Use: No  . Sexual Activity: Yes   Other Topics Concern  . Not on file   Social History Narrative   No Regular exercise    Current Outpatient Prescriptions on File  Prior to Visit  Medication Sig Dispense Refill  . aspirin 81 MG EC tablet Take 81 mg by mouth daily.      . canagliflozin (INVOKANA) 100 MG TABS tablet Take 1 tablet (100 mg total) by mouth daily. 30 tablet 11  . carvedilol (COREG) 25 MG tablet TAKE 1 TABLET (25 MG TOTAL) BY MOUTH 2 (TWO) TIMES DAILY WITH A MEAL. 180 tablet 1  . Cholecalciferol (VITAMIN D3) 1000 UNITS tablet Take 1,000 Units by mouth daily.      Marland Kitchen glucose blood (ONETOUCH VERIO) test strip Use as instructed 50 each 11  . ONETOUCH DELICA LANCETS FINE MISC 1 Device by Does not apply route daily as needed. 100 each 3  . pantoprazole (PROTONIX) 40 MG tablet TAKE 1 TABLET (40 MG TOTAL) BY MOUTH DAILY. 90 tablet 2  . rosuvastatin (CRESTOR) 40 MG tablet Take 1 tablet (40 mg total) by mouth daily. 90 tablet 3  . Saxagliptin-Metformin (KOMBIGLYZE XR) 11-998 MG TB24 Take 1 tablet by mouth daily before breakfast. 90 tablet 3  . spironolactone (ALDACTONE) 25 MG  tablet Take 0.5 tablets (12.5 mg total) by mouth daily. 15 tablet 11  . valsartan (DIOVAN) 160 MG tablet Take 1 tablet (160 mg total) by mouth daily. 90 tablet 2  . ibuprofen (ADVIL,MOTRIN) 600 MG tablet Take 1 tablet (600 mg total) by mouth as needed. (Patient not taking: Reported on 10/30/2015) 100 tablet 3  . loratadine (CLARITIN) 10 MG tablet Take 1 tablet (10 mg total) by mouth daily. (Patient not taking: Reported on 10/30/2015) 100 tablet 3  . traMADol (ULTRAM) 50 MG tablet Take 1-2 tablets (50-100 mg total) by mouth 2 (two) times daily as needed. (Patient not taking: Reported on 10/30/2015) 100 tablet 1   No current facility-administered medications on file prior to visit.    No Known Allergies  Family History  Problem Relation Age of Onset  . Coronary artery disease      Male 1st degree relative <50  . Diabetes      1st degree relative  . Stroke      Male 1st degree relative<50  . Stroke Mother   . Diabetes Father   . COPD Father   . Arthritis Father   . Heart  disease Father     BP 124/78 mmHg  Pulse 66  Temp(Src) 98 F (36.7 C) (Oral)  Ht 6' (1.829 m)  Wt 227 lb (102.967 kg)  BMI 30.78 kg/m2  SpO2 95%  Review of Systems denies headache, chest pain, sob, n/v, excessive diaphoresis, depression, cold intolerance, rhinorrhea, and easy bruising.  He has slight blurry vision.  He has lost 25 lbs. He has urinary frequency and leg cramps.      Objective:   Physical Exam VS: see vs page GEN: no distress HEAD: head: no deformity eyes: no periorbital swelling, no proptosis external nose and ears are normal mouth: no lesion seen NECK: supple, thyroid is not enlarged CHEST WALL: no deformity.  Old healed surgical scar (median sternotomy).   LUNGS: clear to auscultation CV: reg rate and rhythm, no murmur ABD: abdomen is soft, nontender.  no hepatosplenomegaly.  not distended.  no hernia.   MUSCULOSKELETAL: muscle bulk and strength are grossly normal.  no obvious joint swelling.  gait is normal and steady EXTEMITIES: no deformity.  no ulcer on the feet.  feet are of normal color and temp.  Trace bilat leg edema.  Old healed surgical scar on the right leg (vein harvest).   PULSES: dorsalis pedis intact bilat.  no carotid bruit.   NEURO:  cn 2-12 grossly intact.   readily moves all 4's.  sensation is intact to touch on the feet.  SKIN:  Normal texture and temperature.  No rash or suspicious lesion is visible.  NODES:  None palpable at the neck PSYCH: alert, well-oriented.  Does not appear anxious nor depressed.   Lab Results  Component Value Date   HGBA1C 12.5* 09/24/2015   I have reviewed outside records, and summarized: Pt was noted to have severely elevated a1c, and referred here.  i personally reviewed electrocardiogram tracing (05/09/15): Indication: CAD Impression: old IMI    Assessment & Plan:  Type 2 DM: severe exacerbation.  Edema: this precludes pioglitizone rx.   Patient is advised the following: Patient Instructions  good  diet and exercise significantly improve the control of your diabetes.  please let me know if you wish to be referred to a dietician.  high blood sugar is very risky to your health.  you should see an eye doctor and dentist every year.  It  is very important to get all recommended vaccinations.  controlling your blood pressure and cholesterol drastically reduces the damage diabetes does to your body.  Those who smoke should quit.  please discuss these with your doctor.  check your blood sugar once a day.  vary the time of day when you check, between before the 3 meals, and at bedtime.  also check if you have symptoms of your blood sugar being too high or too low.  please keep a record of the readings and bring it to your next appointment here (or you can bring the meter itself).  You can write it on any piece of paper.  please call us sooner if your blood sugar goes below 70, or if you have a lot of readings over 200.   i have sent a prescription to your pharmacy, to add "bromocriptine."  It has possible side effects of nausea and dizziness.  These go away with time.  You can avoid these by taking it at bedtime.   Please come back for a follow-up appointment in 1 month. Please see Vaughan Basta the same day, to learn about insulin, just in case.

## 2015-10-30 NOTE — Patient Instructions (Addendum)
good diet and exercise significantly improve the control of your diabetes.  please let me know if you wish to be referred to a dietician.  high blood sugar is very risky to your health.  you should see an eye doctor and dentist every year.  It is very important to get all recommended vaccinations.  controlling your blood pressure and cholesterol drastically reduces the damage diabetes does to your body.  Those who smoke should quit.  please discuss these with your doctor.  check your blood sugar once a day.  vary the time of day when you check, between before the 3 meals, and at bedtime.  also check if you have symptoms of your blood sugar being too high or too low.  please keep a record of the readings and bring it to your next appointment here (or you can bring the meter itself).  You can write it on any piece of paper.  please call us sooner if your blood sugar goes below 70, or if you have a lot of readings over 200.   i have sent a prescription to your pharmacy, to add "bromocriptine."  It has possible side effects of nausea and dizziness.  These go away with time.  You can avoid these by taking it at bedtime.   Please come back for a follow-up appointment in 1 month. Please see Vaughan Basta the same day, to learn about insulin, just in case.

## 2015-11-04 ENCOUNTER — Telehealth: Payer: Self-pay

## 2015-11-04 NOTE — Telephone Encounter (Signed)
PA initiated and APPROVED ([id number]) via CoverMyMeds key [id number]

## 2015-11-23 ENCOUNTER — Other Ambulatory Visit: Payer: Self-pay | Admitting: Internal Medicine

## 2015-12-02 ENCOUNTER — Encounter: Payer: 59 | Admitting: Nutrition

## 2015-12-02 ENCOUNTER — Ambulatory Visit: Payer: 59 | Admitting: Endocrinology

## 2015-12-10 ENCOUNTER — Encounter: Payer: Self-pay | Admitting: Endocrinology

## 2015-12-10 ENCOUNTER — Ambulatory Visit (INDEPENDENT_AMBULATORY_CARE_PROVIDER_SITE_OTHER): Payer: 59 | Admitting: Endocrinology

## 2015-12-10 ENCOUNTER — Encounter: Payer: 59 | Attending: Endocrinology | Admitting: Nutrition

## 2015-12-10 VITALS — BP 110/60 | HR 60 | Temp 98.1°F | Ht 72.0 in | Wt 221.0 lb

## 2015-12-10 DIAGNOSIS — E1159 Type 2 diabetes mellitus with other circulatory complications: Secondary | ICD-10-CM | POA: Insufficient documentation

## 2015-12-10 DIAGNOSIS — E1165 Type 2 diabetes mellitus with hyperglycemia: Secondary | ICD-10-CM

## 2015-12-10 DIAGNOSIS — IMO0002 Reserved for concepts with insufficient information to code with codable children: Secondary | ICD-10-CM

## 2015-12-10 LAB — POCT GLYCOSYLATED HEMOGLOBIN (HGB A1C): Hemoglobin A1C: 7.5

## 2015-12-10 MED ORDER — SAXAGLIPTIN-METFORMIN ER 5-1000 MG PO TB24: 2.0000 | ORAL_TABLET | Freq: Every day | ORAL | Status: DC

## 2015-12-10 NOTE — Patient Instructions (Addendum)
check your blood sugar once a day.  vary the time of day when you check, between before the 3 meals, and at bedtime.  also check if you have symptoms of your blood sugar being too high or too low.  please keep a record of the readings and bring it to your next appointment here (or you can bring the meter itself).  You can write it on any piece of paper.  please call us sooner if your blood sugar goes below 70, or if you have a lot of readings over 200.   Please increase the kombiglyze to 2 pills per day.  i have sent a prescription to your pharmacy.   Please come back for a follow-up appointment in 3 months.

## 2015-12-10 NOTE — Progress Notes (Signed)
Subjective:    Patient ID: [name], male    DOB: Dec 05, 1955, 60 y.o.   MRN: [id number]  HPI Pt returns for f/u of diabetes mellitus: DM type: 2 Dx'ed: 0000000 Complications: CVA and CAD Therapy: 4 oral meds.  DKA: never Severe hypoglycemia: never Pancreatitis: never Other: he has never been on insulin Interval history: pt states he feels well in general.  He has lost weight, due to his efforts.   Past Medical History  Diagnosis Date  . Hypertension   . Peripheral vascular disease ([place])     Dr. Erskin Burnet  . TIA (transient ischemic attack)     by history  . Aortic valve sclerosis     aortic valve sclerosis  . CAD (coronary artery disease)     nuclear 2005  /    . 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 Ejection fraction < 50%     EF 40%, echo 2006, no ICD needed  / EF 40%, echo, 11/2009  . Hyperlipidemia     Low HDL  . Former cigarette smoker   . Volume overload   . Carotid artery disease ([place])     Doppler, July, 2011, stable, chronic total occlusion LCCA, retrograde flow from LECA to LICA, ,, [id number],,, [id number] RICA  . Sleep disorder     Dr Gwenette Greet, lose weight and consider sleep study later  . Mitral regurgitation     mild, echo, 11/2009  . Dizziness     Past Surgical History  Procedure Laterality Date  . Coronary artery bypass graft    . Colonoscopy  03/19/2009    Social History   Social History  . Marital Status: Married    Spouse Name: N/A  . Number of Children: N/A  . Years of Education: N/A   Occupational History  . Not on file.   Social History Main Topics  . Smoking status: Former Smoker    Quit date: 05/05/2002  . Smokeless tobacco: Not on file  . Alcohol Use: No  . Drug Use: No  . Sexual Activity: Yes   Other Topics Concern  . Not on file   Social History Narrative   No Regular exercise    Current Outpatient Prescriptions on File Prior to Visit  Medication Sig Dispense Refill  . aspirin 81 MG EC tablet Take 81 mg by mouth daily.        . bromocriptine (PARLODEL) 2.5 MG tablet 1/4 tab daily 10 tablet 11  . canagliflozin (INVOKANA) 100 MG TABS tablet Take 1 tablet (100 mg total) by mouth daily. 30 tablet 11  . carvedilol (COREG) 25 MG tablet TAKE 1 TABLET (25 MG TOTAL) BY MOUTH 2 (TWO) TIMES DAILY WITH A MEAL. 180 tablet 1  . Cholecalciferol (VITAMIN D3) 1000 UNITS tablet Take 1,000 Units by mouth daily.      Marland Kitchen glucose blood (ONETOUCH VERIO) test strip Use as instructed 50 each 11  . ibuprofen (ADVIL,MOTRIN) 600 MG tablet Take 1 tablet (600 mg total) by mouth as needed. 100 tablet 3  . loratadine (CLARITIN) 10 MG tablet Take 1 tablet (10 mg total) by mouth daily. 100 tablet 3  . ONETOUCH DELICA LANCETS FINE MISC 1 Device by Does not apply route daily as needed. 100 each 3  . pantoprazole (PROTONIX) 40 MG tablet TAKE 1 TABLET (40 MG TOTAL) BY MOUTH DAILY. 90 tablet 2  . rosuvastatin (CRESTOR) 40 MG tablet Take 1 tablet (40 mg total) by mouth daily. [place]  tablet 3  . spironolactone (ALDACTONE) 25 MG tablet Take 0.5 tablets (12.5 mg total) by mouth daily. 15 tablet 11  . traMADol (ULTRAM) 50 MG tablet Take 1-2 tablets (50-100 mg total) by mouth 2 (two) times daily as needed. 100 tablet 1  . valsartan (DIOVAN) 160 MG tablet Take 1 tablet (160 mg total) by mouth daily. 90 tablet 2   No current facility-administered medications on file prior to visit.    No Known Allergies  Family History  Problem Relation Age of Onset  . Coronary artery disease      Male 1st degree relative <50  . Diabetes      1st degree relative  . Stroke      Male 1st degree relative<50  . Stroke Mother   . Diabetes Father   . COPD Father   . Arthritis Father   . Heart disease Father     BP 110/60 mmHg  Pulse 60  Temp(Src) 98.1 F (36.7 C) (Oral)  Ht 6' (1.829 m)  Wt 221 lb (100.245 kg)  BMI 29.97 kg/m2  SpO2 94%  Review of Systems He denies hypoglycemia    Objective:   Physical Exam VITAL SIGNS:  See vs page GENERAL: no  distress Pulses: dorsalis pedis intact bilat.   MSK: no deformity of the feet CV: trace bilat leg edema Skin:  no ulcer on the feet.  normal color and temp on the feet.  Old healed surgical scar on the right leg (vein harvest). Neuro: sensation is intact to touch on the feet.   A1c=7.5% Lab Results  Component Value Date   CREATININE 1.05 09/24/2015   BUN 14 09/24/2015   NA 132* 09/24/2015   K 4.5 09/24/2015   CL 95* 09/24/2015   CO2 29 09/24/2015      Assessment & Plan:  Type 2 DM: he needs increased rx.   Patient is advised the following: Patient Instructions  check your blood sugar once a day.  vary the time of day when you check, between before the 3 meals, and at bedtime.  also check if you have symptoms of your blood sugar being too high or too low.  please keep a record of the readings and bring it to your next appointment here (or you can bring the meter itself).  You can write it on any piece of paper.  please call us sooner if your blood sugar goes below 70, or if you have a lot of readings over 200.   Please increase the kombiglyze to 2 pills per day.  i have sent a prescription to your pharmacy.   Please come back for a follow-up appointment in 3 months.   Renato Shin, MD

## 2015-12-10 NOTE — Progress Notes (Signed)
Meter downloaded and blood sugars all below 130 ac and pc meals.  Pt. Reports watching portion sizes, exercising, and has lost 30 pounds in last 2 months.   Meals are mostly balanced with about 30-45g of carbs.  He has taken "all of the sugar out of his diet".  Meat portions have been reduced to 4-5 ounces at supper, 2-3 at lunch and 1-2 at breakfast.   He was shown how to use the insulin pen and we discussed low blood sugar symptoms and treatments.  He had no final questions.

## 2015-12-10 NOTE — Patient Instructions (Signed)
Continue to follow diet and exercise routine. Test blood sugars before meals or 2 hours after meals.

## 2016-01-09 ENCOUNTER — Ambulatory Visit: Payer: 59 | Admitting: Internal Medicine

## 2016-01-16 ENCOUNTER — Ambulatory Visit (INDEPENDENT_AMBULATORY_CARE_PROVIDER_SITE_OTHER): Payer: 59 | Admitting: Internal Medicine

## 2016-01-16 ENCOUNTER — Encounter: Payer: Self-pay | Admitting: Internal Medicine

## 2016-01-16 ENCOUNTER — Other Ambulatory Visit (INDEPENDENT_AMBULATORY_CARE_PROVIDER_SITE_OTHER): Payer: 59

## 2016-01-16 VITALS — BP 118/70 | HR 60 | Wt 219.0 lb

## 2016-01-16 DIAGNOSIS — G458 Other transient cerebral ischemic attacks and related syndromes: Secondary | ICD-10-CM | POA: Diagnosis not present

## 2016-01-16 DIAGNOSIS — I2583 Coronary atherosclerosis due to lipid rich plaque: Principal | ICD-10-CM

## 2016-01-16 DIAGNOSIS — I2510 Atherosclerotic heart disease of native coronary artery without angina pectoris: Secondary | ICD-10-CM | POA: Diagnosis not present

## 2016-01-16 DIAGNOSIS — E1165 Type 2 diabetes mellitus with hyperglycemia: Secondary | ICD-10-CM

## 2016-01-16 DIAGNOSIS — I10 Essential (primary) hypertension: Secondary | ICD-10-CM | POA: Diagnosis not present

## 2016-01-16 DIAGNOSIS — IMO0002 Reserved for concepts with insufficient information to code with codable children: Secondary | ICD-10-CM

## 2016-01-16 DIAGNOSIS — E1159 Type 2 diabetes mellitus with other circulatory complications: Secondary | ICD-10-CM | POA: Diagnosis not present

## 2016-01-16 DIAGNOSIS — E119 Type 2 diabetes mellitus without complications: Secondary | ICD-10-CM | POA: Diagnosis not present

## 2016-01-16 LAB — LIPID PANEL
CHOL/HDL RATIO: 3
Cholesterol: 104 mg/dL (ref 0–200)
HDL: 34.3 mg/dL — ABNORMAL LOW (ref >39.00)
LDL Cholesterol: 53 mg/dL (ref 0–99)
NONHDL: 69.86
Triglycerides: 84 mg/dL (ref 0.0–149.0)
VLDL: 16.8 mg/dL (ref 0.0–40.0)

## 2016-01-16 LAB — BASIC METABOLIC PANEL
BUN: 12 mg/dL (ref 6–23)
CALCIUM: 9.6 mg/dL (ref 8.4–10.5)
CO2: 23 mEq/L (ref 19–32)
CREATININE: 0.88 mg/dL (ref 0.40–1.50)
Chloride: 104 mEq/L (ref 96–112)
GFR: 93.44 mL/min (ref >60.00)
GLUCOSE: 120 mg/dL — AB (ref 70–99)
Potassium: 4.2 mEq/L (ref 3.5–5.1)
Sodium: 139 mEq/L (ref 135–145)

## 2016-01-16 LAB — HEMOGLOBIN A1C: Hgb A1c MFr Bld: 6.7 % — ABNORMAL HIGH (ref 4.6–6.5)

## 2016-01-16 MED ORDER — CANAGLIFLOZIN 100 MG PO TABS: 100.0000 mg | ORAL_TABLET | Freq: Every day | ORAL | Status: DC

## 2016-01-16 MED ORDER — VALSARTAN 160 MG PO TABS: 160.0000 mg | ORAL_TABLET | Freq: Every day | ORAL | Status: DC

## 2016-01-16 MED ORDER — BROMOCRIPTINE MESYLATE 2.5 MG PO TABS: ORAL_TABLET | ORAL | Status: DC

## 2016-01-16 MED ORDER — TRAMADOL HCL 50 MG PO TABS: 50.0000 mg | ORAL_TABLET | Freq: Two times a day (BID) | ORAL | Status: DC | PRN

## 2016-01-16 NOTE — Assessment & Plan Note (Signed)
Coreg , Diovan, Aldactone, ASA, Crestor

## 2016-01-16 NOTE — Assessment & Plan Note (Signed)
Coreg , Diovan, Aldactone 

## 2016-01-16 NOTE — Assessment & Plan Note (Signed)
ASA Coreg , Diovan, Aldactone

## 2016-01-16 NOTE — Progress Notes (Signed)
Pre visit review using our clinic review tool, if applicable. No additional management support is needed unless otherwise documented below in the visit note. 

## 2016-01-16 NOTE — Assessment & Plan Note (Signed)
Kombiglyze  Invokana Bromocryptine   

## 2016-01-16 NOTE — Progress Notes (Signed)
Subjective:  Patient ID: [name], male    DOB: 1955/11/14  Age: 60 y.o. MRN: [id number]  CC: No chief complaint on file.   HPI MANDIP FRAME presents for DM, CAD, HTN,   Outpatient Prescriptions Prior to Visit  Medication Sig Dispense Refill  . aspirin 81 MG EC tablet Take 81 mg by mouth daily.      . bromocriptine (PARLODEL) 2.5 MG tablet 1/4 tab daily 10 tablet 11  . canagliflozin (INVOKANA) 100 MG TABS tablet Take 1 tablet (100 mg total) by mouth daily. 30 tablet 11  . carvedilol (COREG) 25 MG tablet TAKE 1 TABLET (25 MG TOTAL) BY MOUTH 2 (TWO) TIMES DAILY WITH A MEAL. 180 tablet 1  . Cholecalciferol (VITAMIN D3) 1000 UNITS tablet Take 1,000 Units by mouth daily.      Marland Kitchen glucose blood (ONETOUCH VERIO) test strip Use as instructed 50 each 11  . ibuprofen (ADVIL,MOTRIN) 600 MG tablet Take 1 tablet (600 mg total) by mouth as needed. 100 tablet 3  . loratadine (CLARITIN) 10 MG tablet Take 1 tablet (10 mg total) by mouth daily. 100 tablet 3  . ONETOUCH DELICA LANCETS FINE MISC 1 Device by Does not apply route daily as needed. 100 each 3  . pantoprazole (PROTONIX) 40 MG tablet TAKE 1 TABLET (40 MG TOTAL) BY MOUTH DAILY. 90 tablet 2  . rosuvastatin (CRESTOR) 40 MG tablet Take 1 tablet (40 mg total) by mouth daily. 90 tablet 3  . Saxagliptin-Metformin (KOMBIGLYZE XR) 11-998 MG TB24 Take 2 tablets by mouth daily before breakfast. 180 tablet 3  . spironolactone (ALDACTONE) 25 MG tablet Take 0.5 tablets (12.5 mg total) by mouth daily. 15 tablet 11  . traMADol (ULTRAM) 50 MG tablet Take 1-2 tablets (50-100 mg total) by mouth 2 (two) times daily as needed. 100 tablet 1  . valsartan (DIOVAN) 160 MG tablet Take 1 tablet (160 mg total) by mouth daily. 90 tablet 2   No facility-administered medications prior to visit.    ROS Review of Systems  Constitutional: Negative for appetite change, fatigue and unexpected weight change.  HENT: Negative for congestion, nosebleeds, sneezing, sore  throat and trouble swallowing.   Eyes: Negative for itching and visual disturbance.  Respiratory: Negative for cough.   Cardiovascular: Negative for chest pain, palpitations and leg swelling.  Gastrointestinal: Negative for nausea, diarrhea, blood in stool and abdominal distention.  Genitourinary: Negative for frequency and hematuria.  Musculoskeletal: Negative for back pain, joint swelling, gait problem and neck pain.  Skin: Negative for rash.  Neurological: Negative for dizziness, tremors, speech difficulty and weakness.  Psychiatric/Behavioral: Negative for suicidal ideas, sleep disturbance, dysphoric mood and agitation. The patient is not nervous/anxious.     Objective:  BP 118/70 mmHg  Pulse 60  Wt 219 lb (99.338 kg)  SpO2 94%  BP Readings from Last 3 Encounters:  01/16/16 118/70  12/10/15 110/60  10/30/15 124/78    Wt Readings from Last 3 Encounters:  01/16/16 219 lb (99.338 kg)  12/10/15 221 lb (100.245 kg)  10/30/15 227 lb (102.967 kg)    Physical Exam  Constitutional: He is oriented to person, place, and time. He appears well-developed. No distress.  NAD  HENT:  Mouth/Throat: Oropharynx is clear and moist.  Eyes: Conjunctivae are normal. Pupils are equal, round, and reactive to light.  Neck: Normal range of motion. No JVD present. No thyromegaly present.  Cardiovascular: Normal rate, regular rhythm, normal heart sounds and intact distal pulses.  Exam reveals no  gallop and no friction rub.   No murmur heard. Pulmonary/Chest: Effort normal and breath sounds normal. No respiratory distress. He has no wheezes. He has no rales. He exhibits no tenderness.  Abdominal: Soft. Bowel sounds are normal. He exhibits no distension and no mass. There is no tenderness. There is no rebound and no guarding.  Musculoskeletal: Normal range of motion. He exhibits no edema or tenderness.  Lymphadenopathy:    He has no cervical adenopathy.  Neurological: He is alert and oriented to  person, place, and time. He has normal reflexes. No cranial nerve deficit. He exhibits normal muscle tone. He displays a negative Romberg sign. Coordination and gait normal.  Skin: Skin is warm and dry. No rash noted.  Psychiatric: He has a normal mood and affect. His behavior is normal. Judgment and thought content normal.    Lab Results  Component Value Date   WBC 10.1 05/04/2013   HGB 14.8 05/04/2013   HCT 43.5 05/04/2013   PLT 215.0 05/04/2013   GLUCOSE 120* 01/16/2016   CHOL 104 01/16/2016   TRIG 84.0 01/16/2016   HDL 34.30* 01/16/2016   LDLDIRECT 198.1 02/08/2013   LDLCALC 53 01/16/2016   ALT 22 09/24/2015   AST 17 09/24/2015   NA 139 01/16/2016   K 4.2 01/16/2016   CL 104 01/16/2016   CREATININE 0.88 01/16/2016   BUN 12 01/16/2016   CO2 23 01/16/2016   TSH 1.70 09/24/2015   PSA 0.80 02/08/2013   HGBA1C 6.7* 01/16/2016    Dg Eye Foreign Body  05/05/2012  *RADIOLOGY REPORT* Clinical Data: 60 year old male with history of metal exposure to the orbits, planned MRI. ORBITS FOR FOREIGN BODY - 2 VIEW Comparison: None. Findings: There is no evidence of metallic foreign body within the orbits.  No significant bone abnormality identified. . IMPRESSION: No evidence of metallic foreign body within the orbits. Original Report Authenticated By: Roselyn Reef, M.D.   Mr Hand Right W Wo Contrast  05/05/2012  *RADIOLOGY REPORT* Clinical Data: Right thumb and thenar eminence pain.  Burning sensation. BUN and creatinine were obtained on site at [facility] at [address] Results:  BUN 9 mg/dL,  Creatinine 0.9 mg/dL. MRI OF THE RIGHT HAND WITHOUT AND WITH CONTRAST Technique:  Multiplanar, multisequence MR imaging was performed both before and after administration of intravenous contrast. Contrast: 76mL MULTIHANCE GADOBENATE DIMEGLUMINE 529 MG/ML IV SOLN Comparison: None. Findings: There is no edema in the muscles of the thenar eminence. The median nerve is of normal signal  intensity within the carpal tunnel.  It does have a more anterior to posterior orientation within the carpal tunnel than usual but this can be seen as a normal variant.  There is no evidence of a neuroma or other abnormality of the branches of the median nerve to the thenar eminence. There is what appears to be a small metallic artifact in the soft tissues of the anterior aspect of the thumb at the level of the shaft of the proximal phalangeal bone.  Has the patient had any injections are soft tissue injuries at that site.  The underlying flexor tendon appears normal.  There is minimal edema in the base of the proximal phalangeal j bone of the thumb consistent with slight arthritis. After contrast administration there is no pathologic enhancement. IMPRESSION: 1.  No evidence of a lesion of the median nerve or its branches in the hand. 2.  No edema of the muscles of the thenar eminence. 3.  Small probable  metallic artifact in the soft tissues of the proximal thumb. 4.  Minimal arthritic changes of the first metacarpal phalangeal joint. Original Report Authenticated By: Lorriane Shire, M.D.    Assessment & Plan:   There are no diagnoses linked to this encounter. I am having Mr. Pignatelli maintain his aspirin, cholecalciferol, ibuprofen, loratadine, traMADol, valsartan, rosuvastatin, spironolactone, carvedilol, glucose blood, ONETOUCH DELICA LANCETS FINE, canagliflozin, bromocriptine, pantoprazole, and Saxagliptin-Metformin.  No orders of the defined types were placed in this encounter.     Follow-up: No Follow-up on file.  [name], MD

## 2016-03-12 ENCOUNTER — Ambulatory Visit: Payer: 59 | Admitting: Endocrinology

## 2016-03-19 ENCOUNTER — Encounter: Payer: Self-pay | Admitting: Endocrinology

## 2016-03-19 ENCOUNTER — Ambulatory Visit (INDEPENDENT_AMBULATORY_CARE_PROVIDER_SITE_OTHER): Payer: 59 | Admitting: Endocrinology

## 2016-03-19 VITALS — BP 126/86 | HR 65 | Ht 72.0 in | Wt 220.0 lb

## 2016-03-19 DIAGNOSIS — IMO0002 Reserved for concepts with insufficient information to code with codable children: Secondary | ICD-10-CM

## 2016-03-19 DIAGNOSIS — E1159 Type 2 diabetes mellitus with other circulatory complications: Secondary | ICD-10-CM | POA: Diagnosis not present

## 2016-03-19 DIAGNOSIS — E1165 Type 2 diabetes mellitus with hyperglycemia: Secondary | ICD-10-CM | POA: Diagnosis not present

## 2016-03-19 LAB — POCT GLYCOSYLATED HEMOGLOBIN (HGB A1C): Hemoglobin A1C: 6.4

## 2016-03-19 NOTE — Progress Notes (Signed)
Subjective:     Patient ID: [name], male   DOB: 12/20/1955, 60 y.o.   MRN: [id number]  HPI   Review of Systems     Objective:   Physical Exam     Assessment:         Plan:

## 2016-03-19 NOTE — Progress Notes (Signed)
Subjective:    Patient ID: [name], male    DOB: 1955/12/13, 60 y.o.   MRN: [id number]  HPI Pt returns for f/u of diabetes mellitus: DM type: 2 Dx'ed: 0000000 Complications: CVA and CAD Therapy: 4 oral meds.  DKA: never Severe hypoglycemia: never Pancreatitis: never Other: he has never been on insulin Interval history: pt states he feels well in general.  He says cbg's are well-controlled.   Past Medical History:  Diagnosis Date  . Aortic valve sclerosis    aortic valve sclerosis  . CAD (coronary artery disease)    nuclear 2005  /    . Carotid artery disease ([place])    Doppler, July, 2011, stable, chronic total occlusion LCCA, retrograde flow from LECA to LICA, ,, [id number],,, [id number] RICA  . Dizziness   . Ejection fraction < 50%    EF 40%, echo 2006, no ICD needed  / EF 40%, echo, 11/2009  . Former cigarette smoker   . 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 Hyperlipidemia    Low HDL  . Hypertension   . Mitral regurgitation    mild, echo, 11/2009  . Peripheral vascular disease ([place])    Dr. Erskin Burnet  . Sleep disorder    Dr Gwenette Greet, lose weight and consider sleep study later  . TIA (transient ischemic attack)    by history  . Volume overload     Past Surgical History:  Procedure Laterality Date  . COLONOSCOPY  03/19/2009  . CORONARY ARTERY BYPASS GRAFT      Social History   Social History  . Marital status: Married    Spouse name: N/A  . Number of children: N/A  . Years of education: N/A   Occupational History  . Not on file.   Social History Main Topics  . Smoking status: Former Smoker    Quit date: 05/05/2002  . Smokeless tobacco: Not on file  . Alcohol use No  . Drug use: No  . Sexual activity: Yes   Other Topics Concern  . Not on file   Social History Narrative   No Regular exercise    Current Outpatient Prescriptions on File Prior to Visit  Medication Sig Dispense Refill  . aspirin 81 MG EC tablet Take 81 mg by mouth daily.      . bromocriptine  (PARLODEL) 2.5 MG tablet 1/2 tab daily 45 tablet 3  . canagliflozin (INVOKANA) 100 MG TABS tablet Take 1 tablet (100 mg total) by mouth daily. 90 tablet 3  . carvedilol (COREG) 25 MG tablet TAKE 1 TABLET (25 MG TOTAL) BY MOUTH 2 (TWO) TIMES DAILY WITH A MEAL. 180 tablet 1  . Cholecalciferol (VITAMIN D3) 1000 UNITS tablet Take 1,000 Units by mouth daily.      Marland Kitchen glucose blood (ONETOUCH VERIO) test strip Use as instructed 50 each 11  . ibuprofen (ADVIL,MOTRIN) 600 MG tablet Take 1 tablet (600 mg total) by mouth as needed. 100 tablet 3  . loratadine (CLARITIN) 10 MG tablet Take 1 tablet (10 mg total) by mouth daily. 100 tablet 3  . ONETOUCH DELICA LANCETS FINE MISC 1 Device by Does not apply route daily as needed. 100 each 3  . pantoprazole (PROTONIX) 40 MG tablet TAKE 1 TABLET (40 MG TOTAL) BY MOUTH DAILY. 90 tablet 2  . rosuvastatin (CRESTOR) 40 MG tablet Take 1 tablet (40 mg total) by mouth daily. 90 tablet 3  . Saxagliptin-Metformin (KOMBIGLYZE XR) 11-998 MG TB24 Take 2 tablets by  mouth daily before breakfast. 180 tablet 3  . spironolactone (ALDACTONE) 25 MG tablet Take 0.5 tablets (12.5 mg total) by mouth daily. 15 tablet 11  . traMADol (ULTRAM) 50 MG tablet Take 1-2 tablets (50-100 mg total) by mouth 2 (two) times daily as needed. 100 tablet 1  . valsartan (DIOVAN) 160 MG tablet Take 1 tablet (160 mg total) by mouth daily. 90 tablet 3   No current facility-administered medications on file prior to visit.     No Known Allergies  Family History  Problem Relation Age of Onset  . Coronary artery disease      Male 1st degree relative <50  . Diabetes      1st degree relative  . Stroke      Male 1st degree relative<50  . Stroke Mother   . Diabetes Father   . COPD Father   . Arthritis Father   . Heart disease Father     BP 126/86   Pulse 65   Ht 6' (1.829 m)   Wt 220 lb (99.8 kg)   SpO2 98%   BMI 29.84 kg/m   Review of Systems He denies hypoglycemia.  No weight change      Objective:   Physical Exam VITAL SIGNS:  See vs page GENERAL: no distress Pulses: dorsalis pedis intact bilat.   MSK: no deformity of the feet CV: no leg edema Skin:  no ulcer on the feet.  normal color and temp on the feet. Neuro: sensation is intact to touch on the feet Lab Results  Component Value Date   HGBA1C 6.4 03/19/2016      Assessment & Plan:  Type 2 DM: well-controlled.

## 2016-03-19 NOTE — Patient Instructions (Addendum)

## 2016-03-20 ENCOUNTER — Other Ambulatory Visit: Payer: Self-pay | Admitting: Internal Medicine

## 2016-04-13 ENCOUNTER — Other Ambulatory Visit: Payer: Self-pay | Admitting: Internal Medicine

## 2016-04-17 ENCOUNTER — Other Ambulatory Visit: Payer: Self-pay | Admitting: Internal Medicine

## 2016-05-14 ENCOUNTER — Other Ambulatory Visit: Payer: Self-pay | Admitting: Cardiovascular Disease

## 2016-06-13 ENCOUNTER — Other Ambulatory Visit: Payer: Self-pay | Admitting: Cardiovascular Disease

## 2016-07-19 NOTE — Progress Notes (Signed)
Subjective:    Patient ID: [name], male    DOB: [birth year], 61 y.o.   MRN: [id number]  HPI Pt returns for f/u of diabetes mellitus: DM type: 2 Dx'ed: 0000000 Complications: CVA and CAD Therapy: 4 oral meds.  DKA: never Severe hypoglycemia: never Pancreatitis: never Other: he has never been on insulin.   Interval history: Meter is downloaded today, and the printout is scanned into the record.  It varies from 85-185.  There is no trend throughout the day.  pt states he feels well in general.   Past Medical History:  Diagnosis Date  . Aortic valve sclerosis    aortic valve sclerosis  . CAD (coronary artery disease)    nuclear 2005  /    . Carotid artery disease ([place])    Doppler, July, 2011, stable, chronic total occlusion LCCA, retrograde flow from LECA to LICA, ,, [id number],,, [id number] RICA  . Dizziness   . Ejection fraction < 50%    EF 40%, echo 2006, no ICD needed  / EF 40%, echo, 11/2009  . Former cigarette smoker   . 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 Hyperlipidemia    Low HDL  . Hypertension   . Mitral regurgitation    mild, echo, 11/2009  . Peripheral vascular disease ([place])    Dr. Erskin Burnet  . Sleep disorder    Dr Gwenette Greet, lose weight and consider sleep study later  . TIA (transient ischemic attack)    by history  . Volume overload     Past Surgical History:  Procedure Laterality Date  . COLONOSCOPY  03/19/2009  . CORONARY ARTERY BYPASS GRAFT      Social History   Social History  . Marital status: Married    Spouse name: N/A  . Number of children: N/A  . Years of education: N/A   Occupational History  . Not on file.   Social History Main Topics  . Smoking status: Former Smoker    Quit date: 05/05/2002  . Smokeless tobacco: Not on file  . Alcohol use No  . Drug use: No  . Sexual activity: Yes   Other Topics Concern  . Not on file   Social History Narrative   No Regular exercise    Current Outpatient Prescriptions on File Prior to Visit    Medication Sig Dispense Refill  . aspirin 81 MG EC tablet Take 81 mg by mouth daily.      . canagliflozin (INVOKANA) 100 MG TABS tablet Take 1 tablet (100 mg total) by mouth daily. 90 tablet 3  . carvedilol (COREG) 25 MG tablet TAKE 1 TABLET (25 MG TOTAL) BY MOUTH 2 (TWO) TIMES DAILY WITH A MEAL. 180 tablet 1  . Cholecalciferol (VITAMIN D3) 1000 UNITS tablet Take 1,000 Units by mouth daily.      Marland Kitchen glucose blood (ONETOUCH VERIO) test strip Use as instructed 50 each 11  . ibuprofen (ADVIL,MOTRIN) 600 MG tablet Take 1 tablet (600 mg total) by mouth as needed. 100 tablet 3  . loratadine (CLARITIN) 10 MG tablet Take 1 tablet (10 mg total) by mouth daily. 100 tablet 3  . ONETOUCH DELICA LANCETS 99991111 MISC USE AS DIRECTED AS NEEDED 100 each 3  . pantoprazole (PROTONIX) 40 MG tablet TAKE 1 TABLET (40 MG TOTAL) BY MOUTH DAILY. 90 tablet 2  . rosuvastatin (CRESTOR) 40 MG tablet TAKE 1 TABLET EVERY DAY 90 tablet 3  . spironolactone (ALDACTONE) 25 MG tablet Take 0.5 tablets (12.5  mg total) by mouth daily. *Please call and schedule an appointment for further refills* 7 tablet 0  . traMADol (ULTRAM) 50 MG tablet Take 1-2 tablets (50-100 mg total) by mouth 2 (two) times daily as needed. 100 tablet 1  . valsartan (DIOVAN) 160 MG tablet Take 1 tablet (160 mg total) by mouth daily. 90 tablet 3   No current facility-administered medications on file prior to visit.     No Known Allergies  Family History  Problem Relation Age of Onset  . Coronary artery disease      Male 1st degree relative <50  . Diabetes      1st degree relative  . Stroke      Male 1st degree relative<50  . Stroke Mother   . Diabetes Father   . COPD Father   . Arthritis Father   . Heart disease Father     BP 126/78   Pulse 65   Ht 6' (1.829 m)   Wt 225 lb (102.1 kg)   SpO2 95%   BMI 30.52 kg/m   Review of Systems He denies hypoglycemia.      Objective:   Physical Exam VITAL SIGNS:  See vs page.  GENERAL: no distress.   Pulses: dorsalis pedis intact bilat.   MSK: no deformity of the feet.   CV: no leg edema.   Skin:  no ulcer on the feet.  normal color and temp on the feet. Neuro: sensation is intact to touch on the feet.   A1c=6.5%    Assessment & Plan:  Type 2 DM: well-controlled.  Only med change needed now is for ins reasons.   Patient is advised the following: Patient Instructions  check your blood sugar once a day.  vary the time of day when you check, between before the 3 meals, and at bedtime.  also check if you have symptoms of your blood sugar being too high or too low.  please keep a record of the readings and bring it to your next appointment here (or you can bring the meter itself).  You can write it on any piece of paper.  please call us sooner if your blood sugar goes below 70, or if you have a lot of readings over 200.   Please change the kombiglyze to "jentadueto."  I have sent a prescription to your pharmacy.   Please come back for a follow-up appointment in 4 months.

## 2016-07-20 ENCOUNTER — Ambulatory Visit (INDEPENDENT_AMBULATORY_CARE_PROVIDER_SITE_OTHER): Payer: 59 | Admitting: Endocrinology

## 2016-07-20 ENCOUNTER — Encounter: Payer: Self-pay | Admitting: Endocrinology

## 2016-07-20 VITALS — BP 126/78 | HR 65 | Ht 72.0 in | Wt 225.0 lb

## 2016-07-20 DIAGNOSIS — E1159 Type 2 diabetes mellitus with other circulatory complications: Secondary | ICD-10-CM

## 2016-07-20 DIAGNOSIS — E1165 Type 2 diabetes mellitus with hyperglycemia: Secondary | ICD-10-CM

## 2016-07-20 DIAGNOSIS — IMO0002 Reserved for concepts with insufficient information to code with codable children: Secondary | ICD-10-CM

## 2016-07-20 LAB — POCT GLYCOSYLATED HEMOGLOBIN (HGB A1C): HEMOGLOBIN A1C: 6.5

## 2016-07-20 MED ORDER — LINAGLIPTIN-METFORMIN HCL ER 2.5-1000 MG PO TB24: 2.0000 | ORAL_TABLET | Freq: Every day | ORAL | 11 refills | Status: DC

## 2016-07-20 MED ORDER — BROMOCRIPTINE MESYLATE 2.5 MG PO TABS: ORAL_TABLET | ORAL | 3 refills | Status: DC

## 2016-07-20 NOTE — Patient Instructions (Addendum)
check your blood sugar once a day.  vary the time of day when you check, between before the 3 meals, and at bedtime.  also check if you have symptoms of your blood sugar being too high or too low.  please keep a record of the readings and bring it to your next appointment here (or you can bring the meter itself).  You can write it on any piece of paper.  please call us sooner if your blood sugar goes below 70, or if you have a lot of readings over 200.   Please change the kombiglyze to "jentadueto."  I have sent a prescription to your pharmacy.   Please come back for a follow-up appointment in 4 months.

## 2016-07-23 ENCOUNTER — Encounter: Payer: Self-pay | Admitting: Internal Medicine

## 2016-07-23 ENCOUNTER — Ambulatory Visit: Payer: 59 | Admitting: Internal Medicine

## 2016-07-23 ENCOUNTER — Ambulatory Visit (INDEPENDENT_AMBULATORY_CARE_PROVIDER_SITE_OTHER): Payer: 59 | Admitting: Internal Medicine

## 2016-07-23 DIAGNOSIS — E1159 Type 2 diabetes mellitus with other circulatory complications: Secondary | ICD-10-CM

## 2016-07-23 DIAGNOSIS — I779 Disorder of arteries and arterioles, unspecified: Secondary | ICD-10-CM | POA: Diagnosis not present

## 2016-07-23 DIAGNOSIS — I739 Peripheral vascular disease, unspecified: Secondary | ICD-10-CM

## 2016-07-23 DIAGNOSIS — I5022 Chronic systolic (congestive) heart failure: Secondary | ICD-10-CM | POA: Diagnosis not present

## 2016-07-23 DIAGNOSIS — E1165 Type 2 diabetes mellitus with hyperglycemia: Secondary | ICD-10-CM

## 2016-07-23 DIAGNOSIS — IMO0002 Reserved for concepts with insufficient information to code with codable children: Secondary | ICD-10-CM

## 2016-07-23 DIAGNOSIS — G458 Other transient cerebral ischemic attacks and related syndromes: Secondary | ICD-10-CM | POA: Diagnosis not present

## 2016-07-23 NOTE — Progress Notes (Signed)
Pre visit review using our clinic review tool, if applicable. No additional management support is needed unless otherwise documented below in the visit note. 

## 2016-07-23 NOTE — Assessment & Plan Note (Signed)
Cored Diovan Aldactone

## 2016-07-23 NOTE — Assessment & Plan Note (Signed)
ASA Doppler US

## 2016-07-23 NOTE — Progress Notes (Signed)
Subjective:  Patient ID: [name], male    DOB: [birth year]  Age: 61 y.o. MRN: [id number]  CC: No chief complaint on file.   HPI [name] presents for CAD, OA, DM, HTN f/u  Outpatient Medications Prior to Visit  Medication Sig Dispense Refill  . aspirin 81 MG EC tablet Take 81 mg by mouth daily.      . bromocriptine (PARLODEL) 2.5 MG tablet 1/2 tab daily 45 tablet 3  . canagliflozin (INVOKANA) 100 MG TABS tablet Take 1 tablet (100 mg total) by mouth daily. 90 tablet 3  . carvedilol (COREG) 25 MG tablet TAKE 1 TABLET (25 MG TOTAL) BY MOUTH 2 (TWO) TIMES DAILY WITH A MEAL. 180 tablet 1  . glucose blood (ONETOUCH VERIO) test strip Use as instructed 50 each 11  . ibuprofen (ADVIL,MOTRIN) 600 MG tablet Take 1 tablet (600 mg total) by mouth as needed. 100 tablet 3  . Linagliptin-Metformin HCl ER (JENTADUETO XR) 2.11-998 MG TB24 Take 2 tablets by mouth daily. 60 tablet 11  . ONETOUCH DELICA LANCETS 99991111 MISC USE AS DIRECTED AS NEEDED 100 each 3  . pantoprazole (PROTONIX) 40 MG tablet TAKE 1 TABLET (40 MG TOTAL) BY MOUTH DAILY. 90 tablet 2  . rosuvastatin (CRESTOR) 40 MG tablet TAKE 1 TABLET EVERY DAY 90 tablet 3  . spironolactone (ALDACTONE) 25 MG tablet Take 0.5 tablets (12.5 mg total) by mouth daily. *Please call and schedule an appointment for further refills* 7 tablet 0  . traMADol (ULTRAM) 50 MG tablet Take 1-2 tablets (50-100 mg total) by mouth 2 (two) times daily as needed. 100 tablet 1  . valsartan (DIOVAN) 160 MG tablet Take 1 tablet (160 mg total) by mouth daily. 90 tablet 3  . Cholecalciferol (VITAMIN D3) 1000 UNITS tablet Take 1,000 Units by mouth daily.      Marland Kitchen loratadine (CLARITIN) 10 MG tablet Take 1 tablet (10 mg total) by mouth daily. (Patient not taking: Reported on 07/23/2016) 100 tablet 3   No facility-administered medications prior to visit.     ROS Review of Systems  Constitutional: Negative for appetite change, fatigue and unexpected weight change.  HENT:  Negative for congestion, nosebleeds, sneezing, sore throat and trouble swallowing.   Eyes: Negative for itching and visual disturbance.  Respiratory: Negative for cough.   Cardiovascular: Negative for chest pain, palpitations and leg swelling.  Gastrointestinal: Negative for abdominal distention, blood in stool, diarrhea and nausea.  Genitourinary: Negative for frequency and hematuria.  Musculoskeletal: Positive for arthralgias and back pain. Negative for gait problem, joint swelling and neck pain.  Skin: Negative for rash.  Neurological: Negative for dizziness, tremors, speech difficulty and weakness.  Psychiatric/Behavioral: Negative for agitation, dysphoric mood, sleep disturbance and suicidal ideas. The patient is not nervous/anxious.     Objective:  BP 112/62   Pulse 64   Temp 98.8 F (37.1 C) (Oral)   Resp 14   Ht 6' (1.829 m)   Wt 228 lb (103.4 kg)   SpO2 96%   BMI 30.92 kg/m   BP Readings from Last 3 Encounters:  07/23/16 112/62  07/20/16 126/78  03/19/16 126/86    Wt Readings from Last 3 Encounters:  07/23/16 228 lb (103.4 kg)  07/20/16 225 lb (102.1 kg)  03/19/16 220 lb (99.8 kg)    Physical Exam  Constitutional: He is oriented to person, place, and time. He appears well-developed. No distress.  NAD  HENT:  Mouth/Throat: Oropharynx is clear and moist.  Eyes: Conjunctivae are  normal. Pupils are equal, round, and reactive to light.  Neck: Normal range of motion. No JVD present. No thyromegaly present.  Cardiovascular: Normal rate, regular rhythm, normal heart sounds and intact distal pulses.  Exam reveals no gallop and no friction rub.   No murmur heard. Pulmonary/Chest: Effort normal and breath sounds normal. No respiratory distress. He has no wheezes. He has no rales. He exhibits no tenderness.  Abdominal: Soft. Bowel sounds are normal. He exhibits no distension and no mass. There is no tenderness. There is no rebound and no guarding.  Musculoskeletal: Normal  range of motion. He exhibits no edema or tenderness.  Lymphadenopathy:    He has no cervical adenopathy.  Neurological: He is alert and oriented to person, place, and time. He has normal reflexes. No cranial nerve deficit. He exhibits normal muscle tone. He displays a negative Romberg sign. Coordination and gait normal.  Skin: Skin is warm and dry. No rash noted.  Psychiatric: He has a normal mood and affect. His behavior is normal. Judgment and thought content normal.    Lab Results  Component Value Date   WBC 10.1 05/04/2013   HGB 14.8 05/04/2013   HCT 43.5 05/04/2013   PLT 215.0 05/04/2013   GLUCOSE 120 (H) 01/16/2016   CHOL 104 01/16/2016   TRIG 84.0 01/16/2016   HDL 34.30 (L) 01/16/2016   LDLDIRECT 198.1 02/08/2013   LDLCALC 53 01/16/2016   ALT 22 09/24/2015   AST 17 09/24/2015   NA 139 01/16/2016   K 4.2 01/16/2016   CL 104 01/16/2016   CREATININE 0.88 01/16/2016   BUN 12 01/16/2016   CO2 23 01/16/2016   TSH 1.70 09/24/2015   PSA 0.80 02/08/2013   HGBA1C 6.5 07/20/2016    Dg Eye Foreign Body  Result Date: 05/05/2012 *RADIOLOGY REPORT* Clinical Data: 61 year old male with history of metal exposure to the orbits, planned MRI. ORBITS FOR FOREIGN BODY - 2 VIEW Comparison: None. Findings: There is no evidence of metallic foreign body within the orbits.  No significant bone abnormality identified. . IMPRESSION: No evidence of metallic foreign body within the orbits. Original Report Authenticated By: Roselyn Reef, M.D.   Mr Hand Right W Wo Contrast  Result Date: 05/05/2012 *RADIOLOGY REPORT* Clinical Data: Right thumb and thenar eminence pain.  Burning sensation. BUN and creatinine were obtained on site at [facility] at [address] Results:  BUN 9 mg/dL,  Creatinine 0.9 mg/dL. MRI OF THE RIGHT HAND WITHOUT AND WITH CONTRAST Technique:  Multiplanar, multisequence MR imaging was performed both before and after administration of intravenous contrast. Contrast: 39mL  MULTIHANCE GADOBENATE DIMEGLUMINE 529 MG/ML IV SOLN Comparison: None. Findings: There is no edema in the muscles of the thenar eminence. The median nerve is of normal signal intensity within the carpal tunnel.  It does have a more anterior to posterior orientation within the carpal tunnel than usual but this can be seen as a normal variant.  There is no evidence of a neuroma or other abnormality of the branches of the median nerve to the thenar eminence. There is what appears to be a small metallic artifact in the soft tissues of the anterior aspect of the thumb at the level of the shaft of the proximal phalangeal bone.  Has the patient had any injections are soft tissue injuries at that site.  The underlying flexor tendon appears normal.  There is minimal edema in the base of the proximal phalangeal j bone of the thumb consistent with slight  arthritis. After contrast administration there is no pathologic enhancement. IMPRESSION: 1.  No evidence of a lesion of the median nerve or its branches in the hand. 2.  No edema of the muscles of the thenar eminence. 3.  Small probable metallic artifact in the soft tissues of the proximal thumb. 4.  Minimal arthritic changes of the first metacarpal phalangeal joint. Original Report Authenticated By: Lorriane Shire, M.D.    Assessment & Plan:   There are no diagnoses linked to this encounter. I am having Mr. Regina maintain his aspirin, cholecalciferol, ibuprofen, loratadine, glucose blood, pantoprazole, valsartan, canagliflozin, traMADol, carvedilol, ONETOUCH DELICA LANCETS 99991111, rosuvastatin, spironolactone, Linagliptin-Metformin HCl ER, and bromocriptine.  No orders of the defined types were placed in this encounter.    Follow-up: No Follow-up on file.  [name], MD

## 2016-07-23 NOTE — Assessment & Plan Note (Signed)
R>>L Will sch carotid doppler

## 2016-07-23 NOTE — Assessment & Plan Note (Signed)
A1c ok

## 2016-08-04 ENCOUNTER — Other Ambulatory Visit: Payer: Self-pay | Admitting: Cardiovascular Disease

## 2016-08-13 NOTE — Addendum Note (Signed)
Addended by: Karle Barr on: 08/13/2016 03:23 PM   Modules accepted: Orders

## 2016-08-14 ENCOUNTER — Other Ambulatory Visit: Payer: Self-pay | Admitting: Internal Medicine

## 2016-08-25 ENCOUNTER — Inpatient Hospital Stay (HOSPITAL_COMMUNITY): Admission: RE | Admit: 2016-08-25 | Payer: 59 | Source: Ambulatory Visit

## 2016-09-01 ENCOUNTER — Ambulatory Visit (HOSPITAL_COMMUNITY)
Admission: RE | Admit: 2016-09-01 | Discharge: 2016-09-01 | Disposition: A | Discharge status: Home / Self Care | Payer: 59 | Source: Ambulatory Visit | Attending: Cardiovascular Disease | Admitting: Cardiovascular Disease

## 2016-09-01 ENCOUNTER — Encounter (HOSPITAL_COMMUNITY): Payer: Self-pay

## 2016-09-01 DIAGNOSIS — I6522 Occlusion and stenosis of left carotid artery: Secondary | ICD-10-CM | POA: Insufficient documentation

## 2016-09-01 DIAGNOSIS — I6523 Occlusion and stenosis of bilateral carotid arteries: Secondary | ICD-10-CM | POA: Diagnosis not present

## 2016-09-01 DIAGNOSIS — I739 Peripheral vascular disease, unspecified: Secondary | ICD-10-CM

## 2016-09-01 DIAGNOSIS — I779 Disorder of arteries and arterioles, unspecified: Secondary | ICD-10-CM | POA: Insufficient documentation

## 2016-09-01 NOTE — Progress Notes (Unsigned)
Patient had carotid duplex done today. There is progression of disease on the right side now in the 80-99% range. Patient states he feels fine and denies any cerebrovascular symptoms. Suggest PV consult.

## 2016-09-02 ENCOUNTER — Telehealth: Payer: Self-pay | Admitting: Internal Medicine

## 2016-09-02 DIAGNOSIS — I6523 Occlusion and stenosis of bilateral carotid arteries: Secondary | ICD-10-CM

## 2016-09-02 NOTE — Telephone Encounter (Signed)
Pt called and informed  Vasc surg ref - he is aware Thx

## 2016-09-09 ENCOUNTER — Other Ambulatory Visit: Payer: Self-pay | Admitting: Cardiovascular Disease

## 2016-09-09 ENCOUNTER — Other Ambulatory Visit: Payer: Self-pay | Admitting: Internal Medicine

## 2016-09-10 NOTE — Telephone Encounter (Signed)
spironolactone (ALDACTONE) 25 MG tablet  Medication  Date: 08/05/2016 Department: [address] Office Ordering/Authorizing: Thayer Headings, MD  Order Providers   Prescribing Provider Encounter Provider  Thayer Headings, MD Thayer Headings, MD  Medication Detail    Disp Refills Start End   spironolactone (ALDACTONE) 25 MG tablet 7 tablet 0 08/05/2016    Sig - Route: Take 0.5 tablets (12.5 mg total) by mouth daily. *Pt is overdue for an appt and needs to call and schedule for further refills* - Oral   E-Prescribing Status: Receipt confirmed by pharmacy (08/05/2016 9:33 AM EST)   Pharmacy   [id number] - [place], [address] [place]

## 2016-09-17 DIAGNOSIS — J0100 Acute maxillary sinusitis, unspecified: Secondary | ICD-10-CM | POA: Diagnosis not present

## 2016-10-04 ENCOUNTER — Telehealth: Payer: Self-pay

## 2016-10-04 NOTE — Telephone Encounter (Signed)
I do not have any New Pt appts sooner than the 24th of apr  How do you wish to proceed?

## 2016-10-04 NOTE — Telephone Encounter (Signed)
Phone call from pt.  Reported having a "bad headache yesterday"  Stated he took Aspirin and laid down for approx. 40 min., and the headache subsided.  Denied any associated symptoms with headache; denied vision loss, confusion, unilateral weakness of extremities, speech difficulty.  Stated he has decreased strength in hips and legs after sitting for a period of time, and some unsteady balance, when he 1st starts walking.  Advised will try to move his new pt. appt. for carotid disease to an earlier date.  Advised if symptoms worsen he should contact his PCP or go to the ER, since he will be a new patient to our office.  Verb. Understanding.

## 2016-10-14 ENCOUNTER — Encounter: Payer: Self-pay | Admitting: Vascular Surgery

## 2016-10-25 ENCOUNTER — Other Ambulatory Visit: Payer: Self-pay | Admitting: Vascular Surgery

## 2016-10-25 DIAGNOSIS — I6521 Occlusion and stenosis of right carotid artery: Secondary | ICD-10-CM

## 2016-10-26 ENCOUNTER — Ambulatory Visit (INDEPENDENT_AMBULATORY_CARE_PROVIDER_SITE_OTHER): Payer: 59 | Admitting: Vascular Surgery

## 2016-10-26 ENCOUNTER — Ambulatory Visit (HOSPITAL_COMMUNITY)
Admission: RE | Admit: 2016-10-26 | Discharge: 2016-10-26 | Disposition: A | Discharge status: Home / Self Care | Payer: 59 | Source: Ambulatory Visit | Attending: Vascular Surgery | Admitting: Vascular Surgery

## 2016-10-26 ENCOUNTER — Encounter: Payer: Self-pay | Admitting: Vascular Surgery

## 2016-10-26 ENCOUNTER — Telehealth: Payer: Self-pay | Admitting: Vascular Surgery

## 2016-10-26 VITALS — BP 100/63 | HR 54 | Temp 98.5°F | Resp 16 | Ht 72.0 in | Wt 223.0 lb

## 2016-10-26 DIAGNOSIS — I6521 Occlusion and stenosis of right carotid artery: Secondary | ICD-10-CM | POA: Diagnosis not present

## 2016-10-26 DIAGNOSIS — I6523 Occlusion and stenosis of bilateral carotid arteries: Secondary | ICD-10-CM

## 2016-10-26 LAB — VAS US CAROTID
RCCADSYS: -127 cm/s
RCCAPDIAS: 39 cm/s
RCCAPSYS: 124 cm/s
RIGHT CCA MID DIAS: 29 cm/s
RIGHT ECA DIAS: -80 cm/s

## 2016-10-26 NOTE — Progress Notes (Signed)
[facility]ist of [place]  Patient name: [name] MRN: [id number] DOB: 03/10/1956 Sex: male  REASON FOR CONSULT: Evaluation of carotid disease  HPI: [name] is a 61 y.o. male, who is seen today for evaluation of carotid disease. He is here today with his wife. He has a long history of known carotid disease and has had serial follow-up. 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 Is status post coronary bypass grafting in 2003. Has had serial ultrasounds and now has progressed to critical stenosis in his right internal carotid artery he does have some vague global symptoms of weakness in both hips and legs this comes and goes. Does not have any focal neurologic deficit. He is right-handed. Does have a history of aortic valvular disease  Past Medical History:  Diagnosis Date  . Aortic valve sclerosis    aortic valve sclerosis  . CAD (coronary artery disease)    nuclear 2005  /    . Carotid artery disease ([place])    Doppler, July, 2011, stable, chronic total occlusion LCCA, retrograde flow from LECA to LICA, ,, 6-64%QIHK,,, 74-25% RICA  . Dizziness   . Ejection fraction < 50%    EF 40%, echo 2006, no ICD needed  / EF 40%, echo, 11/2009  . Former cigarette smoker   . 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 Hyperlipidemia    Low HDL  . Hypertension   . Mitral regurgitation    mild, echo, 11/2009  . Peripheral vascular disease ([place])    Dr. Erskin Burnet  . Sleep disorder    Dr Gwenette Greet, lose weight and consider sleep study later  . TIA (transient ischemic attack)    by history  . Volume overload     Family History  Problem Relation Age of Onset  . Coronary artery disease      Male 1st degree relative <50  . Diabetes      1st degree relative  . Stroke      Male 1st degree relative<50  . Stroke Mother   . Diabetes Father   . COPD Father   . Arthritis Father   .  Heart disease Father     SOCIAL HISTORY: Social History   Social History  . Marital status: Married    Spouse name: N/A  . Number of children: N/A  . Years of education: N/A   Occupational History  . Not on file.   Social History Main Topics  . Smoking status: Former Smoker    Quit date: 05/05/2002  . Smokeless tobacco: Never Used  . Alcohol use No  . Drug use: No  . Sexual activity: Yes   Other Topics Concern  . Not on file   Social History Narrative   No Regular exercise    No Known Allergies  Current Outpatient Prescriptions  Medication Sig Dispense Refill  . aspirin 81 MG EC tablet Take 81 mg by mouth daily.      . bromocriptine (PARLODEL) 2.5 MG tablet 1/2 tab daily 45 tablet 3  . canagliflozin (INVOKANA) 100 MG TABS tablet Take 1 tablet (100 mg total) by mouth daily. 90 tablet 3  . carvedilol (COREG) 25 MG tablet TAKE 1 TABLET (25 MG TOTAL) BY MOUTH 2 (TWO) TIMES DAILY WITH A MEAL. 180 tablet 1  . Cholecalciferol (VITAMIN D3) 1000 UNITS tablet Take 1,000 Units by mouth daily.      Marland Kitchen  glucose blood (ONETOUCH VERIO) test strip Use as instructed 50 each 11  . ibuprofen (ADVIL,MOTRIN) 600 MG tablet Take 1 tablet (600 mg total) by mouth as needed. 100 tablet 3  . ONETOUCH DELICA LANCETS 94W MISC USE AS DIRECTED AS NEEDED 100 each 3  . pantoprazole (PROTONIX) 40 MG tablet TAKE 1 TABLET (40 MG TOTAL) BY MOUTH DAILY. 90 tablet 3  . rosuvastatin (CRESTOR) 40 MG tablet TAKE 1 TABLET EVERY DAY 90 tablet 3  . Saxagliptin-Metformin (KOMBIGLYZE XR) 11-998 MG TB24 Take by mouth.    . spironolactone (ALDACTONE) 25 MG tablet Take 0.5 tablets (12.5 mg total) by mouth daily. *Pt is overdue for an appt and needs to call and schedule for further refills* 7 tablet 0  . traMADol (ULTRAM) 50 MG tablet Take 1-2 tablets (50-100 mg total) by mouth 2 (two) times daily as needed. 100 tablet 1  . valsartan (DIOVAN) 160 MG tablet Take 1 tablet (160 mg total) by mouth daily. 90 tablet 3   No  current facility-administered medications for this visit.     REVIEW OF SYSTEMS:  [X]  denotes positive finding, [ ]  denotes negative finding Cardiac  Comments:  Chest pain or chest pressure:    Shortness of breath upon exertion:    Short of breath when lying flat:    Irregular heart rhythm:        Vascular    Pain in calf, thigh, or hip brought on by ambulation: x   Pain in feet at night that wakes you up from your sleep:     Blood clot in your veins:    Leg swelling:         Pulmonary    Oxygen at home:    Productive cough:     Wheezing:         Neurologic    Sudden weakness in arms or legs:  x   Sudden numbness in arms or legs:     Sudden onset of difficulty speaking or slurred speech:    Temporary loss of vision in one eye:     Problems with dizziness:         Gastrointestinal    Blood in stool:     Vomited blood:         Genitourinary    Burning when urinating:     Blood in urine:        Psychiatric    Major depression:         Hematologic    Bleeding problems:    Problems with blood clotting too easily:        Skin    Rashes or ulcers:        Constitutional    Fever or chills:      PHYSICAL EXAM: Vitals:   10/26/16 0947 10/26/16 0951  BP: (!) 96/56 100/63  Pulse: (!) 54   Resp: 16   Temp: 98.5 F (36.9 C)   TempSrc: Oral   SpO2: 92%   Weight: 223 lb (101.2 kg)   Height: 6' (1.829 m)     GENERAL: The patient is a well-nourished male, in no acute distress. The vital signs are documented above. CARDIOVASCULAR: Plus left radial pulse 2+ right radial pulse. Carotid bruits noted bilaterally PULMONARY: There is good air exchange  ABDOMEN: Soft and non-tender  MUSCULOSKELETAL: There are no major deformities or cyanosis. NEUROLOGIC: No focal weakness or paresthesias are detected. SKIN: There are no ulcers or rashes noted. PSYCHIATRIC: The patient has a normal  affect.  DATA:  Carotid duplex reveals high-grade stenosis markedly elevated systolic  and end-diastolic velocities through the internal carotid artery proximally. The internal carotid artery becomes normal has to stenosis.  MEDICAL ISSUES: Had long discussion with patient and his wife present. Explained that he has progressed to severe stenosis in his right internal carotid artery. 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 I have recommended endarterectomy for reduction of stroke risk. I explained the procedure including 1-2% risk of stroke with surgery. Also discussed the very low possibility of cranial nerve injury resulting in hoarseness and swallowing difficulty. We will have him see cardiology for clearance and then planned with right carotid endarterectomy   [name], MD [facility] Tel [phone] Pager [phone]

## 2016-10-26 NOTE — Telephone Encounter (Signed)
Per Dr.Early's instruction on the discharge sheet for today's visit, I scheduled an appointment for the patient to see Dr.Nasher's PA Richardson Dopp on 11/02/16 at 8:15am.  This appointment is for cardiac clearance for possible surgery for R CEA. I notified patient of the appointment and I also faxed a cardiac clearance form to Dr.Nasher's office as well. awt

## 2016-10-28 ENCOUNTER — Other Ambulatory Visit: Payer: Self-pay

## 2016-10-29 ENCOUNTER — Encounter: Payer: Self-pay | Admitting: Physician Assistant

## 2016-11-01 ENCOUNTER — Encounter (HOSPITAL_COMMUNITY): Payer: Self-pay

## 2016-11-01 NOTE — Progress Notes (Addendum)
Cardiology Office Note:    Date:  11/02/2016   ID:  TERIUS JACUINDE, DOB 03-Dec-1955, MRN [id number]  PCP:  [name], MD  Cardiologist:  Dr. Liam Rogers   Electrophysiologist:  n/a  Referring MD: [name], MD   Chief Complaint  Patient presents with  . Surgical Clearance    History of Present Illness:    [name] is a 61 y.o. male with a hx of CAD s/p CABG in 2003, ischemic CM, systolic HF, HTN, HL, DM2, carotid artery disease who is being seen today for surgical clearance at the request of Early, Arvilla Meres, MD.  Patient was previously followed by Dr. Cleatis Polka.  He established with Dr. Liam Rogers in 11/16.     The patient has a known occlusion of the LICA.  He was recently seen by Dr. Donnetta Hutching and noted to have severe RICA stenosis.  He needs a CEA.    He is here alone today.  He is very active at work.  He is an Recruitment consultant.  He has to lift heavy objects (at least 40 lbs).  He can go up and down steps and walk quickly without chest pain.  He denies dyspnea on exertion, orthopnea, PND, edema, syncope.     Prior CV studies:   The following studies were reviewed today:  Carotid US 10/26/16 R 80-99  Echo 6/15 EF 30-35, diff HK, inf-lat AK, Gr 1 DD, mild LAE, mild RVE, mild RAE  Myoview 8/12 Abnormal stress nuclear study.  There is a previous large inferior lateral MI.  There is no evidence of ischemia.  The LV function is moderately-severely depressed with akinesis of the inferior lateral wall. EF 35  LHC 7/03 EF 32 LM ok LAD 70; Dx 40 prox LCx prox 30, 80 dist to OM1 RCA prox 100  Past Medical History:  Diagnosis Date  . Aortic valve sclerosis    aortic valve sclerosis  . CAD (coronary artery disease)    nuclear 2005  /    . Carotid artery disease ([place])    Doppler, July, 2011, stable, chronic total occlusion LCCA, retrograde flow from LECA to LICA, ,, 5-57%DUKG,,, 25-42% RICA  . Dizziness   . Ejection fraction < 50%    EF 40%, echo 2006, no ICD  needed  / EF 40%, echo, 11/2009  . Former cigarette smoker   . 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 Hyperlipidemia    Low HDL  . Hypertension   . Mitral regurgitation    mild, echo, 11/2009  . Peripheral vascular disease ([place])    Dr. Erskin Burnet  . Sleep disorder    Dr Gwenette Greet, lose weight and consider sleep study later  . TIA (transient ischemic attack)    by history  . Volume overload     Past Surgical History:  Procedure Laterality Date  . COLONOSCOPY  03/19/2009  . CORONARY ARTERY BYPASS GRAFT      Current Medications: Current Meds  Medication Sig  . aspirin 81 MG EC tablet Take 81 mg by mouth daily.    . bromocriptine (PARLODEL) 2.5 MG tablet 1/2 tab daily (Patient taking differently: Take 1.25 mg by mouth daily. 1/2 tab daily)  . canagliflozin (INVOKANA) 100 MG TABS tablet Take 1 tablet (100 mg total) by mouth daily.  . carvedilol (COREG) 25 MG tablet TAKE 1 TABLET (25 MG TOTAL) BY MOUTH 2 (TWO) TIMES DAILY WITH A MEAL.  Marland Kitchen Cholecalciferol (VITAMIN D3) 1000  UNITS tablet Take 1,000 Units by mouth daily.    Marland Kitchen glucose blood (ONETOUCH VERIO) test strip Use as instructed  . ibuprofen (ADVIL,MOTRIN) 600 MG tablet Take 1 tablet (600 mg total) by mouth as needed.  Glory Rosebush DELICA LANCETS 53G MISC USE AS DIRECTED AS NEEDED  . pantoprazole (PROTONIX) 40 MG tablet TAKE 1 TABLET (40 MG TOTAL) BY MOUTH DAILY.  . rosuvastatin (CRESTOR) 40 MG tablet TAKE 1 TABLET EVERY DAY  . spironolactone (ALDACTONE) 25 MG tablet Take 0.5 tablets (12.5 mg total) by mouth daily. *Pt is overdue for an appt and needs to call and schedule for further refills* (Patient taking differently: Take 12.5 mg by mouth every evening. *Pt is overdue for an appt and needs to call and schedule for further refills*)  . traMADol (ULTRAM) 50 MG tablet Take 1-2 tablets (50-100 mg total) by mouth 2 (two) times daily as needed. (Patient taking differently: Take 50 mg by mouth 2 (two) times daily as needed for moderate pain. )      Allergies:   Patient has no known allergies.   Social History   Social History  . Marital status: Married    Spouse name: N/A  . Number of children: N/A  . Years of education: N/A   Social History Main Topics  . Smoking status: Former Smoker    Quit date: 05/05/2002  . Smokeless tobacco: Never Used  . Alcohol use No  . Drug use: No  . Sexual activity: Yes   Other Topics Concern  . None   Social History Narrative   No Regular exercise     Family History  Problem Relation Age of Onset  . Stroke Mother   . Diabetes Father   . COPD Father   . Arthritis Father   . Heart disease Father   . Coronary artery disease      Male 1st degree relative <50  . Diabetes      1st degree relative  . Stroke      Male 1st degree relative<50     ROS:   Please see the history of present illness.    Review of Systems  Musculoskeletal: Positive for joint pain.   All other systems reviewed and are negative.   EKGs/Labs/Other Test Reviewed:    EKG:  EKG is  ordered today.  The ekg ordered today demonstrates Sinus bradycardia, HR 51, normal axis, T-wave inversions 2, 3, aVF, IVCD, QTc 435 ms  Recent Labs: 01/16/2016: BUN 12; Creatinine, Ser 0.88; Potassium 4.2; Sodium 139   Recent Lipid Panel    Component Value Date/Time   CHOL 104 01/16/2016 0806   TRIG 84.0 01/16/2016 0806   TRIG 92 05/09/2006 0732   HDL 34.30 (L) 01/16/2016 0806   CHOLHDL 3 01/16/2016 0806   VLDL 16.8 01/16/2016 0806   LDLCALC 53 01/16/2016 0806   LDLDIRECT 198.1 02/08/2013 1112     Physical Exam:    VS:  BP 122/76   Pulse (!) 51   Ht 6' (1.829 m)   Wt 226 lb (102.5 kg)   SpO2 97%   BMI 30.65 kg/m     Wt Readings from Last 3 Encounters:  11/02/16 226 lb (102.5 kg)  10/26/16 223 lb (101.2 kg)  07/23/16 228 lb (103.4 kg)     Physical Exam  Constitutional: He is oriented to person, place, and time. He appears well-developed and well-nourished. No distress.  HENT:  Head: Normocephalic and  atraumatic.  Eyes: No scleral icterus.  Neck:  Normal range of motion. No JVD present.  Cardiovascular: Normal rate, regular rhythm, S1 normal and S2 normal.   No murmur heard. Pulmonary/Chest: Effort normal and breath sounds normal. He has no wheezes. He has no rhonchi. He has no rales.  Abdominal: Soft. There is no tenderness.  Musculoskeletal: He exhibits no edema.  Neurological: He is alert and oriented to person, place, and time.  Skin: Skin is warm and dry.  Psychiatric: He has a normal mood and affect.    ASSESSMENT:    1. Preoperative cardiovascular examination   2. Bilateral carotid artery disease ([place])   3. Coronary artery disease involving native coronary artery of native heart without angina pectoris   4. Chronic systolic CHF (congestive heart failure) ([place])   5. Essential hypertension   6. Ischemic cardiomyopathy    PLAN:    In order of problems listed above:  1. Preoperative cardiovascular examination -  He does not have any unstable cardiac conditions.  However, he has an ischemic CM and was previously considered for ICD.  His insurance did not cover the SQ ICD. He has not had an assessment for ischemia since 2012.  His ECG does show inferior TW inversions which is new.  I reviewed his case with Dr. Liam Rogers today.  We agree he needs stress testing prior to clearing him for carotid surgery.  He will likely have an inferior defect.  But, we are mainly concerned about high risk features/anterior ischemia.  As long as his nuclear study is not high risk, he should be able to proceed with his CEA at acceptable risk.  -  Arrange Lexiscan Myoview tomorrow.   2. Bilateral carotid artery disease ([place]) - Carotid endarterectomy planned for Friday 11/05/16. Continue ASA, statin.   3. Coronary artery disease involving native coronary artery of native heart without angina pectoris -  s/p CABG in 2003. Myoview in 2012 with inferior scar.  He denies angina.  Will proceed with Nuclear  stress test to assess for high risk features prior to proceeding with CEA.  Continue ASA, statin, beta-blocker.  4. Chronic systolic CHF (congestive heart failure) ([facility]) - Class 1-2.  Continue beta-blocker, angiotensin receptor blocker, spironolactone.  5. Essential hypertension - BP controlled.   6. Ischemic cardiomyopathy - He was previously seen by Dr. Virl Axe for possible ICD.  He has never followed up since 2015.  Will need to consider referring him back at some point.    Dispo:  Return in about 6 months (around 05/05/2017) for Routine Follow Up, w/ Dr. Acie Fredrickson.   Medication Adjustments/Labs and Tests Ordered: Current medicines are reviewed at length with the patient today.  Concerns regarding medicines are outlined above.  Orders placed this visit:  Orders Placed This Encounter  Procedures  . Myocardial Perfusion Imaging  . EKG 12-Lead   Medication changes this visit: No orders of the defined types were placed in this encounter.   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 D/w Dr. Liam Rogers. Mr. Conover may proceed with carotid endarterectomy. He is low to moderate risk given his known CAD, cardiomyopathy with EF ~ 30%. Our service is available as needed throughout the perioperative period. Signed, Nicki Reaper  Kathlen Mody, PA-C    11/03/2016 4:56 PM

## 2016-11-01 NOTE — Pre-Procedure Instructions (Addendum)
[name]  11/01/2016      [id number] [address], [place] [address] Phone: [phone] Fax: [place], [place] [facility] [address] [place] [address] [place] [address] Phone: [phone] Fax: [phone]    Your procedure is scheduled on Friday, May 4th   Report to [facility] Admitting at 5:30 AM             (posted surgery time 7:30-9:44 am)   Call this number if you have problems the [facility] of surgery:  [phone], other questions, call [phone] Mon- Fri from 8-4:30pm   Remember:  Do not eat food or drink liquids after midnight Thursday.   Take these medicines the morning of surgery with A SIP OF WATER : Coreg(CARVEDILOL), Protonix                 4-5 days prior to surgery, STOP TAKING any vitamins, herbal supplements, anti-inflammatories.    How to Manage Your Diabetes Before and After Surgery  Why is it important to control my blood sugar before and after surgery? . Improving blood sugar levels before and after surgery helps healing and can limit problems. . A way of improving blood sugar control is eating a healthy diet by: o  Eating less sugar and carbohydrates o  Increasing activity/exercise o  Talking with your doctor about reaching your blood sugar goals . High blood sugars (greater than 180 mg/dL) can raise your risk of infections and slow your recovery, so you will need to focus on controlling your diabetes during the weeks before surgery. . Make sure that the doctor who takes care of your diabetes knows about your planned surgery including the date and location.  How do I manage my blood sugar before surgery? . Check your blood sugar at least 4 times a day, starting 2 days before surgery, to make sure that the level is not too high or low. o Check your blood sugar the morning of your surgery when you wake up and every  2 hours until you get to the Short Stay unit. . If your blood sugar is less than 70 mg/dL, you will need to treat for low blood sugar: o Do not take insulin. o Treat a low blood sugar (less than 70 mg/dL) with  cup of clear juice (cranberry or apple), 4 glucose tablets, OR glucose gel. o Recheck blood sugar in 15 minutes after treatment (to make sure it is greater than 70 mg/dL). If your blood sugar is not greater than 70 mg/dL on recheck, call [phone] for further instructions. . Report your blood sugar to the short stay nurse when you get to Short Stay.  . If you are admitted to the hospital after surgery: o Your blood sugar will be checked by the staff and you will probably be given insulin after surgery (instead of oral diabetes medicines) to make sure you have good blood sugar levels. o The goal for blood sugar control after surgery is 80-180 mg/dL.              WHAT DO I DO ABOUT MY DIABETES MEDICATION?   Marland Kitchen Do not take oral diabetes medicines (pills) the morning of surgery.  . THE NIGHT BEFORE SURGERY, take ___________ units of ___________insulin.       Marland Kitchen HE MORNING OF SURGERY, take _____________ units of __________insulin.  . The day  of surgery, do not take other diabetes injectables, including Byetta (exenatide), Bydureon (exenatide ER), Victoza (liraglutide), or Trulicity (dulaglutide).  . If your CBG is greater than 220 mg/dL, you may take  of your sliding scale (correction) dose of insulin.  Other Instructions:          Patient Signature:  Date:   Nurse Signature:  Date:   Reviewed and Endorsed by [facility] Patient Education Committee, August 2015  Do not wear jewelry, make-up or nail polish.  Do not wear lotions, powders, perfumes, or deoderant.  Do not shave underarms & legs 48 hours prior to surgery.     Do not bring valuables to the hospital.  [facility] is not responsible for any belongings or valuables.  Contacts, dentures or bridgework  may not be worn into surgery.  Leave your suitcase in the car.  After surgery it may be brought to your room.  For patients admitted to the hospital, discharge time will be determined by your treatment team.  Please read over the following fact sheets that you were given. Pain Booklet, MRSA Information and Surgical Site Infection Prevention

## 2016-11-02 ENCOUNTER — Encounter (HOSPITAL_COMMUNITY): Payer: Self-pay

## 2016-11-02 ENCOUNTER — Encounter: Payer: Self-pay | Admitting: Physician Assistant

## 2016-11-02 ENCOUNTER — Telehealth (HOSPITAL_COMMUNITY): Payer: Self-pay | Admitting: *Deleted

## 2016-11-02 ENCOUNTER — Other Ambulatory Visit: Payer: Self-pay | Admitting: Internal Medicine

## 2016-11-02 ENCOUNTER — Ambulatory Visit (INDEPENDENT_AMBULATORY_CARE_PROVIDER_SITE_OTHER): Payer: 59 | Admitting: Physician Assistant

## 2016-11-02 ENCOUNTER — Encounter (HOSPITAL_COMMUNITY)
Admission: RE | Admit: 2016-11-02 | Discharge: 2016-11-02 | Disposition: A | Discharge status: Home / Self Care | Payer: 59 | Source: Ambulatory Visit | Attending: Vascular Surgery | Admitting: Vascular Surgery

## 2016-11-02 VITALS — BP 122/76 | HR 51 | Ht 72.0 in | Wt 226.0 lb

## 2016-11-02 DIAGNOSIS — E119 Type 2 diabetes mellitus without complications: Secondary | ICD-10-CM

## 2016-11-02 DIAGNOSIS — Z8249 Family history of ischemic heart disease and other diseases of the circulatory system: Secondary | ICD-10-CM

## 2016-11-02 DIAGNOSIS — I5022 Chronic systolic (congestive) heart failure: Secondary | ICD-10-CM

## 2016-11-02 DIAGNOSIS — Z01812 Encounter for preprocedural laboratory examination: Secondary | ICD-10-CM | POA: Insufficient documentation

## 2016-11-02 DIAGNOSIS — I779 Disorder of arteries and arterioles, unspecified: Secondary | ICD-10-CM

## 2016-11-02 DIAGNOSIS — I255 Ischemic cardiomyopathy: Secondary | ICD-10-CM

## 2016-11-02 DIAGNOSIS — Z01810 Encounter for preprocedural cardiovascular examination: Secondary | ICD-10-CM

## 2016-11-02 DIAGNOSIS — I10 Essential (primary) hypertension: Secondary | ICD-10-CM | POA: Diagnosis not present

## 2016-11-02 DIAGNOSIS — I2510 Atherosclerotic heart disease of native coronary artery without angina pectoris: Secondary | ICD-10-CM

## 2016-11-02 DIAGNOSIS — I739 Peripheral vascular disease, unspecified: Secondary | ICD-10-CM

## 2016-11-02 DIAGNOSIS — E785 Hyperlipidemia, unspecified: Secondary | ICD-10-CM | POA: Insufficient documentation

## 2016-11-02 HISTORY — DX: Acute myocardial infarction, unspecified: I21.9

## 2016-11-02 HISTORY — DX: Unspecified osteoarthritis, unspecified site: M19.90

## 2016-11-02 HISTORY — DX: Type 2 diabetes mellitus without complications: E11.9

## 2016-11-02 LAB — URINALYSIS, ROUTINE W REFLEX MICROSCOPIC
BACTERIA UA: NONE SEEN
BILIRUBIN URINE: NEGATIVE
Glucose, UA: >=500 mg/dL — AB
Hgb urine dipstick: NEGATIVE
KETONES UR: NEGATIVE mg/dL
LEUKOCYTES UA: NEGATIVE
NITRITE: NEGATIVE
PH: 5 (ref 5.0–8.0)
PROTEIN: NEGATIVE mg/dL
RBC / HPF: NONE SEEN RBC/hpf (ref 0–5)
Specific Gravity, Urine: 1.016 (ref 1.005–1.030)
Squamous Epithelial / LPF: NONE SEEN

## 2016-11-02 LAB — COMPREHENSIVE METABOLIC PANEL
ALT: 23 U/L (ref 17–63)
AST: 22 U/L (ref 15–41)
Albumin: 4.3 g/dL (ref 3.5–5.0)
Alkaline Phosphatase: 50 U/L (ref 38–126)
Anion gap: 7 (ref 5–15)
BILIRUBIN TOTAL: 1 mg/dL (ref 0.3–1.2)
BUN: 11 mg/dL (ref 6–20)
CALCIUM: 9.6 mg/dL (ref 8.9–10.3)
CO2: 27 mmol/L (ref 22–32)
CREATININE: 0.9 mg/dL (ref 0.61–1.24)
Chloride: 103 mmol/L (ref 101–111)
GFR calc Af Amer: >60 mL/min (ref >60)
Glucose, Bld: 114 mg/dL — ABNORMAL HIGH (ref 65–99)
Potassium: 4.3 mmol/L (ref 3.5–5.1)
Sodium: 137 mmol/L (ref 135–145)
TOTAL PROTEIN: 7.6 g/dL (ref 6.5–8.1)

## 2016-11-02 LAB — APTT: APTT: 40 s — AB (ref 24–36)

## 2016-11-02 LAB — CBC
HCT: 44 % (ref 39.0–52.0)
Hemoglobin: 15.3 g/dL (ref 13.0–17.0)
MCH: 32.5 pg (ref 26.0–34.0)
MCHC: 34.8 g/dL (ref 30.0–36.0)
MCV: 93.4 fL (ref 78.0–100.0)
Platelets: 151 10*3/uL (ref 150–400)
RBC: 4.71 MIL/uL (ref 4.22–5.81)
RDW: 12.5 % (ref 11.5–15.5)
WBC: 6.8 10*3/uL (ref 4.0–10.5)

## 2016-11-02 LAB — TYPE AND SCREEN
ABO/RH(D): A POS
Antibody Screen: NEGATIVE

## 2016-11-02 LAB — SURGICAL PCR SCREEN
MRSA, PCR: NEGATIVE
STAPHYLOCOCCUS AUREUS: POSITIVE — AB

## 2016-11-02 LAB — ABO/RH: ABO/RH(D): A POS

## 2016-11-02 LAB — PROTIME-INR
INR: 1.07
PROTHROMBIN TIME: 14 s (ref 11.4–15.2)

## 2016-11-02 LAB — GLUCOSE, CAPILLARY: Glucose-Capillary: 108 mg/dL — ABNORMAL HIGH (ref 65–99)

## 2016-11-02 NOTE — Telephone Encounter (Signed)
Patient given detailed instructions per Myocardial Perfusion Study Information Sheet for the test on 11/03/16 at 0730. Patient notified to arrive 15 minutes early and that it is imperative to arrive on time for appointment to keep from having the test rescheduled.  If you need to cancel or reschedule your appointment, please call the office within 24 hours of your appointment. Failure to do so may result in a cancellation of your appointment, and a $50 no show fee. Patient verbalized understanding.Natara Monfort, Ranae Palms

## 2016-11-02 NOTE — Progress Notes (Signed)
   11/02/16 0944  OBSTRUCTIVE SLEEP APNEA  Have you ever been diagnosed with sleep apnea through a sleep study? No  Do you snore loudly (loud enough to be heard through closed doors)?  1  Do you often feel tired, fatigued, or sleepy during the daytime (such as falling asleep during driving or talking to someone)? 0  Has anyone observed you stop breathing during your sleep? 0  Do you have, or are you being treated for high blood pressure? 1  BMI more than 35 kg/m2? 0  Age > 50 (1-yes) 1  Neck circumference greater than:Male 16 inches or larger, Male 17inches or larger? 1  Male Gender (Yes=1) 1  Obstructive Sleep Apnea Score 5  Score 5 or greater  Results sent to PCP

## 2016-11-02 NOTE — Patient Instructions (Addendum)
Medication Instructions:  Your physician recommends that you continue on your current medications as directed. Please refer to the Current Medication list given to you today.  Labwork: NONE ORDERED  Testing/Procedures: Your physician has requested that you have a lexiscan myoview. For further information please visit [email or web address]. Please follow instruction sheet, as given. YOU HAVE BEEN SCHEDULED FOR TOMORROW 11/03/16 7:30 AM  Follow-Up: Your physician wants you to follow-up in: [place]. Acie Fredrickson. You will receive a reminder letter in the mail two months in advance. If you don't receive a letter, please call our office to schedule the follow-up appointment.  Any Other Special Instructions Will Be Listed Below (If Applicable).  If you need a refill on your cardiac medications before your next appointment, please call your pharmacy.

## 2016-11-02 NOTE — Telephone Encounter (Signed)
Left message on voicemail in reference to upcoming appointment scheduled for 11/02/16. Phone number given for a call back so details instructions can be given. [name], [name]

## 2016-11-03 ENCOUNTER — Telehealth: Payer: Self-pay | Admitting: *Deleted

## 2016-11-03 ENCOUNTER — Encounter: Payer: Self-pay | Admitting: Physician Assistant

## 2016-11-03 ENCOUNTER — Ambulatory Visit (HOSPITAL_BASED_OUTPATIENT_CLINIC_OR_DEPARTMENT_OTHER): Payer: 59

## 2016-11-03 DIAGNOSIS — I2510 Atherosclerotic heart disease of native coronary artery without angina pectoris: Secondary | ICD-10-CM

## 2016-11-03 DIAGNOSIS — Z01810 Encounter for preprocedural cardiovascular examination: Secondary | ICD-10-CM

## 2016-11-03 LAB — MYOCARDIAL PERFUSION IMAGING
LHR: 0.35
LV sys vol: 165 mL
LVDIAVOL: 225 mL (ref 62–150)
Peak HR: 74 {beats}/min
Rest HR: 61 {beats}/min
SDS: 6
SRS: 17
SSS: 21
TID: 0.93

## 2016-11-03 LAB — HEMOGLOBIN A1C
HEMOGLOBIN A1C: 6.5 % — AB (ref 4.8–5.6)
Mean Plasma Glucose: 140 mg/dL

## 2016-11-03 MED ORDER — TECHNETIUM TC 99M TETROFOSMIN IV KIT
32.8000 | PACK | Freq: Once | INTRAVENOUS | Status: AC | PRN
  Administered 2016-11-03: 32.8 via INTRAVENOUS
  Filled 2016-11-03: qty 33

## 2016-11-03 MED ORDER — REGADENOSON 0.4 MG/5ML IV SOLN
0.4000 mg | Freq: Once | INTRAVENOUS | Status: AC
  Administered 2016-11-03: 0.4 mg via INTRAVENOUS

## 2016-11-03 MED ORDER — TECHNETIUM TC 99M TETROFOSMIN IV KIT
10.5000 | PACK | Freq: Once | INTRAVENOUS | Status: AC | PRN
  Administered 2016-11-03: 10.5 via INTRAVENOUS
  Filled 2016-11-03: qty 11

## 2016-11-03 NOTE — Telephone Encounter (Signed)
Pt notified of Myoview results and ok to proceed with surgery. I will fax over Ov note to Dr. Sherren Mocha Early. Pt thanked me for my call and ok to proceed with his surgery.

## 2016-11-03 NOTE — Telephone Encounter (Signed)
-----   Message from [name], [place] sent at 11/03/2016  4:58 PM EDT ----- Please call the patient. The stress test is c/w prior studies and shows his old heart attack.   Ok to proceed with surgery. I will send my note to the surgeon.  Please fax a copy of this study result to his PCP:  [name], MD  Thanks! Lucas Dopp, PA-C    11/03/2016 4:52 PM

## 2016-11-03 NOTE — Progress Notes (Addendum)
Anesthesia Chart Review:  Pt is a 61 year old male scheduled for R CEA on 11/05/2016 with Curt Jews, MD  - PCP is Lew Dawes, MD  PMH includes:  CAD (s/p CABG 2003), ischemic cardiomyopathy, systolic HF, HTN, DM, lipidemia, PVD, TIA, carotid stenosis. Former smoker. BMI 31.  Medications include: ASA 81 mg, bromocriptine, canagliflozin, carvedilol, kombiglyze, protonix, crestor, spironolactone, valsartan.   Preoperative labs reviewed.   - HbA1c 6.5, glucose 114. - PTT is 40.  I left voicemail for [facility] in Dr. Luther Parody office about elevated PTT.  Nuclear stress test 11/03/16:   Nuclear stress EF: 27%.  Blood pressure demonstrated a normal response to exercise.  There was no ST segment deviation noted during stress.  Findings consistent with prior myocardial infarction.  This is a high risk study.  The left ventricular ejection fraction is severely decreased (<30%). 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 no ischemia EF 27%  EKG 11/02/16: sinus bradycardia (51 bpm). Nonspecific intraventricular conduction delay. T wave abnormality, consider inferior ischemia.   Carotid duplex Right 10/26/16:  1. 80-99% R ICA stenosis 2. Doppler velocities suggest external carotid artery stenosis.   Carotid duplex 09/01/16:  - Mixed plaque with shadowing throughout on the right. - Progression of disease on the right now in the 80-99% range.  - Chronic occlusion of the LCCA. - Flow to the LICA provided by retrograde flow in the LECA.  - 7-34% LICA stenosis - Normal subclavian arteries, bilaterally. - Patent vertebral arteries with antegrade flow.  Echo 12/17/13:  - Left ventricle: The cavity size was mildly dilated. Wall thickness was normal. Systolic function was moderately to severely reduced. The estimated ejection fraction was in the range of 30% to 35%. Diffuse hypokinesis. There is akinesis of the inferolateral myocardium. Doppler parameters are consistent with abnormal  left ventricular relaxation (grade 1 diastolic dysfunction). - Left atrium: The atrium was mildly dilated. - Right ventricle: The cavity size was mildly dilated. - Right atrium: The atrium was mildly dilated. - Impressions: Technically difficult; contrast used; global hypokinesis andinferolateral akinesis; EF 30-35.  Holter monitor 12/14/13: Sinus rhythm to sinus tachycardiac, rare PAC, infrequent PVC.   Willeen Cass, FNP-BC [facility]/Anesthesiology Phone: [phone] 11/03/2016 4:50 PM  Addendum: Cardiologist is Dr. Mertie Moores. Patient has known ischemic cardiomyopathy. He has seen Dr. Virl Axe on 01/10/14 for consideration for ICD. Patient was interested in S-ICD, but he was denied and would need traditional ICD. He wanted to defer traditional ICD implant and appeal decision with his insurance company. A delay would mean that he would need a new echo and evaluation at a later date. Risks were discussed with patient by cardiology. It appears that it has been a little over a year since this issue was last discussed with EP. At his 11/02/16 visit with Richardson Dopp, PA-C there was mention that they would consider referring him back to EP in the future. In the meantime, recent stress test was reviewed and per Richardson Dopp, PA-C: "The stress test is c/w prior studies and shows his old heart attack.  Ok to proceed with surgery." (EF 27% by stress test, previously 30-35% by echo 12/17/13.)  Myra Gianotti, PA-C [facility] Short Stay Center/Anesthesiology Phone [phone] 11/04/2016 9:35 AM

## 2016-11-04 NOTE — Anesthesia Preprocedure Evaluation (Addendum)
Anesthesia Evaluation  Patient identified by MRN, date of birth, ID band Patient awake    Reviewed: Allergy & Precautions, H&P , NPO status , Patient's Chart, lab work & pertinent test results  Airway Mallampati: I  TM Distance: >3 FB Neck ROM: Full    Dental no notable dental hx. (+) Teeth Intact, Dental Advisory Given   Pulmonary neg pulmonary ROS, former smoker,    Pulmonary exam normal breath sounds clear to auscultation       Cardiovascular Exercise Tolerance: Good hypertension, Pt. on medications and Pt. on home beta blockers + CAD, + Past MI, + CABG, + Peripheral Vascular Disease and +CHF   Rhythm:Regular Rate:Normal     Neuro/Psych negative neurological ROS  negative psych ROS   GI/Hepatic negative GI ROS, Neg liver ROS,   Endo/Other  diabetes, Type 2, Oral Hypoglycemic Agents  Renal/GU negative Renal ROS  negative genitourinary   Musculoskeletal  (+) Arthritis , Osteoarthritis,    Abdominal   Peds  Hematology negative hematology ROS (+)   Anesthesia Other Findings   Reproductive/Obstetrics negative OB ROS                            Anesthesia Physical Anesthesia Plan  ASA: III  Anesthesia Plan: General   Post-op Pain Management:    Induction: Intravenous  Airway Management Planned: Oral ETT  Additional Equipment: Arterial line  Intra-op Plan:   Post-operative Plan: Extubation in OR and Possible Post-op intubation/ventilation  Informed Consent: I have reviewed the patients History and Physical, chart, labs and discussed the procedure including the risks, benefits and alternatives for the proposed anesthesia with the patient or authorized representative who has indicated his/her understanding and acceptance.   Dental advisory given  Plan Discussed with: CRNA  Anesthesia Plan Comments:        Anesthesia Quick Evaluation

## 2016-11-05 ENCOUNTER — Encounter (HOSPITAL_COMMUNITY): Payer: Self-pay | Admitting: *Deleted

## 2016-11-05 ENCOUNTER — Inpatient Hospital Stay (HOSPITAL_COMMUNITY): Payer: 59 | Admitting: Emergency Medicine

## 2016-11-05 ENCOUNTER — Inpatient Hospital Stay (HOSPITAL_COMMUNITY): Payer: 59 | Admitting: Anesthesiology

## 2016-11-05 ENCOUNTER — Inpatient Hospital Stay (HOSPITAL_COMMUNITY)
Admission: RE | Admit: 2016-11-05 | Discharge: 2016-11-06 | DRG: 039 | Disposition: A | Discharge status: Home / Self Care | Payer: 59 | Source: Ambulatory Visit | Attending: Vascular Surgery | Admitting: Vascular Surgery

## 2016-11-05 ENCOUNTER — Encounter (HOSPITAL_COMMUNITY)
Admission: RE | Disposition: A | Discharge status: Home / Self Care | Payer: Self-pay | Source: Ambulatory Visit | Attending: Vascular Surgery

## 2016-11-05 ENCOUNTER — Telehealth: Payer: Self-pay | Admitting: Vascular Surgery

## 2016-11-05 DIAGNOSIS — E119 Type 2 diabetes mellitus without complications: Secondary | ICD-10-CM | POA: Diagnosis present

## 2016-11-05 DIAGNOSIS — I5022 Chronic systolic (congestive) heart failure: Secondary | ICD-10-CM | POA: Diagnosis not present

## 2016-11-05 DIAGNOSIS — Z7982 Long term (current) use of aspirin: Secondary | ICD-10-CM

## 2016-11-05 DIAGNOSIS — Z79899 Other long term (current) drug therapy: Secondary | ICD-10-CM | POA: Diagnosis not present

## 2016-11-05 DIAGNOSIS — E785 Hyperlipidemia, unspecified: Secondary | ICD-10-CM | POA: Diagnosis present

## 2016-11-05 DIAGNOSIS — Z951 Presence of aortocoronary bypass graft: Secondary | ICD-10-CM

## 2016-11-05 DIAGNOSIS — I6521 Occlusion and stenosis of right carotid artery: Principal | ICD-10-CM | POA: Diagnosis present

## 2016-11-05 DIAGNOSIS — I252 Old myocardial infarction: Secondary | ICD-10-CM | POA: Diagnosis not present

## 2016-11-05 DIAGNOSIS — I739 Peripheral vascular disease, unspecified: Secondary | ICD-10-CM | POA: Diagnosis present

## 2016-11-05 DIAGNOSIS — Z8673 Personal history of transient ischemic attack (TIA), and cerebral infarction without residual deficits: Secondary | ICD-10-CM

## 2016-11-05 DIAGNOSIS — I2510 Atherosclerotic heart disease of native coronary artery without angina pectoris: Secondary | ICD-10-CM | POA: Diagnosis present

## 2016-11-05 DIAGNOSIS — I10 Essential (primary) hypertension: Secondary | ICD-10-CM | POA: Diagnosis present

## 2016-11-05 DIAGNOSIS — I358 Other nonrheumatic aortic valve disorders: Secondary | ICD-10-CM | POA: Diagnosis present

## 2016-11-05 DIAGNOSIS — Z87891 Personal history of nicotine dependence: Secondary | ICD-10-CM

## 2016-11-05 HISTORY — PX: ENDARTERECTOMY: SHX5162

## 2016-11-05 HISTORY — PX: PATCH ANGIOPLASTY: SHX6230

## 2016-11-05 LAB — GLUCOSE, CAPILLARY
GLUCOSE-CAPILLARY: 139 mg/dL — AB (ref 65–99)
GLUCOSE-CAPILLARY: 110 mg/dL — AB (ref 65–99)
Glucose-Capillary: 109 mg/dL — ABNORMAL HIGH (ref 65–99)
Glucose-Capillary: 112 mg/dL — ABNORMAL HIGH (ref 65–99)

## 2016-11-05 LAB — CBC
HEMATOCRIT: 41.4 % (ref 39.0–52.0)
Hemoglobin: 13.7 g/dL (ref 13.0–17.0)
MCH: 31.4 pg (ref 26.0–34.0)
MCHC: 33.1 g/dL (ref 30.0–36.0)
MCV: 95 fL (ref 78.0–100.0)
PLATELETS: 130 10*3/uL — AB (ref 150–400)
RBC: 4.36 MIL/uL (ref 4.22–5.81)
RDW: 12.7 % (ref 11.5–15.5)
WBC: 8.9 10*3/uL (ref 4.0–10.5)

## 2016-11-05 LAB — CREATININE, SERUM
Creatinine, Ser: 0.85 mg/dL (ref 0.61–1.24)
GFR calc non Af Amer: >60 mL/min (ref >60)

## 2016-11-05 SURGERY — ENDARTERECTOMY, CAROTID
Anesthesia: General | Site: Neck | Laterality: Right

## 2016-11-05 MED ORDER — CANAGLIFLOZIN 100 MG PO TABS
100.0000 mg | ORAL_TABLET | Freq: Every day | ORAL | Status: DC
  Administered 2016-11-05 – 2016-11-06 (×2): 100 mg via ORAL
  Filled 2016-11-05 (×2): qty 1

## 2016-11-05 MED ORDER — HEPARIN SODIUM (PORCINE) 1000 UNIT/ML IJ SOLN
INTRAMUSCULAR | Status: DC | PRN
  Administered 2016-11-05: 10000 [IU] via INTRAVENOUS

## 2016-11-05 MED ORDER — METFORMIN HCL 500 MG PO TABS
1000.0000 mg | ORAL_TABLET | Freq: Every day | ORAL | Status: DC
  Administered 2016-11-06: 1000 mg via ORAL
  Filled 2016-11-05: qty 2

## 2016-11-05 MED ORDER — EPHEDRINE SULFATE 50 MG/ML IJ SOLN
INTRAMUSCULAR | Status: DC | PRN
  Administered 2016-11-05: 5 mg via INTRAVENOUS
  Administered 2016-11-05: 2.5 mg via INTRAVENOUS

## 2016-11-05 MED ORDER — CHLORHEXIDINE GLUCONATE CLOTH 2 % EX PADS: 6.0000 | MEDICATED_PAD | Freq: Once | CUTANEOUS | Status: DC

## 2016-11-05 MED ORDER — FENTANYL CITRATE (PF) 100 MCG/2ML IJ SOLN
INTRAMUSCULAR | Status: DC | PRN
  Administered 2016-11-05 (×3): 50 ug via INTRAVENOUS

## 2016-11-05 MED ORDER — ESMOLOL HCL 100 MG/10ML IV SOLN
INTRAVENOUS | Status: DC | PRN
  Administered 2016-11-05: 30 mg via INTRAVENOUS
  Administered 2016-11-05: 40 mg via INTRAVENOUS

## 2016-11-05 MED ORDER — DEXTROSE 5 % IV SOLN
INTRAVENOUS | Status: DC | PRN
  Administered 2016-11-05: 25 ug/min via INTRAVENOUS

## 2016-11-05 MED ORDER — ONDANSETRON HCL 4 MG/2ML IJ SOLN: 4.0000 mg | Freq: Four times a day (QID) | INTRAMUSCULAR | Status: DC | PRN

## 2016-11-05 MED ORDER — FENTANYL CITRATE (PF) 250 MCG/5ML IJ SOLN
INTRAMUSCULAR | Status: AC
  Filled 2016-11-05: qty 5

## 2016-11-05 MED ORDER — MAGNESIUM SULFATE 2 GM/50ML IV SOLN: 2.0000 g | Freq: Every day | INTRAVENOUS | Status: DC | PRN

## 2016-11-05 MED ORDER — METOPROLOL TARTRATE 5 MG/5ML IV SOLN: 2.0000 mg | INTRAVENOUS | Status: DC | PRN

## 2016-11-05 MED ORDER — LABETALOL HCL 5 MG/ML IV SOLN
INTRAVENOUS | Status: DC | PRN
  Administered 2016-11-05 (×2): 5 mg via INTRAVENOUS

## 2016-11-05 MED ORDER — ACETAMINOPHEN 650 MG RE SUPP: 325.0000 mg | RECTAL | Status: DC | PRN

## 2016-11-05 MED ORDER — ONDANSETRON HCL 4 MG/2ML IJ SOLN
INTRAMUSCULAR | Status: AC
  Filled 2016-11-05: qty 2

## 2016-11-05 MED ORDER — POTASSIUM CHLORIDE CRYS ER 20 MEQ PO TBCR: 20.0000 meq | EXTENDED_RELEASE_TABLET | Freq: Every day | ORAL | Status: DC | PRN

## 2016-11-05 MED ORDER — VITAMIN D 1000 UNITS PO TABS
1000.0000 [IU] | ORAL_TABLET | Freq: Every day | ORAL | Status: DC
  Administered 2016-11-05 – 2016-11-06 (×2): 1000 [IU] via ORAL
  Filled 2016-11-05 (×2): qty 1

## 2016-11-05 MED ORDER — LIDOCAINE 2% (20 MG/ML) 5 ML SYRINGE
INTRAMUSCULAR | Status: AC
  Filled 2016-11-05: qty 5

## 2016-11-05 MED ORDER — PROTAMINE SULFATE 10 MG/ML IV SOLN
INTRAVENOUS | Status: DC | PRN
  Administered 2016-11-05: 50 mg via INTRAVENOUS

## 2016-11-05 MED ORDER — SODIUM CHLORIDE 0.9 % IV SOLN
0.0500 ug/kg/min | INTRAVENOUS | Status: AC
  Administered 2016-11-05: 0.15 ug/kg/min via INTRAVENOUS
  Administered 2016-11-05: 0.1 ug/kg/min via INTRAVENOUS
  Filled 2016-11-05: qty 5000

## 2016-11-05 MED ORDER — LINAGLIPTIN 5 MG PO TABS
5.0000 mg | ORAL_TABLET | Freq: Every day | ORAL | Status: DC
  Administered 2016-11-05 – 2016-11-06 (×2): 5 mg via ORAL
  Filled 2016-11-05 (×2): qty 1

## 2016-11-05 MED ORDER — GUAIFENESIN-DM 100-10 MG/5ML PO SYRP: 15.0000 mL | ORAL_SOLUTION | ORAL | Status: DC | PRN

## 2016-11-05 MED ORDER — HYDRALAZINE HCL 20 MG/ML IJ SOLN: 5.0000 mg | INTRAMUSCULAR | Status: DC | PRN

## 2016-11-05 MED ORDER — MORPHINE SULFATE (PF) 4 MG/ML IV SOLN
2.0000 mg | INTRAVENOUS | Status: DC | PRN
  Administered 2016-11-05: 4 mg via INTRAVENOUS
  Filled 2016-11-05: qty 1

## 2016-11-05 MED ORDER — DOCUSATE SODIUM 100 MG PO CAPS
100.0000 mg | ORAL_CAPSULE | Freq: Every day | ORAL | Status: DC
  Administered 2016-11-06: 100 mg via ORAL
  Filled 2016-11-05: qty 1

## 2016-11-05 MED ORDER — ENOXAPARIN SODIUM 40 MG/0.4ML ~~LOC~~ SOLN: 40.0000 mg | SUBCUTANEOUS | Status: DC

## 2016-11-05 MED ORDER — ROCURONIUM BROMIDE 10 MG/ML (PF) SYRINGE
PREFILLED_SYRINGE | INTRAVENOUS | Status: DC | PRN
  Administered 2016-11-05: 10 mg via INTRAVENOUS

## 2016-11-05 MED ORDER — SODIUM CHLORIDE 0.9 % IV SOLN: 500.0000 mL | Freq: Once | INTRAVENOUS | Status: DC | PRN

## 2016-11-05 MED ORDER — LABETALOL HCL 5 MG/ML IV SOLN: 10.0000 mg | INTRAVENOUS | Status: DC | PRN

## 2016-11-05 MED ORDER — ONDANSETRON HCL 4 MG/2ML IJ SOLN
INTRAMUSCULAR | Status: DC | PRN
  Administered 2016-11-05: 4 mg via INTRAVENOUS

## 2016-11-05 MED ORDER — CARVEDILOL 25 MG PO TABS
25.0000 mg | ORAL_TABLET | Freq: Two times a day (BID) | ORAL | Status: DC
  Administered 2016-11-05 – 2016-11-06 (×2): 25 mg via ORAL
  Filled 2016-11-05 (×2): qty 1

## 2016-11-05 MED ORDER — BROMOCRIPTINE MESYLATE 2.5 MG PO TABS
1.2500 mg | ORAL_TABLET | Freq: Every day | ORAL | Status: DC
  Administered 2016-11-05 – 2016-11-06 (×2): 1.25 mg via ORAL
  Filled 2016-11-05 (×2): qty 1

## 2016-11-05 MED ORDER — ALUM & MAG HYDROXIDE-SIMETH 200-200-20 MG/5ML PO SUSP: 15.0000 mL | ORAL | Status: DC | PRN

## 2016-11-05 MED ORDER — PHENOL 1.4 % MT LIQD
1.0000 | OROMUCOSAL | Status: DC | PRN
  Filled 2016-11-05: qty 177

## 2016-11-05 MED ORDER — FENTANYL CITRATE (PF) 100 MCG/2ML IJ SOLN: 25.0000 ug | INTRAMUSCULAR | Status: DC | PRN

## 2016-11-05 MED ORDER — SODIUM CHLORIDE 0.9 % IV SOLN
INTRAVENOUS | Status: DC | PRN
  Administered 2016-11-05: 07:00:00

## 2016-11-05 MED ORDER — SODIUM CHLORIDE 0.9 % IV SOLN: INTRAVENOUS | Status: DC

## 2016-11-05 MED ORDER — MIDAZOLAM HCL 5 MG/5ML IJ SOLN
INTRAMUSCULAR | Status: DC | PRN
  Administered 2016-11-05: 1 mg via INTRAVENOUS

## 2016-11-05 MED ORDER — LACTATED RINGERS IV SOLN
INTRAVENOUS | Status: DC | PRN
  Administered 2016-11-05: 07:00:00 via INTRAVENOUS

## 2016-11-05 MED ORDER — SPIRONOLACTONE 25 MG PO TABS
12.5000 mg | ORAL_TABLET | Freq: Every evening | ORAL | Status: DC
  Administered 2016-11-05: 12.5 mg via ORAL
  Filled 2016-11-05: qty 1

## 2016-11-05 MED ORDER — LIDOCAINE HCL 1 % IJ SOLN
INTRAMUSCULAR | Status: AC
  Filled 2016-11-05: qty 20

## 2016-11-05 MED ORDER — ASPIRIN EC 81 MG PO TBEC
81.0000 mg | DELAYED_RELEASE_TABLET | Freq: Every day | ORAL | Status: DC
  Administered 2016-11-05 – 2016-11-06 (×2): 81 mg via ORAL
  Filled 2016-11-05 (×2): qty 1

## 2016-11-05 MED ORDER — PROPOFOL 10 MG/ML IV BOLUS
INTRAVENOUS | Status: DC | PRN
  Administered 2016-11-05: 120 mg via INTRAVENOUS

## 2016-11-05 MED ORDER — PHENYLEPHRINE 40 MCG/ML (10ML) SYRINGE FOR IV PUSH (FOR BLOOD PRESSURE SUPPORT)
PREFILLED_SYRINGE | INTRAVENOUS | Status: DC | PRN
  Administered 2016-11-05: 40 ug via INTRAVENOUS

## 2016-11-05 MED ORDER — ROSUVASTATIN CALCIUM 40 MG PO TABS
40.0000 mg | ORAL_TABLET | Freq: Every day | ORAL | Status: DC
  Administered 2016-11-05: 40 mg via ORAL
  Filled 2016-11-05: qty 1

## 2016-11-05 MED ORDER — 0.9 % SODIUM CHLORIDE (POUR BTL) OPTIME
TOPICAL | Status: DC | PRN
  Administered 2016-11-05: 2000 mL

## 2016-11-05 MED ORDER — DEXTROSE 5 % IV SOLN
1.5000 g | Freq: Two times a day (BID) | INTRAVENOUS | Status: AC
  Administered 2016-11-05 – 2016-11-06 (×2): 1.5 g via INTRAVENOUS
  Filled 2016-11-05 (×2): qty 1.5

## 2016-11-05 MED ORDER — TRAMADOL HCL 50 MG PO TABS
50.0000 mg | ORAL_TABLET | Freq: Two times a day (BID) | ORAL | Status: DC | PRN
  Administered 2016-11-05: 50 mg via ORAL
  Filled 2016-11-05: qty 1

## 2016-11-05 MED ORDER — ACETAMINOPHEN 325 MG PO TABS: 325.0000 mg | ORAL_TABLET | ORAL | Status: DC | PRN

## 2016-11-05 MED ORDER — SUGAMMADEX SODIUM 200 MG/2ML IV SOLN
INTRAVENOUS | Status: DC | PRN
  Administered 2016-11-05: 200 mg via INTRAVENOUS

## 2016-11-05 MED ORDER — DEXTROSE 5 % IV SOLN
1.5000 g | INTRAVENOUS | Status: AC
  Administered 2016-11-05: 1.5 g via INTRAVENOUS
  Filled 2016-11-05: qty 1.5

## 2016-11-05 MED ORDER — MIDAZOLAM HCL 2 MG/2ML IJ SOLN
INTRAMUSCULAR | Status: AC
  Filled 2016-11-05: qty 2

## 2016-11-05 MED ORDER — PROPOFOL 10 MG/ML IV BOLUS
INTRAVENOUS | Status: AC
  Filled 2016-11-05: qty 20

## 2016-11-05 MED ORDER — PANTOPRAZOLE SODIUM 40 MG PO TBEC
40.0000 mg | DELAYED_RELEASE_TABLET | Freq: Every day | ORAL | Status: DC
  Administered 2016-11-06: 40 mg via ORAL
  Filled 2016-11-05: qty 1

## 2016-11-05 MED ORDER — INSULIN ASPART 100 UNIT/ML ~~LOC~~ SOLN: 0.0000 [IU] | Freq: Three times a day (TID) | SUBCUTANEOUS | Status: DC

## 2016-11-05 MED ORDER — LIDOCAINE 2% (20 MG/ML) 5 ML SYRINGE
INTRAMUSCULAR | Status: DC | PRN
  Administered 2016-11-05: 60 mg via INTRAVENOUS

## 2016-11-05 MED ORDER — ROCURONIUM BROMIDE 10 MG/ML (PF) SYRINGE
PREFILLED_SYRINGE | INTRAVENOUS | Status: AC
  Filled 2016-11-05: qty 5

## 2016-11-05 MED ORDER — IRBESARTAN 150 MG PO TABS
150.0000 mg | ORAL_TABLET | Freq: Every day | ORAL | Status: DC
Start: 2016-11-05 — End: 2016-11-06
  Administered 2016-11-05 – 2016-11-06 (×2): 150 mg via ORAL
  Filled 2016-11-05 (×2): qty 1

## 2016-11-05 SURGICAL SUPPLY — 38 items
CANISTER SUCT 3000ML PPV (MISCELLANEOUS) ×2 IMPLANT
CANNULA VESSEL 3MM 2 BLNT TIP (CANNULA) ×4 IMPLANT
CATH ROBINSON RED A/P 18FR (CATHETERS) ×2 IMPLANT
CLIP LIGATING EXTRA MED SLVR (CLIP) ×2 IMPLANT
CLIP LIGATING EXTRA SM BLUE (MISCELLANEOUS) ×2 IMPLANT
CRADLE DONUT ADULT HEAD (MISCELLANEOUS) ×2 IMPLANT
DECANTER SPIKE VIAL GLASS SM (MISCELLANEOUS) IMPLANT
DERMABOND ADVANCED (GAUZE/BANDAGES/DRESSINGS) ×1
DERMABOND ADVANCED .7 DNX12 (GAUZE/BANDAGES/DRESSINGS) ×1 IMPLANT
DRAIN HEMOVAC 1/8 X 5 (WOUND CARE) IMPLANT
ELECT REM PT RETURN 9FT ADLT (ELECTROSURGICAL) ×2
ELECTRODE REM PT RTRN 9FT ADLT (ELECTROSURGICAL) ×1 IMPLANT
EVACUATOR SILICONE 100CC (DRAIN) IMPLANT
GLOVE BIOGEL PI IND STRL 6.5 (GLOVE) ×1 IMPLANT
GLOVE BIOGEL PI INDICATOR 6.5 (GLOVE) ×1
GLOVE SS BIOGEL STRL SZ 7.5 (GLOVE) ×1 IMPLANT
GLOVE SUPERSENSE BIOGEL SZ 7.5 (GLOVE) ×1
GLOVE SURG SS PI 6.5 STRL IVOR (GLOVE) ×2 IMPLANT
GOWN STRL REUS W/ TWL LRG LVL3 (GOWN DISPOSABLE) ×3 IMPLANT
GOWN STRL REUS W/TWL LRG LVL3 (GOWN DISPOSABLE) ×3
KIT BASIN OR (CUSTOM PROCEDURE TRAY) ×2 IMPLANT
KIT ROOM TURNOVER OR (KITS) ×2 IMPLANT
NEEDLE 22X1 1/2 (OR ONLY) (NEEDLE) IMPLANT
NS IRRIG 1000ML POUR BTL (IV SOLUTION) ×4 IMPLANT
PACK CAROTID (CUSTOM PROCEDURE TRAY) ×2 IMPLANT
PAD ARMBOARD 7.5X6 YLW CONV (MISCELLANEOUS) ×4 IMPLANT
PATCH HEMASHIELD 8X75 (Vascular Products) ×2 IMPLANT
SHUNT CAROTID BYPASS 10 (VASCULAR PRODUCTS) ×2 IMPLANT
SHUNT CAROTID BYPASS 12FRX15.5 (VASCULAR PRODUCTS) IMPLANT
SUT ETHILON 3 0 PS 1 (SUTURE) IMPLANT
SUT PROLENE 6 0 CC (SUTURE) ×4 IMPLANT
SUT SILK 3 0 (SUTURE)
SUT SILK 3-0 18XBRD TIE 12 (SUTURE) IMPLANT
SUT VIC AB 3-0 SH 27 (SUTURE) ×2
SUT VIC AB 3-0 SH 27X BRD (SUTURE) ×2 IMPLANT
SUT VICRYL 4-0 PS2 18IN ABS (SUTURE) ×2 IMPLANT
SYR CONTROL 10ML LL (SYRINGE) IMPLANT
WATER STERILE IRR 1000ML POUR (IV SOLUTION) ×2 IMPLANT

## 2016-11-05 NOTE — Anesthesia Postprocedure Evaluation (Signed)
Anesthesia Post Note  Patient: ADOLPHUS HANF  Procedure(s) Performed: Procedure(s) (LRB): ENDARTERECTOMY RIGHT CAROTID (Right) RIGHT CAROTID ARTERY PATCH ANGIOPLASTY USING HEMASHIELD PLATINUM FINESSE PATCH (Right)  Patient location during evaluation: PACU Anesthesia Type: General Level of consciousness: awake and alert Pain management: pain level controlled Vital Signs Assessment: post-procedure vital signs reviewed and stable Respiratory status: spontaneous breathing, nonlabored ventilation, respiratory function stable and patient connected to nasal cannula oxygen Cardiovascular status: blood pressure returned to baseline and stable Postop Assessment: no signs of nausea or vomiting Anesthetic complications: no       Last Vitals:  Vitals:   11/05/16 1050 11/05/16 1100  BP: 123/67   Pulse: 62 (!) 59  Resp: (!) 22 (!) 22  Temp:      Last Pain:  Vitals:   11/05/16 1105  PainSc: 0-No pain                 Elizeo Rodriques,W. EDMOND

## 2016-11-05 NOTE — Telephone Encounter (Signed)
-----   Message from Mena Goes, RN sent at 11/05/2016 11:41 AM EDT ----- Regarding: 2-3 weeks   ----- Message ----- From: Gabriel Earing, PA-C Sent: 11/05/2016  11:12 AM To: Vvs Charge Pool  s/p right CEA 11/05/16. f/u with Dr. Donnetta Hutching in 2-3 weeks.  Thanks

## 2016-11-05 NOTE — Interval H&P Note (Signed)
History and Physical Interval Note:  11/05/2016 7:19 AM  [name]  has presented today for surgery, with the diagnosis of Right carotid artery stenosis I65.21  The various methods of treatment have been discussed with the patient and family. After consideration of risks, benefits and other options for treatment, the patient has consented to  Procedure(s): ENDARTERECTOMY RIGHT CAROTID (Right) as a surgical intervention .  The patient's history has been reviewed, patient examined, no change in status, stable for surgery.  I have reviewed the patient's chart and labs.  Questions were answered to the patient's satisfaction.     Curt Jews

## 2016-11-05 NOTE — Anesthesia Procedure Notes (Signed)
Procedure Name: Intubation Date/Time: 11/05/2016 7:33 AM Performed by: Merrilyn Puma B Pre-anesthesia Checklist: Patient identified, Emergency Drugs available, Suction available, Patient being monitored and Timeout performed Patient Re-evaluated:Patient Re-evaluated prior to inductionOxygen Delivery Method: Circle system utilized Intubation Type: IV induction Ventilation: Mask ventilation without difficulty and Oral airway inserted - appropriate to patient size Laryngoscope Size: Mac and 4 Grade View: Grade III Tube type: Oral Tube size: 7.5 mm Number of attempts: 3 Airway Equipment and Method: Stylet Placement Confirmation: ETT inserted through vocal cords under direct vision,  positive ETCO2,  CO2 detector and breath sounds checked- equal and bilateral Secured at: 24 cm Tube secured with: Tape Dental Injury: Teeth and Oropharynx as per pre-operative assessment  Comments: DLx1 with Miller 2 - grade 4 view, DLx1 with MAC 4 - grade 4 view, readjusted pillows and with cricoid pressure grade 3 obtained.  7.5ETT passed atraumatically.

## 2016-11-05 NOTE — Telephone Encounter (Signed)
Sched appt 11/16/16 at 11:00. Lm on cell# for pt to confirm appt.

## 2016-11-05 NOTE — Op Note (Signed)
    OPERATIVE REPORT  DATE OF SURGERY: 11/05/2016  PATIENT: [name], 61 y.o. male MRN: [id number]  DOB: [birth year]  PRE-OPERATIVE DIAGNOSIS: Severe right internal carotid artery stenosis  POST-OPERATIVE DIAGNOSIS:  Same  PROCEDURE: Right carotid endarterectomy and Dacron patch angioplasty  SURGEON:  Curt Jews, M.D.  PHYSICIAN ASSISTANT: [name] PA-C  ANESTHESIA:  Gen.  EBL: 50 ml  Total I/O In: 9774 [P.O.:240; I.V.:1100] Out: 450 [Urine:400; Blood:50]  BLOOD ADMINISTERED: None  DRAINS: None  SPECIMEN: None  COUNTS CORRECT:  YES  PLAN OF CARE: PACU neurologically intact   PATIENT DISPOSITION:  PACU - hemodynamically stable  PROCEDURE DETAILS: The patient was taken to the operative placed supine position where the area of the right neck was prepped in sterile fashion. Incision was made anterior to the sternocleidomastoid and carried down through the platysma with left cautery. The sternocleidomastoid reflected posteriorly and the carotid sheath was opened. The facial vein was ligated with 2-0 silk ties and divided. The vagus and hypoglossal nerves were identified and preserved. The ansa was divided. The common carotid artery was encircled with an umbilical tape and Rummel tourniquet. The superior thyroid artery was encircled with a 2-0 silk Potts tie the external carotid was encircled with a blue vessel loop and the internal carotid was encircled with an umbilical tape and Rummel tourniquet. The patient was given 10,000 units intravenous heparin and after adequate circulation time the internal/external and common carotid arteries were occluded. The common carotid artery was opened with 11 blade some ulcerative Potts scissors through the plaque onto the internal carotid artery. A 10 shunt was passed up the internal carotid and allowed to back bleed. Is in the past down the common carotid artery were secured with Rummel tourniquet. The endarterectomy was then on the common  carotid artery plaque was divided proximally with Potts scissors. Endarterectomy was controlled bifurcation external carotid was endarterectomized with an eversion technique and the internal carotid was endarterectomized in an open fashion. Remaining atheromatous debris was removed from the endarterectomy and this thin S Hemashield Dacron patch onto the field and sewn as a patch angioplasty with a running 6-0 Prolene suture. Prior to completion of the closure the shunt was removed and the usual flushing maneuvers were undertaken. Anastomosis completed and flow was 4 first to the external and the internal carotid artery. Excellent flow characteristics were noted with hand-held Doppler in the internal and external carotid arteries. Patient was given 50 mg of protamine to reverse heparin. Wounds irrigated with saline. Hemostasis tablet cautery. Wounds were closed with 3-0 Vicryl to reapproximate sternocleidomastoid and the carotid sheath. Next the platysma was closed running 3-0 Vicryl suture and find the skin was closed with 4 -0 Vicryl suture. Sterile dressing was applied the patient transferred to the recovery room stable condition neurologically intact   [name], M.D., [facility] 11/05/2016 3:25 PM

## 2016-11-05 NOTE — Transfer of Care (Signed)
Immediate Anesthesia Transfer of Care Note  Patient: [name]  Procedure(s) Performed: Procedure(s): ENDARTERECTOMY RIGHT CAROTID (Right) RIGHT CAROTID ARTERY PATCH ANGIOPLASTY USING HEMASHIELD PLATINUM FINESSE PATCH (Right)  Patient Location: PACU  Anesthesia Type:General  Level of Consciousness: awake, alert  and oriented  Airway & Oxygen Therapy: Patient Spontanous Breathing and Patient connected to nasal cannula oxygen  Post-op Assessment: Report given to RN, Post -op Vital signs reviewed and stable and Patient moving all extremities X 4  Post vital signs: Reviewed and stable  Last Vitals:  Vitals:   11/05/16 0602  BP: (!) 153/79  Pulse: 60  Resp: 18  Temp: 36.8 C   HR 73, BP 159/60, RR 19, Sats 95% Last Pain: There were no vitals filed for this visit.    Patients Stated Pain Goal: 3 (47/07/[birth year] [id number])  Complications: No apparent anesthesia complications

## 2016-11-05 NOTE — H&P (View-Only) (Signed)
Vascular and Vein Specialist of [place]  Patient name: [name] MRN: [id number] DOB: 01-Oct-1955 Sex: male  REASON FOR CONSULT: Evaluation of carotid disease  HPI: [name] is a 61 y.o. male, who is seen today for evaluation of carotid disease. He is here today with his wife. He has a long history of known carotid disease and has had serial follow-up. 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 Is status post coronary bypass grafting in 2003. Has had serial ultrasounds and now has progressed to critical stenosis in his right internal carotid artery he does have some vague global symptoms of weakness in both hips and legs this comes and goes. Does not have any focal neurologic deficit. He is right-handed. Does have a history of aortic valvular disease  Past Medical History:  Diagnosis Date  . Aortic valve sclerosis    aortic valve sclerosis  . CAD (coronary artery disease)    nuclear 2005  /    . Carotid artery disease ([place])    Doppler, July, 2011, stable, chronic total occlusion LCCA, retrograde flow from LECA to LICA, ,, 7-41%ULAG,,, 53-64% RICA  . Dizziness   . Ejection fraction < 50%    EF 40%, echo 2006, no ICD needed  / EF 40%, echo, 11/2009  . Former cigarette smoker   . 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 Hyperlipidemia    Low HDL  . Hypertension   . Mitral regurgitation    mild, echo, 11/2009  . Peripheral vascular disease ([place])    Dr. Erskin Burnet  . Sleep disorder    Dr Gwenette Greet, lose weight and consider sleep study later  . TIA (transient ischemic attack)    by history  . Volume overload     Family History  Problem Relation Age of Onset  . Coronary artery disease      Male 1st degree relative <50  . Diabetes      1st degree relative  . Stroke      Male 1st degree relative<50  . Stroke Mother   . Diabetes Father   . COPD Father   . Arthritis Father   .  Heart disease Father     SOCIAL HISTORY: Social History   Social History  . Marital status: Married    Spouse name: N/A  . Number of children: N/A  . Years of education: N/A   Occupational History  . Not on file.   Social History Main Topics  . Smoking status: Former Smoker    Quit date: 05/05/2002  . Smokeless tobacco: Never Used  . Alcohol use No  . Drug use: No  . Sexual activity: Yes   Other Topics Concern  . Not on file   Social History Narrative   No Regular exercise    No Known Allergies  Current Outpatient Prescriptions  Medication Sig Dispense Refill  . aspirin 81 MG EC tablet Take 81 mg by mouth daily.      . bromocriptine (PARLODEL) 2.5 MG tablet 1/2 tab daily 45 tablet 3  . canagliflozin (INVOKANA) 100 MG TABS tablet Take 1 tablet (100 mg total) by mouth daily. 90 tablet 3  . carvedilol (COREG) 25 MG tablet TAKE 1 TABLET (25 MG TOTAL) BY MOUTH 2 (TWO) TIMES DAILY WITH A MEAL. 180 tablet 1  . Cholecalciferol (VITAMIN D3) 1000 UNITS tablet Take 1,000 Units by mouth daily.      Marland Kitchen  glucose blood (ONETOUCH VERIO) test strip Use as instructed 50 each 11  . ibuprofen (ADVIL,MOTRIN) 600 MG tablet Take 1 tablet (600 mg total) by mouth as needed. 100 tablet 3  . ONETOUCH DELICA LANCETS 33I MISC USE AS DIRECTED AS NEEDED 100 each 3  . pantoprazole (PROTONIX) 40 MG tablet TAKE 1 TABLET (40 MG TOTAL) BY MOUTH DAILY. 90 tablet 3  . rosuvastatin (CRESTOR) 40 MG tablet TAKE 1 TABLET EVERY DAY 90 tablet 3  . Saxagliptin-Metformin (KOMBIGLYZE XR) 11-998 MG TB24 Take by mouth.    . spironolactone (ALDACTONE) 25 MG tablet Take 0.5 tablets (12.5 mg total) by mouth daily. *Pt is overdue for an appt and needs to call and schedule for further refills* 7 tablet 0  . traMADol (ULTRAM) 50 MG tablet Take 1-2 tablets (50-100 mg total) by mouth 2 (two) times daily as needed. 100 tablet 1  . valsartan (DIOVAN) 160 MG tablet Take 1 tablet (160 mg total) by mouth daily. 90 tablet 3   No  current facility-administered medications for this visit.     REVIEW OF SYSTEMS:  [X]  denotes positive finding, [ ]  denotes negative finding Cardiac  Comments:  Chest pain or chest pressure:    Shortness of breath upon exertion:    Short of breath when lying flat:    Irregular heart rhythm:        Vascular    Pain in calf, thigh, or hip brought on by ambulation: x   Pain in feet at night that wakes you up from your sleep:     Blood clot in your veins:    Leg swelling:         Pulmonary    Oxygen at home:    Productive cough:     Wheezing:         Neurologic    Sudden weakness in arms or legs:  x   Sudden numbness in arms or legs:     Sudden onset of difficulty speaking or slurred speech:    Temporary loss of vision in one eye:     Problems with dizziness:         Gastrointestinal    Blood in stool:     Vomited blood:         Genitourinary    Burning when urinating:     Blood in urine:        Psychiatric    Major depression:         Hematologic    Bleeding problems:    Problems with blood clotting too easily:        Skin    Rashes or ulcers:        Constitutional    Fever or chills:      PHYSICAL EXAM: Vitals:   10/26/16 0947 10/26/16 0951  BP: (!) 96/56 100/63  Pulse: (!) 54   Resp: 16   Temp: 98.5 F (36.9 C)   TempSrc: Oral   SpO2: 92%   Weight: 223 lb (101.2 kg)   Height: 6' (1.829 m)     GENERAL: The patient is a well-nourished male, in no acute distress. The vital signs are documented above. CARDIOVASCULAR: Plus left radial pulse 2+ right radial pulse. Carotid bruits noted bilaterally PULMONARY: There is good air exchange  ABDOMEN: Soft and non-tender  MUSCULOSKELETAL: There are no major deformities or cyanosis. NEUROLOGIC: No focal weakness or paresthesias are detected. SKIN: There are no ulcers or rashes noted. PSYCHIATRIC: The patient has a normal  affect.  DATA:  Carotid duplex reveals high-grade stenosis markedly elevated systolic  and end-diastolic velocities through the internal carotid artery proximally. The internal carotid artery becomes normal has to stenosis.  MEDICAL ISSUES: Had long discussion with patient and his wife present. Explained that he has progressed to severe stenosis in his right internal carotid artery. 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 I have recommended endarterectomy for reduction of stroke risk. I explained the procedure including 1-2% risk of stroke with surgery. Also discussed the very low possibility of cranial nerve injury resulting in hoarseness and swallowing difficulty. We will have him see cardiology for clearance and then planned with right carotid endarterectomy   [name], MD [facility] Specialists of [facility] Tel [phone] Pager [phone]

## 2016-11-05 NOTE — Progress Notes (Signed)
  Day of Surgery Note    Subjective:  No complaints; swallowing okay  Vitals:   11/05/16 1045 11/05/16 1050  BP:  123/67  Pulse: (!) 59 62  Resp: 20 (!) 22  Temp:      Incisions:   Clean and dry without hematoma Extremities:  Moving all extremities equally Cardiac:  regular Lungs:  Non labored Neuro:  In tact; tongue is midline  Assessment/Plan:  This is a 61 y.o. male who is s/p right carotid endarterectomy  -pt doing well in recovery room and neuro in tact. -to [place] when bed available -anticipate discharge home tomorrow   Leontine Locket, PA-C 11/05/2016 11:11 AM [phone]

## 2016-11-06 LAB — GLUCOSE, CAPILLARY: GLUCOSE-CAPILLARY: 103 mg/dL — AB (ref 65–99)

## 2016-11-06 LAB — CBC
HCT: 40.5 % (ref 39.0–52.0)
Hemoglobin: 13.3 g/dL (ref 13.0–17.0)
MCH: 31.1 pg (ref 26.0–34.0)
MCHC: 32.8 g/dL (ref 30.0–36.0)
MCV: 94.6 fL (ref 78.0–100.0)
PLATELETS: 126 10*3/uL — AB (ref 150–400)
RBC: 4.28 MIL/uL (ref 4.22–5.81)
RDW: 13 % (ref 11.5–15.5)
WBC: 10.1 10*3/uL (ref 4.0–10.5)

## 2016-11-06 LAB — BASIC METABOLIC PANEL
Anion gap: 11 (ref 5–15)
BUN: 13 mg/dL (ref 6–20)
CO2: 25 mmol/L (ref 22–32)
CREATININE: 0.7 mg/dL (ref 0.61–1.24)
Calcium: 8.9 mg/dL (ref 8.9–10.3)
Chloride: 101 mmol/L (ref 101–111)
GFR calc Af Amer: >60 mL/min (ref >60)
GLUCOSE: 112 mg/dL — AB (ref 65–99)
Potassium: 3.8 mmol/L (ref 3.5–5.1)
SODIUM: 137 mmol/L (ref 135–145)

## 2016-11-06 NOTE — Progress Notes (Addendum)
  Vascular and Vein Specialists Progress Note  Subjective  - POD #1  Throat is a little sore. Had some right neck soreness yesterday that has significantly improved.  Objective Vitals:   11/06/16 0318 11/06/16 0700  BP: 121/64 126/72  Pulse: 68 (!) 58  Resp: 15 17  Temp: 97.7 F (36.5 C) 98.3 F (36.8 C)    Intake/Output Summary (Last 24 hours) at 11/06/16 0742 Last data filed at 11/06/16 0708  Gross per 24 hour  Intake             1650 ml  Output             1675 ml  Net              -25 ml    Right neck without hematoma Tongue midline. Smile symmetric. 5/5 strength upper and lower extremities.   Assessment/Planning: 61 y.o. male is s/p: right carotid endarterectomy 1 Day Post-Op   Neuro exam intact.  Right neck without hematoma. No swallowing issues. Voiding without difficulty. Needs to ambulate. D/c home today. F/u in 2 weeks.   [name] 11/06/2016 7:42 AM -- Agree with above No neck hematoma CN x xii intact UE/LE motor 5/5  D/c home  Ruta Hinds, MD [facility] of [place] Office: [phone] Pager: [phone]  Laboratory CBC    Component Value Date/Time   WBC 10.1 11/06/2016 0500   HGB 13.3 11/06/2016 0500   HCT 40.5 11/06/2016 0500   PLT 126 (L) 11/06/2016 0500    BMET    Component Value Date/Time   NA 137 11/06/2016 0500   K 3.8 11/06/2016 0500   CL 101 11/06/2016 0500   CO2 25 11/06/2016 0500   GLUCOSE 112 (H) 11/06/2016 0500   BUN 13 11/06/2016 0500   CREATININE 0.70 11/06/2016 0500   CALCIUM 8.9 11/06/2016 0500   GFRNONAA >60 11/06/2016 0500   GFRAA >60 11/06/2016 0500    COAG Lab Results  Component Value Date   INR 1.07 11/02/2016   No results found for: PTT  Antibiotics Anti-infectives    Start     Dose/Rate Route Frequency Ordered Stop   11/05/16 1930  cefUROXime (ZINACEF) 1.5 g in dextrose 5 % 50 mL IVPB     1.5 g 100 mL/hr over 30 Minutes Intravenous Every 12 hours 11/05/16 1148  11/06/16 1929   11/05/16 0617  cefUROXime (ZINACEF) 1.5 g in dextrose 5 % 50 mL IVPB     1.5 g 100 mL/hr over 30 Minutes Intravenous 30 min pre-op 11/05/16 0617 11/05/16 [place], PA-C [facility]ists Office: [phone] Pager: [phone] 11/06/2016 7:42 AM

## 2016-11-06 NOTE — Progress Notes (Signed)
Discharge instructions given to patient and patients wife, all questions answered at this time.  Pt. VSS with no s/s of distress noted.  Pt. Stable for discharge.

## 2016-11-08 ENCOUNTER — Encounter (HOSPITAL_COMMUNITY): Payer: Self-pay | Admitting: Vascular Surgery

## 2016-11-08 NOTE — Discharge Summary (Signed)
[facility] Discharge Summary  [name] [birth year] 61 y.o. male  [id number]  Admission Date: 11/05/2016  Discharge Date: 11/06/2016  Physician: Curt Jews, MD  Admission Diagnosis: Asymptomatic stenosis of right carotid artery [I65.21]  HPI:   This is a 61 y.o. male who was seen for evaluation of carotid disease. He is here today with his wife. He has a long history of known carotid disease and has had serial follow-up. 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 Is status post coronary bypass grafting in 2003. Has had serial ultrasounds and now has progressed to critical stenosis in his right internal carotid artery he does have some vague global symptoms of weakness in both hips and legs this comes and goes. Does not have any focal neurologic deficit. He is right-handed. Does have a history of aortic valvular disease  Hospital Course:  The patient was admitted to the hospital and taken to the operating room on 11/05/2016 and underwent right carotid endarterectomy.  The patient tolerated the procedure well and was transported to the PACU in stable condition.  By POD 1, the patient's neuro status was intact. His incision was without hematoma. He was tolerating a diet, ambulating and voiding without difficulty. He was discharged home on POD 1 in good condition.     Recent Labs  11/06/16 0500  NA 137  K 3.8  CL 101  CO2 25  GLUCOSE 112*  BUN 13  CALCIUM 8.9    Recent Labs  11/06/16 0500  WBC 10.1  HGB 13.3  HCT 40.5  PLT 126*   No results for input(s): INR in the last 72 hours.  Discharge Instructions:   The patient is discharged to home with extensive instructions on wound care and progressive ambulation.  They are instructed not to drive or perform any heavy lifting until returning to see the physician in his office.  Discharge Instructions    CAROTID Sugery: Call MD for difficulty  swallowing or speaking; weakness in arms or legs that is a new symtom; severe headache.  If you have increased swelling in the neck and/or  are having difficulty breathing, CALL [phone]    Complete by:  As directed    Call MD for:  redness, tenderness, or signs of infection (pain, swelling, bleeding, redness, odor or green/yellow discharge around incision site)    Complete by:  As directed    Call MD for:  severe or increased pain, loss or decreased feeling  in affected limb(s)    Complete by:  As directed    Call MD for:  temperature >100.5    Complete by:  As directed    Discharge wound care:    Complete by:  As directed    Shower daily. Wash wound daily with soap and water and pat dry. Do not apply any creams or ointments on your incisions. Do not pull off the skin glue. It will peel off on its own. Avoid shaving around your incision for two weeks.   Driving Restrictions    Complete by:  As directed    No driving for 1 week. No driving if taking any pain medication.   Increase activity slowly    Complete by:  As directed    Walk daily. Increase activity as tolerated.   Lifting restrictions    Complete by:  As directed    No heavy lifting for 2 weeks   Resume previous diet  Complete by:  As directed       Discharge Diagnosis:  Asymptomatic stenosis of right carotid artery [I65.21]  Secondary Diagnosis: Patient Active Problem List   Diagnosis Date Noted  . Asymptomatic stenosis of right carotid artery 11/05/2016  . Hearing loss d/t noise 08/08/2015  . Pain in joint, shoulder region 08/08/2015  . Chest wall pain 09/13/2014  . Acute upper respiratory infection 05/15/2014  . Obesity 05/15/2014  . Ischemic cardiomyopathy 11/09/2013  . Chronic systolic CHF (congestive heart failure) ([place]) 11/09/2013  . Discoloration of skin 05/04/2013  . Nocturnal leg cramps 05/04/2013  . PVC's (premature ventricular contractions) 03/30/2013  . Dizziness   . CAD (coronary artery disease)   .  Diabetes type 2, uncontrolled ([place]) 02/09/2013  . Cough 06/14/2012  . Neoplasm of uncertain behavior of skin 01/03/2012  . Well adult exam 12/13/2011  . Hypertension   . Peripheral vascular disease ([place])   . TIA (transient ischemic attack)   . Aortic valve sclerosis   . Ejection fraction < 50%   . Hyperlipidemia   . Volume overload   . Carotid artery disease ([place])   . Sleep disorder   . Mitral regurgitation   . BRONCHITIS 05/15/2010  . KNEE PAIN 05/15/2010  . Actinic keratosis 01/19/2010  . NECK PAIN 01/19/2010  . LEG PAIN 09/30/2009  . SKIN RASH 09/30/2009  . SNORING 01/15/2009  . ABNORMAL LABS 01/15/2009  . OTITIS MEDIA, SEROUS 05/01/2008  . 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 Aortic valve sclerosis    aortic valve sclerosis  . Arthritis   . CAD (coronary artery disease)    Myoview 5/18: 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 no ischemia EF 27%  . Carotid artery disease ([place])    Doppler, July, 2011, stable, chronic total occlusion LCCA, retrograde flow from LECA to LICA, ,, 3-84%YKZL,,, 93-57% RICA  . Diabetes mellitus without complication ([place])   . Dizziness   . Ejection fraction < 50%    EF 40%, echo 2006, no ICD needed  / EF 40%, echo, 11/2009  . Former cigarette smoker   . 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 Hyperlipidemia    Low HDL  . Hypertension   . Mitral regurgitation    mild, echo, 11/2009  . Myocardial infarction ([place])   . Peripheral vascular disease ([place])    Dr. Erskin Burnet  . Sleep disorder    Dr Gwenette Greet, lose weight and consider sleep study later  . TIA (transient ischemic attack)    by history  . Volume overload     Allergies as of 11/06/2016      Reactions   No Known Allergies       Medication List    TAKE these medications   aspirin 81 MG EC tablet Take 81 mg by mouth daily.   bromocriptine 2.5 MG tablet Commonly known as:  PARLODEL 1/2 tab daily What changed:  how much to take  how to take  this  when to take this  additional instructions   canagliflozin 100 MG Tabs tablet Commonly known as:  INVOKANA Take 1 tablet (100 mg total) by mouth daily.   carvedilol 25 MG tablet Commonly known as:  COREG TAKE 1 TABLET (25 MG TOTAL) BY MOUTH 2 (TWO) TIMES DAILY WITH A MEAL. Notes to patient:  Take second does with dinner.   cholecalciferol 1000 units tablet Commonly known as:  VITAMIN D Take 1,000  Units by mouth daily.   glucose blood test strip Commonly known as:  ONETOUCH VERIO Use as instructed   ibuprofen 600 MG tablet Commonly known as:  ADVIL,MOTRIN Take 1 tablet (600 mg total) by mouth as needed.   KOMBIGLYZE XR 11-998 MG Tb24 Generic drug:  Saxagliptin-Metformin TAKE 1 TABLET EVERY DAY BEFORE BREAKFAST   ONETOUCH DELICA LANCETS 60F Misc USE AS DIRECTED AS NEEDED   pantoprazole 40 MG tablet Commonly known as:  PROTONIX TAKE 1 TABLET (40 MG TOTAL) BY MOUTH DAILY.   rosuvastatin 40 MG tablet Commonly known as:  CRESTOR TAKE 1 TABLET EVERY DAY   spironolactone 25 MG tablet Commonly known as:  ALDACTONE Take 0.5 tablets (12.5 mg total) by mouth daily. *Pt is overdue for an appt and needs to call and schedule for further refills* What changed:  when to take this  additional instructions   traMADol 50 MG tablet Commonly known as:  ULTRAM Take 1-2 tablets (50-100 mg total) by mouth 2 (two) times daily as needed. What changed:  how much to take  reasons to take this   valsartan 160 MG tablet Commonly known as:  DIOVAN Take 1 tablet (160 mg total) by mouth daily.       Disposition: home  Patient's condition: is Good  Follow up: 1. Dr.  Donnetta Hutching in 2 weeks.   Virgina Jock, PA-C [facility] [phone]  --- For [facility] use --- Instructions: Press F2 to tab through selections.  Delete question if not applicable.   Modified Rankin score at D/C (0-6): 0  IV medication needed for:  1. Hypertension: No 2.  Hypotension: No  Post-op Complications: No  1. Post-op CVA or TIA: No  2. CN injury: No  3. Myocardial infarction: No  4.  CHF: No  5.  Dysrhythmia (new): No  6. Wound infection: No  7. Reperfusion symptoms: No  8. Return to OR: No  Discharge medications: Statin use:  Yes If No: [ ]  For Medical reasons, [ ]  Non-compliant, [ ]  Not-indicated ASA use:  Yes  If No: [ ]  For Medical reasons, [ ]  Non-compliant, [ ]  Not-indicated Beta blocker use:  Yes If No: [ ]  For Medical reasons, [ ]  Non-compliant, [ ]  Not-indicated ACE-Inhibitor use:  No, on ARB If No: [ ]  For Medical reasons, [ ]  Non-compliant, [ ]  Not-indicated P2Y12 Antagonist use: No, [ ]  Plavix, [ ]  Plasugrel, [ ]  Ticlopinine, [ ]  Ticagrelor, [ ]  Other, [ ]  No for medical reason, [ ]  Non-compliant, [x ] Not-indicated Anti-coagulant use:  No, [ ]  Warfarin, [ ]  Rivaroxaban, [ ]  Dabigatran, [ ]  Other, [ ]  No for medical reason, [ ]  Non-compliant, [x ] Not-indicated

## 2016-11-10 ENCOUNTER — Encounter: Payer: Self-pay | Admitting: Vascular Surgery

## 2016-11-16 ENCOUNTER — Encounter: Payer: Self-pay | Admitting: Vascular Surgery

## 2016-11-16 ENCOUNTER — Ambulatory Visit (INDEPENDENT_AMBULATORY_CARE_PROVIDER_SITE_OTHER): Payer: Self-pay | Admitting: Vascular Surgery

## 2016-11-16 VITALS — BP 108/72 | HR 57 | Temp 97.5°F | Resp 16 | Ht 72.0 in | Wt 223.0 lb

## 2016-11-16 DIAGNOSIS — I6523 Occlusion and stenosis of bilateral carotid arteries: Secondary | ICD-10-CM

## 2016-11-16 NOTE — Progress Notes (Signed)
   Patient name: [name] MRN: [id number] DOB: Feb 23, 1956 Sex: male  REASON FOR VISIT: Follow-up right carotid endarterectomy on 11/05/2016  HPI: [name] is a 61 y.o. male here for follow-up. Had severe asymptomatic right carotid stenosis. Had known occlusion of his common carotid artery on the left with retrograde flow in his external and antegrade flow in his internal carotid artery. He underwent uneventful endarterectomy was discharged home on postoperative day 1. He reports that he has had some overall tiredness but has had no neurologic deficits since discharge. He does report some generalized blurry vision but no unilateral vision loss.    Current Outpatient Prescriptions  Medication Sig Dispense Refill  . aspirin 81 MG EC tablet Take 81 mg by mouth daily.      . bromocriptine (PARLODEL) 2.5 MG tablet 1/2 tab daily (Patient taking differently: Take 1.25 mg by mouth daily. 1/2 tab daily) 45 tablet 3  . canagliflozin (INVOKANA) 100 MG TABS tablet Take 1 tablet (100 mg total) by mouth daily. 90 tablet 3  . carvedilol (COREG) 25 MG tablet TAKE 1 TABLET (25 MG TOTAL) BY MOUTH 2 (TWO) TIMES DAILY WITH A MEAL. 180 tablet 1  . Cholecalciferol (VITAMIN D3) 1000 UNITS tablet Take 1,000 Units by mouth daily.      Marland Kitchen glucose blood (ONETOUCH VERIO) test strip Use as instructed 50 each 11  . ibuprofen (ADVIL,MOTRIN) 600 MG tablet Take 1 tablet (600 mg total) by mouth as needed. 100 tablet 3  . KOMBIGLYZE XR 11-998 MG TB24 TAKE 1 TABLET EVERY DAY BEFORE BREAKFAST 90 tablet 3  . ONETOUCH DELICA LANCETS 24M MISC USE AS DIRECTED AS NEEDED 100 each 3  . pantoprazole (PROTONIX) 40 MG tablet TAKE 1 TABLET (40 MG TOTAL) BY MOUTH DAILY. 90 tablet 3  . rosuvastatin (CRESTOR) 40 MG tablet TAKE 1 TABLET EVERY DAY 90 tablet 3  . spironolactone (ALDACTONE) 25 MG tablet Take 0.5 tablets (12.5 mg total) by mouth daily. *Pt is overdue for an appt and needs to call and schedule  for further refills* (Patient taking differently: Take 12.5 mg by mouth every evening. *Pt is overdue for an appt and needs to call and schedule for further refills*) 7 tablet 0  . traMADol (ULTRAM) 50 MG tablet Take 1-2 tablets (50-100 mg total) by mouth 2 (two) times daily as needed. (Patient taking differently: Take 50 mg by mouth 2 (two) times daily as needed for moderate pain. ) 100 tablet 1  . valsartan (DIOVAN) 160 MG tablet Take 1 tablet (160 mg total) by mouth daily. 90 tablet 3   No current facility-administered medications for this visit.      PHYSICAL EXAM: Vitals:   11/16/16 1119 11/16/16 1121  BP: 117/73 108/72  Pulse: (!) 57   Resp: 16   Temp: 97.5 F (36.4 C)   TempSrc: Oral   SpO2: 93%   Weight: 223 lb (101.2 kg)   Height: 6' (1.829 m)     GENERAL: The patient is a well-nourished male, in no acute distress. The vital signs are documented above. Right neck incision well-healed. No bruits bilaterally. Grossly intact neurologically.  MEDICAL ISSUES: Stable postop from right carotid endarterectomy. Will resume full activities as he feels comfortable doing so. I will see him again in 6 months with repeat carotid duplex  [name], MD [facility] [place] [facility] Tel [phone] Pager [phone]

## 2016-11-17 ENCOUNTER — Telehealth: Payer: Self-pay

## 2016-11-17 NOTE — Telephone Encounter (Signed)
Spoke with patient regarding FMLA paperwork. Requested fax number to fax copy to employer. Patient returned call with phone number. FMLA was faxed to employer. Copy of FMLA was mailed to patient. Patient satisfied.

## 2016-11-23 NOTE — Addendum Note (Signed)
Addended by: Lianne Cure A on: 11/23/2016 01:10 PM   Modules accepted: Orders

## 2016-12-17 ENCOUNTER — Other Ambulatory Visit: Payer: Self-pay | Admitting: Cardiovascular Disease

## 2017-01-15 ENCOUNTER — Other Ambulatory Visit: Payer: Self-pay | Admitting: Internal Medicine

## 2017-01-17 ENCOUNTER — Encounter: Payer: Self-pay | Admitting: Endocrinology

## 2017-01-17 ENCOUNTER — Ambulatory Visit (INDEPENDENT_AMBULATORY_CARE_PROVIDER_SITE_OTHER): Payer: 59 | Admitting: Endocrinology

## 2017-01-17 VITALS — BP 126/64 | HR 69 | Ht 72.0 in | Wt 228.0 lb

## 2017-01-17 DIAGNOSIS — E1165 Type 2 diabetes mellitus with hyperglycemia: Secondary | ICD-10-CM | POA: Diagnosis not present

## 2017-01-17 DIAGNOSIS — IMO0002 Reserved for concepts with insufficient information to code with codable children: Secondary | ICD-10-CM

## 2017-01-17 DIAGNOSIS — E1159 Type 2 diabetes mellitus with other circulatory complications: Secondary | ICD-10-CM

## 2017-01-17 LAB — POCT GLYCOSYLATED HEMOGLOBIN (HGB A1C): Hemoglobin A1C: 6.5

## 2017-01-17 NOTE — Patient Instructions (Addendum)

## 2017-01-17 NOTE — Progress Notes (Signed)
Subjective:    Patient ID: [name], male    DOB: May 04, 1956, 61 y.o.   MRN: [id number]  HPI Pt returns for f/u of diabetes mellitus: DM type: 2 Dx'ed: 5329 Complications: CVA and CAD Therapy: 4 oral meds.  DKA: never Severe hypoglycemia: never Pancreatitis: never Other: he has never been on insulin.   Interval history: no cbg record, but states cbg's vary from 85-185.  There is no trend throughout the day.  pt states he feels well in general.   Past Medical History:  Diagnosis Date  . Aortic valve sclerosis    aortic valve sclerosis  . Arthritis   . CAD (coronary artery disease)    Myoview 5/18: 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 no ischemia EF 27%  . Carotid artery disease ([place])    Doppler, July, 2011, stable, chronic total occlusion LCCA, retrograde flow from LECA to LICA, ,, 9-24%QAST,,, 41-96% RICA  . Diabetes mellitus without complication ([place])   . Dizziness   . Ejection fraction < 50%    EF 40%, echo 2006, no ICD needed  / EF 40%, echo, 11/2009  . Former cigarette smoker   . 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 Hyperlipidemia    Low HDL  . Hypertension   . Mitral regurgitation    mild, echo, 11/2009  . Myocardial infarction ([place])   . Peripheral vascular disease ([place])    Dr. Erskin Burnet  . Sleep disorder    Dr Gwenette Greet, lose weight and consider sleep study later  . TIA (transient ischemic attack)    by history  . Volume overload     Past Surgical History:  Procedure Laterality Date  . CHOLECYSTECTOMY    . COLONOSCOPY  03/19/2009  . CORONARY ARTERY BYPASS GRAFT    . ENDARTERECTOMY Right 11/05/2016   Procedure: ENDARTERECTOMY RIGHT CAROTID;  Surgeon: [name], MD;  Location: [place];  Service: Vascular;  Laterality: Right;  . PATCH ANGIOPLASTY Right 11/05/2016   Procedure: RIGHT CAROTID ARTERY PATCH ANGIOPLASTY USING HEMASHIELD PLATINUM FINESSE PATCH;  Surgeon: [name], MD;  Location: [facility];  Service: Vascular;  Laterality: Right;     Social History   Social History  . Marital status: Married    Spouse name: N/A  . Number of children: N/A  . Years of education: N/A   Occupational History  . Not on file.   Social History Main Topics  . Smoking status: Former Smoker    Quit date: 05/05/2002  . Smokeless tobacco: Never Used  . Alcohol use No  . Drug use: No  . Sexual activity: Yes   Other Topics Concern  . Not on file   Social History Narrative   No Regular exercise    Current Outpatient Prescriptions on File Prior to Visit  Medication Sig Dispense Refill  . aspirin 81 MG EC tablet Take 81 mg by mouth daily.      . bromocriptine (PARLODEL) 2.5 MG tablet 1/2 tab daily (Patient taking differently: Take 1.25 mg by mouth daily. 1/2 tab daily) 45 tablet 3  . canagliflozin (INVOKANA) 100 MG TABS tablet Take 1 tablet (100 mg total) by mouth daily. 90 tablet 3  . carvedilol (COREG) 25 MG tablet TAKE 1 TABLET (25 MG TOTAL) BY MOUTH 2 (TWO) TIMES DAILY WITH A MEAL. 180 tablet 1  . Cholecalciferol (VITAMIN D3) 1000 UNITS tablet Take 1,000 Units by mouth daily.      Marland Kitchen glucose blood (  ONETOUCH VERIO) test strip Use as instructed 50 each 11  . ibuprofen (ADVIL,MOTRIN) 600 MG tablet Take 1 tablet (600 mg total) by mouth as needed. 100 tablet 3  . KOMBIGLYZE XR 11-998 MG TB24 TAKE 1 TABLET EVERY DAY BEFORE BREAKFAST 90 tablet 3  . ONETOUCH DELICA LANCETS 57W MISC USE AS DIRECTED AS NEEDED 100 each 3  . pantoprazole (PROTONIX) 40 MG tablet TAKE 1 TABLET (40 MG TOTAL) BY MOUTH DAILY. 90 tablet 3  . rosuvastatin (CRESTOR) 40 MG tablet TAKE 1 TABLET EVERY DAY 90 tablet 3  . spironolactone (ALDACTONE) 25 MG tablet Take 0.5 tablets (12.5 mg total) by mouth daily. 15 tablet 10  . traMADol (ULTRAM) 50 MG tablet Take 1-2 tablets (50-100 mg total) by mouth 2 (two) times daily as needed. (Patient taking differently: Take 50 mg by mouth 2 (two) times daily as needed for moderate pain. ) 100 tablet 1  . valsartan (DIOVAN) 160 MG  tablet TAKE 1 TABLET (160 MG TOTAL) BY MOUTH DAILY. 90 tablet 1   No current facility-administered medications on file prior to visit.     Allergies  Allergen Reactions  . No Known Allergies     Family History  Problem Relation Age of Onset  . Stroke Mother   . Diabetes Father   . COPD Father   . Arthritis Father   . Heart disease Father   . Coronary artery disease Unknown        Male 1st degree relative <50  . Diabetes Unknown        1st degree relative  . Stroke Unknown        Male 1st degree relative<50    BP 126/64   Pulse 69   Ht 6' (1.829 m)   Wt 228 lb (103.4 kg)   SpO2 94%   BMI 30.92 kg/m    Review of Systems He has gained a few lbs.      Objective:   Physical Exam VITAL SIGNS:  See vs page GENERAL: no distress Pulses: foot pulses are intact bilaterally.   MSK: no deformity of the feet or ankles.  CV: trace bilat edema of the legs.   Skin:  no ulcer on the feet or ankles.  normal color and temp on the feet and ankles Neuro: sensation is intact to touch on the feet and ankles.     Lab Results  Component Value Date   HGBA1C 6.5 01/17/2017      Assessment & Plan:  Type 2 DM, with CVA: well-controlled.  CAD: he needs to avoid hypoglycemia.  Patient Instructions  check your blood sugar once a day.  vary the time of day when you check, between before the 3 meals, and at bedtime.  also check if you have symptoms of your blood sugar being too high or too low.  please keep a record of the readings and bring it to your next appointment here (or you can bring the meter itself).  You can write it on any piece of paper.  please call us sooner if your blood sugar goes below 70, or if you have a lot of readings over 200.   Please continue the same medications.  Please come back for a follow-up appointment in 6 months.

## 2017-01-20 ENCOUNTER — Other Ambulatory Visit (INDEPENDENT_AMBULATORY_CARE_PROVIDER_SITE_OTHER): Payer: 59

## 2017-01-20 ENCOUNTER — Encounter: Payer: Self-pay | Admitting: Internal Medicine

## 2017-01-20 ENCOUNTER — Ambulatory Visit (INDEPENDENT_AMBULATORY_CARE_PROVIDER_SITE_OTHER): Payer: 59 | Admitting: Internal Medicine

## 2017-01-20 DIAGNOSIS — E119 Type 2 diabetes mellitus without complications: Secondary | ICD-10-CM

## 2017-01-20 DIAGNOSIS — I2583 Coronary atherosclerosis due to lipid rich plaque: Secondary | ICD-10-CM | POA: Diagnosis not present

## 2017-01-20 DIAGNOSIS — E785 Hyperlipidemia, unspecified: Secondary | ICD-10-CM | POA: Diagnosis not present

## 2017-01-20 DIAGNOSIS — E1165 Type 2 diabetes mellitus with hyperglycemia: Secondary | ICD-10-CM | POA: Diagnosis not present

## 2017-01-20 DIAGNOSIS — I739 Peripheral vascular disease, unspecified: Secondary | ICD-10-CM | POA: Diagnosis not present

## 2017-01-20 DIAGNOSIS — I2510 Atherosclerotic heart disease of native coronary artery without angina pectoris: Secondary | ICD-10-CM

## 2017-01-20 DIAGNOSIS — E1159 Type 2 diabetes mellitus with other circulatory complications: Secondary | ICD-10-CM

## 2017-01-20 DIAGNOSIS — IMO0002 Reserved for concepts with insufficient information to code with codable children: Secondary | ICD-10-CM

## 2017-01-20 DIAGNOSIS — R252 Cramp and spasm: Secondary | ICD-10-CM

## 2017-01-20 DIAGNOSIS — I5022 Chronic systolic (congestive) heart failure: Secondary | ICD-10-CM

## 2017-01-20 LAB — BASIC METABOLIC PANEL
BUN: 16 mg/dL (ref 6–23)
CHLORIDE: 103 meq/L (ref 96–112)
CO2: 26 meq/L (ref 19–32)
CREATININE: 0.96 mg/dL (ref 0.40–1.50)
Calcium: 9.8 mg/dL (ref 8.4–10.5)
GFR: 84.24 mL/min (ref >60.00)
GLUCOSE: 128 mg/dL — AB (ref 70–99)
POTASSIUM: 4.5 meq/L (ref 3.5–5.1)
Sodium: 137 mEq/L (ref 135–145)

## 2017-01-20 LAB — LIPID PANEL
CHOL/HDL RATIO: 3
Cholesterol: 114 mg/dL (ref 0–200)
HDL: 36.9 mg/dL — AB (ref >39.00)
LDL CALC: 57 mg/dL (ref 0–99)
NONHDL: 76.97
Triglycerides: 100 mg/dL (ref 0.0–149.0)
VLDL: 20 mg/dL (ref 0.0–40.0)

## 2017-01-20 LAB — HEMOGLOBIN A1C: Hgb A1c MFr Bld: 6.6 % — ABNORMAL HIGH (ref 4.6–6.5)

## 2017-01-20 MED ORDER — TELMISARTAN 40 MG PO TABS: 40.0000 mg | ORAL_TABLET | Freq: Every day | ORAL | 3 refills | Status: DC

## 2017-01-20 NOTE — Patient Instructions (Signed)
Hold Crestor for 1-2 weeks, then re-start every other day Try CoQ10 for cramps

## 2017-01-20 NOTE — Assessment & Plan Note (Signed)
Hold Crestor for 1-2 weeks, then re-start every other day Try CoQ10 for cramps

## 2017-01-20 NOTE — Progress Notes (Signed)
Subjective:  Patient ID: [name], male    DOB: [birth year]  Age: 61 y.o. MRN: [id number]  CC: No chief complaint on file.   HPI [name] presents for DM, HTN, CAD C/o leg cramps - worse in the LLE  Outpatient Medications Prior to Visit  Medication Sig Dispense Refill  . aspirin 81 MG EC tablet Take 81 mg by mouth daily.      . bromocriptine (PARLODEL) 2.5 MG tablet 1/2 tab daily (Patient taking differently: Take 1.25 mg by mouth daily. 1/2 tab daily) 45 tablet 3  . canagliflozin (INVOKANA) 100 MG TABS tablet Take 1 tablet (100 mg total) by mouth daily. 90 tablet 3  . carvedilol (COREG) 25 MG tablet TAKE 1 TABLET (25 MG TOTAL) BY MOUTH 2 (TWO) TIMES DAILY WITH A MEAL. 180 tablet 1  . Cholecalciferol (VITAMIN D3) 1000 UNITS tablet Take 1,000 Units by mouth daily.      Marland Kitchen glucose blood (ONETOUCH VERIO) test strip Use as instructed 50 each 11  . ibuprofen (ADVIL,MOTRIN) 600 MG tablet Take 1 tablet (600 mg total) by mouth as needed. 100 tablet 3  . KOMBIGLYZE XR 11-998 MG TB24 TAKE 1 TABLET EVERY DAY BEFORE BREAKFAST 90 tablet 3  . ONETOUCH DELICA LANCETS 14G MISC USE AS DIRECTED AS NEEDED 100 each 3  . pantoprazole (PROTONIX) 40 MG tablet TAKE 1 TABLET (40 MG TOTAL) BY MOUTH DAILY. 90 tablet 3  . rosuvastatin (CRESTOR) 40 MG tablet TAKE 1 TABLET EVERY DAY 90 tablet 3  . spironolactone (ALDACTONE) 25 MG tablet Take 0.5 tablets (12.5 mg total) by mouth daily. 15 tablet 10  . traMADol (ULTRAM) 50 MG tablet Take 1-2 tablets (50-100 mg total) by mouth 2 (two) times daily as needed. (Patient taking differently: Take 50 mg by mouth 2 (two) times daily as needed for moderate pain. ) 100 tablet 1  . valsartan (DIOVAN) 160 MG tablet TAKE 1 TABLET (160 MG TOTAL) BY MOUTH DAILY. 90 tablet 1   No facility-administered medications prior to visit.     ROS Review of Systems  Constitutional: Negative for appetite change, fatigue and unexpected weight change.  HENT: Negative for congestion,  nosebleeds, sneezing, sore throat and trouble swallowing.   Eyes: Negative for itching and visual disturbance.  Respiratory: Negative for cough.   Cardiovascular: Negative for chest pain, palpitations and leg swelling.  Gastrointestinal: Negative for abdominal distention, blood in stool, diarrhea and nausea.  Genitourinary: Negative for frequency and hematuria.  Musculoskeletal: Positive for arthralgias, back pain and myalgias. Negative for gait problem, joint swelling and neck pain.  Skin: Negative for rash.  Neurological: Negative for dizziness, tremors, speech difficulty and weakness.  Psychiatric/Behavioral: Negative for agitation, dysphoric mood and sleep disturbance. The patient is not nervous/anxious.     Objective:  BP 116/64 (BP Location: Left Arm, Patient Position: Sitting, Cuff Size: Large)   Pulse (!) 57   Temp 97.6 F (36.4 C) (Oral)   Ht 6' (1.829 m)   Wt 227 lb (103 kg)   SpO2 98%   BMI 30.79 kg/m   BP Readings from Last 3 Encounters:  01/20/17 116/64  01/17/17 126/64  11/16/16 108/72    Wt Readings from Last 3 Encounters:  01/20/17 227 lb (103 kg)  01/17/17 228 lb (103.4 kg)  11/16/16 223 lb (101.2 kg)    Physical Exam  Constitutional: He is oriented to person, place, and time. He appears well-developed. No distress.  NAD  HENT:  Mouth/Throat: Oropharynx is clear  and moist.  Eyes: Pupils are equal, round, and reactive to light. Conjunctivae are normal.  Neck: Normal range of motion. No JVD present. No thyromegaly present.  Cardiovascular: Normal rate, regular rhythm, normal heart sounds and intact distal pulses.  Exam reveals no gallop and no friction rub.   No murmur heard. Pulmonary/Chest: Effort normal and breath sounds normal. No respiratory distress. He has no wheezes. He has no rales. He exhibits no tenderness.  Abdominal: Soft. Bowel sounds are normal. He exhibits no distension and no mass. There is no tenderness. There is no rebound and no  guarding.  Musculoskeletal: Normal range of motion. He exhibits tenderness. He exhibits no edema.  Lymphadenopathy:    He has no cervical adenopathy.  Neurological: He is alert and oriented to person, place, and time. He has normal reflexes. No cranial nerve deficit. He exhibits normal muscle tone. He displays a negative Romberg sign. Coordination and gait normal.  Skin: Skin is warm and dry. No rash noted.  Psychiatric: He has a normal mood and affect. His behavior is normal. Judgment and thought content normal.  R neck scar  Lab Results  Component Value Date   WBC 10.1 11/06/2016   HGB 13.3 11/06/2016   HCT 40.5 11/06/2016   PLT 126 (L) 11/06/2016   GLUCOSE 112 (H) 11/06/2016   CHOL 104 01/16/2016   TRIG 84.0 01/16/2016   HDL 34.30 (L) 01/16/2016   LDLDIRECT 198.1 02/08/2013   LDLCALC 53 01/16/2016   ALT 23 11/02/2016   AST 22 11/02/2016   NA 137 11/06/2016   K 3.8 11/06/2016   CL 101 11/06/2016   CREATININE 0.70 11/06/2016   BUN 13 11/06/2016   CO2 25 11/06/2016   TSH 1.70 09/24/2015   PSA 0.80 02/08/2013   INR 1.07 11/02/2016   HGBA1C 6.5 01/17/2017    No results found.  Assessment & Plan:   There are no diagnoses linked to this encounter. I am having [name] maintain his aspirin, cholecalciferol, ibuprofen, glucose blood, canagliflozin, traMADol, ONETOUCH DELICA LANCETS 75T, rosuvastatin, bromocriptine, pantoprazole, carvedilol, KOMBIGLYZE XR, spironolactone, and valsartan.  No orders of the defined types were placed in this encounter.    Follow-up: No Follow-up on file.  [name], MD

## 2017-01-20 NOTE — Assessment & Plan Note (Signed)
HDL

## 2017-01-20 NOTE — Assessment & Plan Note (Signed)
Kombiglyze  Invokana Bromocryptine

## 2017-01-20 NOTE — Assessment & Plan Note (Signed)
S/p R carotid endarterectomy

## 2017-01-20 NOTE — Assessment & Plan Note (Signed)
Coreg , Diovan - d/c, Aldactone Start Micardis

## 2017-01-20 NOTE — Assessment & Plan Note (Signed)
Start Micardis, d/c Diovan

## 2017-01-24 ENCOUNTER — Telehealth: Payer: Self-pay | Admitting: Internal Medicine

## 2017-01-24 NOTE — Telephone Encounter (Signed)
Pt called back with fax number for the note for work  Please fax to [phone]

## 2017-01-24 NOTE — Telephone Encounter (Signed)
Note faxed.

## 2017-01-24 NOTE — Telephone Encounter (Signed)
Patient needs a note for work. He was here last week on 7/19. He wanted to know if we could fax over this letter for him.  He is going to call back with the fax number.

## 2017-02-26 ENCOUNTER — Other Ambulatory Visit: Payer: Self-pay | Admitting: Internal Medicine

## 2017-03-09 ENCOUNTER — Telehealth: Payer: Self-pay

## 2017-03-09 NOTE — Telephone Encounter (Signed)
Patient has scheduled nurse visit to get 1st injection shingrix---both injections placed in cindy's refrigerator with patient labels--patient advised of extreme soreness with vaccine and to make another appt to get 2nd shingrix in 2-4 months from first injection date

## 2017-03-10 ENCOUNTER — Ambulatory Visit (INDEPENDENT_AMBULATORY_CARE_PROVIDER_SITE_OTHER): Payer: 59 | Admitting: General Practice

## 2017-03-10 DIAGNOSIS — Z23 Encounter for immunization: Secondary | ICD-10-CM | POA: Diagnosis not present

## 2017-03-17 ENCOUNTER — Other Ambulatory Visit: Payer: Self-pay | Admitting: Internal Medicine

## 2017-03-29 ENCOUNTER — Other Ambulatory Visit: Payer: Self-pay

## 2017-03-29 MED ORDER — CANAGLIFLOZIN 100 MG PO TABS: 100.0000 mg | ORAL_TABLET | Freq: Every day | ORAL | 1 refills | Status: DC

## 2017-04-10 ENCOUNTER — Other Ambulatory Visit: Payer: Self-pay | Admitting: Internal Medicine

## 2017-04-20 DIAGNOSIS — Z23 Encounter for immunization: Secondary | ICD-10-CM | POA: Diagnosis not present

## 2017-05-16 ENCOUNTER — Encounter: Payer: Self-pay | Admitting: Internal Medicine

## 2017-05-16 ENCOUNTER — Ambulatory Visit (INDEPENDENT_AMBULATORY_CARE_PROVIDER_SITE_OTHER): Payer: 59 | Admitting: Internal Medicine

## 2017-05-16 DIAGNOSIS — I10 Essential (primary) hypertension: Secondary | ICD-10-CM | POA: Diagnosis not present

## 2017-05-16 DIAGNOSIS — I6523 Occlusion and stenosis of bilateral carotid arteries: Secondary | ICD-10-CM | POA: Diagnosis not present

## 2017-05-16 DIAGNOSIS — I2510 Atherosclerotic heart disease of native coronary artery without angina pectoris: Secondary | ICD-10-CM | POA: Diagnosis not present

## 2017-05-16 DIAGNOSIS — I5022 Chronic systolic (congestive) heart failure: Secondary | ICD-10-CM

## 2017-05-16 DIAGNOSIS — E1165 Type 2 diabetes mellitus with hyperglycemia: Secondary | ICD-10-CM | POA: Diagnosis not present

## 2017-05-16 MED ORDER — METFORMIN HCL ER 500 MG PO TB24: 500.0000 mg | ORAL_TABLET | Freq: Every day | ORAL | 11 refills | Status: DC

## 2017-05-16 MED ORDER — DAPAGLIFLOZIN PROPANEDIOL 10 MG PO TABS: 10.0000 mg | ORAL_TABLET | Freq: Every day | ORAL | 11 refills | Status: DC

## 2017-05-16 NOTE — Progress Notes (Signed)
Subjective:  Patient ID: [name], male    DOB: 1956/04/01  Age: 61 y.o. MRN: [id number]  CC: No chief complaint on file.   HPI [name] presents for DM, dyslipidemia, PVD, CAD f/u. C/o cost of Kombiglyze and Invokana  Outpatient Medications Prior to Visit  Medication Sig Dispense Refill  . aspirin 81 MG EC tablet Take 81 mg by mouth daily.      . bromocriptine (PARLODEL) 2.5 MG tablet 1/2 tab daily (Patient taking differently: Take 1.25 mg by mouth daily. 1/2 tab daily) 45 tablet 3  . canagliflozin (INVOKANA) 100 MG TABS tablet Take 1 tablet (100 mg total) by mouth daily. 90 tablet 1  . carvedilol (COREG) 25 MG tablet TAKE 1 TABLET (25 MG TOTAL) BY MOUTH 2 (TWO) TIMES DAILY WITH A MEAL. 180 tablet 3  . Cholecalciferol (VITAMIN D3) 1000 UNITS tablet Take 1,000 Units by mouth daily.      Marland Kitchen glucose blood (ONETOUCH VERIO) test strip Use as instructed 50 each 11  . ibuprofen (ADVIL,MOTRIN) 600 MG tablet Take 1 tablet (600 mg total) by mouth as needed. 100 tablet 3  . KOMBIGLYZE XR 11-998 MG TB24 TAKE 1 TABLET EVERY DAY BEFORE BREAKFAST 90 tablet 3  . ONETOUCH DELICA LANCETS 77A MISC USE AS DIRECTED AS NEEDED 100 each 3  . pantoprazole (PROTONIX) 40 MG tablet TAKE 1 TABLET (40 MG TOTAL) BY MOUTH DAILY. 90 tablet 3  . rosuvastatin (CRESTOR) 40 MG tablet Take 1 tablet (40 mg total) by mouth daily. 90 tablet 3  . spironolactone (ALDACTONE) 25 MG tablet Take 0.5 tablets (12.5 mg total) by mouth daily. 15 tablet 10  . telmisartan (MICARDIS) 40 MG tablet Take 1 tablet (40 mg total) by mouth daily. 90 tablet 3  . traMADol (ULTRAM) 50 MG tablet Take 1-2 tablets (50-100 mg total) by mouth 2 (two) times daily as needed. (Patient taking differently: Take 50 mg by mouth 2 (two) times daily as needed for moderate pain. ) 100 tablet 1   No facility-administered medications prior to visit.     ROS Review of Systems  Constitutional: Negative for appetite change, fatigue and unexpected weight  change.  HENT: Negative for congestion, nosebleeds, sneezing, sore throat and trouble swallowing.   Eyes: Negative for itching and visual disturbance.  Respiratory: Negative for cough.   Cardiovascular: Negative for chest pain, palpitations and leg swelling.  Gastrointestinal: Negative for abdominal distention, blood in stool, diarrhea and nausea.  Genitourinary: Negative for frequency and hematuria.  Musculoskeletal: Positive for arthralgias. Negative for back pain, gait problem, joint swelling and neck pain.  Skin: Negative for rash.  Neurological: Negative for dizziness, tremors, speech difficulty and weakness.  Psychiatric/Behavioral: Negative for agitation, dysphoric mood and sleep disturbance. The patient is not nervous/anxious.     Objective:  BP 122/66 (BP Location: Left Arm, Patient Position: Sitting, Cuff Size: Large)   Pulse (!) 56   Temp 97.8 F (36.6 C) (Oral)   Ht 6' (1.829 m)   Wt 228 lb (103.4 kg)   SpO2 98%   BMI 30.92 kg/m   BP Readings from Last 3 Encounters:  05/16/17 122/66  01/20/17 116/64  01/17/17 126/64    Wt Readings from Last 3 Encounters:  05/16/17 228 lb (103.4 kg)  01/20/17 227 lb (103 kg)  01/17/17 228 lb (103.4 kg)    Physical Exam  Constitutional: He is oriented to person, place, and time. He appears well-developed. No distress.  NAD  HENT:  Mouth/Throat: Oropharynx  is clear and moist.  Eyes: Conjunctivae are normal. Pupils are equal, round, and reactive to light.  Neck: Normal range of motion. No JVD present. No thyromegaly present.  Cardiovascular: Normal rate, regular rhythm, normal heart sounds and intact distal pulses. Exam reveals no gallop and no friction rub.  No murmur heard. Pulmonary/Chest: Effort normal and breath sounds normal. No respiratory distress. He has no wheezes. He has no rales. He exhibits no tenderness.  Abdominal: Soft. Bowel sounds are normal. He exhibits no distension and no mass. There is no tenderness. There  is no rebound and no guarding.  Musculoskeletal: Normal range of motion. He exhibits no edema or tenderness.  Lymphadenopathy:    He has no cervical adenopathy.  Neurological: He is alert and oriented to person, place, and time. He has normal reflexes. No cranial nerve deficit. He exhibits normal muscle tone. He displays a negative Romberg sign. Coordination and gait normal.  Skin: Skin is warm and dry. No rash noted.  Psychiatric: He has a normal mood and affect. His behavior is normal. Judgment and thought content normal.    Lab Results  Component Value Date   WBC 10.1 11/06/2016   HGB 13.3 11/06/2016   HCT 40.5 11/06/2016   PLT 126 (L) 11/06/2016   GLUCOSE 128 (H) 01/20/2017   CHOL 114 01/20/2017   TRIG 100.0 01/20/2017   HDL 36.90 (L) 01/20/2017   LDLDIRECT 198.1 02/08/2013   LDLCALC 57 01/20/2017   ALT 23 11/02/2016   AST 22 11/02/2016   NA 137 01/20/2017   K 4.5 01/20/2017   CL 103 01/20/2017   CREATININE 0.96 01/20/2017   BUN 16 01/20/2017   CO2 26 01/20/2017   TSH 1.70 09/24/2015   PSA 0.80 02/08/2013   INR 1.07 11/02/2016   HGBA1C 6.6 (H) 01/20/2017    No results found.  Assessment & Plan:   There are no diagnoses linked to this encounter. I am having [name] maintain his aspirin, cholecalciferol, ibuprofen, glucose blood, traMADol, ONETOUCH DELICA LANCETS 16X, bromocriptine, pantoprazole, KOMBIGLYZE XR, spironolactone, telmisartan, carvedilol, canagliflozin, and rosuvastatin.  No orders of the defined types were placed in this encounter.    Follow-up: No Follow-up on file.  [name], MD

## 2017-05-16 NOTE — Assessment & Plan Note (Signed)
ASA, Coreg, Crestor

## 2017-05-16 NOTE — Assessment & Plan Note (Addendum)
Aldactone Micardis Coreg

## 2017-05-16 NOTE — Assessment & Plan Note (Signed)
Kombiglyze - too $$$ - change to Metformin XR Invokana - too $$ - d/c Bromocryptine   change to [place]

## 2017-05-16 NOTE — Assessment & Plan Note (Signed)
Coreg, Micardis, Aldactone

## 2017-05-28 ENCOUNTER — Other Ambulatory Visit: Payer: Self-pay | Admitting: Internal Medicine

## 2017-05-28 ENCOUNTER — Other Ambulatory Visit: Payer: Self-pay | Admitting: Endocrinology

## 2017-05-31 ENCOUNTER — Encounter: Payer: Self-pay | Admitting: Vascular Surgery

## 2017-05-31 ENCOUNTER — Ambulatory Visit (INDEPENDENT_AMBULATORY_CARE_PROVIDER_SITE_OTHER): Payer: 59 | Admitting: Vascular Surgery

## 2017-05-31 ENCOUNTER — Ambulatory Visit (HOSPITAL_COMMUNITY)
Admission: RE | Admit: 2017-05-31 | Discharge: 2017-05-31 | Disposition: A | Discharge status: Home / Self Care | Payer: 59 | Source: Ambulatory Visit | Attending: Vascular Surgery | Admitting: Vascular Surgery

## 2017-05-31 VITALS — BP 145/82 | HR 58 | Temp 99.2°F | Resp 16 | Ht 72.0 in | Wt 228.0 lb

## 2017-05-31 DIAGNOSIS — I6522 Occlusion and stenosis of left carotid artery: Secondary | ICD-10-CM | POA: Diagnosis not present

## 2017-05-31 DIAGNOSIS — I6523 Occlusion and stenosis of bilateral carotid arteries: Secondary | ICD-10-CM

## 2017-05-31 LAB — VAS US CAROTID
LEFT VERTEBRAL DIAS: 19 cm/s
LICADDIAS: -24 cm/s
LICAPDIAS: -18 cm/s
LICAPSYS: -38 cm/s
Left ICA dist sys: -56 cm/s
RIGHT CCA MID DIAS: 30 cm/s
RIGHT ECA DIAS: -26 cm/s
RIGHT VERTEBRAL DIAS: 17 cm/s
Right CCA prox dias: 29 cm/s
Right CCA prox sys: 115 cm/s
Right cca dist sys: -131 cm/s

## 2017-05-31 NOTE — Progress Notes (Signed)
HISTORY AND PHYSICAL     CC:  S/p right CEA Requesting Provider:  Cassandria Anger, MD  HPI: This is a 61 y.o. male who was seen for evaluation for carotid disease back in May.  He was found to have a progression in his right carotid artery stenosis.  He was asymptomatic.  He was taken to the operating room on 11/05/16 and underwent a right carotid endarterectomy.  He was discharged home on POD 1.  He presents today for 6 month follow up.  He denies any issues of visual disturbances, speech difficulties or unilateral weakness or numbness.  He takes a daily aspirin.  He is on an ARB and beta blocker for blood pressure management.  He is on oral agent for DM.    Past Medical History:  Diagnosis Date  . Aortic valve sclerosis    aortic valve sclerosis  . Arthritis   . CAD (coronary artery disease)    Myoview 5/18: 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 no ischemia EF 27%  . Carotid artery disease ([place])    Doppler, July, 2011, stable, chronic total occlusion LCCA, retrograde flow from LECA to LICA, ,, 1-93%XTKW,,, 40-97% RICA  . Diabetes mellitus without complication ([place])   . Dizziness   . Ejection fraction < 50%    EF 40%, echo 2006, no ICD needed  / EF 40%, echo, 11/2009  . Former cigarette smoker   . 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 Hyperlipidemia    Low HDL  . Hypertension   . Mitral regurgitation    mild, echo, 11/2009  . Myocardial infarction ([place])   . Peripheral vascular disease ([place])    Dr. Erskin Burnet  . Sleep disorder    Dr Gwenette Greet, lose weight and consider sleep study later  . TIA (transient ischemic attack)    by history  . Volume overload     Past Surgical History:  Procedure Laterality Date  . CHOLECYSTECTOMY    . COLONOSCOPY  03/19/2009  . CORONARY ARTERY BYPASS GRAFT    . ENDARTERECTOMY Right 11/05/2016   Procedure: ENDARTERECTOMY RIGHT CAROTID;  Surgeon: [name], MD;  Location: [facility];  Service: Vascular;  Laterality: Right;  . PATCH  ANGIOPLASTY Right 11/05/2016   Procedure: RIGHT CAROTID ARTERY PATCH ANGIOPLASTY USING HEMASHIELD PLATINUM FINESSE PATCH;  Surgeon: [name], MD;  Location: [facility];  Service: Vascular;  Laterality: Right;    Allergies  Allergen Reactions  . No Known Allergies     Current Outpatient Medications  Medication Sig Dispense Refill  . aspirin 81 MG EC tablet Take 81 mg by mouth daily.      . bromocriptine (PARLODEL) 2.5 MG tablet TAKE ONE-HALF TABLET DAILY 45 tablet 2  . carvedilol (COREG) 25 MG tablet TAKE 1 TABLET (25 MG TOTAL) BY MOUTH 2 (TWO) TIMES DAILY WITH A MEAL. 180 tablet 3  . Cholecalciferol (VITAMIN D3) 1000 UNITS tablet Take 1,000 Units by mouth daily.      . dapagliflozin propanediol (FARXIGA) 10 MG TABS tablet Take 10 mg daily by mouth. 30 tablet 11  . glucose blood (ONETOUCH VERIO) test strip Use as instructed 50 each 11  . ibuprofen (ADVIL,MOTRIN) 600 MG tablet Take 1 tablet (600 mg total) by mouth as needed. 100 tablet 3  . metFORMIN (GLUCOPHAGE-XR) 500 MG 24 hr tablet Take 1 tablet (500 mg total) daily with breakfast by mouth. 60 tablet 11  . ONETOUCH DELICA LANCETS 35H MISC USE  AS DIRECTED AS NEEDED 100 each 3  . pantoprazole (PROTONIX) 40 MG tablet TAKE 1 TABLET (40 MG TOTAL) BY MOUTH DAILY. 90 tablet 3  . rosuvastatin (CRESTOR) 40 MG tablet Take 1 tablet (40 mg total) by mouth daily. 90 tablet 3  . spironolactone (ALDACTONE) 25 MG tablet Take 0.5 tablets (12.5 mg total) by mouth daily. 15 tablet 10  . telmisartan (MICARDIS) 40 MG tablet Take 1 tablet (40 mg total) by mouth daily. 90 tablet 3  . traMADol (ULTRAM) 50 MG tablet Take 1-2 tablets (50-100 mg total) by mouth 2 (two) times daily as needed. (Patient taking differently: Take 50 mg by mouth 2 (two) times daily as needed for moderate pain. ) 100 tablet 1   No current facility-administered medications for this visit.     Family History  Problem Relation Age of Onset  . Stroke Mother   . Diabetes Father   . COPD  Father   . Arthritis Father   . Heart disease Father   . Coronary artery disease Unknown        Male 1st degree relative <50  . Diabetes Unknown        1st degree relative  . Stroke Unknown        Male 1st degree relative<50    Social History   Socioeconomic History  . Marital status: Married    Spouse name: Not on file  . Number of children: Not on file  . Years of education: Not on file  . Highest education level: Not on file  Social Needs  . Financial resource strain: Not on file  . Food insecurity - worry: Not on file  . Food insecurity - inability: Not on file  . Transportation needs - medical: Not on file  . Transportation needs - non-medical: Not on file  Occupational History  . Not on file  Tobacco Use  . Smoking status: Former Smoker    Last attempt to quit: 05/05/2002    Years since quitting: 15.0  . Smokeless tobacco: Never Used  Substance and Sexual Activity  . Alcohol use: No    Alcohol/week: 0.0 oz  . Drug use: No  . Sexual activity: Yes  Other Topics Concern  . Not on file  Social History Narrative   No Regular exercise     REVIEW OF SYSTEMS:   [X]  denotes positive finding, [ ]  denotes negative finding Cardiac  Comments:  Chest pain or chest pressure:    Shortness of breath upon exertion:    Short of breath when lying flat:    Irregular heart rhythm:        Vascular    Pain in calf, thigh, or hip brought on by ambulation:    Pain in feet at night that wakes you up from your sleep:     Blood clot in your veins:    Leg swelling:         Pulmonary    Oxygen at home:    Productive cough:     Wheezing:         Neurologic    Sudden weakness in arms or legs:     Sudden numbness in arms or legs:     Sudden onset of difficulty speaking or slurred speech:    Temporary loss of vision in one eye:     Problems with dizziness:         Gastrointestinal    Blood in stool:     Vomited blood:  Genitourinary    Burning when urinating:       Blood in urine:        Psychiatric    Major depression:         Hematologic    Bleeding problems:    Problems with blood clotting too easily:        Skin    Rashes or ulcers:        Constitutional    Fever or chills:      PHYSICAL EXAMINATION:  Vitals:   05/31/17 1436 05/31/17 1438  BP: 122/73 (!) 145/82  Pulse: (!) 58   Resp: 16   Temp: 99.2 F (37.3 C)   SpO2: 98%    Body mass index is 30.92 kg/m.  General:  WDWN in NAD; vital signs documented above Gait: Not observed HENT: WNL, normocephalic Pulmonary: normal non-labored breathing , without Rales, rhonchi,  wheezing Cardiac: regular HR, without  Murmurs, rubs or gallops; without carotid bruits Abdomen: soft, NT, no masses Skin: without rashes Vascular Exam/Pulses:  Right Left  DP Unable to palpate  2+ (normal)  PT Unable to palpate  2+ (normal)   Extremities: without ischemic changes, without Gangrene , without cellulitis; without open wounds;  Musculoskeletal: no muscle wasting or atrophy  Neurologic: A&O X 3;  No focal weakness or paresthesias are detected Psychiatric:  The pt has Normal affect.   Non-Invasive Vascular Imaging:   Carotid Duplex on 05/31/17: Patent right CEA site with evidence of mild hyperplasia in the surgical bulb Known left CCA occlusion Retrograde left ECA feeding the ICA   Pt meds includes: Statin:  Yes.   Beta Blocker:  Yes.   Aspirin:  Yes.   ACEI:  No. ARB:  Yes.   CCB use:  No Other Antiplatelet/Anticoagulant:  No   ASSESSMENT/PLAN:: 61 y.o. male who is s/p right carotid endarterectomy 11/05/16 here for follow up carotid duplex.   -pt doing well and has been asymptomatic since surgery -he does have some mild evidence of intimal hyperplasia in the right ICA-will have him f/u in 6 months with carotid duplex to re-evaluate.  He will call before then if he has any issues.   Leontine Locket, PA-C [facility] [phone]  Clinic MD:  Pt seen and  examined with Dr. Donnetta Hutching  I have examined the patient, reviewed and agree with above.  Curt Jews, MD 05/31/2017 3:17 PM

## 2017-06-01 NOTE — Addendum Note (Signed)
Addended by: Lianne Cure A on: 06/01/2017 09:56 AM   Modules accepted: Orders

## 2017-06-23 ENCOUNTER — Telehealth: Payer: Self-pay | Admitting: Internal Medicine

## 2017-06-23 NOTE — Telephone Encounter (Signed)
Will start PA.

## 2017-06-23 NOTE — Telephone Encounter (Signed)
[place]

## 2017-06-23 NOTE — Telephone Encounter (Signed)
Copied from [place] [phone]. Topic: Quick Communication - See Telephone Encounter >> Jun 23, 2017  9:14 AM Burnis Medin, NT wrote: CRM for notification. See Telephone encounter for: Jerlyn Ly is calling about getting pre authorization for  dapagliflozin propanediol (FARXIGA) 10 MG TABS tablet. He would like a call back        06/23/17.

## 2017-06-30 NOTE — Telephone Encounter (Signed)
KEY: [id number]

## 2017-07-14 NOTE — Telephone Encounter (Signed)
PA denied for Wilder Glade, please advise

## 2017-07-15 NOTE — Telephone Encounter (Signed)
What is covered as preffered? Thx

## 2017-07-20 ENCOUNTER — Ambulatory Visit: Payer: 59 | Admitting: Endocrinology

## 2017-07-20 ENCOUNTER — Encounter: Payer: Self-pay | Admitting: Endocrinology

## 2017-07-20 ENCOUNTER — Ambulatory Visit (INDEPENDENT_AMBULATORY_CARE_PROVIDER_SITE_OTHER): Payer: 59 | Admitting: Endocrinology

## 2017-07-20 VITALS — BP 132/76 | HR 64 | Wt 234.8 lb

## 2017-07-20 DIAGNOSIS — E1165 Type 2 diabetes mellitus with hyperglycemia: Secondary | ICD-10-CM

## 2017-07-20 LAB — POCT GLYCOSYLATED HEMOGLOBIN (HGB A1C): HEMOGLOBIN A1C: 7.2

## 2017-07-20 MED ORDER — LINAGLIPTIN 5 MG PO TABS: 5.0000 mg | ORAL_TABLET | Freq: Every day | ORAL | 11 refills | Status: DC

## 2017-07-20 NOTE — Progress Notes (Signed)
Subjective:    Patient ID: [name], male    DOB: Mar 22, 1956, 62 y.o.   MRN: [id number]  HPI Pt returns for f/u of diabetes mellitus: DM type: 2 Dx'ed: 7829 Complications: CVA and CAD Therapy: 3 oral meds.  DKA: never Severe hypoglycemia: never Pancreatitis: never Other: he has never been on insulin.   Interval history: no cbg record, but states cbg's are well-controlled.  There is no trend throughout the day.  pt states he feels well in general.  Past Medical History:  Diagnosis Date  . Aortic valve sclerosis    aortic valve sclerosis  . Arthritis   . CAD (coronary artery disease)    Myoview 5/18: 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 no ischemia EF 27%  . Carotid artery disease ([place])    Doppler, July, 2011, stable, chronic total occlusion LCCA, retrograde flow from LECA to LICA, ,, 5-62%ZHYQ,,, 65-78% RICA  . Diabetes mellitus without complication ([place])   . Dizziness   . Ejection fraction < 50%    EF 40%, echo 2006, no ICD needed  / EF 40%, echo, 11/2009  . Former cigarette smoker   . 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 Hyperlipidemia    Low HDL  . Hypertension   . Mitral regurgitation    mild, echo, 11/2009  . Myocardial infarction ([place])   . Peripheral vascular disease ([place])    Dr. Erskin Burnet  . Sleep disorder    Dr Gwenette Greet, lose weight and consider sleep study later  . TIA (transient ischemic attack)    by history  . Volume overload     Past Surgical History:  Procedure Laterality Date  . CHOLECYSTECTOMY    . COLONOSCOPY  03/19/2009  . CORONARY ARTERY BYPASS GRAFT    . ENDARTERECTOMY Right 11/05/2016   Procedure: ENDARTERECTOMY RIGHT CAROTID;  Surgeon: [name], MD;  Location: [facility];  Service: Vascular;  Laterality: Right;  . PATCH ANGIOPLASTY Right 11/05/2016   Procedure: RIGHT CAROTID ARTERY PATCH ANGIOPLASTY USING HEMASHIELD PLATINUM FINESSE PATCH;  Surgeon: [name], MD;  Location[facility];  Service: Vascular;  Laterality: Right;     Social History   Socioeconomic History  . Marital status: Married    Spouse name: Not on file  . Number of children: Not on file  . Years of education: Not on file  . Highest education level: Not on file  Social Needs  . Financial resource strain: Not on file  . Food insecurity - worry: Not on file  . Food insecurity - inability: Not on file  . Transportation needs - medical: Not on file  . Transportation needs - non-medical: Not on file  Occupational History  . Not on file  Tobacco Use  . Smoking status: Former Smoker    Last attempt to quit: 05/05/2002    Years since quitting: 15.2  . Smokeless tobacco: Never Used  Substance and Sexual Activity  . Alcohol use: No    Alcohol/week: 0.0 oz  . Drug use: No  . Sexual activity: Yes  Other Topics Concern  . Not on file  Social History Narrative   No Regular exercise    Current Outpatient Medications on File Prior to Visit  Medication Sig Dispense Refill  . aspirin 81 MG EC tablet Take 81 mg by mouth daily.      . bromocriptine (PARLODEL) 2.5 MG tablet TAKE ONE-HALF TABLET DAILY 45 tablet 2  . carvedilol (COREG) 25  MG tablet TAKE 1 TABLET (25 MG TOTAL) BY MOUTH 2 (TWO) TIMES DAILY WITH A MEAL. 180 tablet 3  . Cholecalciferol (VITAMIN D3) 1000 UNITS tablet Take 1,000 Units by mouth daily.      . dapagliflozin propanediol (FARXIGA) 10 MG TABS tablet Take 10 mg daily by mouth. 30 tablet 11  . glucose blood (ONETOUCH VERIO) test strip Use as instructed 50 each 11  . ibuprofen (ADVIL,MOTRIN) 600 MG tablet Take 1 tablet (600 mg total) by mouth as needed. 100 tablet 3  . metFORMIN (GLUCOPHAGE-XR) 500 MG 24 hr tablet Take 1 tablet (500 mg total) daily with breakfast by mouth. 60 tablet 11  . ONETOUCH DELICA LANCETS 16W MISC USE AS DIRECTED AS NEEDED 100 each 3  . pantoprazole (PROTONIX) 40 MG tablet TAKE 1 TABLET (40 MG TOTAL) BY MOUTH DAILY. 90 tablet 3  . rosuvastatin (CRESTOR) 40 MG tablet Take 1 tablet (40 mg total) by mouth  daily. 90 tablet 3  . spironolactone (ALDACTONE) 25 MG tablet Take 0.5 tablets (12.5 mg total) by mouth daily. 15 tablet 10  . telmisartan (MICARDIS) 40 MG tablet Take 1 tablet (40 mg total) by mouth daily. 90 tablet 3  . traMADol (ULTRAM) 50 MG tablet Take 1-2 tablets (50-100 mg total) by mouth 2 (two) times daily as needed. (Patient taking differently: Take 50 mg by mouth 2 (two) times daily as needed for moderate pain. ) 100 tablet 1   No current facility-administered medications on file prior to visit.     Allergies  Allergen Reactions  . No Known Allergies     Family History  Problem Relation Age of Onset  . Stroke Mother   . Diabetes Father   . COPD Father   . Arthritis Father   . Heart disease Father   . Coronary artery disease Unknown        Male 1st degree relative <50  . Diabetes Unknown        1st degree relative  . Stroke Unknown        Male 1st degree relative<50    BP 132/76 (BP Location: Left Arm, Patient Position: Sitting, Cuff Size: Normal)   Pulse 64   Wt 234 lb 12.8 oz (106.5 kg)   SpO2 97%   BMI 31.84 kg/m    Review of Systems He denies hypoglycemia    Objective:   Physical Exam VITAL SIGNS:  See vs page.  GENERAL: no distress.  Pulses: foot pulses are intact bilaterally.   MSK: no deformity of the feet or ankles.  CV: trace bilat edema of the legs.   Skin:  no ulcer on the feet or ankles.  normal color and temp on the feet and ankles Neuro: sensation is intact to touch on the feet and ankles.   Lab Results  Component Value Date   CREATININE 0.96 01/20/2017   BUN 16 01/20/2017   NA 137 01/20/2017   K 4.5 01/20/2017   CL 103 01/20/2017   CO2 26 01/20/2017   a1c=7.2%     Assessment & Plan:  Type 2 DM, with CAD: he needs increased rx   Patient Instructions  check your blood sugar once a day.  vary the time of day when you check, between before the 3 meals, and at bedtime.  also check if you have symptoms of your blood sugar being too  high or too low.  please keep a record of the readings and bring it to your next appointment here (or you  can bring the meter itself).  You can write it on any piece of paper.  please call us sooner if your blood sugar goes below 70, or if you have a lot of readings over 200.   I have sent a prescription to your pharmacy, to add "tradjenta."   Please come back for a follow-up appointment in 6 months.

## 2017-07-20 NOTE — Patient Instructions (Addendum)
check your blood sugar once a day.  vary the time of day when you check, between before the 3 meals, and at bedtime.  also check if you have symptoms of your blood sugar being too high or too low.  please keep a record of the readings and bring it to your next appointment here (or you can bring the meter itself).  You can write it on any piece of paper.  please call us sooner if your blood sugar goes below 70, or if you have a lot of readings over 200.   I have sent a prescription to your pharmacy, to add "tradjenta."   Please come back for a follow-up appointment in 6 months.

## 2017-07-20 NOTE — Telephone Encounter (Signed)
Faxed pharmacy to see what is preferred

## 2017-08-09 ENCOUNTER — Telehealth: Payer: Self-pay | Admitting: Endocrinology

## 2017-08-09 NOTE — Telephone Encounter (Signed)
Patient needs Dr Loanne Drilling to contact [facility] (possible preauthorization) for Wilder Glade medication-pharmacy is [facility] [address] said Dr needs to contact insurance company-medication not covered

## 2017-08-10 NOTE — Telephone Encounter (Signed)
Please advise on below and if PA is needed

## 2017-08-10 NOTE — Telephone Encounter (Signed)
First I need to know what is alternative?

## 2017-08-11 ENCOUNTER — Other Ambulatory Visit: Payer: Self-pay

## 2017-08-11 MED ORDER — EMPAGLIFLOZIN 25 MG PO TABS: 25.0000 mg | ORAL_TABLET | Freq: Every day | ORAL | 11 refills | Status: DC

## 2017-08-11 NOTE — Telephone Encounter (Signed)
Jardiance or Invokana are in the same class?

## 2017-08-11 NOTE — Telephone Encounter (Signed)
jardiance 25 mg qd please

## 2017-08-11 NOTE — Telephone Encounter (Signed)
I have sent prescription in & notified patient of the change.

## 2017-08-17 ENCOUNTER — Other Ambulatory Visit (INDEPENDENT_AMBULATORY_CARE_PROVIDER_SITE_OTHER): Payer: 59

## 2017-08-17 ENCOUNTER — Encounter: Payer: Self-pay | Admitting: Internal Medicine

## 2017-08-17 ENCOUNTER — Ambulatory Visit (INDEPENDENT_AMBULATORY_CARE_PROVIDER_SITE_OTHER): Payer: 59 | Admitting: Internal Medicine

## 2017-08-17 VITALS — BP 124/74 | HR 58 | Temp 98.0°F | Ht 72.0 in | Wt 233.0 lb

## 2017-08-17 DIAGNOSIS — Z6831 Body mass index (BMI) 31.0-31.9, adult: Secondary | ICD-10-CM | POA: Diagnosis not present

## 2017-08-17 DIAGNOSIS — E6609 Other obesity due to excess calories: Secondary | ICD-10-CM | POA: Diagnosis not present

## 2017-08-17 DIAGNOSIS — Z23 Encounter for immunization: Secondary | ICD-10-CM | POA: Diagnosis not present

## 2017-08-17 DIAGNOSIS — I2583 Coronary atherosclerosis due to lipid rich plaque: Secondary | ICD-10-CM | POA: Diagnosis not present

## 2017-08-17 DIAGNOSIS — I5022 Chronic systolic (congestive) heart failure: Secondary | ICD-10-CM

## 2017-08-17 DIAGNOSIS — E1165 Type 2 diabetes mellitus with hyperglycemia: Secondary | ICD-10-CM | POA: Diagnosis not present

## 2017-08-17 DIAGNOSIS — I2510 Atherosclerotic heart disease of native coronary artery without angina pectoris: Secondary | ICD-10-CM

## 2017-08-17 DIAGNOSIS — I255 Ischemic cardiomyopathy: Secondary | ICD-10-CM

## 2017-08-17 DIAGNOSIS — E119 Type 2 diabetes mellitus without complications: Secondary | ICD-10-CM | POA: Diagnosis not present

## 2017-08-17 LAB — LIPID PANEL
Cholesterol: 117 mg/dL (ref 0–200)
HDL: 34.1 mg/dL — AB (ref >39.00)
LDL CALC: 63 mg/dL (ref 0–99)
NonHDL: 82.63
TRIGLYCERIDES: 99 mg/dL (ref 0.0–149.0)
Total CHOL/HDL Ratio: 3
VLDL: 19.8 mg/dL (ref 0.0–40.0)

## 2017-08-17 LAB — BASIC METABOLIC PANEL
BUN: 16 mg/dL (ref 6–23)
CALCIUM: 9.5 mg/dL (ref 8.4–10.5)
CHLORIDE: 100 meq/L (ref 96–112)
CO2: 26 meq/L (ref 19–32)
CREATININE: 1.02 mg/dL (ref 0.40–1.50)
GFR: 78.4 mL/min (ref >60.00)
GLUCOSE: 125 mg/dL — AB (ref 70–99)
Potassium: 4.6 mEq/L (ref 3.5–5.1)
Sodium: 136 mEq/L (ref 135–145)

## 2017-08-17 LAB — HEMOGLOBIN A1C: Hgb A1c MFr Bld: 7.4 % — ABNORMAL HIGH (ref 4.6–6.5)

## 2017-08-17 NOTE — Assessment & Plan Note (Signed)
F/u w/Dr Ellison 

## 2017-08-17 NOTE — Assessment & Plan Note (Signed)
Coreg ,  Micardis

## 2017-08-17 NOTE — Progress Notes (Signed)
Subjective:  Patient ID: [name], male    DOB: 12-25-1955  Age: 62 y.o. MRN: [id number]  CC: No chief complaint on file.   HPI [name] presents for HTN, OA, DM, CAD f/u   Outpatient Medications Prior to Visit  Medication Sig Dispense Refill  . aspirin 81 MG EC tablet Take 81 mg by mouth daily.      . bromocriptine (PARLODEL) 2.5 MG tablet TAKE ONE-HALF TABLET DAILY 45 tablet 2  . carvedilol (COREG) 25 MG tablet TAKE 1 TABLET (25 MG TOTAL) BY MOUTH 2 (TWO) TIMES DAILY WITH A MEAL. 180 tablet 3  . Cholecalciferol (VITAMIN D3) 1000 UNITS tablet Take 1,000 Units by mouth daily.      . empagliflozin (JARDIANCE) 25 MG TABS tablet Take 25 mg by mouth daily. 30 tablet 11  . glucose blood (ONETOUCH VERIO) test strip Use as instructed 50 each 11  . ibuprofen (ADVIL,MOTRIN) 600 MG tablet Take 1 tablet (600 mg total) by mouth as needed. 100 tablet 3  . linagliptin (TRADJENTA) 5 MG TABS tablet Take 1 tablet (5 mg total) by mouth daily. 30 tablet 11  . metFORMIN (GLUCOPHAGE-XR) 500 MG 24 hr tablet Take 1 tablet (500 mg total) daily with breakfast by mouth. 60 tablet 11  . ONETOUCH DELICA LANCETS 19J MISC USE AS DIRECTED AS NEEDED 100 each 3  . pantoprazole (PROTONIX) 40 MG tablet TAKE 1 TABLET (40 MG TOTAL) BY MOUTH DAILY. 90 tablet 3  . rosuvastatin (CRESTOR) 40 MG tablet Take 1 tablet (40 mg total) by mouth daily. 90 tablet 3  . spironolactone (ALDACTONE) 25 MG tablet Take 0.5 tablets (12.5 mg total) by mouth daily. 15 tablet 10  . telmisartan (MICARDIS) 40 MG tablet Take 1 tablet (40 mg total) by mouth daily. 90 tablet 3  . traMADol (ULTRAM) 50 MG tablet Take 1-2 tablets (50-100 mg total) by mouth 2 (two) times daily as needed. (Patient taking differently: Take 50 mg by mouth 2 (two) times daily as needed for moderate pain. ) 100 tablet 1   No facility-administered medications prior to visit.     ROS Review of Systems  Constitutional: Negative for appetite change, fatigue and  unexpected weight change.  HENT: Negative for congestion, nosebleeds, sneezing, sore throat and trouble swallowing.   Eyes: Negative for itching and visual disturbance.  Respiratory: Negative for cough.   Cardiovascular: Negative for chest pain, palpitations and leg swelling.  Gastrointestinal: Negative for abdominal distention, blood in stool, diarrhea and nausea.  Genitourinary: Negative for frequency and hematuria.  Musculoskeletal: Positive for arthralgias and back pain. Negative for gait problem, joint swelling and neck pain.  Skin: Negative for rash.  Neurological: Negative for dizziness, tremors, speech difficulty and weakness.  Psychiatric/Behavioral: Negative for agitation, dysphoric mood, sleep disturbance and suicidal ideas. The patient is not nervous/anxious.     Objective:  BP 124/74 (BP Location: Left Arm, Patient Position: Sitting, Cuff Size: Large)   Pulse (!) 58   Temp 98 F (36.7 C) (Oral)   Ht 6' (1.829 m)   Wt 233 lb (105.7 kg)   SpO2 98%   BMI 31.60 kg/m   BP Readings from Last 3 Encounters:  08/17/17 124/74  07/20/17 132/76  05/31/17 (!) 145/82    Wt Readings from Last 3 Encounters:  08/17/17 233 lb (105.7 kg)  07/20/17 234 lb 12.8 oz (106.5 kg)  05/31/17 228 lb (103.4 kg)    Physical Exam  Constitutional: He is oriented to person, place, and  time. He appears well-developed. No distress.  NAD  HENT:  Head: Normocephalic and atraumatic.  Eyes: Conjunctivae are normal. Pupils are equal, round, and reactive to light. Right eye exhibits no discharge.  Neck: Normal range of motion.  Cardiovascular: Normal rate.  No murmur heard. Pulmonary/Chest: Effort normal and breath sounds normal. No respiratory distress. He has no wheezes. He has no rales.  Abdominal: He exhibits no mass.  Musculoskeletal: Normal range of motion. He exhibits tenderness. He exhibits no edema.  Neurological: He is alert and oriented to person, place, and time. He has normal  reflexes. He displays a negative Romberg sign. Gait normal.  Skin: Skin is warm and dry.  Psychiatric: He has a normal mood and affect. His behavior is normal. Judgment and thought content normal.    Lab Results  Component Value Date   WBC 10.1 11/06/2016   HGB 13.3 11/06/2016   HCT 40.5 11/06/2016   PLT 126 (L) 11/06/2016   GLUCOSE 128 (H) 01/20/2017   CHOL 114 01/20/2017   TRIG 100.0 01/20/2017   HDL 36.90 (L) 01/20/2017   LDLDIRECT 198.1 02/08/2013   LDLCALC 57 01/20/2017   ALT 23 11/02/2016   AST 22 11/02/2016   NA 137 01/20/2017   K 4.5 01/20/2017   CL 103 01/20/2017   CREATININE 0.96 01/20/2017   BUN 16 01/20/2017   CO2 26 01/20/2017   TSH 1.70 09/24/2015   PSA 0.80 02/08/2013   INR 1.07 11/02/2016   HGBA1C 7.2 07/20/2017    No results found.  Assessment & Plan:   There are no diagnoses linked to this encounter. I am having Lynann Bologna B. Downen maintain his aspirin, cholecalciferol, ibuprofen, glucose blood, traMADol, ONETOUCH DELICA LANCETS 27P, spironolactone, telmisartan, carvedilol, rosuvastatin, metFORMIN, bromocriptine, pantoprazole, linagliptin, and empagliflozin.  No orders of the defined types were placed in this encounter.    Follow-up: No Follow-up on file.  [name], MD

## 2017-08-17 NOTE — Assessment & Plan Note (Signed)
Wt Readings from Last 3 Encounters:  08/17/17 233 lb (105.7 kg)  07/20/17 234 lb 12.8 oz (106.5 kg)  05/31/17 228 lb (103.4 kg)

## 2017-08-17 NOTE — Addendum Note (Signed)
Addended by: Karren Cobble on: 08/17/2017 09:51 AM   Modules accepted: Orders

## 2017-08-17 NOTE — Assessment & Plan Note (Signed)
Coreg , Diovan, Aldactone

## 2017-10-05 DIAGNOSIS — M779 Enthesopathy, unspecified: Secondary | ICD-10-CM | POA: Diagnosis not present

## 2017-10-05 DIAGNOSIS — M79671 Pain in right foot: Secondary | ICD-10-CM | POA: Diagnosis not present

## 2017-10-05 DIAGNOSIS — M7751 Other enthesopathy of right foot: Secondary | ICD-10-CM | POA: Diagnosis not present

## 2017-10-19 DIAGNOSIS — M779 Enthesopathy, unspecified: Secondary | ICD-10-CM | POA: Diagnosis not present

## 2017-10-19 DIAGNOSIS — M79671 Pain in right foot: Secondary | ICD-10-CM | POA: Diagnosis not present

## 2017-11-09 DIAGNOSIS — M779 Enthesopathy, unspecified: Secondary | ICD-10-CM | POA: Diagnosis not present

## 2017-11-09 DIAGNOSIS — M1000 Idiopathic gout, unspecified site: Secondary | ICD-10-CM | POA: Diagnosis not present

## 2017-11-14 ENCOUNTER — Other Ambulatory Visit: Payer: Self-pay | Admitting: Cardiovascular Disease

## 2017-11-29 ENCOUNTER — Ambulatory Visit (INDEPENDENT_AMBULATORY_CARE_PROVIDER_SITE_OTHER): Payer: 59 | Admitting: Vascular Surgery

## 2017-11-29 ENCOUNTER — Other Ambulatory Visit: Payer: Self-pay

## 2017-11-29 ENCOUNTER — Encounter: Payer: Self-pay | Admitting: Vascular Surgery

## 2017-11-29 ENCOUNTER — Ambulatory Visit (HOSPITAL_COMMUNITY)
Admission: RE | Admit: 2017-11-29 | Discharge: 2017-11-29 | Disposition: A | Discharge status: Home / Self Care | Payer: 59 | Source: Ambulatory Visit | Attending: Vascular Surgery | Admitting: Vascular Surgery

## 2017-11-29 VITALS — BP 113/69 | HR 60 | Resp 20 | Ht 72.0 in | Wt 230.5 lb

## 2017-11-29 DIAGNOSIS — I6523 Occlusion and stenosis of bilateral carotid arteries: Secondary | ICD-10-CM

## 2017-11-29 NOTE — Progress Notes (Signed)
[facility]  Patient name: [name] MRN: [id number] DOB: 1956/06/03 Sex: male  REASON FOR VISIT: Follow-up carotid disease  HPI: [name] is a 62 y.o. male here today for follow-up.  He had undergone a right carotid endarterectomy for severe asymptomatic carotid stenosis by myself in May 2018.  He has known left common carotid artery occlusion.  He has remained completely asymptomatic.  He has had no difficulty related to his carotid surgery.  He has been stable from a cardiac standpoint as well.  Past Medical History:  Diagnosis Date  . Aortic valve sclerosis    aortic valve sclerosis  . Arthritis   . CAD (coronary artery disease)    Myoview 5/18: 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 no ischemia EF 27%  . Carotid artery disease ([place])    Doppler, July, 2011, stable, chronic total occlusion LCCA, retrograde flow from LECA to LICA, ,, 7-86%VEHM,,, 09-47% RICA  . Diabetes mellitus without complication ([place])   . Dizziness   . Ejection fraction < 50%    EF 40%, echo 2006, no ICD needed  / EF 40%, echo, 11/2009  . Former cigarette smoker   . 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 Hyperlipidemia    Low HDL  . Hypertension   . Mitral regurgitation    mild, echo, 11/2009  . Myocardial infarction ([place])   . Peripheral vascular disease ([place])    Dr. Erskin Burnet  . Sleep disorder    Dr Gwenette Greet, lose weight and consider sleep study later  . TIA (transient ischemic attack)    by history  . Volume overload     Family History  Problem Relation Age of Onset  . Stroke Mother   . Diabetes Father   . COPD Father   . Arthritis Father   . Heart disease Father   . Coronary artery disease Unknown        Male 1st degree relative <50  . Diabetes Unknown        1st degree relative  . Stroke Unknown        Male 1st degree relative<50    SOCIAL HISTORY: Social History   Tobacco Use  . Smoking status:  Former Smoker    Last attempt to quit: 05/05/2002    Years since quitting: 15.5  . Smokeless tobacco: Never Used  Substance Use Topics  . Alcohol use: No    Alcohol/week: 0.0 oz    Allergies  Allergen Reactions  . No Known Allergies     Current Outpatient Medications  Medication Sig Dispense Refill  . aspirin 81 MG EC tablet Take 81 mg by mouth daily.      . bromocriptine (PARLODEL) 2.5 MG tablet TAKE ONE-HALF TABLET DAILY 45 tablet 2  . carvedilol (COREG) 25 MG tablet TAKE 1 TABLET (25 MG TOTAL) BY MOUTH 2 (TWO) TIMES DAILY WITH A MEAL. 180 tablet 3  . Cholecalciferol (VITAMIN D3) 1000 UNITS tablet Take 1,000 Units by mouth daily.      . empagliflozin (JARDIANCE) 25 MG TABS tablet Take 25 mg by mouth daily. 30 tablet 11  . glucose blood (ONETOUCH VERIO) test strip Use as instructed 50 each 11  . ibuprofen (ADVIL,MOTRIN) 600 MG tablet Take 1 tablet (600 mg total) by mouth as needed. 100 tablet 3  . linagliptin (TRADJENTA) 5 MG TABS tablet Take 1 tablet (5 mg total) by mouth daily. 30 tablet 11  .  metFORMIN (GLUCOPHAGE-XR) 500 MG 24 hr tablet Take 1 tablet (500 mg total) daily with breakfast by mouth. 60 tablet 11  . ONETOUCH DELICA LANCETS 47W MISC USE AS DIRECTED AS NEEDED 100 each 3  . pantoprazole (PROTONIX) 40 MG tablet TAKE 1 TABLET (40 MG TOTAL) BY MOUTH DAILY. 90 tablet 3  . rosuvastatin (CRESTOR) 40 MG tablet Take 1 tablet (40 mg total) by mouth daily. 90 tablet 3  . spironolactone (ALDACTONE) 25 MG tablet TAKE 0.5 TABLETS (12.5 MG TOTAL) BY MOUTH DAILY. 30 tablet 0  . telmisartan (MICARDIS) 40 MG tablet Take 1 tablet (40 mg total) by mouth daily. 90 tablet 3  . traMADol (ULTRAM) 50 MG tablet Take 1-2 tablets (50-100 mg total) by mouth 2 (two) times daily as needed. (Patient taking differently: Take 50 mg by mouth 2 (two) times daily as needed for moderate pain. ) 100 tablet 1   No current facility-administered medications for this visit.     REVIEW OF SYSTEMS:  [X]  denotes  positive finding, [ ]  denotes negative finding Cardiac  Comments:  Chest pain or chest pressure:    Shortness of breath upon exertion:    Short of breath when lying flat:    Irregular heart rhythm:        Vascular    Pain in calf, thigh, or hip brought on by ambulation:    Pain in feet at night that wakes you up from your sleep:     Blood clot in your veins:    Leg swelling:           PHYSICAL EXAM: Vitals:   11/29/17 1104 11/29/17 1106  BP: 122/73 113/69  Pulse: 60   Resp: 20   SpO2: 95%   Weight: 230 lb 8 oz (104.6 kg)   Height: 6' (1.829 m)     GENERAL: The patient is a well-nourished male, in no acute distress. The vital signs are documented above. CARDIOVASCULAR: Right carotid incision well-healed with no bruit.  No left carotid bruit PULMONARY: There is good air exchange  MUSCULOSKELETAL: There are no major deformities or cyanosis. NEUROLOGIC: No focal weakness or paresthesias are detected. SKIN: There are no ulcers or rashes noted. PSYCHIATRIC: The patient has a normal affect.  DATA:  Duplex today reveals known occlusion of his common carotid artery.  He does have no restenosis in his right endarterectomy site  MEDICAL ISSUES: Stable 1 year out from carotid surgery.  He will continue full activities.  We will see him again in 1 year with repeat carotid duplex.  He will notify us or present to the emergency room he have any neurologic deficits    [name], MD [facility] Tel [phone] Pager [phone]

## 2017-11-30 DIAGNOSIS — M779 Enthesopathy, unspecified: Secondary | ICD-10-CM | POA: Diagnosis not present

## 2017-11-30 DIAGNOSIS — M1000 Idiopathic gout, unspecified site: Secondary | ICD-10-CM | POA: Diagnosis not present

## 2017-12-16 ENCOUNTER — Ambulatory Visit (INDEPENDENT_AMBULATORY_CARE_PROVIDER_SITE_OTHER): Payer: 59 | Admitting: Internal Medicine

## 2017-12-16 ENCOUNTER — Encounter: Payer: Self-pay | Admitting: Internal Medicine

## 2017-12-16 ENCOUNTER — Other Ambulatory Visit (INDEPENDENT_AMBULATORY_CARE_PROVIDER_SITE_OTHER): Payer: 59

## 2017-12-16 DIAGNOSIS — I2510 Atherosclerotic heart disease of native coronary artery without angina pectoris: Secondary | ICD-10-CM

## 2017-12-16 DIAGNOSIS — G8929 Other chronic pain: Secondary | ICD-10-CM | POA: Diagnosis not present

## 2017-12-16 DIAGNOSIS — I2583 Coronary atherosclerosis due to lipid rich plaque: Secondary | ICD-10-CM

## 2017-12-16 DIAGNOSIS — I5022 Chronic systolic (congestive) heart failure: Secondary | ICD-10-CM | POA: Diagnosis not present

## 2017-12-16 DIAGNOSIS — I739 Peripheral vascular disease, unspecified: Secondary | ICD-10-CM | POA: Diagnosis not present

## 2017-12-16 DIAGNOSIS — E119 Type 2 diabetes mellitus without complications: Secondary | ICD-10-CM | POA: Diagnosis not present

## 2017-12-16 DIAGNOSIS — G459 Transient cerebral ischemic attack, unspecified: Secondary | ICD-10-CM | POA: Diagnosis not present

## 2017-12-16 DIAGNOSIS — E785 Hyperlipidemia, unspecified: Secondary | ICD-10-CM | POA: Diagnosis not present

## 2017-12-16 DIAGNOSIS — I10 Essential (primary) hypertension: Secondary | ICD-10-CM | POA: Diagnosis not present

## 2017-12-16 DIAGNOSIS — M25511 Pain in right shoulder: Secondary | ICD-10-CM

## 2017-12-16 LAB — BASIC METABOLIC PANEL
BUN: 14 mg/dL (ref 6–23)
CO2: 25 mEq/L (ref 19–32)
CREATININE: 1.07 mg/dL (ref 0.40–1.50)
Calcium: 9.5 mg/dL (ref 8.4–10.5)
Chloride: 102 mEq/L (ref 96–112)
GFR: 74.11 mL/min (ref >60.00)
GLUCOSE: 134 mg/dL — AB (ref 70–99)
POTASSIUM: 4.6 meq/L (ref 3.5–5.1)
Sodium: 136 mEq/L (ref 135–145)

## 2017-12-16 LAB — LIPID PANEL
CHOL/HDL RATIO: 3
Cholesterol: 123 mg/dL (ref 0–200)
HDL: 36.3 mg/dL — AB (ref >39.00)
LDL Cholesterol: 66 mg/dL (ref 0–99)
NonHDL: 87.14
TRIGLYCERIDES: 104 mg/dL (ref 0.0–149.0)
VLDL: 20.8 mg/dL (ref 0.0–40.0)

## 2017-12-16 LAB — HEMOGLOBIN A1C: Hgb A1c MFr Bld: 7.1 % — ABNORMAL HIGH (ref 4.6–6.5)

## 2017-12-16 NOTE — Assessment & Plan Note (Signed)
-   Crestor 

## 2017-12-16 NOTE — Progress Notes (Signed)
Subjective:  Patient ID: [name], male    DOB: [birth year]  Age: 62 y.o. MRN: [id number]  CC: No chief complaint on file.   HPI [name] presents for DM, CAD, HTN f/u  Outpatient Medications Prior to Visit  Medication Sig Dispense Refill  . aspirin 81 MG EC tablet Take 81 mg by mouth daily.      . bromocriptine (PARLODEL) 2.5 MG tablet TAKE ONE-HALF TABLET DAILY 45 tablet 2  . carvedilol (COREG) 25 MG tablet TAKE 1 TABLET (25 MG TOTAL) BY MOUTH 2 (TWO) TIMES DAILY WITH A MEAL. 180 tablet 3  . Cholecalciferol (VITAMIN D3) 1000 UNITS tablet Take 1,000 Units by mouth daily.      . empagliflozin (JARDIANCE) 25 MG TABS tablet Take 25 mg by mouth daily. 30 tablet 11  . glucose blood (ONETOUCH VERIO) test strip Use as instructed 50 each 11  . ibuprofen (ADVIL,MOTRIN) 600 MG tablet Take 1 tablet (600 mg total) by mouth as needed. 100 tablet 3  . linagliptin (TRADJENTA) 5 MG TABS tablet Take 1 tablet (5 mg total) by mouth daily. 30 tablet 11  . metFORMIN (GLUCOPHAGE-XR) 500 MG 24 hr tablet Take 1 tablet (500 mg total) daily with breakfast by mouth. 60 tablet 11  . ONETOUCH DELICA LANCETS 69C MISC USE AS DIRECTED AS NEEDED 100 each 3  . pantoprazole (PROTONIX) 40 MG tablet TAKE 1 TABLET (40 MG TOTAL) BY MOUTH DAILY. 90 tablet 3  . rosuvastatin (CRESTOR) 40 MG tablet Take 1 tablet (40 mg total) by mouth daily. 90 tablet 3  . spironolactone (ALDACTONE) 25 MG tablet TAKE 0.5 TABLETS (12.5 MG TOTAL) BY MOUTH DAILY. 30 tablet 0  . telmisartan (MICARDIS) 40 MG tablet Take 1 tablet (40 mg total) by mouth daily. 90 tablet 3  . traMADol (ULTRAM) 50 MG tablet Take 1-2 tablets (50-100 mg total) by mouth 2 (two) times daily as needed. (Patient taking differently: Take 50 mg by mouth 2 (two) times daily as needed for moderate pain. ) 100 tablet 1   No facility-administered medications prior to visit.     ROS: Review of Systems  Constitutional: Negative for appetite change, fatigue and  unexpected weight change.  HENT: Negative for congestion, nosebleeds, sneezing, sore throat and trouble swallowing.   Eyes: Negative for itching and visual disturbance.  Respiratory: Negative for cough.   Cardiovascular: Negative for chest pain, palpitations and leg swelling.  Gastrointestinal: Negative for abdominal distention, blood in stool, diarrhea and nausea.  Genitourinary: Negative for frequency and hematuria.  Musculoskeletal: Negative for back pain, gait problem, joint swelling and neck pain.  Skin: Negative for rash.  Neurological: Negative for dizziness, tremors, speech difficulty and weakness.  Psychiatric/Behavioral: Negative for agitation, dysphoric mood, sleep disturbance and suicidal ideas. The patient is nervous/anxious.     Objective:  BP 112/66 (BP Location: Left Arm, Patient Position: Sitting, Cuff Size: Large)   Pulse (!) 53   Temp 97.9 F (36.6 C) (Oral)   Ht 6' (1.829 m)   Wt 232 lb (105.2 kg)   SpO2 98%   BMI 31.46 kg/m   BP Readings from Last 3 Encounters:  12/16/17 112/66  11/29/17 113/69  08/17/17 124/74    Wt Readings from Last 3 Encounters:  12/16/17 232 lb (105.2 kg)  11/29/17 230 lb 8 oz (104.6 kg)  08/17/17 233 lb (105.7 kg)    Physical Exam  Constitutional: He is oriented to person, place, and time. He appears well-developed. No distress.  NAD  HENT:  Mouth/Throat: Oropharynx is clear and moist.  Eyes: Pupils are equal, round, and reactive to light. Conjunctivae are normal.  Neck: Normal range of motion. No JVD present. No thyromegaly present.  Cardiovascular: Normal rate, regular rhythm, normal heart sounds and intact distal pulses. Exam reveals no gallop and no friction rub.  No murmur heard. Pulmonary/Chest: Effort normal and breath sounds normal. No respiratory distress. He has no wheezes. He has no rales. He exhibits no tenderness.  Abdominal: Soft. Bowel sounds are normal. He exhibits no distension and no mass. There is no  tenderness. There is no rebound and no guarding.  Musculoskeletal: Normal range of motion. He exhibits no edema or tenderness.  Lymphadenopathy:    He has no cervical adenopathy.  Neurological: He is alert and oriented to person, place, and time. He has normal reflexes. No cranial nerve deficit. He exhibits normal muscle tone. He displays a negative Romberg sign. Coordination and gait normal.  Skin: Skin is warm and dry. No rash noted.  Psychiatric: He has a normal mood and affect. His behavior is normal. Judgment and thought content normal.    R shoulder - painful obese  Lab Results  Component Value Date   WBC 10.1 11/06/2016   HGB 13.3 11/06/2016   HCT 40.5 11/06/2016   PLT 126 (L) 11/06/2016   GLUCOSE 125 (H) 08/17/2017   CHOL 117 08/17/2017   TRIG 99.0 08/17/2017   HDL 34.10 (L) 08/17/2017   LDLDIRECT 198.1 02/08/2013   LDLCALC 63 08/17/2017   ALT 23 11/02/2016   AST 22 11/02/2016   NA 136 08/17/2017   K 4.6 08/17/2017   CL 100 08/17/2017   CREATININE 1.02 08/17/2017   BUN 16 08/17/2017   CO2 26 08/17/2017   TSH 1.70 09/24/2015   PSA 0.80 02/08/2013   INR 1.07 11/02/2016   HGBA1C 7.4 (H) 08/17/2017    No results found.  Assessment & Plan:   There are no diagnoses linked to this encounter.   No orders of the defined types were placed in this encounter.    Follow-up: No follow-ups on file.  [name], MD

## 2017-12-16 NOTE — Assessment & Plan Note (Signed)
No relapse 

## 2017-12-16 NOTE — Assessment & Plan Note (Signed)
Coreg , Micardis

## 2017-12-16 NOTE — Assessment & Plan Note (Signed)
BP Readings from Last 3 Encounters:  12/16/17 112/66  11/29/17 113/69  08/17/17 124/74

## 2017-12-16 NOTE — Assessment & Plan Note (Signed)
On Crestor 

## 2017-12-16 NOTE — Assessment & Plan Note (Signed)
  Coreg , Micardis, Aldactone, ASA.

## 2017-12-16 NOTE — Assessment & Plan Note (Addendum)
Worse R Inj and Dr Tamala Julian ref offered

## 2017-12-29 ENCOUNTER — Ambulatory Visit (INDEPENDENT_AMBULATORY_CARE_PROVIDER_SITE_OTHER): Payer: 59 | Admitting: Family Medicine

## 2017-12-29 ENCOUNTER — Ambulatory Visit: Payer: Self-pay

## 2017-12-29 ENCOUNTER — Encounter: Payer: Self-pay | Admitting: Family Medicine

## 2017-12-29 VITALS — BP 118/62 | HR 69 | Ht 72.0 in | Wt 234.0 lb

## 2017-12-29 DIAGNOSIS — M25511 Pain in right shoulder: Secondary | ICD-10-CM

## 2017-12-29 DIAGNOSIS — M75111 Incomplete rotator cuff tear or rupture of right shoulder, not specified as traumatic: Secondary | ICD-10-CM

## 2017-12-29 DIAGNOSIS — G8929 Other chronic pain: Secondary | ICD-10-CM

## 2017-12-29 DIAGNOSIS — M75101 Unspecified rotator cuff tear or rupture of right shoulder, not specified as traumatic: Secondary | ICD-10-CM | POA: Insufficient documentation

## 2017-12-29 NOTE — Patient Instructions (Signed)
Good to see you  Ice 20 minutes 2 times daily. Usually after activity and before bed. Keep hands within peripheral vision  Injected the shoulder as well to stimulate healing  pennsaid pinkie amount topically 2 times daily as needed.  Exercises 3 times a week.   See me again in 4 weeks to make sure you are doing well

## 2017-12-29 NOTE — Progress Notes (Signed)
[name] [place], [place] [address] Phone: [phone] Subjective:    I'm seeing this patient by the request  of:  Plotnikov, Evie Lacks, MD   CC: Right shoulder pain  OFB:[id number]  [name] is a 62 y.o. male coming in with complaint of right shoulder pain.  Going on months.  Does not remember any injury.  States that there is some decreased range of motion and severe tenderness even at night.  Waking him up at night.  Sometimes make it difficult with lifting.  States that he is to try harder with the right arm compared to the contralateral side.  Rates the severity pain is 6 out of 10.      Past Medical History:  Diagnosis Date  . Aortic valve sclerosis    aortic valve sclerosis  . Arthritis   . CAD (coronary artery disease)    Myoview 5/18: 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 no ischemia EF 27%  . Carotid artery disease ([place])    Doppler, July, 2011, stable, chronic total occlusion LCCA, retrograde flow from LECA to LICA, ,, 8-24%MPNT,,, 61-44% RICA  . Diabetes mellitus without complication ([place])   . Dizziness   . Ejection fraction < 50%    EF 40%, echo 2006, no ICD needed  / EF 40%, echo, 11/2009  . Former cigarette smoker   . 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 Hyperlipidemia    Low HDL  . Hypertension   . Mitral regurgitation    mild, echo, 11/2009  . Myocardial infarction ([place])   . Peripheral vascular disease ([place])    Dr. Erskin Burnet  . Sleep disorder    Dr Gwenette Greet, lose weight and consider sleep study later  . TIA (transient ischemic attack)    by history  . Volume overload    Past Surgical History:  Procedure Laterality Date  . CHOLECYSTECTOMY    . COLONOSCOPY  03/19/2009  . CORONARY ARTERY BYPASS GRAFT    . ENDARTERECTOMY Right 11/05/2016   Procedure: ENDARTERECTOMY RIGHT CAROTID;  Surgeon: [name], MD;  Location: [facility];  Service: Vascular;  Laterality: Right;  . PATCH ANGIOPLASTY Right  11/05/2016   Procedure: RIGHT CAROTID ARTERY PATCH ANGIOPLASTY USING HEMASHIELD PLATINUM FINESSE PATCH;  Surgeon: [name], MD;  Location[facility];  Service: Vascular;  Laterality: Right;   Social History   Socioeconomic History  . Marital status: Married    Spouse name: Not on file  . Number of children: Not on file  . Years of education: Not on file  . Highest education level: Not on file  Occupational History  . Not on file  Social Needs  . Financial resource strain: Not on file  . Food insecurity:    Worry: Not on file    Inability: Not on file  . Transportation needs:    Medical: Not on file    Non-medical: Not on file  Tobacco Use  . Smoking status: Former Smoker    Last attempt to quit: 05/05/2002    Years since quitting: 15.6  . Smokeless tobacco: Never Used  Substance and Sexual Activity  . Alcohol use: No    Alcohol/week: 0.0 oz  . Drug use: No  . Sexual activity: Yes  Lifestyle  . Physical activity:    Days per week: Not on file    Minutes per session: Not on file  . Stress: Not on file  Relationships  . Social connections:    Talks on phone: Not on file    Gets together: Not on file    Attends religious service: Not on file    Active member of club or organization: Not on file    Attends meetings of clubs or organizations: Not on file    Relationship status: Not on file  Other Topics Concern  . Not on file  Social History Narrative   No Regular exercise   Allergies  Allergen Reactions  . No Known Allergies    Family History  Problem Relation Age of Onset  . Stroke Mother   . Diabetes Father   . COPD Father   . Arthritis Father   . Heart disease Father   . Coronary artery disease Unknown        Male 1st degree relative <50  . Diabetes Unknown        1st degree relative  . Stroke Unknown        Male 1st degree relative<50     Past medical history, social, surgical and family history all reviewed in electronic medical record.  No pertanent  information unless stated regarding to the chief complaint.   Review of Systems:Review of systems updated and as accurate as of 12/29/17  No headache, visual changes, nausea, vomiting, diarrhea, constipation, dizziness, abdominal pain, skin rash, fevers, chills, night sweats, weight loss, swollen lymph nodes, body aches, joint swelling, muscle aches, chest pain, shortness of breath, mood changes.   Objective  Blood pressure 118/62, pulse 69, height 6' (1.829 m), weight 234 lb (106.1 kg), SpO2 96 %. Systems examined below as of 12/29/17   General: No apparent distress alert and oriented x3 mood and affect normal, dressed appropriately.  HEENT: Pupils equal, extraocular movements intact  Respiratory: Patient's speak in full sentences and does not appear short of breath  Cardiovascular: No lower extremity edema, non tender, no erythema  Skin: Warm dry intact with no signs of infection or rash on extremities or on axial skeleton.  Abdomen: Soft nontender  Neuro: Cranial nerves II through XII are intact, neurovascularly intact in all extremities with 2+ DTRs and 2+ pulses.  Lymph: No lymphadenopathy of posterior or anterior cervical chain or axillae bilaterally.  Gait normal with good balance and coordination.  MSK:  Non tender with full range of motion and good stability and symmetric strength and tone of shoulders, elbows, wrist, hip, knee and ankles bilaterally.   Shoulder: Right Inspection reveals no abnormalities, atrophy or asymmetry. Palpation is normal with no tenderness over AC joint or bicipital groove. ROM is full in all planes passively. Rotator cuff strength 4 out of 5 compared to contralateral side signs of impingement with positive Neer and Hawkin's tests, but negative empty can sign. Speeds and Yergason's tests normal. Mild positive O'Brien's Normal scapular function observed. No painful arc and no drop arm sign. No apprehension sign Contralateral shoulder  unremarkable   MSK US performed of: Right This study was ordered, performed, and interpreted by Charlann Boxer D.O.  Shoulder:   Supraspinatus: Articular side tear noted.  Hypoechoic changes noted.  No retraction noted.  Patient does have what appears to be scar tissue formation.. Subscapularis:  Appears normal on long and transverse views. Positive bursa AC joint:  Capsule undistended, no geyser sign. Glenohumeral Joint:  Appears normal without effusion. Glenoid Labrum: Mild calcific changes Biceps Tendon:  Appears normal on long and transverse views, no fraying of tendon, tendon located in intertubercular groove, no subluxation  with shoulder internal or external rotation.  Impression: Articular side tear of the supraspinatus  Procedure: Real-time Ultrasound Guided Injection of right glenohumeral joint Device: GE Logiq E  Ultrasound guided injection is preferred based studies that show increased duration, increased effect, greater accuracy, decreased procedural pain, increased response rate with ultrasound guided versus blind injection.  Verbal informed consent obtained.  Time-out conducted.  Noted no overlying erythema, induration, or other signs of local infection.  Skin prepped in a sterile fashion.  Local anesthesia: Topical Ethyl chloride.  With sterile technique and under real time ultrasound guidance:  Joint visualized.  23g 1  inch needle inserted posterior approach. Pictures taken for needle placement. Patient did have injection of 2 cc of 1% lidocaine, 2 cc of 0.5% Marcaine, and 1.0 cc of Kenalog 40 mg/dL. Completed without difficulty  Pain immediately resolved suggesting accurate placement of the medication.  Advised to call if fevers/chills, erythema, induration, drainage, or persistent bleeding.  Images permanently stored and available for review in the ultrasound unit.  Impression: Technically successful ultrasound guided injection.    Impression and Recommendations:      This case required medical decision making of moderate complexity.      Note: This dictation was prepared with Dragon dictation along with smaller phrase technology. Any transcriptional errors that result from this process are unintentional.

## 2017-12-29 NOTE — Assessment & Plan Note (Signed)
Patient was given an injection.  Tolerated the procedure well.  Patient does have a incomplete tear of the rotator cuff mostly on the articular side with some mild retraction noted.  Patient does have some cars of scar tissue formation of the remaining tendon outpatient response.  Hopefully injection will stimulate some healing.  Home exercise given.  Declined formal physical therapy.  Discussed icing regimen.  Unable to do nitroglycerin secondary to patient's cardiac pathology.  Follow-up again in 4 weeks

## 2017-12-30 ENCOUNTER — Ambulatory Visit (INDEPENDENT_AMBULATORY_CARE_PROVIDER_SITE_OTHER): Payer: 59 | Admitting: Internal Medicine

## 2017-12-30 ENCOUNTER — Encounter: Payer: Self-pay | Admitting: Internal Medicine

## 2017-12-30 DIAGNOSIS — J069 Acute upper respiratory infection, unspecified: Secondary | ICD-10-CM | POA: Diagnosis not present

## 2017-12-30 MED ORDER — PROMETHAZINE-CODEINE 6.25-10 MG/5ML PO SYRP: 5.0000 mL | ORAL_SOLUTION | ORAL | 0 refills | Status: DC | PRN

## 2017-12-30 MED ORDER — AZITHROMYCIN 250 MG PO TABS: ORAL_TABLET | ORAL | 0 refills | Status: DC

## 2017-12-30 NOTE — Progress Notes (Signed)
Subjective:  Patient ID: [name], male    DOB: [birth year]  Age: 62 y.o. MRN: [id number]  CC: No chief complaint on file.   HPI [name] presents for URI sx's x 1 week C/o green d/c  Outpatient Medications Prior to Visit  Medication Sig Dispense Refill  . aspirin 81 MG EC tablet Take 81 mg by mouth daily.      . bromocriptine (PARLODEL) 2.5 MG tablet TAKE ONE-HALF TABLET DAILY 45 tablet 2  . carvedilol (COREG) 25 MG tablet TAKE 1 TABLET (25 MG TOTAL) BY MOUTH 2 (TWO) TIMES DAILY WITH A MEAL. 180 tablet 3  . Cholecalciferol (VITAMIN D3) 1000 UNITS tablet Take 1,000 Units by mouth daily.      . empagliflozin (JARDIANCE) 25 MG TABS tablet Take 25 mg by mouth daily. 30 tablet 11  . glucose blood (ONETOUCH VERIO) test strip Use as instructed 50 each 11  . ibuprofen (ADVIL,MOTRIN) 600 MG tablet Take 1 tablet (600 mg total) by mouth as needed. 100 tablet 3  . linagliptin (TRADJENTA) 5 MG TABS tablet Take 1 tablet (5 mg total) by mouth daily. 30 tablet 11  . metFORMIN (GLUCOPHAGE-XR) 500 MG 24 hr tablet Take 1 tablet (500 mg total) daily with breakfast by mouth. 60 tablet 11  . ONETOUCH DELICA LANCETS 41D MISC USE AS DIRECTED AS NEEDED 100 each 3  . pantoprazole (PROTONIX) 40 MG tablet TAKE 1 TABLET (40 MG TOTAL) BY MOUTH DAILY. 90 tablet 3  . rosuvastatin (CRESTOR) 40 MG tablet Take 1 tablet (40 mg total) by mouth daily. 90 tablet 3  . spironolactone (ALDACTONE) 25 MG tablet TAKE 0.5 TABLETS (12.5 MG TOTAL) BY MOUTH DAILY. 30 tablet 0  . telmisartan (MICARDIS) 40 MG tablet Take 1 tablet (40 mg total) by mouth daily. 90 tablet 3  . traMADol (ULTRAM) 50 MG tablet Take 1-2 tablets (50-100 mg total) by mouth 2 (two) times daily as needed. (Patient taking differently: Take 50 mg by mouth 2 (two) times daily as needed for moderate pain. ) 100 tablet 1   No facility-administered medications prior to visit.     ROS: Review of Systems  Constitutional: Positive for chills and fatigue.  Negative for appetite change and unexpected weight change.  HENT: Positive for congestion. Negative for nosebleeds, sneezing, sore throat and trouble swallowing.   Eyes: Negative for itching and visual disturbance.  Respiratory: Positive for cough.   Cardiovascular: Negative for chest pain, palpitations and leg swelling.  Gastrointestinal: Negative for abdominal distention, blood in stool, diarrhea and nausea.  Genitourinary: Negative for frequency and hematuria.  Musculoskeletal: Negative for back pain, gait problem, joint swelling and neck pain.  Skin: Negative for rash.  Neurological: Negative for dizziness, tremors, speech difficulty and weakness.  Psychiatric/Behavioral: Negative for agitation, dysphoric mood and sleep disturbance. The patient is not nervous/anxious.     Objective:  BP 116/62 (BP Location: Left Arm, Patient Position: Sitting, Cuff Size: Large)   Pulse (!) 59   Temp 97.9 F (36.6 C) (Oral)   Ht 6' (1.829 m)   Wt 232 lb (105.2 kg)   SpO2 98%   BMI 31.46 kg/m   BP Readings from Last 3 Encounters:  12/30/17 116/62  12/29/17 118/62  12/16/17 112/66    Wt Readings from Last 3 Encounters:  12/30/17 232 lb (105.2 kg)  12/29/17 234 lb (106.1 kg)  12/16/17 232 lb (105.2 kg)    Physical Exam  Constitutional: He is oriented to person, place, and time.  He appears well-developed. No distress.  NAD  HENT:  Mouth/Throat: Oropharynx is clear and moist.  Eyes: Pupils are equal, round, and reactive to light. Conjunctivae are normal.  Neck: Normal range of motion. No JVD present. No thyromegaly present.  Cardiovascular: Normal rate, regular rhythm, normal heart sounds and intact distal pulses. Exam reveals no gallop and no friction rub.  No murmur heard. Pulmonary/Chest: Effort normal and breath sounds normal. No respiratory distress. He has no wheezes. He has no rales. He exhibits no tenderness.  Abdominal: Soft. Bowel sounds are normal. He exhibits no distension and  no mass. There is no tenderness. There is no rebound and no guarding.  Musculoskeletal: Normal range of motion. He exhibits no edema or tenderness.  Lymphadenopathy:    He has no cervical adenopathy.  Neurological: He is alert and oriented to person, place, and time. He has normal reflexes. No cranial nerve deficit. He exhibits normal muscle tone. He displays a negative Romberg sign. Coordination and gait normal.  Skin: Skin is warm and dry. No rash noted.  Psychiatric: He has a normal mood and affect. His behavior is normal. Judgment and thought content normal.   eryth nasal/throat   Lab Results  Component Value Date   WBC 10.1 11/06/2016   HGB 13.3 11/06/2016   HCT 40.5 11/06/2016   PLT 126 (L) 11/06/2016   GLUCOSE 134 (H) 12/16/2017   CHOL 123 12/16/2017   TRIG 104.0 12/16/2017   HDL 36.30 (L) 12/16/2017   LDLDIRECT 198.1 02/08/2013   LDLCALC 66 12/16/2017   ALT 23 11/02/2016   AST 22 11/02/2016   NA 136 12/16/2017   K 4.6 12/16/2017   CL 102 12/16/2017   CREATININE 1.07 12/16/2017   BUN 14 12/16/2017   CO2 25 12/16/2017   TSH 1.70 09/24/2015   PSA 0.80 02/08/2013   INR 1.07 11/02/2016   HGBA1C 7.1 (H) 12/16/2017    No results found.  Assessment & Plan:   There are no diagnoses linked to this encounter.   No orders of the defined types were placed in this encounter.    Follow-up: No follow-ups on file.  [name], MD

## 2017-12-30 NOTE — Assessment & Plan Note (Signed)
Zpac Prom-cod syr rest

## 2017-12-30 NOTE — Patient Instructions (Signed)
You can use over-the-counter  "cold" medicines  such as  "Afrin" nasal spray for nasal congestion as directed. Use " Delsym" or" Robitussin" cough syrup varietis for cough.  You can use plain "Tylenol" or "Advil" for fever, chills and achyness. Use Halls or Ricola cough drops.     Please, make an appointment if you are not better or if you're worse.  

## 2018-01-10 ENCOUNTER — Other Ambulatory Visit: Payer: Self-pay | Admitting: Cardiovascular Disease

## 2018-01-15 ENCOUNTER — Other Ambulatory Visit: Payer: Self-pay | Admitting: Internal Medicine

## 2018-01-17 ENCOUNTER — Ambulatory Visit (INDEPENDENT_AMBULATORY_CARE_PROVIDER_SITE_OTHER): Payer: 59 | Admitting: Endocrinology

## 2018-01-17 ENCOUNTER — Encounter: Payer: Self-pay | Admitting: Endocrinology

## 2018-01-17 VITALS — BP 122/64 | HR 61 | Ht 72.0 in | Wt 227.0 lb

## 2018-01-17 DIAGNOSIS — E1165 Type 2 diabetes mellitus with hyperglycemia: Secondary | ICD-10-CM | POA: Diagnosis not present

## 2018-01-17 MED ORDER — BROMOCRIPTINE MESYLATE 2.5 MG PO TABS: 2.5000 mg | ORAL_TABLET | Freq: Every day | ORAL | 3 refills | Status: DC

## 2018-01-17 NOTE — Patient Instructions (Addendum)
check your blood sugar once a day.  vary the time of day when you check, between before the 3 meals, and at bedtime.  also check if you have symptoms of your blood sugar being too high or too low.  please keep a record of the readings and bring it to your next appointment here (or you can bring the meter itself).  You can write it on any piece of paper.  please call us sooner if your blood sugar goes below 70, or if you have a lot of readings over 200.   I have sent a prescription to your pharmacy, to increase the bromocriptine. Please continue the same other diabetes medications.  Please come back for a follow-up appointment in 4-6 months.    

## 2018-01-17 NOTE — Progress Notes (Signed)
Subjective:    Patient ID: [name], male    DOB: October 26, 1955, 62 y.o.   MRN: [id number]  HPI Pt returns for f/u of diabetes mellitus: DM type: 2 Dx'ed: 4098 Complications: CVA and CAD Therapy: 4 oral meds.  DKA: never Severe hypoglycemia: never Pancreatitis: never Other: he has never been on insulin.   Interval history: no cbg record, but states cbg's are well-controlled.  There is no trend throughout the day.  pt states he feels well in general. Past Medical History:  Diagnosis Date  . Aortic valve sclerosis    aortic valve sclerosis  . Arthritis   . CAD (coronary artery disease)    Myoview 5/18: 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 no ischemia EF 27%  . Carotid artery disease ([place])    Doppler, July, 2011, stable, chronic total occlusion LCCA, retrograde flow from LECA to LICA, ,, 1-19%JYNW,,, 29-56% RICA  . Diabetes mellitus without complication ([place])   . Dizziness   . Ejection fraction < 50%    EF 40%, echo 2006, no ICD needed  / EF 40%, echo, 11/2009  . Former cigarette smoker   . 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 Hyperlipidemia    Low HDL  . Hypertension   . Mitral regurgitation    mild, echo, 11/2009  . Myocardial infarction ([place])   . Peripheral vascular disease ([place])    Dr. Erskin Burnet  . Sleep disorder    Dr Gwenette Greet, lose weight and consider sleep study later  . TIA (transient ischemic attack)    by history  . Volume overload     Past Surgical History:  Procedure Laterality Date  . CHOLECYSTECTOMY    . COLONOSCOPY  03/19/2009  . CORONARY ARTERY BYPASS GRAFT    . ENDARTERECTOMY Right 11/05/2016   Procedure: ENDARTERECTOMY RIGHT CAROTID;  Surgeon: [name], MD;  Location: [facility];  Service: Vascular;  Laterality: Right;  . PATCH ANGIOPLASTY Right 11/05/2016   Procedure: RIGHT CAROTID ARTERY PATCH ANGIOPLASTY USING HEMASHIELD PLATINUM FINESSE PATCH;  Surgeon: [name], MD;  Location[facility];  Service: Vascular;  Laterality: Right;     Social History   Socioeconomic History  . Marital status: Married    Spouse name: Not on file  . Number of children: Not on file  . Years of education: Not on file  . Highest education level: Not on file  Occupational History  . Not on file  Social Needs  . Financial resource strain: Not on file  . Food insecurity:    Worry: Not on file    Inability: Not on file  . Transportation needs:    Medical: Not on file    Non-medical: Not on file  Tobacco Use  . Smoking status: Former Smoker    Last attempt to quit: 05/05/2002    Years since quitting: 15.7  . Smokeless tobacco: Never Used  Substance and Sexual Activity  . Alcohol use: No    Alcohol/week: 0.0 oz  . Drug use: No  . Sexual activity: Yes  Lifestyle  . Physical activity:    Days per week: Not on file    Minutes per session: Not on file  . Stress: Not on file  Relationships  . Social connections:    Talks on phone: Not on file    Gets together: Not on file    Attends religious service: Not on file    Active member of club or organization:  Not on file    Attends meetings of clubs or organizations: Not on file    Relationship status: Not on file  . Intimate partner violence:    Fear of current or ex partner: Not on file    Emotionally abused: Not on file    Physically abused: Not on file    Forced sexual activity: Not on file  Other Topics Concern  . Not on file  Social History Narrative   No Regular exercise    Current Outpatient Medications on File Prior to Visit  Medication Sig Dispense Refill  . aspirin 81 MG EC tablet Take 81 mg by mouth daily.      Marland Kitchen azithromycin (ZITHROMAX Z-PAK) 250 MG tablet As directed 6 each 0  . carvedilol (COREG) 25 MG tablet TAKE 1 TABLET (25 MG TOTAL) BY MOUTH 2 (TWO) TIMES DAILY WITH A MEAL. 180 tablet 3  . Cholecalciferol (VITAMIN D3) 1000 UNITS tablet Take 1,000 Units by mouth daily.      . empagliflozin (JARDIANCE) 25 MG TABS tablet Take 25 mg by mouth daily. 30 tablet  11  . glucose blood (ONETOUCH VERIO) test strip Use as instructed 50 each 11  . ibuprofen (ADVIL,MOTRIN) 600 MG tablet Take 1 tablet (600 mg total) by mouth as needed. 100 tablet 3  . linagliptin (TRADJENTA) 5 MG TABS tablet Take 1 tablet (5 mg total) by mouth daily. 30 tablet 11  . metFORMIN (GLUCOPHAGE-XR) 500 MG 24 hr tablet Take 1 tablet (500 mg total) daily with breakfast by mouth. 60 tablet 11  . ONETOUCH DELICA LANCETS 28N MISC USE AS DIRECTED AS NEEDED 100 each 3  . pantoprazole (PROTONIX) 40 MG tablet TAKE 1 TABLET (40 MG TOTAL) BY MOUTH DAILY. 90 tablet 3  . promethazine-codeine (PHENERGAN WITH CODEINE) 6.25-10 MG/5ML syrup Take 5 mLs by mouth every 4 (four) hours as needed. 300 mL 0  . rosuvastatin (CRESTOR) 40 MG tablet Take 1 tablet (40 mg total) by mouth daily. 90 tablet 3  . spironolactone (ALDACTONE) 25 MG tablet Take 0.5 tablets (12.5 mg total) by mouth daily. Patient overdue for appointment, needs to call to schedule for further refills 2nd attempt 7 tablet 0  . telmisartan (MICARDIS) 40 MG tablet TAKE 1 TABLET BY MOUTH EVERY DAY 90 tablet 3  . traMADol (ULTRAM) 50 MG tablet Take 1-2 tablets (50-100 mg total) by mouth 2 (two) times daily as needed. (Patient taking differently: Take 50 mg by mouth 2 (two) times daily as needed for moderate pain. ) 100 tablet 1   No current facility-administered medications on file prior to visit.     Allergies  Allergen Reactions  . No Known Allergies     Family History  Problem Relation Age of Onset  . Stroke Mother   . Diabetes Father   . COPD Father   . Arthritis Father   . Heart disease Father   . Coronary artery disease Unknown        Male 1st degree relative <50  . Diabetes Unknown        1st degree relative  . Stroke Unknown        Male 1st degree relative<50    BP 122/64   Pulse 61   Ht 6' (1.829 m)   Wt 227 lb (103 kg)   SpO2 94%   BMI 30.79 kg/m    Review of Systems He denies hypoglycemia    Objective:    Physical Exam VITAL SIGNS:  See vs page.  GENERAL: no distress.  Pulses: foot pulses are intact bilaterally.    MSK: no deformity of the feet or ankles.  CV: trace bilat edema of the legs.   Skin:  no ulcer on the feet or ankles.  normal color and temp on the feet and ankles.   Neuro: sensation is intact to touch on the feet and ankles.    Lab Results  Component Value Date   HGBA1C 7.1 (H) 12/16/2017   Lab Results  Component Value Date   CREATININE 1.07 12/16/2017   BUN 14 12/16/2017   NA 136 12/16/2017   K 4.6 12/16/2017   CL 102 12/16/2017   CO2 25 12/16/2017   Lab Results  Component Value Date   CALCIUM 9.5 12/16/2017       Assessment & Plan:  Type 2 DM, with CVA: he needs increased rx Edema: this limits rx options.    Patient Instructions  check your blood sugar once a day.  vary the time of day when you check, between before the 3 meals, and at bedtime.  also check if you have symptoms of your blood sugar being too high or too low.  please keep a record of the readings and bring it to your next appointment here (or you can bring the meter itself).  You can write it on any piece of paper.  please call us sooner if your blood sugar goes below 70, or if you have a lot of readings over 200.   I have sent a prescription to your pharmacy, to increase the bromocriptine. Please continue the same other diabetes medications   Please come back for a follow-up appointment in 4-6 months.

## 2018-01-30 NOTE — Progress Notes (Signed)
[name] [address] [place], [name] [address] Phone: [phone] Subjective:     CC: Right shoulder pain, left shoulder pain  HGD:[id number]  [name] is a 62 y.o. male coming in with complaint of right shoulder pain. Right shoulder is better than it was. Now has issues with left shoulder. Heard a pop in the left shoulder.    Onset- last week  Location- anterior Duration-since then has been almost daily ( Character-aching sensation Aggravating factors-certain movements Reliving factors-start moving Therapies tried-nothing Severity-4 out of 10     Past Medical History:  Diagnosis Date  . Aortic valve sclerosis    aortic valve sclerosis  . Arthritis   . CAD (coronary artery disease)    Myoview 5/18: 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 no ischemia EF 27%  . Carotid artery disease ([place])    Doppler, July, 2011, stable, chronic total occlusion LCCA, retrograde flow from LECA to LICA, ,, 6-22%WLNL,,, 89-21% RICA  . Diabetes mellitus without complication ([place])   . Dizziness   . Ejection fraction < 50%    EF 40%, echo 2006, no ICD needed  / EF 40%, echo, 11/2009  . Former cigarette smoker   . 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 Hyperlipidemia    Low HDL  . Hypertension   . Mitral regurgitation    mild, echo, 11/2009  . Myocardial infarction ([place])   . Peripheral vascular disease ([place])    Dr. Erskin Burnet  . Sleep disorder    Dr Gwenette Greet, lose weight and consider sleep study later  . TIA (transient ischemic attack)    by history  . Volume overload    Past Surgical History:  Procedure Laterality Date  . CHOLECYSTECTOMY    . COLONOSCOPY  03/19/2009  . CORONARY ARTERY BYPASS GRAFT    . ENDARTERECTOMY Right 11/05/2016   Procedure: ENDARTERECTOMY RIGHT CAROTID;  Surgeon: [name], MD;  Location: [facility];  Service: Vascular;  Laterality: Right;  . PATCH ANGIOPLASTY Right 11/05/2016   Procedure: RIGHT CAROTID ARTERY PATCH  ANGIOPLASTY USING HEMASHIELD PLATINUM FINESSE PATCH;  Surgeon: [name], MD;  Location[facility];  Service: Vascular;  Laterality: Right;   Social History   Socioeconomic History  . Marital status: Married    Spouse name: Not on file  . Number of children: Not on file  . Years of education: Not on file  . Highest education level: Not on file  Occupational History  . Not on file  Social Needs  . Financial resource strain: Not on file  . Food insecurity:    Worry: Not on file    Inability: Not on file  . Transportation needs:    Medical: Not on file    Non-medical: Not on file  Tobacco Use  . Smoking status: Former Smoker    Last attempt to quit: 05/05/2002    Years since quitting: 15.7  . Smokeless tobacco: Never Used  Substance and Sexual Activity  . Alcohol use: No    Alcohol/week: 0.0 oz  . Drug use: No  . Sexual activity: Yes  Lifestyle  . Physical activity:    Days per week: Not on file    Minutes per session: Not on file  . Stress: Not on file  Relationships  . Social connections:    Talks on phone: Not on file    Gets together: Not on file    Attends religious service:  Not on file    Active member of club or organization: Not on file    Attends meetings of clubs or organizations: Not on file    Relationship status: Not on file  Other Topics Concern  . Not on file  Social History Narrative   No Regular exercise   Allergies  Allergen Reactions  . No Known Allergies    Family History  Problem Relation Age of Onset  . Stroke Mother   . Diabetes Father   . COPD Father   . Arthritis Father   . Heart disease Father   . Coronary artery disease Unknown        Male 1st degree relative <50  . Diabetes Unknown        1st degree relative  . Stroke Unknown        Male 1st degree relative<50     Past medical history, social, surgical and family history all reviewed in electronic medical record.  No pertanent information unless stated regarding to the chief  complaint.   Review of Systems:Review of systems updated and as accurate as of 01/30/18  No headache, visual changes, nausea, vomiting, diarrhea, constipation, dizziness, abdominal pain, skin rash, fevers, chills, night sweats, weight loss, swollen lymph nodes, body aches, joint swelling, muscle aches, chest pain, shortness of breath, mood changes.   Objective  There were no vitals taken for this visit. Systems examined below as of 01/30/18   General: No apparent distress alert and oriented x3 mood and affect normal, dressed appropriately.  HEENT: Pupils equal, extraocular movements intact  Respiratory: Patient's speak in full sentences and does not appear short of breath  Cardiovascular: No lower extremity edema, non tender, no erythema  Skin: Warm dry intact with no signs of infection or rash on extremities or on axial skeleton.  Abdomen: Soft nontender  Neuro: Cranial nerves II through XII are intact, neurovascularly intact in all extremities with 2+ DTRs and 2+ pulses.  Lymph: No lymphadenopathy of posterior or anterior cervical chain or axillae bilaterally.  Gait normal with good balance and coordination.  MSK:  Non tender with full range of motion and good stability and symmetric strength and tone of  elbows, wrist, hip, knee and ankles bilaterally.  Shoulder: left Inspection reveals no abnormalities, atrophy or asymmetry. Palpation is normal with no tenderness over AC joint or bicipital groove. ROM is full in all planes passively. Rotator cuff strength normal throughout. signs of impingement with positive Neer and Hawkin's tests, but negative empty can sign. Speeds and Yergason's tests normal. No labral pathology noted with negative Obrien's, negative clunk and good stability. Normal scapular function observed. No painful arc and no drop arm sign. No apprehension sign Contralateral shoulder has 4 out of 5 strength compared to the contralateral side with mild positive impingement  but this is a significant improvement from previous exam   Procedure: Real-time Ultrasound Guided Injection of left glenohumeral joint Device: GE Logiq E  Ultrasound guided injection is preferred based studies that show increased duration, increased effect, greater accuracy, decreased procedural pain, increased response rate with ultrasound guided versus blind injection.  Verbal informed consent obtained.  Time-out conducted.  Noted no overlying erythema, induration, or other signs of local infection.  Skin prepped in a sterile fashion.  Local anesthesia: Topical Ethyl chloride.  With sterile technique and under real time ultrasound guidance:  Joint visualized.  23g 1  inch needle inserted posterior approach. Pictures taken for needle placement. Patient did have injection of 2 cc  of 1% lidocaine, 2 cc of 0.5% Marcaine, and 1.0 cc of Kenalog 40 mg/dL. Completed without difficulty  Pain immediately resolved suggesting accurate placement of the medication.  Advised to call if fevers/chills, erythema, induration, drainage, or persistent bleeding.  Images permanently stored and available for review in the ultrasound unit.  Impression: Technically successful ultrasound guided injection.   Impression and Recommendations:     This case required medical decision making of moderate complexity.      Note: This dictation was prepared with Dragon dictation along with smaller phrase technology. Any transcriptional errors that result from this process are unintentional.

## 2018-02-01 ENCOUNTER — Encounter: Payer: Self-pay | Admitting: Family Medicine

## 2018-02-01 ENCOUNTER — Ambulatory Visit: Payer: Self-pay

## 2018-02-01 ENCOUNTER — Ambulatory Visit (INDEPENDENT_AMBULATORY_CARE_PROVIDER_SITE_OTHER): Payer: 59 | Admitting: Family Medicine

## 2018-02-01 VITALS — BP 110/64 | HR 59 | Ht 72.0 in | Wt 230.0 lb

## 2018-02-01 DIAGNOSIS — M25511 Pain in right shoulder: Secondary | ICD-10-CM

## 2018-02-01 DIAGNOSIS — G8929 Other chronic pain: Secondary | ICD-10-CM

## 2018-02-01 DIAGNOSIS — M7552 Bursitis of left shoulder: Secondary | ICD-10-CM

## 2018-02-01 DIAGNOSIS — M75111 Incomplete rotator cuff tear or rupture of right shoulder, not specified as traumatic: Secondary | ICD-10-CM

## 2018-02-01 DIAGNOSIS — M25512 Pain in left shoulder: Secondary | ICD-10-CM

## 2018-02-01 NOTE — Assessment & Plan Note (Signed)
Patient given injection.  Discussed icing regimen and home exercises.  Discussed which activities of doing which wants to avoid.  Increase activity as tolerated

## 2018-02-01 NOTE — Patient Instructions (Addendum)
Good to see you  Ice is your friend Pt will be calling you  Keep being active Limit lifting at work  See me again in 4 weeks

## 2018-02-01 NOTE — Assessment & Plan Note (Signed)
Discussed which activities of doing which wants to avoid.  Increase activity as tolerated.  Follow-up with me again for 6 weeks.  Was sent to physical therapy to improve strength and conditioning.

## 2018-02-02 ENCOUNTER — Other Ambulatory Visit: Payer: Self-pay

## 2018-02-02 ENCOUNTER — Ambulatory Visit: Payer: 59 | Attending: Family Medicine | Admitting: Physical Therapy

## 2018-02-02 ENCOUNTER — Encounter: Payer: Self-pay | Admitting: Physical Therapy

## 2018-02-02 DIAGNOSIS — G8929 Other chronic pain: Secondary | ICD-10-CM | POA: Insufficient documentation

## 2018-02-02 DIAGNOSIS — M25511 Pain in right shoulder: Secondary | ICD-10-CM | POA: Insufficient documentation

## 2018-02-02 DIAGNOSIS — M25512 Pain in left shoulder: Secondary | ICD-10-CM | POA: Diagnosis present

## 2018-02-03 NOTE — Therapy (Signed)
[place], [place], [address] Phone: [phone]   Fax:  [phone]  Physical Therapy Evaluation  Patient Details  Name: [name] MRN: [id number] Date of Birth: 09/25/1955 Referring Provider: Dr Hulan Saas    Encounter Date: 02/02/2018  PT End of Session - 02/03/18 1242    Visit Number  1    Number of Visits  12    Date for PT Re-Evaluation  03/17/18    Authorization Type  [employer]     Activity Tolerance  Patient tolerated treatment well    Behavior During Therapy  [facility] for tasks assessed/performed       Past Medical History:  Diagnosis Date  . Aortic valve sclerosis    aortic valve sclerosis  . Arthritis   . CAD (coronary artery disease)    Myoview 5/18: 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 no ischemia EF 27%  . Carotid artery disease ([place])    Doppler, July, 2011, stable, chronic total occlusion LCCA, retrograde flow from LECA to LICA, ,, 2-77%OEUM,,, 35-36% RICA  . Diabetes mellitus without complication ([place])   . Dizziness   . Ejection fraction < 50%    EF 40%, echo 2006, no ICD needed  / EF 40%, echo, 11/2009  . Former cigarette smoker   . 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 Hyperlipidemia    Low HDL  . Hypertension   . Mitral regurgitation    mild, echo, 11/2009  . Myocardial infarction ([place])   . Peripheral vascular disease ([place])    Dr. Erskin Burnet  . Sleep disorder    Dr Gwenette Greet, lose weight and consider sleep study later  . TIA (transient ischemic attack)    by history  . Volume overload     Past Surgical History:  Procedure Laterality Date  . CHOLECYSTECTOMY    . COLONOSCOPY  03/19/2009  . CORONARY ARTERY BYPASS GRAFT    . ENDARTERECTOMY Right 11/05/2016   Procedure: ENDARTERECTOMY RIGHT CAROTID;  Surgeon: [name], MD;  Location: [facility];  Service: Vascular;  Laterality: Right;  . PATCH ANGIOPLASTY Right 11/05/2016   Procedure: RIGHT CAROTID ARTERY  PATCH ANGIOPLASTY USING HEMASHIELD PLATINUM FINESSE PATCH;  Surgeon: [name], MD;  Location: [facility];  Service: Vascular;  Laterality: Right;    There were no vitals filed for this visit.   Subjective Assessment - 02/02/18 1551    Subjective  Patient reports his shoulder have been hurting him for several years. His right has been hurting him the worst. 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  He has trouble reaching overhead as well.     Limitations  Lifting reaching overhead     Patient Stated Goals  Less pain; to be able to keep working.     Currently in Pain?  Yes    Pain Score  6     Pain Orientation  Right    Pain Descriptors / Indicators  Aching    Pain Type  Chronic pain    Pain Onset  More than a month ago    Pain Frequency  Constant    Aggravating Factors   reaching     Pain Relieving Factors  ice, biofreeze     Effect of Pain on Daily Activities  difficulty perfroming certain work tasks     Multiple Pain Sites  Yes    Pain Score  6  Pain Location  Shoulder    Pain Orientation  Left    Pain Descriptors / Indicators  Aching    Pain Type  Chronic pain    Pain Onset  More than a month ago    Pain Frequency  Constant    Aggravating Factors   use of the shoulder     Pain Relieving Factors  ICE, BIO-FREEZE    Effect of Pain on Daily Activities  difficulty perfroming certain work tasks          [facility] PT Assessment - 02/03/18 0001      Assessment   Medical Diagnosis  Bilateral Shoulder Pain     Referring Provider  Dr Hulan Saas     Onset Date/Surgical Date  -- Several years on and off     Hand Dominance  Right    Next MD Visit  03/02/2018     Prior Therapy  None for his shoulders       Precautions   Precautions  None      Restrictions   Weight Bearing Restrictions  No      Balance Screen   Has the patient fallen in the past 6 months  No    Has the patient had a decrease in activity level because of a fear of falling?   No    Is the  patient reluctant to leave their home because of a fear of falling?   No      Home Environment   Additional Comments  nothing pertinant      Prior Function   Level of Independence  Independent    Vocation  Full time employment    Vocation Requirements  Works in [employer] for a company. has to do lifting and a lot of work with his shoulders       Cognition   Overall Cognitive Status  Within Functional Limits for tasks assessed    Attention  Focused    Focused Attention  Appears intact    Memory  Appears intact    Awareness  Appears intact    Problem Solving  Appears intact      Sensation   Light Touch  Appears Intact      Coordination   Gross Motor Movements are Fluid and Coordinated  Yes    Fine Motor Movements are Fluid and Coordinated  Yes      Posture/Postural Control   Posture Comments  Rounded shoulders; humeral head sits forward in bilateral shoulders       ROM / Strength   AROM / PROM / Strength  AROM;PROM;Strength      AROM   Overall AROM Comments  pain with end range ER and flexion L > R     AROM Assessment Site  Shoulder    Right/Left Shoulder  Right;Left      PROM   Overall PROM Comments  pain with end range ER       Strength   Strength Assessment Site  Shoulder    Right/Left Shoulder  Right;Left    Right Shoulder Flexion  4+/5    Right Shoulder ABduction  4+/5    Right Shoulder Internal Rotation  4+/5    Right Shoulder External Rotation  4+/5    Left Shoulder Flexion  4+/5    Left Shoulder ABduction  4+/5    Left Shoulder Internal Rotation  5/5    Left Shoulder External Rotation  5/5      Palpation   Palpation comment  No tendenress to plapation       Special Tests   Other special tests  Hawkins (+) R (-) L ; speeds test (-) bilateral epty can (=) R (-) left                 Objective measurements completed on examination: See above findings.      [facility] Adult PT Treatment/Exercise - 02/03/18 0001      Shoulder Exercises: Standing    External Rotation Limitations  x15 red     Internal Rotation Limitations  x15 red     Extension Limitations  x15 red     Row Limitations  x15 red              PT Education - 02/03/18 1240    Education Details  HEP, symptom mangeent; posture with shoulder pain     Person(s) Educated  Patient    Methods  Explanation;Demonstration;Tactile cues;Verbal cues    Comprehension  Verbalized understanding;Returned demonstration;Verbal cues required;Tactile cues required       PT Short Term Goals - 02/03/18 1254      PT SHORT TERM GOAL #1   Title  Patient will demsotrate full bilateral ER active without pain     Time  3    Period  Weeks    Status  New    Target Date  02/24/18      PT SHORT TERM GOAL #2   Title  Patient will be independent with HEP     Time  3    Period  Weeks    Status  New    Target Date  02/24/18      PT SHORT TERM GOAL #3   Title  Patient improve gross bilateral UE strength to 5/5     Time  3    Period  Weeks    Status  New    Target Date  02/24/18        PT Long Term Goals - 02/03/18 1256      PT LONG TERM GOAL #1   Title  Patient will reach overhead with work takss without increased pain     Time  6    Period  Weeks    Status  New    Target Date  03/17/18      PT LONG TERM GOAL #2   Title  Patient will demostrate a 35% limiation on FOTO     Time  6    Period  Weeks    Status  New             Plan - 02/03/18 1244    Clinical Impression Statement  Patient is a 62 year old male who presents with bilateral shoulder pain that is worse with overhead reaching and work activity. Sings and symptoms are consitent with bilateral shoulder impingement. He has pain with end range flexion and ER R > L. He has some scpaular weakness and minor ER weakness on the right. He would benefit from skilled therapy to improve his posture and sholuder stability when he is perfroming work tasks     History and Personal Factors relevant to plan of care:  CAD;  CABG ,     Clinical Presentation  Evolving    Clinical Presentation due to:  increasing pain with work tasks over time     Clinical Decision Making  Low    Rehab Potential  Good    PT Frequency  2x / week  PT Duration  8 weeks    PT Treatment/Interventions  ADLs/Self Care Home Management;Cryotherapy;Electrical Stimulation;Iontophoresis 4mg /ml Dexamethasone;Moist Heat;Ultrasound;DME Instruction;Neuromuscular re-education;Patient/family education;Passive range of motion;Dry needling;Taping    PT Next Visit Plan  consider mobilizations for end range pain. Patients humeral head sits forward. May benefit from posteriro mobilization and anterior taping. Consdier serratus punch; consder ball v wall; consdier prone row and extension; consider modalities PRN     PT Home Exercise Plan  t-band ext and scap retraction/ t-band IR/ER     Consulted and Agree with Plan of Care  Patient       Patient will benefit from skilled therapeutic intervention in order to improve the following deficits and impairments:  Pain, Decreased activity tolerance, Decreased endurance, Decreased range of motion, Decreased strength, Postural dysfunction  Visit Diagnosis: Chronic right shoulder pain  Chronic left shoulder pain     Problem List Patient Active Problem List   Diagnosis Date Noted  . Acute bursitis of left shoulder 02/01/2018  . Upper respiratory infection 12/30/2017  . Right rotator cuff tear 12/29/2017  . Cramps, extremity 01/20/2017  . Asymptomatic stenosis of right carotid artery 11/05/2016  . Hearing loss d/t noise 08/08/2015  . Shoulder pain, right 08/08/2015  . Chest wall pain 09/13/2014  . Acute upper respiratory infection 05/15/2014  . Obesity 05/15/2014  . Ischemic cardiomyopathy 11/09/2013  . Chronic systolic CHF (congestive heart failure) ([place]) 11/09/2013  . Discoloration of skin 05/04/2013  . Nocturnal leg cramps 05/04/2013  . PVC's (premature ventricular contractions) 03/30/2013  .  Dizziness   . CAD (coronary artery disease)   . Diabetes type 2, uncontrolled ([place]) 02/09/2013  . Cough 06/14/2012  . Neoplasm of uncertain behavior of skin 01/03/2012  . Well adult exam 12/13/2011  . Hypertension   . Peripheral vascular disease ([place])   . TIA (transient ischemic attack)   . Aortic valve sclerosis   . Ejection fraction < 50%   . Hyperlipidemia   . Volume overload   . Sleep disorder   . Mitral regurgitation   . BRONCHITIS 05/15/2010  . KNEE PAIN 05/15/2010  . Actinic keratosis 01/19/2010  . NECK PAIN 01/19/2010  . LEG PAIN 09/30/2009  . SKIN RASH 09/30/2009  . SNORING 01/15/2009  . ABNORMAL LABS 01/15/2009  . OTITIS MEDIA, SEROUS 05/01/2008  . 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    Carney Living PT DPT  02/03/2018, 1:02 PM  [facility] [address] [place], [place], [address] Phone: [phone]   Fax:  [phone]  Name: [name] MRN: [id number] Date of Birth: [birth year]

## 2018-02-04 ENCOUNTER — Other Ambulatory Visit: Payer: Self-pay | Admitting: Internal Medicine

## 2018-02-14 ENCOUNTER — Encounter: Payer: Self-pay | Admitting: Physical Therapy

## 2018-02-14 ENCOUNTER — Ambulatory Visit: Payer: 59 | Admitting: Physical Therapy

## 2018-02-14 DIAGNOSIS — M25512 Pain in left shoulder: Secondary | ICD-10-CM

## 2018-02-14 DIAGNOSIS — M25511 Pain in right shoulder: Principal | ICD-10-CM

## 2018-02-14 DIAGNOSIS — G8929 Other chronic pain: Secondary | ICD-10-CM

## 2018-02-14 NOTE — Therapy (Signed)
[place], [place], [address] Phone: [phone]   Fax:  [phone]  Physical Therapy Treatment  Patient Details  Name: [name] MRN: [id number] Date of Birth: 24-Dec-1955 Referring Provider: Dr Hulan Saas    Encounter Date: 02/14/2018  PT End of Session - 02/14/18 0935    Visit Number  2    Number of Visits  12    Date for PT Re-Evaluation  03/17/18    Authorization Type  [employer]     PT Start Time  0935    PT Stop Time  1016    PT Time Calculation (min)  41 min    Activity Tolerance  Patient tolerated treatment well       Past Medical History:  Diagnosis Date  . Aortic valve sclerosis    aortic valve sclerosis  . Arthritis   . CAD (coronary artery disease)    Myoview 5/18: 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 no ischemia EF 27%  . Carotid artery disease ([place])    Doppler, July, 2011, stable, chronic total occlusion LCCA, retrograde flow from LECA to LICA, ,, 1-70%YFVC,,, 94-49% RICA  . Diabetes mellitus without complication ([place])   . Dizziness   . Ejection fraction < 50%    EF 40%, echo 2006, no ICD needed  / EF 40%, echo, 11/2009  . Former cigarette smoker   . 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 Hyperlipidemia    Low HDL  . Hypertension   . Mitral regurgitation    mild, echo, 11/2009  . Myocardial infarction ([place])   . Peripheral vascular disease ([place])    Dr. Erskin Burnet  . Sleep disorder    Dr Gwenette Greet, lose weight and consider sleep study later  . TIA (transient ischemic attack)    by history  . Volume overload     Past Surgical History:  Procedure Laterality Date  . CHOLECYSTECTOMY    . COLONOSCOPY  03/19/2009  . CORONARY ARTERY BYPASS GRAFT    . ENDARTERECTOMY Right 11/05/2016   Procedure: ENDARTERECTOMY RIGHT CAROTID;  Surgeon: [name], MD;  Location: [facility];  Service: Vascular;  Laterality: Right;  . PATCH ANGIOPLASTY Right 11/05/2016   Procedure:  RIGHT CAROTID ARTERY PATCH ANGIOPLASTY USING HEMASHIELD PLATINUM FINESSE PATCH;  Surgeon: [name], MD;  Location: [facility];  Service: Vascular;  Laterality: Right;    There were no vitals filed for this visit.  Subjective Assessment - 02/14/18 0935    Subjective  Pt reports he was on vacation and went to the beach.  So he hasn't been able to perform HEP as he forgot his sheet.     Patient Stated Goals  Less pain; to be able to keep working.     Currently in Pain?  Yes    Pain Score  2     Pain Location  Shoulder    Pain Orientation  Left;Right   Lt > Rt   Pain Descriptors / Indicators  Aching;Dull    Pain Type  Chronic pain    Pain Onset  More than a month ago    Pain Frequency  Constant    Aggravating Factors   lifitng overhead    Pain Relieving Factors  ice and biofreeze         [facility] PT Assessment - 02/14/18 0001      Strength   Strength Assessment Site  Shoulder    Right  Shoulder Flexion  --   5-/5   Right Shoulder ABduction  4+/5    Right Shoulder Internal Rotation  5/5    Right Shoulder External Rotation  4+/5    Left Shoulder Flexion  5/5    Left Shoulder ABduction  --   5-/5   Left Shoulder Internal Rotation  5/5    Left Shoulder External Rotation  5/5                   [facility] Adult PT Treatment/Exercise - 02/14/18 0001      Exercises   Exercises  Shoulder      Shoulder Exercises: Supine   Other Supine Exercises  15 reps, green band. overhead pull, horizontal abduction, ER     Other Supine Exercises  3x10 sec shoulder presses      Shoulder Exercises: Sidelying   External Rotation  Strengthening;Both;Weights   3x10 with towel under arm   External Rotation Weight (lbs)  3    External Rotation Limitations  VC for form      Shoulder Exercises: ROM/Strengthening   UBE (Upper Arm Bike)  L2x4' Alt FWD/BWD      Shoulder Exercises: Stretch   Other Shoulder Stretches  2x30 sec 3 way doorway stretches      Manual Therapy   Manual Therapy  Joint  mobilization    Joint Mobilization  grade III mobs posterior GH mobs, bilat               PT Short Term Goals - 02/14/18 0950      PT SHORT TERM GOAL #1   Title  Patient will demsotrate full bilateral ER active without pain     Status  On-going      PT SHORT TERM GOAL #2   Title  Patient will be independent with HEP     Status  Achieved      PT SHORT TERM GOAL #3   Title  Patient improve gross bilateral UE strength to 5/5     Status  Partially Met        PT Long Term Goals - 02/14/18 0951      PT LONG TERM GOAL #1   Title  Patient will reach overhead with work takss without increased pain     Status  On-going      PT LONG TERM GOAL #2   Title  Patient will demostrate a 35% limiation on FOTO     Status  On-going            Plan - 02/14/18 1109    Clinical Impression Statement  Naszir has improved shoulder strength, he has been off work since his first visit and returns tomorrow.  He has shown some improvement in pain reduction and strength.  There is a good chance his pain may return with return to work .  Making progress to goals, reported decreased shoulder pressure after mobilzations.     Rehab Potential  Good    PT Frequency  2x / week    PT Duration  8 weeks    PT Treatment/Interventions  ADLs/Self Care Home Management;Cryotherapy;Electrical Stimulation;Iontophoresis 59m/ml Dexamethasone;Moist Heat;Ultrasound;DME Instruction;Neuromuscular re-education;Patient/family education;Passive range of motion;Dry needling;Taping    PT Next Visit Plan  end range shoulder mobs, RTC and postural muscle strenthening    Consulted and Agree with Plan of Care  Patient       Patient will benefit from skilled therapeutic intervention in order to improve the following deficits and impairments:  Pain, Decreased activity tolerance, Decreased endurance, Decreased range of motion, Decreased strength, Postural dysfunction  Visit Diagnosis: Chronic right shoulder pain  Chronic  left shoulder pain     Problem List Patient Active Problem List   Diagnosis Date Noted  . Acute bursitis of left shoulder 02/01/2018  . Upper respiratory infection 12/30/2017  . Right rotator cuff tear 12/29/2017  . Cramps, extremity 01/20/2017  . Asymptomatic stenosis of right carotid artery 11/05/2016  . Hearing loss d/t noise 08/08/2015  . Shoulder pain, right 08/08/2015  . Chest wall pain 09/13/2014  . Acute upper respiratory infection 05/15/2014  . Obesity 05/15/2014  . Ischemic cardiomyopathy 11/09/2013  . Chronic systolic CHF (congestive heart failure) ([place]) 11/09/2013  . Discoloration of skin 05/04/2013  . Nocturnal leg cramps 05/04/2013  . PVC's (premature ventricular contractions) 03/30/2013  . Dizziness   . CAD (coronary artery disease)   . Diabetes type 2, uncontrolled ([place]) 02/09/2013  . Cough 06/14/2012  . Neoplasm of uncertain behavior of skin 01/03/2012  . Well adult exam 12/13/2011  . Hypertension   . Peripheral vascular disease ([place])   . TIA (transient ischemic attack)   . Aortic valve sclerosis   . Ejection fraction < 50%   . Hyperlipidemia   . Volume overload   . Sleep disorder   . Mitral regurgitation   . BRONCHITIS 05/15/2010  . KNEE PAIN 05/15/2010  . Actinic keratosis 01/19/2010  . NECK PAIN 01/19/2010  . LEG PAIN 09/30/2009  . SKIN RASH 09/30/2009  . SNORING 01/15/2009  . ABNORMAL LABS 01/15/2009  . OTITIS MEDIA, SEROUS 05/01/2008  . 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    Jeral Pinch P T 02/14/2018, 11:12 AM  [facility] [address] [place], [place], [address] Phone: [phone]   Fax:  [phone]  Name: AYUUB PENLEY MRN: [id number] Date of Birth: 05-Jul-1956

## 2018-02-16 ENCOUNTER — Ambulatory Visit: Payer: 59 | Admitting: Physical Therapy

## 2018-02-16 ENCOUNTER — Encounter: Payer: Self-pay | Admitting: Physical Therapy

## 2018-02-16 DIAGNOSIS — G8929 Other chronic pain: Secondary | ICD-10-CM

## 2018-02-16 DIAGNOSIS — M25511 Pain in right shoulder: Principal | ICD-10-CM

## 2018-02-16 DIAGNOSIS — M25512 Pain in left shoulder: Secondary | ICD-10-CM

## 2018-02-16 NOTE — Therapy (Signed)
[place], [place], [address] Phone: [phone]   Fax:  [phone]  Physical Therapy Treatment  Patient Details  Name: [name] MRN: [id number] Date of Birth: 1956-06-08 Referring Provider: Dr Hulan Saas    Encounter Date: 02/16/2018  PT End of Session - 02/16/18 1559    Visit Number  3    Number of Visits  12    Date for PT Re-Evaluation  03/17/18    Authorization Type  [employer]     PT Start Time  7034    PT Stop Time  0444    PT Time Calculation (min)  49 min       Past Medical History:  Diagnosis Date  . Aortic valve sclerosis    aortic valve sclerosis  . Arthritis   . CAD (coronary artery disease)    Myoview 5/18: 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 no ischemia EF 27%  . Carotid artery disease ([place])    Doppler, July, 2011, stable, chronic total occlusion LCCA, retrograde flow from LECA to LICA, ,, 0-35%CYEL,,, 85-90% RICA  . Diabetes mellitus without complication ([place])   . Dizziness   . Ejection fraction < 50%    EF 40%, echo 2006, no ICD needed  / EF 40%, echo, 11/2009  . Former cigarette smoker   . 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 Hyperlipidemia    Low HDL  . Hypertension   . Mitral regurgitation    mild, echo, 11/2009  . Myocardial infarction ([place])   . Peripheral vascular disease ([place])    Dr. Erskin Burnet  . Sleep disorder    Dr Gwenette Greet, lose weight and consider sleep study later  . TIA (transient ischemic attack)    by history  . Volume overload     Past Surgical History:  Procedure Laterality Date  . CHOLECYSTECTOMY    . COLONOSCOPY  03/19/2009  . CORONARY ARTERY BYPASS GRAFT    . ENDARTERECTOMY Right 11/05/2016   Procedure: ENDARTERECTOMY RIGHT CAROTID;  Surgeon: [name], MD;  Location: [facility];  Service: Vascular;  Laterality: Right;  . PATCH ANGIOPLASTY Right 11/05/2016   Procedure: RIGHT CAROTID ARTERY PATCH ANGIOPLASTY USING HEMASHIELD  PLATINUM FINESSE PATCH;  Surgeon: [name], MD;  Location: [facility];  Service: Vascular;  Laterality: Right;    There were no vitals filed for this visit.  Subjective Assessment - 02/16/18 1557    Subjective  Returned to work this week. Haven't done much at work to aggravate shoulders. Co-workers help make sure I do not lift 10#     Currently in Pain?  Yes    Pain Score  2     Pain Location  Shoulder    Pain Orientation  Left;Right    Pain Descriptors / Indicators  --   irritating                      [facility] Adult PT Treatment/Exercise - 02/16/18 0001      Shoulder Exercises: Supine   Other Supine Exercises  15 reps, green band. overhead pull, horizontal abduction, ER       Shoulder Exercises: Sidelying   External Rotation  Strengthening;Both;Weights   3x10 with towel under arm   External Rotation Weight (lbs)  3    External Rotation Limitations  VC for form      Shoulder Exercises: Standing   External Rotation Limitations  x20  red     Internal Rotation Limitations  x20 red     Extension Limitations  x20 red     Row Limitations  x20 red       Shoulder Exercises: ROM/Strengthening   UBE (Upper Arm Bike)  L2x4' Alt FWD/BWD      Shoulder Exercises: Stretch   Other Shoulder Stretches  2x30 sec 3 way doorway stretches      Manual Therapy   Manual therapy comments  PROM all planes bilateral                PT Short Term Goals - 02/14/18 0950      PT SHORT TERM GOAL #1   Title  Patient will demsotrate full bilateral ER active without pain     Status  On-going      PT SHORT TERM GOAL #2   Title  Patient will be independent with HEP     Status  Achieved      PT SHORT TERM GOAL #3   Title  Patient improve gross bilateral UE strength to 5/5     Status  Partially Met        PT Long Term Goals - 02/14/18 0951      PT LONG TERM GOAL #1   Title  Patient will reach overhead with work takss without increased pain     Status  On-going      PT  LONG TERM GOAL #2   Title  Patient will demostrate a 35% limiation on FOTO     Status  On-going            Plan - 02/16/18 1640    Clinical Impression Statement  No change since last visit. Will perform HEP until appointment next week. Some mild increases in pain after multiple reps of therex. Also some mild increases in pain with end range stretching, R>L.     PT Next Visit Plan  end range shoulder mobs, RTC and postural muscle strenthening    PT Home Exercise Plan  t-band ext and scap retraction/ t-band IR/ER     Consulted and Agree with Plan of Care  Patient       Patient will benefit from skilled therapeutic intervention in order to improve the following deficits and impairments:  Pain, Decreased activity tolerance, Decreased endurance, Decreased range of motion, Decreased strength, Postural dysfunction  Visit Diagnosis: Chronic right shoulder pain  Chronic left shoulder pain     Problem List Patient Active Problem List   Diagnosis Date Noted  . Acute bursitis of left shoulder 02/01/2018  . Upper respiratory infection 12/30/2017  . Right rotator cuff tear 12/29/2017  . Cramps, extremity 01/20/2017  . Asymptomatic stenosis of right carotid artery 11/05/2016  . Hearing loss d/t noise 08/08/2015  . Shoulder pain, right 08/08/2015  . Chest wall pain 09/13/2014  . Acute upper respiratory infection 05/15/2014  . Obesity 05/15/2014  . Ischemic cardiomyopathy 11/09/2013  . Chronic systolic CHF (congestive heart failure) ([place]) 11/09/2013  . Discoloration of skin 05/04/2013  . Nocturnal leg cramps 05/04/2013  . PVC's (premature ventricular contractions) 03/30/2013  . Dizziness   . CAD (coronary artery disease)   . Diabetes type 2, uncontrolled ([place]) 02/09/2013  . Cough 06/14/2012  . Neoplasm of uncertain behavior of skin 01/03/2012  . Well adult exam 12/13/2011  . Hypertension   . Peripheral vascular disease ([place])   . TIA (transient ischemic attack)   . Aortic valve  sclerosis   . Ejection fraction <  50%   . Hyperlipidemia   . Volume overload   . Sleep disorder   . Mitral regurgitation   . BRONCHITIS 05/15/2010  . KNEE PAIN 05/15/2010  . Actinic keratosis 01/19/2010  . NECK PAIN 01/19/2010  . LEG PAIN 09/30/2009  . SKIN RASH 09/30/2009  . SNORING 01/15/2009  . ABNORMAL LABS 01/15/2009  . OTITIS MEDIA, SEROUS 05/01/2008  . 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    Dorene Ar, PTA 02/16/2018, 4:46 PM  [facility] [address] [place], [place], [address] Phone: [phone]   Fax:  [phone]  Name: KAYLUM SHRUM MRN: [id number] Date of Birth: [birth year]

## 2018-02-23 ENCOUNTER — Encounter: Payer: Self-pay | Admitting: Physical Therapy

## 2018-02-23 ENCOUNTER — Ambulatory Visit: Payer: 59 | Admitting: Physical Therapy

## 2018-02-23 DIAGNOSIS — M25511 Pain in right shoulder: Secondary | ICD-10-CM | POA: Diagnosis not present

## 2018-02-23 DIAGNOSIS — M25512 Pain in left shoulder: Secondary | ICD-10-CM

## 2018-02-23 DIAGNOSIS — G8929 Other chronic pain: Secondary | ICD-10-CM

## 2018-02-23 NOTE — Therapy (Signed)
[place], [place], [address] Phone: [phone]   Fax:  [phone]  Physical Therapy Treatment  Patient Details  Name: [name] MRN: [id number] Date of Birth: 1956-03-15 Referring Provider: Dr Hulan Saas    Encounter Date: 02/23/2018  PT End of Session - 02/23/18 1538    Visit Number  4    Number of Visits  12    Date for PT Re-Evaluation  03/17/18    Authorization Type  [employer]     PT Start Time  0335    PT Stop Time  0415    PT Time Calculation (min)  40 min       Past Medical History:  Diagnosis Date  . Aortic valve sclerosis    aortic valve sclerosis  . Arthritis   . CAD (coronary artery disease)    Myoview 5/18: 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 no ischemia EF 27%  . Carotid artery disease ([place])    Doppler, July, 2011, stable, chronic total occlusion LCCA, retrograde flow from LECA to LICA, ,, 6-29%UTML,,, 46-50% RICA  . Diabetes mellitus without complication ([place])   . Dizziness   . Ejection fraction < 50%    EF 40%, echo 2006, no ICD needed  / EF 40%, echo, 11/2009  . Former cigarette smoker   . 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 Hyperlipidemia    Low HDL  . Hypertension   . Mitral regurgitation    mild, echo, 11/2009  . Myocardial infarction ([place])   . Peripheral vascular disease ([place])    Dr. Erskin Burnet  . Sleep disorder    Dr Gwenette Greet, lose weight and consider sleep study later  . TIA (transient ischemic attack)    by history  . Volume overload     Past Surgical History:  Procedure Laterality Date  . CHOLECYSTECTOMY    . COLONOSCOPY  03/19/2009  . CORONARY ARTERY BYPASS GRAFT    . ENDARTERECTOMY Right 11/05/2016   Procedure: ENDARTERECTOMY RIGHT CAROTID;  Surgeon: [name], MD;  Location: [facility];  Service: Vascular;  Laterality: Right;  . PATCH ANGIOPLASTY Right 11/05/2016   Procedure: RIGHT CAROTID ARTERY PATCH ANGIOPLASTY USING HEMASHIELD  PLATINUM FINESSE PATCH;  Surgeon: [name], MD;  Location: [facility];  Service: Vascular;  Laterality: Right;    There were no vitals filed for this visit.  Subjective Assessment - 02/23/18 1535    Subjective  My left shoulder is getting better. My right shoulder is about the same.     Currently in Pain?  Yes    Pain Score  3     Pain Location  Shoulder    Pain Orientation  Right    Pain Descriptors / Indicators  Aching    Aggravating Factors   reaching up     Pain Relieving Factors  self massage     Pain Score  1    Pain Location  Shoulder    Pain Orientation  Left    Pain Descriptors / Indicators  Aching    Aggravating Factors   dont know     Pain Relieving Factors  not using it                        [facility] Adult PT Treatment/Exercise - 02/23/18 0001      Shoulder Exercises: Supine   Protraction  20 reps   2#  External Rotation  20 reps;AAROM   cane   Flexion  AAROM;10 reps   cane   Other Supine Exercises  15 reps, green band. overhead pull, horizontal abduction, ER       Shoulder Exercises: Prone   Extension  10 reps;Left;Right    Extension Weight (lbs)  2    Horizontal ABduction 1  10 reps    Other [place] Exercises  Bent T x 15 bilateral      Shoulder Exercises: Standing   Horizontal ABduction  20 reps;Theraband    Theraband Level (Shoulder Horizontal ABduction)  Level 3 (Green)    External Rotation Limitations  x 20 green     Internal Rotation Limitations  x 20 green     Extension Limitations  x 20 green     Row Limitations  x20 green      Shoulder Exercises: Pulleys   Flexion  2 minutes      Shoulder Exercises: Stretch   Other Shoulder Stretches  2x30 sec 3 way doorway stretches             PT Education - 02/23/18 1620    Education Details  HEP    Person(s) Educated  Patient    Methods  Explanation;Handout    Comprehension  Verbalized understanding       PT Short Term Goals - 02/14/18 0950      PT SHORT TERM GOAL #1    Title  Patient will demsotrate full bilateral ER active without pain     Status  On-going      PT SHORT TERM GOAL #2   Title  Patient will be independent with HEP     Status  Achieved      PT SHORT TERM GOAL #3   Title  Patient improve gross bilateral UE strength to 5/5     Status  Partially Met        PT Long Term Goals - 02/14/18 0951      PT LONG TERM GOAL #1   Title  Patient will reach overhead with work takss without increased pain     Status  On-going      PT LONG TERM GOAL #2   Title  Patient will demostrate a 35% limiation on FOTO     Status  On-going            Plan - 02/23/18 1540    Clinical Impression Statement  Pt arrives pain is decresing in left shoulder however right shoulder is about the same. Progressed to prone scapular strengthening with good tolerance. Mild increased pain post session.     PT Next Visit Plan  end range shoulder mobs, RTC and postural muscle strenthening; consider ionto on right shoulder if pt has not just gotten off work    PT [place]  t-band ext and scap retraction/ t-band IR/ER , prone horizontal abduction     Consulted and Agree with Plan of Care  Patient       Patient will benefit from skilled therapeutic intervention in order to improve the following deficits and impairments:  Pain, Decreased activity tolerance, Decreased endurance, Decreased range of motion, Decreased strength, Postural dysfunction  Visit Diagnosis: Chronic right shoulder pain  Chronic left shoulder pain     Problem List Patient Active Problem List   Diagnosis Date Noted  . Acute bursitis of left shoulder 02/01/2018  . Upper respiratory infection 12/30/2017  . Right rotator cuff tear 12/29/2017  . Cramps, extremity 01/20/2017  .  Asymptomatic stenosis of right carotid artery 11/05/2016  . Hearing loss d/t noise 08/08/2015  . Shoulder pain, right 08/08/2015  . Chest wall pain 09/13/2014  . Acute upper respiratory infection 05/15/2014  .  Obesity 05/15/2014  . Ischemic cardiomyopathy 11/09/2013  . Chronic systolic CHF (congestive heart failure) ([place]) 11/09/2013  . Discoloration of skin 05/04/2013  . Nocturnal leg cramps 05/04/2013  . PVC's (premature ventricular contractions) 03/30/2013  . Dizziness   . CAD (coronary artery disease)   . Diabetes type 2, uncontrolled ([place]) 02/09/2013  . Cough 06/14/2012  . Neoplasm of uncertain behavior of skin 01/03/2012  . Well adult exam 12/13/2011  . Hypertension   . Peripheral vascular disease ([place])   . TIA (transient ischemic attack)   . Aortic valve sclerosis   . Ejection fraction < 50%   . Hyperlipidemia   . Volume overload   . Sleep disorder   . Mitral regurgitation   . BRONCHITIS 05/15/2010  . KNEE PAIN 05/15/2010  . Actinic keratosis 01/19/2010  . NECK PAIN 01/19/2010  . LEG PAIN 09/30/2009  . SKIN RASH 09/30/2009  . SNORING 01/15/2009  . ABNORMAL LABS 01/15/2009  . OTITIS MEDIA, SEROUS 05/01/2008  . 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    Dorene Ar, PTA 02/23/2018, 4:29 PM  [facility] [address] [place], [place], [address] Phone: [phone]   Fax:  [phone]  Name: LOUDEN HOUSEWORTH MRN: [id number] Date of Birth: 01/19/1956

## 2018-02-25 ENCOUNTER — Other Ambulatory Visit: Payer: Self-pay | Admitting: Endocrinology

## 2018-03-02 ENCOUNTER — Encounter: Payer: Self-pay | Admitting: Family Medicine

## 2018-03-02 ENCOUNTER — Ambulatory Visit (INDEPENDENT_AMBULATORY_CARE_PROVIDER_SITE_OTHER): Payer: 59 | Admitting: Family Medicine

## 2018-03-02 ENCOUNTER — Ambulatory Visit: Payer: 59 | Admitting: Physical Therapy

## 2018-03-02 DIAGNOSIS — M25512 Pain in left shoulder: Secondary | ICD-10-CM

## 2018-03-02 DIAGNOSIS — M75111 Incomplete rotator cuff tear or rupture of right shoulder, not specified as traumatic: Secondary | ICD-10-CM

## 2018-03-02 DIAGNOSIS — M25511 Pain in right shoulder: Principal | ICD-10-CM

## 2018-03-02 DIAGNOSIS — G8929 Other chronic pain: Secondary | ICD-10-CM

## 2018-03-02 NOTE — Therapy (Addendum)
[place], [place], [address] Phone: [phone]   Fax:  [phone]  Physical Therapy Treatment/ Discharge   Patient Details  Name: [name] MRN: [id number] Date of Birth: May 24, 1956 Referring Provider: Dr Hulan Saas    Encounter Date: 03/02/2018  PT End of Session - 03/02/18 0855    Visit Number  5    Number of Visits  12    Date for PT Re-Evaluation  03/17/18    Authorization Type  [employer]     PT Start Time  0846    PT Stop Time  0935    PT Time Calculation (min)  49 min    Activity Tolerance  Patient tolerated treatment well    Behavior During Therapy  [facility] for tasks assessed/performed       Past Medical History:  Diagnosis Date  . Aortic valve sclerosis    aortic valve sclerosis  . Arthritis   . CAD (coronary artery disease)    Myoview 5/18: 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 no ischemia EF 27%  . Carotid artery disease ([place])    Doppler, July, 2011, stable, chronic total occlusion LCCA, retrograde flow from LECA to LICA, ,, 9-35%LEZV,,, 47-15% RICA  . Diabetes mellitus without complication ([place])   . Dizziness   . Ejection fraction < 50%    EF 40%, echo 2006, no ICD needed  / EF 40%, echo, 11/2009  . Former cigarette smoker   . 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 Hyperlipidemia    Low HDL  . Hypertension   . Mitral regurgitation    mild, echo, 11/2009  . Myocardial infarction ([place])   . Peripheral vascular disease ([place])    Dr. Erskin Burnet  . Sleep disorder    Dr Gwenette Greet, lose weight and consider sleep study later  . TIA (transient ischemic attack)    by history  . Volume overload     Past Surgical History:  Procedure Laterality Date  . CHOLECYSTECTOMY    . COLONOSCOPY  03/19/2009  . CORONARY ARTERY BYPASS GRAFT    . ENDARTERECTOMY Right 11/05/2016   Procedure: ENDARTERECTOMY RIGHT CAROTID;  Surgeon: [name], MD;  Location: [place];  Service:  Vascular;  Laterality: Right;  . PATCH ANGIOPLASTY Right 11/05/2016   Procedure: RIGHT CAROTID ARTERY PATCH ANGIOPLASTY USING HEMASHIELD PLATINUM FINESSE PATCH;  Surgeon: [name], MD;  Location: [place];  Service: Vascular;  Laterality: Right;    There were no vitals filed for this visit.  Subjective Assessment - 03/02/18 0853    Subjective  Pt arriving seeing the MD yesterday but forgot to see if the doctor wanted him to change his 10 lb lifting restriction. Pt reporting pain of 4/10 on R and 2/10 on left shoulder    Limitations  Lifting    Patient Stated Goals  Less pain; to be able to keep working.     Currently in Pain?  Yes    Pain Score  4     Pain Location  Shoulder    Pain Orientation  Right    Pain Descriptors / Indicators  Aching;Discomfort;Sore    Pain Type  Chronic pain    Pain Onset  More than a month ago    Pain Frequency  Constant    Aggravating Factors   reaching upward    Multiple Pain Sites  Yes    Pain Score  2  Pain Location  Shoulder    Pain Orientation  Left    Pain Descriptors / Indicators  Aching    Pain Type  Chronic pain    Pain Onset  More than a month ago    Pain Frequency  Constant                       [facility] Adult PT Treatment/Exercise - 03/02/18 0001      Exercises   Exercises  Shoulder      Shoulder Exercises: Supine   Protraction  20 reps   2#   External Rotation  20 reps;AAROM   cane   Flexion  AAROM;10 reps   cane   Other Supine Exercises  15 reps, green band. overhead pull, horizontal abduction, ER       Shoulder Exercises: Prone   Extension  10 reps;Left;Right    Extension Weight (lbs)  2    Horizontal ABduction 1  10 reps    Other Prone Exercises  Bent T x 15 bilateral      Shoulder Exercises: Standing   Horizontal ABduction  20 reps;Theraband    Theraband Level (Shoulder Horizontal ABduction)  Level 3 (Green)    External Rotation Limitations  x 20 green     Internal Rotation Limitations  x 20 green      Extension Limitations  x 20 green     Row Limitations  x20 green    Other Standing Exercises  Door stretch x 3 holding 20 seocnds, shoulder stabilization with ball while performing small circular motions in both directions on each shoulder      Shoulder Exercises: Pulleys   Flexion  2 minutes      Shoulder Exercises: ROM/Strengthening   UBE (Upper Arm Bike)  Level 2.0 6 minutes ( 3 minutes each way)      Shoulder Exercises: Stretch   Other Shoulder Stretches  2x30 sec 3 way doorway stretches      Manual Therapy   Manual Therapy  Joint mobilization;Passive ROM    Manual therapy comments  PROM all planes    Joint Mobilization  grade III mobs GH mobs to bilateral shoulders               PT Short Term Goals - 03/02/18 0900      PT SHORT TERM GOAL #1   Title  Patient will demsotrate full bilateral ER active without pain     Time  3    Period  Weeks    Status  On-going      PT SHORT TERM GOAL #2   Title  Patient will be independent with HEP     Time  3    Period  Weeks    Status  Achieved      PT SHORT TERM GOAL #3   Title  Patient improve gross bilateral UE strength to 5/5     Time  3    Period  Weeks        PT Long Term Goals - 02/14/18 0951      PT LONG TERM GOAL #1   Title  Patient will reach overhead with work takss without increased pain     Status  On-going      PT LONG TERM GOAL #2   Title  Patient will demostrate a 35% limiation on FOTO     Status  On-going            Plan - 03/02/18  0856    Clinical Impression Statement  Pt arriving to therapy reporting soreness/achiness in bilateral shoulders. Pt tolerating his exercises well with some soreness reported. Pt is compliant with his HEP. Continue with skilled PT to progress towrad LTG's set.     Rehab Potential  Good    PT Frequency  2x / week    PT Treatment/Interventions  ADLs/Self Care Home Management;Cryotherapy;Electrical Stimulation;Iontophoresis 66m/ml Dexamethasone;Moist  Heat;Ultrasound;DME Instruction;Neuromuscular re-education;Patient/family education;Passive range of motion;Dry needling;Taping    PT Next Visit Plan  end range shoulder mobs, RTC and postural muscle strenthening; consider ionto on right shoulder if pt has not just gotten off work    PT Home Exercise Plan  t-band ext and scap retraction/ t-band IR/ER , prone horizontal abduction     Consulted and Agree with Plan of Care  Patient       Patient will benefit from skilled therapeutic intervention in order to improve the following deficits and impairments:  Pain, Decreased activity tolerance, Decreased endurance, Decreased range of motion, Decreased strength, Postural dysfunction  Visit Diagnosis: Chronic right shoulder pain  Chronic left shoulder pain    PHYSICAL THERAPY DISCHARGE SUMMARY  Visits from Start of Care:5  Current functional level related to goals / functional outcomes: Unknown patient did not return for follow up    Remaining deficits: Unknown    Education / Equipment: Unknown  Plan: Patient agrees to discharge.  Patient goals were not met. Patient is being discharged due to not returning since the last visit.  ?????      Problem List Patient Active Problem List   Diagnosis Date Noted  . Acute bursitis of left shoulder 02/01/2018  . Upper respiratory infection 12/30/2017  . Right rotator cuff tear 12/29/2017  . Cramps, extremity 01/20/2017  . Asymptomatic stenosis of right carotid artery 11/05/2016  . Hearing loss d/t noise 08/08/2015  . Shoulder pain, right 08/08/2015  . Chest wall pain 09/13/2014  . Acute upper respiratory infection 05/15/2014  . Obesity 05/15/2014  . Ischemic cardiomyopathy 11/09/2013  . Chronic systolic CHF (congestive heart failure) ([place] 11/09/2013  . Discoloration of skin 05/04/2013  . Nocturnal leg cramps 05/04/2013  . PVC's (premature ventricular contractions) 03/30/2013  . Dizziness   . CAD (coronary artery disease)   . Diabetes  type 2, uncontrolled ([place] 02/09/2013  . Cough 06/14/2012  . Neoplasm of uncertain behavior of skin 01/03/2012  . Well adult exam 12/13/2011  . Hypertension   . Peripheral vascular disease ([place]   . TIA (transient ischemic attack)   . Aortic valve sclerosis   . Ejection fraction < 50%   . Hyperlipidemia   . Volume overload   . Sleep disorder   . Mitral regurgitation   . BRONCHITIS 05/15/2010  . KNEE PAIN 05/15/2010  . Actinic keratosis 01/19/2010  . NECK PAIN 01/19/2010  . LEG PAIN 09/30/2009  . SKIN RASH 09/30/2009  . SNORING 01/15/2009  . ABNORMAL LABS 01/15/2009  . OTITIS MEDIA, SEROUS 05/01/2008  . 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   DCarolyne LittlesPT DPT  03/02/2018   JOretha Caprice, MPT 03/02/2018, 9:31 AM  [facility] [address]Phone: [phone]  Fax:  [phone] Name: Lucas ABDULAZIZMRN: [id number] of Birth: [birth year]

## 2018-03-02 NOTE — Progress Notes (Signed)
[name] [place], [place] [address] Phone: [phone] Subjective:   [name], am serving as a scribe for Dr. Hulan Saas.    CC: Bilateral shoulder pain follow-up  IHK:[id number]  [name] is a 62 y.o. male coming in with complaint of bilateral shoulder pain. He feels like his left shoulder is improving with rehab but that his right shoulder continues to bother him. He said that he was in 10/10 pain with prone horizontal abduction. Pain still in the posterior aspect of right shoulder.  Patient states that the right shoulder is significantly worse.  Rates the severity pain is 8 out of 10.  Patient states that the physical therapy seems to be causing more aggravation.  Concerned with this.  Affecting daily activities.      Past Medical History:  Diagnosis Date  . Aortic valve sclerosis    aortic valve sclerosis  . Arthritis   . CAD (coronary artery disease)    Myoview 5/18: 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 Martinez ischemia EF 27%  . Carotid artery disease ([place])    Doppler, July, 2011, stable, chronic total occlusion LCCA, retrograde flow from LECA to LICA, ,, 5-64%PPIR,,, 51-88% RICA  . Diabetes mellitus without complication ([place])   . Dizziness   . Ejection fraction < 50%    EF 40%, echo 2006, Martinez ICD needed  / EF 40%, echo, 11/2009  . Former cigarette smoker   . 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 Hyperlipidemia    Low HDL  . Hypertension   . Mitral regurgitation    mild, echo, 11/2009  . Myocardial infarction ([place])   . Peripheral vascular disease ([place])    Dr. Erskin Burnet  . Sleep disorder    Dr Gwenette Greet, lose weight and consider sleep study later  . TIA (transient ischemic attack)    by history  . Volume overload    Past Surgical History:  Procedure Laterality Date  . CHOLECYSTECTOMY    . COLONOSCOPY  03/19/2009  . CORONARY ARTERY BYPASS GRAFT    . ENDARTERECTOMY Right 11/05/2016   Procedure:  ENDARTERECTOMY RIGHT CAROTID;  Surgeon: [name], MD;  Location: [place];  Service: Vascular;  Laterality: Right;  . PATCH ANGIOPLASTY Right 11/05/2016   Procedure: RIGHT CAROTID ARTERY PATCH ANGIOPLASTY USING HEMASHIELD PLATINUM FINESSE PATCH;  Surgeon: [name], MD;  [facility];  Service: Vascular;  Laterality: Right;   Social History   Socioeconomic History  . Marital status: Married    Spouse name: Not on file  . Number of children: Not on file  . Years of education: Not on file  . Highest education level: Not on file  Occupational History  . Not on file  Social Needs  . Financial resource strain: Not on file  . Food insecurity:    Worry: Not on file    Inability: Not on file  . Transportation needs:    Medical: Not on file    Non-medical: Not on file  Tobacco Use  . Smoking status: Former Smoker    Last attempt to quit: 05/05/2002    Years since quitting: 15.8  . Smokeless tobacco: Never Used  Substance and Sexual Activity  . Alcohol use: Martinez    Alcohol/week: 0.0 standard drinks  . Drug use: Martinez  . Sexual activity: Yes  Lifestyle  . Physical activity:    Days per week: Not on file  Minutes per session: Not on file  . Stress: Not on file  Relationships  . Social connections:    Talks on phone: Not on file    Gets together: Not on file    Attends religious service: Not on file    Active member of club or organization: Not on file    Attends meetings of clubs or organizations: Not on file    Relationship status: Not on file  Other Topics Concern  . Not on file  Social History Narrative   Martinez Regular exercise   Allergies  Allergen Reactions  . Martinez Known Allergies    Family History  Problem Relation Age of Onset  . Stroke Mother   . Diabetes Father   . COPD Father   . Arthritis Father   . Heart disease Father   . Coronary artery disease Unknown        Male 1st degree relative <50  . Diabetes Unknown        1st degree relative  . Stroke Unknown         Male 1st degree relative<50    Current Outpatient Medications (Endocrine & Metabolic):  .  empagliflozin (JARDIANCE) 25 MG TABS tablet, Take 25 mg by mouth daily. Marland Kitchen  linagliptin (TRADJENTA) 5 MG TABS tablet, Take 1 tablet (5 mg total) by mouth daily. .  metFORMIN (GLUCOPHAGE-XR) 500 MG 24 hr tablet, Take 1 tablet (500 mg total) daily with breakfast by mouth.  Current Outpatient Medications (Cardiovascular):  .  carvedilol (COREG) 25 MG tablet, TAKE 1 TABLET (25 MG TOTAL) BY MOUTH 2 (TWO) TIMES DAILY WITH A MEAL. .  rosuvastatin (CRESTOR) 40 MG tablet, Take 1 tablet (40 mg total) by mouth daily. Marland Kitchen  spironolactone (ALDACTONE) 25 MG tablet, Take 0.5 tablets (12.5 mg total) by mouth daily. Patient overdue for appointment, needs to call to schedule for further refills 2nd attempt .  telmisartan (MICARDIS) 40 MG tablet, TAKE 1 TABLET BY MOUTH EVERY DAY  Current Outpatient Medications (Respiratory):  .  promethazine-codeine (PHENERGAN WITH CODEINE) 6.25-10 MG/5ML syrup, Take 5 mLs by mouth every 4 (four) hours as needed.  Current Outpatient Medications (Analgesics):  .  aspirin 81 MG EC tablet, Take 81 mg by mouth daily.   Marland Kitchen  ibuprofen (ADVIL,MOTRIN) 600 MG tablet, Take 1 tablet (600 mg total) by mouth as needed. .  traMADol (ULTRAM) 50 MG tablet, Take 1-2 tablets (50-100 mg total) by mouth 2 (two) times daily as needed. (Patient taking differently: Take 50 mg by mouth 2 (two) times daily as needed for moderate pain. )   Current Outpatient Medications (Other):  .  azithromycin (ZITHROMAX Z-PAK) 250 MG tablet, As directed .  bromocriptine (PARLODEL) 2.5 MG tablet, Take 1 tablet (2.5 mg total) by mouth daily. TAKE ONE-HALF TABLET DAILY .  bromocriptine (PARLODEL) 2.5 MG tablet, TAKE ONE-HALF TABLET DAILY .  Cholecalciferol (VITAMIN D3) 1000 UNITS tablet, Take 1,000 Units by mouth daily.   Marland Kitchen  glucose blood (ONETOUCH VERIO) test strip, Use as instructed .  ONETOUCH DELICA LANCETS 90W MISC,  USE AS DIRECTED AS NEEDED .  pantoprazole (PROTONIX) 40 MG tablet, TAKE 1 TABLET (40 MG TOTAL) BY MOUTH DAILY.    Past medical history, social, surgical and family history all reviewed in electronic medical record.  Martinez pertanent information unless stated regarding to the chief complaint.   Review of Systems:  Martinez headache, visual changes, nausea, vomiting, diarrhea, constipation, dizziness, abdominal pain, skin rash, fevers, chills, night sweats, weight loss, swollen  lymph nodes, body aches, joint swelling,  chest pain, shortness of breath, mood changes.  Positive muscle aches  Objective  Blood pressure 114/82, pulse (!) 56, height 6' (1.829 m), weight 226 lb (102.5 kg), SpO2 96 %.    General: Martinez apparent distress alert and oriented x3 mood and affect normal, dressed appropriately.  HEENT: Pupils equal, extraocular movements intact  Respiratory: Patient's speak in full sentences and does not appear short of breath  Cardiovascular: Martinez lower extremity edema, non tender, Martinez erythema  Skin: Warm dry intact with Martinez signs of infection or rash on extremities or on axial skeleton.  Abdomen: Soft nontender  Neuro: Cranial nerves II through XII are intact, neurovascularly intact in all extremities with 2+ DTRs and 2+ pulses.  Lymph: Martinez lymphadenopathy of posterior or anterior cervical chain or axillae bilaterally.  Gait normal with good balance and coordination.  MSK:  Non tender with full range of motion and good stability and symmetric strength and tone of , elbows, wrist, hip, knee and ankles bilaterally.  Mild arthritic changes  Shoulder: Right Inspection reveals Martinez abnormalities, atrophy or asymmetry. Palpation is normal with Martinez tenderness over AC joint or bicipital groove.  ROM 4 out of 5 strength of the rotator cuff and range of motion is moderately decreased in all planes Martinez signs of impingement with negative Neer and Hawkin's tests, empty can sign. Speeds and Yergason's tests  positive. Positive O'Brien's Normal scapular function observed. Positive painful arc and drop arm Martinez apprehension sign Contralateral shoulder unremarkable      Impression and Recommendations:     This case required medical decision making of moderate complexity. The above documentation has been reviewed and is accurate and complete Lucas Pulley, DO       Note: This dictation was prepared with Dragon dictation along with smaller phrase technology. Any transcriptional errors that result from this process are unintentional.

## 2018-03-02 NOTE — Patient Instructions (Signed)
Good to see you  Ice 20 minutes 2 times daily. Usually after activity and before bed. pennsaid pinkie amount topically 2 times daily as needed.  Call [phone] if you have question or consider MRI or PRP

## 2018-03-02 NOTE — Assessment & Plan Note (Signed)
Right rotator cuff tear.  Patient has failed all conservative therapy including injections, home exercise, physical therapy.  X-rays fairly unremarkable.  Discussed with him about advanced imaging.  Because patient feels like the injury happened at work he is making a Architectural technologist. case.  Encourage patient to see who they side before we do the MRI and then will discuss from there.  Patient is in agreement with the plan.  Other possibilities would be PRP which patient declined follow-up again in 4 to 6 weeks  Spent  25 minutes with patient face-to-face and had greater than 50% of counseling including as described above in assessment and plan.

## 2018-03-07 ENCOUNTER — Ambulatory Visit: Payer: 59 | Admitting: Physical Therapy

## 2018-03-08 ENCOUNTER — Ambulatory Visit: Payer: 59 | Admitting: Physical Therapy

## 2018-03-13 ENCOUNTER — Ambulatory Visit: Payer: 59 | Admitting: Physical Therapy

## 2018-03-15 ENCOUNTER — Ambulatory Visit: Payer: 59 | Admitting: Physical Therapy

## 2018-03-19 IMAGING — NM NM MISC PROCEDURE
3 series · 18 of 18 positions shown · non-contrast
Comparison: none

[Series 1: wbr_r-proj_st rest_(id)_sa · 6.5mm · 6.51mm/px · 6 of 64 frames shown]
[frame 6/64]
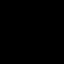
[frame 16/64]
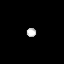
[frame 27/64]
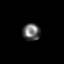
[frame 38/64]
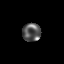
[frame 48/64]
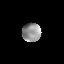
[frame 59/64]
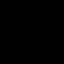

[Series 1: wbr_s-proj_st stress_(id)_sa · 6.5mm · 6.51mm/px · 6 of 64 frames shown (1 of 2)]
[frame 6/64]
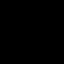
[frame 16/64]
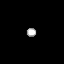
[frame 27/64]
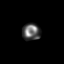
[frame 38/64]
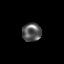
[frame 48/64]
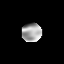
[frame 59/64]
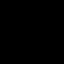

[Series 1: wbr_s-proj_st stress_(id)_sa · 6.5mm · 6.51mm/px · 6 of 512 frames shown (2 of 2)]
[frame 43/512]
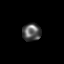
[frame 128/512]
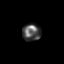
[frame 214/512]
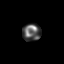
[frame 299/512]
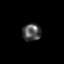
[frame 384/512]
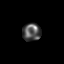
[frame 470/512]
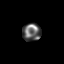

[18 of 18 positions shown; findings below may reference images not displayed]

Canned report from images found in remote index.

Refer to host system for actual result text.

## 2018-03-20 ENCOUNTER — Ambulatory Visit: Payer: 59 | Admitting: Physical Therapy

## 2018-03-22 ENCOUNTER — Encounter: Payer: 59 | Admitting: Physical Therapy

## 2018-03-24 ENCOUNTER — Other Ambulatory Visit: Payer: Self-pay | Admitting: Internal Medicine

## 2018-03-27 ENCOUNTER — Encounter: Payer: 59 | Admitting: Physical Therapy

## 2018-03-29 ENCOUNTER — Encounter: Payer: 59 | Admitting: Physical Therapy

## 2018-03-29 ENCOUNTER — Other Ambulatory Visit: Payer: Self-pay | Admitting: *Deleted

## 2018-03-29 DIAGNOSIS — M25511 Pain in right shoulder: Principal | ICD-10-CM

## 2018-03-29 DIAGNOSIS — G8929 Other chronic pain: Secondary | ICD-10-CM

## 2018-03-30 ENCOUNTER — Other Ambulatory Visit: Payer: Self-pay | Admitting: *Deleted

## 2018-03-30 DIAGNOSIS — G8929 Other chronic pain: Secondary | ICD-10-CM

## 2018-03-30 DIAGNOSIS — M25511 Pain in right shoulder: Principal | ICD-10-CM

## 2018-04-07 ENCOUNTER — Telehealth: Payer: Self-pay | Admitting: Emergency Medicine

## 2018-04-07 NOTE — Telephone Encounter (Signed)
Spoke to pt, we received this same paperwork & faxed in more information to insurance company & they have approved the MRI.

## 2018-04-07 NOTE — Telephone Encounter (Signed)
Patient dropped off [facility] paper stating they requested to postpone MRI due to needing more information. Can you please take a look at it and call patient. Thanks.

## 2018-04-13 ENCOUNTER — Ambulatory Visit
Admission: RE | Admit: 2018-04-13 | Discharge: 2018-04-13 | Disposition: A | Discharge status: Home / Self Care | Payer: 59 | Source: Ambulatory Visit | Attending: Family Medicine | Admitting: Family Medicine

## 2018-04-13 DIAGNOSIS — G8929 Other chronic pain: Secondary | ICD-10-CM

## 2018-04-13 DIAGNOSIS — M25511 Pain in right shoulder: Principal | ICD-10-CM

## 2018-04-13 DIAGNOSIS — M67813 Other specified disorders of tendon, right shoulder: Secondary | ICD-10-CM | POA: Diagnosis not present

## 2018-04-13 MED ORDER — IOPAMIDOL (ISOVUE-M 200) INJECTION 41%
15.0000 mL | Freq: Once | INTRAMUSCULAR | Status: AC
  Administered 2018-04-13: 15 mL via INTRA_ARTICULAR

## 2018-04-17 ENCOUNTER — Encounter: Payer: Self-pay | Admitting: Family Medicine

## 2018-04-25 ENCOUNTER — Other Ambulatory Visit: Payer: Self-pay

## 2018-04-26 MED ORDER — SPIRONOLACTONE 25 MG PO TABS: 12.5000 mg | ORAL_TABLET | Freq: Every day | ORAL | 0 refills | Status: DC

## 2018-05-03 DIAGNOSIS — S43431A Superior glenoid labrum lesion of right shoulder, initial encounter: Secondary | ICD-10-CM | POA: Diagnosis not present

## 2018-05-09 ENCOUNTER — Encounter: Payer: Self-pay | Admitting: Cardiovascular Disease

## 2018-05-10 ENCOUNTER — Ambulatory Visit (INDEPENDENT_AMBULATORY_CARE_PROVIDER_SITE_OTHER): Payer: 59 | Admitting: Cardiovascular Disease

## 2018-05-10 ENCOUNTER — Telehealth: Payer: Self-pay | Admitting: Cardiovascular Disease

## 2018-05-10 ENCOUNTER — Encounter: Payer: Self-pay | Admitting: Nurse Practitioner

## 2018-05-10 ENCOUNTER — Encounter: Payer: Self-pay | Admitting: Cardiovascular Disease

## 2018-05-10 VITALS — BP 100/66 | HR 60 | Ht 72.0 in | Wt 229.0 lb

## 2018-05-10 DIAGNOSIS — I5022 Chronic systolic (congestive) heart failure: Secondary | ICD-10-CM | POA: Diagnosis not present

## 2018-05-10 DIAGNOSIS — E782 Mixed hyperlipidemia: Secondary | ICD-10-CM

## 2018-05-10 DIAGNOSIS — I2510 Atherosclerotic heart disease of native coronary artery without angina pectoris: Secondary | ICD-10-CM

## 2018-05-10 NOTE — Progress Notes (Signed)
Cardiology Office Note   Date:  05/10/2018   ID:  [name], DOB 1956/04/19, MRN [id number]  PCP:  Cassandria Anger, MD  Cardiologist:   Mertie Moores, MD  Transfer from Dr. Ron Parker   No chief complaint on file.  Problem list 1. Coronary artery disease - status post CABG in 2003 2. Chronic systolic congestive heart failure - EF 30-35% 3. Essential hypertension 4. Hyperlipidemia 5. Diabetes Mellitus    [name] is a 62 y.o. male who presents to establish care. He is a previous patient of Dr. Ron Parker.  He has a history of coronary artery disease and coronary artery bypass grafting. He also has a history of hypertension, hyperlipidemia and diabetes.  Works in Education officer, [employer].   He is busy at work but does not get any regular aerobic exercise. Does not have the endurance that he used to .   Nov. 6, 2019:  [name] is seen back today for follow-up visit.  I last saw him in 2016.  He has a history of coronary artery disease and carotid artery disease.  He also has a history of hyperlipidemia, pretension and diabetes mellitus. He status post right carotid endarterectomy in May, 2018.  He has known left common carotid artery occlusion.  injured his right shoulder.   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 Aortic valve sclerosis    aortic valve sclerosis  . Arthritis   . CAD (coronary artery disease)    Myoview 5/18: 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 no ischemia EF 27%  . Carotid artery disease ([place])    Doppler, July, 2011, stable, chronic total occlusion LCCA, retrograde flow from LECA to LICA, ,, 8-41%LKGM,,, 01-02% RICA  . Diabetes mellitus without complication ([place])   . Dizziness   . Ejection fraction < 50%    EF 40%, echo 2006, no ICD needed  / EF 40%, echo, 11/2009  . Former cigarette smoker   .  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 Hyperlipidemia    Low HDL  . Hypertension   . Mitral regurgitation    mild, echo, 11/2009  . Myocardial infarction ([place])   . Peripheral vascular disease ([place])    Dr. Erskin Burnet  . Sleep disorder    Dr Gwenette Greet, lose weight and consider sleep study later  . TIA (transient ischemic attack)    by history  . Volume overload     Past Surgical History:  Procedure Laterality Date  . CHOLECYSTECTOMY    . COLONOSCOPY  03/19/2009  . CORONARY ARTERY BYPASS GRAFT    . ENDARTERECTOMY Right 11/05/2016   Procedure: ENDARTERECTOMY RIGHT CAROTID;  Surgeon: [name], MD;  Location: [facility];  Service: Vascular;  Laterality: Right;  . PATCH ANGIOPLASTY Right 11/05/2016   Procedure: RIGHT CAROTID ARTERY PATCH ANGIOPLASTY USING HEMASHIELD PLATINUM FINESSE PATCH;  Surgeon: [name], MD;  Location: [facility];  Service: Vascular;  Laterality: Right;     Current Outpatient Medications  Medication Sig Dispense Refill  . aspirin 81 MG EC tablet Take 81 mg by mouth daily.      . bromocriptine (PARLODEL) 2.5 MG tablet Take 2.5 mg by mouth daily.    . carvedilol (COREG) 25 MG tablet TAKE 1 TABLET (25 MG TOTAL) BY MOUTH  2 (TWO) TIMES DAILY WITH A MEAL. 180 tablet 3  . Cholecalciferol (VITAMIN D3) 1000 UNITS tablet Take 1,000 Units by mouth daily.      . empagliflozin (JARDIANCE) 25 MG TABS tablet Take 25 mg by mouth daily. 30 tablet 11  . glucose blood (ONETOUCH VERIO) test strip Use as instructed 50 each 11  . ibuprofen (ADVIL,MOTRIN) 600 MG tablet Take 1 tablet (600 mg total) by mouth as needed. 100 tablet 3  . linagliptin (TRADJENTA) 5 MG TABS tablet Take 1 tablet (5 mg total) by mouth daily. 30 tablet 11  . metFORMIN (GLUCOPHAGE-XR) 500 MG 24 hr tablet Take 1 tablet (500 mg total) daily with breakfast by mouth. 60 tablet 11  . ONETOUCH DELICA LANCETS 96V MISC USE AS DIRECTED AS NEEDED 100 each 3  . pantoprazole (PROTONIX) 40 MG tablet TAKE 1 TABLET (40 MG TOTAL) BY MOUTH  DAILY. 90 tablet 3  . rosuvastatin (CRESTOR) 40 MG tablet TAKE 1 TABLET BY MOUTH EVERY DAY 90 tablet 3  . spironolactone (ALDACTONE) 25 MG tablet Take 12.5 mg by mouth daily.    Marland Kitchen telmisartan (MICARDIS) 40 MG tablet TAKE 1 TABLET BY MOUTH EVERY DAY 90 tablet 3  . traMADol (ULTRAM) 50 MG tablet Take 1-2 tablets (50-100 mg total) by mouth 2 (two) times daily as needed. (Patient taking differently: Take 50 mg by mouth 2 (two) times daily as needed for moderate pain. ) 100 tablet 1   No current facility-administered medications for this visit.     Allergies:   No known allergies    Social History:  The patient  reports that he quit smoking about 16 years ago. He has never used smokeless tobacco. He reports that he does not drink alcohol or use drugs.   Family History:  The patient's family history includes Arthritis in his father; COPD in his father; Coronary artery disease in his unknown relative; Diabetes in his father and unknown relative; Heart disease in his father; Stroke in his mother and unknown relative.    ROS:  Please see the history of present illness.     Physical Exam: Blood pressure 100/66, pulse 60, height 6' (1.829 m), weight 229 lb (103.9 kg), SpO2 97 %.  GEN:  Well nourished, well developed in no acute distress HEENT: Normal NECK: No JVD;  R cea scar  LYMPHATICS: No lymphadenopathy CARDIAC: RRR  RESPIRATORY:  Clear to auscultation without rales, wheezing or rhonchi  ABDOMEN: Soft, non-tender, non-distended MUSCULOSKELETAL:  No edema; No deformity  SKIN: Warm and dry NEUROLOGIC:  Alert and oriented x 3   EKG: May 10, 2018: Normal sinus rhythm at 60.  Previous inferior wall myocardial infarction.   Recent Labs: 12/16/2017: BUN 14; Creatinine, Ser 1.07; Potassium 4.6; Sodium 136    Lipid Panel    Component Value Date/Time   CHOL 123 12/16/2017 0815   TRIG 104.0 12/16/2017 0815   TRIG 92 05/09/2006 0732   HDL 36.30 (L) 12/16/2017 0815   CHOLHDL 3  12/16/2017 0815   VLDL 20.8 12/16/2017 0815   LDLCALC 66 12/16/2017 0815   LDLDIRECT 198.1 02/08/2013 1112      Wt Readings from Last 3 Encounters:  05/10/18 229 lb (103.9 kg)  03/02/18 226 lb (102.5 kg)  02/01/18 230 lb (104.3 kg)      Other studies Reviewed: Additional studies/ records that were reviewed today include: . Review of the above records demonstrates:    ASSESSMENT AND PLAN:  1. Coronary artery disease - status post CABG  in 2003 - -he is doing very well.  Is not had any episodes of chest discomfort.  He is at low risk for his upcoming shoulder surgery.  I would prefer that he continue aspirin 81 mg a day through the surgery.  If it is thought that this will greatly increase his risk of bleeding then he can hold the aspirin for 5 to 7 days if absolutely needed. Restart the aspirin the day after surgery if everything is stable.   2. Chronic systolic congestive heart failure :   His ejection fraction is severely depressed.  His EF was 27% when measured by Myoview study in May, 2019.  This is basically unchanged.   3. Essential hypertension-blood pressure is well controlled. . 4. Hyperlipidemia -his lipids have been managed by his primary medical doctor. .  Current medicines are reviewed at length with the patient today.  The patient does not have concerns regarding medicines.  The following changes have been made:  no change  Labs/ tests ordered today include:  No orders of the defined types were placed in this encounter.    Disposition:   FU with me in 1 year    Mertie Moores, MD  05/10/2018 11:50 AM    [address], [place], [place]  [address] Phone: [phone]; Fax: [phone]

## 2018-05-10 NOTE — Telephone Encounter (Signed)
Work note signed and faxed to [phone] Confirmation received

## 2018-05-10 NOTE — Patient Instructions (Signed)
Medication Instructions:  Your physician recommends that you continue on your current medications as directed. Please refer to the Current Medication list given to you today.  If you need a refill on your cardiac medications before your next appointment, please call your pharmacy.   Lab work: None Ordered   Testing/Procedures: None Ordered   Follow-Up: At [facility], you and your health needs are our priority.  As part of our continuing mission to provide you with exceptional heart care, we have created designated Provider Care Teams.  These Care Teams include your primary Cardiologist (physician) and Advanced Practice Providers (APPs -  Physician Assistants and Nurse Practitioners) who all work together to provide you with the care you need, when you need it. You will need a follow up appointment in:  1 year.  Please call our office 2 months in advance to schedule this appointment.  You may see Philip Nahser, MD or one of the following Advanced Practice Providers on your designated Care Team: Scott Weaver, PA-C Vin Bhagat, PA-C . Janine Hammond, NP   

## 2018-05-10 NOTE — Telephone Encounter (Signed)
New Message:    Patient forgot to get a note for work today. Please fax the note to [phone]

## 2018-05-18 ENCOUNTER — Other Ambulatory Visit: Payer: Self-pay | Admitting: Pediatrics

## 2018-05-19 ENCOUNTER — Encounter (HOSPITAL_COMMUNITY): Payer: Self-pay

## 2018-05-19 NOTE — Pre-Procedure Instructions (Signed)
[name]  05/19/2018      [id number] [address], [place] [address] [place] [address] Phone: [phone] Fax: [place], [place] [facility] [address] [place] [address] [place] [address] Phone: [phone] Fax: [phone]    Your procedure is scheduled on Thursday November 21.  Report to [facility] Admitting at 10:25 A.M.  Call this number if you have problems the morning of surgery:  [phone]   Remember:  Do not eat or drink after midnight.    Take these medicines the morning of surgery with A SIP OF WATER:   Carvedilol (Coreg) Pantoprazole (protonix) Tramadol (utlram) if needed  DO NOT TAKE Metformin (Glucophage), Linagliptin (Tradjenta), Empagliflozin (Jardiance) the day of surgery.   7 days prior to surgery STOP taking any Aspirin(unless otherwise instructed by your surgeon), Aleve, Naproxen, Ibuprofen, Motrin, Advil, Goody's, BC's, all herbal medications, fish oil, and all vitamins     How to Manage Your Diabetes Before and After Surgery  Why is it important to control my blood sugar before and after surgery? . Improving blood sugar levels before and after surgery helps healing and can limit problems. . A way of improving blood sugar control is eating a healthy diet by: o  Eating less sugar and carbohydrates o  Increasing activity/exercise o  Talking with your doctor about reaching your blood sugar goals . High blood sugars (greater than 180 mg/dL) can raise your risk of infections and slow your recovery, so you will need to focus on controlling your diabetes during the weeks before surgery. . Make sure that the doctor who takes care of your diabetes knows about your planned surgery including the date and location.  How do I manage my blood sugar before surgery? . Check your blood sugar at least 4 times a day, starting 2 days before surgery,  to make sure that the level is not too high or low. o Check your blood sugar the morning of your surgery when you wake up and every 2 hours until you get to the Short Stay unit. . If your blood sugar is less than 70 mg/dL, you will need to treat for low blood sugar: o Do not take insulin. o Treat a low blood sugar (less than 70 mg/dL) with  cup of clear juice (cranberry or apple), 4 glucose tablets, OR glucose gel. Recheck blood sugar in 15 minutes after treatment (to make sure it is greater than 70 mg/dL). If your blood sugar is not greater than 70 mg/dL on recheck, call [phone] o  for further instructions. . Report your blood sugar to the short stay nurse when you get to Short Stay.  . If you are admitted to the hospital after surgery: o Your blood sugar will be checked by the staff and you will probably be given insulin after surgery (instead of oral diabetes medicines) to make sure you have good blood sugar levels. o The goal for blood sugar control after surgery is 80-180 mg/dL.                Do not wear jewelry  Do not wear lotions, powders, or colognes, or deodorant.  Do not shave 48 hours prior to surgery.  Men may shave face and neck.  Do not bring valuables to the hospital.  [facility] is not responsible for any belongings or valuables.  Contacts, dentures or bridgework  may not be worn into surgery.  Leave your suitcase in the car.  After surgery it may be brought to your room.  For patients admitted to the hospital, discharge time will be determined by your treatment team.  Patients discharged the day of surgery will not be allowed to drive home.   Special instructions:    [facility]- Preparing For Surgery  Before surgery, you can play an important role. Because skin is not sterile, your skin needs to be as free of germs as possible. You can reduce the number of germs on your skin by washing with CHG (chlorahexidine gluconate) Soap before surgery.  CHG is  an antiseptic cleaner which kills germs and bonds with the skin to continue killing germs even after washing.    Oral Hygiene is also important to reduce your risk of infection.  Remember - BRUSH YOUR TEETH THE MORNING OF SURGERY WITH YOUR REGULAR TOOTHPASTE  Please do not use if you have an allergy to CHG or antibacterial soaps. If your skin becomes reddened/irritated stop using the CHG.  Do not shave (including legs and underarms) for at least 48 hours prior to first CHG shower. It is OK to shave your face.  Please follow these instructions carefully.   1. Shower the NIGHT BEFORE SURGERY and the MORNING OF SURGERY with CHG.   2. If you chose to wash your hair, wash your hair first as usual with your normal shampoo.  3. After you shampoo, rinse your hair and body thoroughly to remove the shampoo.  4. Use CHG as you would any other liquid soap. You can apply CHG directly to the skin and wash gently with a scrungie or a clean washcloth.   5. Apply the CHG Soap to your body ONLY FROM THE NECK DOWN.  Do not use on open wounds or open sores. Avoid contact with your eyes, ears, mouth and genitals (private parts). Wash Face and genitals (private parts)  with your normal soap.  6. Wash thoroughly, paying special attention to the area where your surgery will be performed.  7. Thoroughly rinse your body with warm water from the neck down.  8. DO NOT shower/wash with your normal soap after using and rinsing off the CHG Soap.  9. Pat yourself dry with a CLEAN TOWEL.  10. Wear CLEAN PAJAMAS to bed the night before surgery, wear comfortable clothes the morning of surgery  11. Place CLEAN SHEETS on your bed the night of your first shower and DO NOT SLEEP WITH PETS.    Day of Surgery:  Do not apply any deodorants/lotions.  Please wear clean clothes to the hospital/surgery center.   Remember to brush your teeth WITH YOUR REGULAR TOOTHPASTE.    Please read over the following fact sheets that  you were given. Coughing and Deep Breathing and Surgical Site Infection Prevention

## 2018-05-22 ENCOUNTER — Other Ambulatory Visit: Payer: Self-pay

## 2018-05-22 ENCOUNTER — Encounter (HOSPITAL_COMMUNITY)
Admission: RE | Admit: 2018-05-22 | Discharge: 2018-05-22 | Disposition: A | Discharge status: Home / Self Care | Payer: 59 | Source: Ambulatory Visit | Attending: Orthopedic Surgery | Admitting: Orthopedic Surgery

## 2018-05-22 ENCOUNTER — Encounter (HOSPITAL_COMMUNITY): Payer: Self-pay

## 2018-05-22 DIAGNOSIS — E785 Hyperlipidemia, unspecified: Secondary | ICD-10-CM | POA: Diagnosis not present

## 2018-05-22 DIAGNOSIS — Z01818 Encounter for other preprocedural examination: Secondary | ICD-10-CM | POA: Insufficient documentation

## 2018-05-22 DIAGNOSIS — Z7982 Long term (current) use of aspirin: Secondary | ICD-10-CM | POA: Diagnosis not present

## 2018-05-22 DIAGNOSIS — Z791 Long term (current) use of non-steroidal anti-inflammatories (NSAID): Secondary | ICD-10-CM | POA: Diagnosis not present

## 2018-05-22 DIAGNOSIS — M75111 Incomplete rotator cuff tear or rupture of right shoulder, not specified as traumatic: Secondary | ICD-10-CM | POA: Diagnosis not present

## 2018-05-22 DIAGNOSIS — X501XXA Overexertion from prolonged static or awkward postures, initial encounter: Secondary | ICD-10-CM | POA: Diagnosis not present

## 2018-05-22 DIAGNOSIS — Z87891 Personal history of nicotine dependence: Secondary | ICD-10-CM | POA: Diagnosis not present

## 2018-05-22 DIAGNOSIS — S43431A Superior glenoid labrum lesion of right shoulder, initial encounter: Secondary | ICD-10-CM | POA: Diagnosis present

## 2018-05-22 DIAGNOSIS — Z79899 Other long term (current) drug therapy: Secondary | ICD-10-CM | POA: Diagnosis not present

## 2018-05-22 DIAGNOSIS — Z951 Presence of aortocoronary bypass graft: Secondary | ICD-10-CM | POA: Diagnosis not present

## 2018-05-22 DIAGNOSIS — E1151 Type 2 diabetes mellitus with diabetic peripheral angiopathy without gangrene: Secondary | ICD-10-CM | POA: Diagnosis not present

## 2018-05-22 DIAGNOSIS — M25511 Pain in right shoulder: Secondary | ICD-10-CM

## 2018-05-22 DIAGNOSIS — M7551 Bursitis of right shoulder: Secondary | ICD-10-CM | POA: Diagnosis not present

## 2018-05-22 DIAGNOSIS — I252 Old myocardial infarction: Secondary | ICD-10-CM | POA: Diagnosis not present

## 2018-05-22 DIAGNOSIS — Z7984 Long term (current) use of oral hypoglycemic drugs: Secondary | ICD-10-CM | POA: Diagnosis not present

## 2018-05-22 DIAGNOSIS — I2510 Atherosclerotic heart disease of native coronary artery without angina pectoris: Secondary | ICD-10-CM | POA: Diagnosis not present

## 2018-05-22 DIAGNOSIS — G8929 Other chronic pain: Secondary | ICD-10-CM | POA: Insufficient documentation

## 2018-05-22 DIAGNOSIS — M7541 Impingement syndrome of right shoulder: Secondary | ICD-10-CM | POA: Diagnosis not present

## 2018-05-22 DIAGNOSIS — I10 Essential (primary) hypertension: Secondary | ICD-10-CM | POA: Diagnosis not present

## 2018-05-22 DIAGNOSIS — Z8673 Personal history of transient ischemic attack (TIA), and cerebral infarction without residual deficits: Secondary | ICD-10-CM | POA: Diagnosis not present

## 2018-05-22 LAB — BASIC METABOLIC PANEL
Anion gap: 7 (ref 5–15)
BUN: 15 mg/dL (ref 8–23)
CO2: 23 mmol/L (ref 22–32)
Calcium: 9.5 mg/dL (ref 8.9–10.3)
Chloride: 106 mmol/L (ref 98–111)
Creatinine, Ser: 0.98 mg/dL (ref 0.61–1.24)
GFR calc Af Amer: >60 mL/min (ref >60)
Glucose, Bld: 108 mg/dL — ABNORMAL HIGH (ref 70–99)
POTASSIUM: 4.6 mmol/L (ref 3.5–5.1)
Sodium: 136 mmol/L (ref 135–145)

## 2018-05-22 LAB — CBC
HCT: 45.9 % (ref 39.0–52.0)
HEMOGLOBIN: 15.1 g/dL (ref 13.0–17.0)
MCH: 32.1 pg (ref 26.0–34.0)
MCHC: 32.9 g/dL (ref 30.0–36.0)
MCV: 97.5 fL (ref 80.0–100.0)
Platelets: 192 10*3/uL (ref 150–400)
RBC: 4.71 MIL/uL (ref 4.22–5.81)
RDW: 11.7 % (ref 11.5–15.5)
WBC: 7.9 10*3/uL (ref 4.0–10.5)
nRBC: 0 % (ref 0.0–0.2)

## 2018-05-22 LAB — GLUCOSE, CAPILLARY: Glucose-Capillary: 106 mg/dL — ABNORMAL HIGH (ref 70–99)

## 2018-05-22 LAB — HEMOGLOBIN A1C
HEMOGLOBIN A1C: 6.3 % — AB (ref 4.8–5.6)
MEAN PLASMA GLUCOSE: 134.11 mg/dL

## 2018-05-22 NOTE — Progress Notes (Signed)
PCP - [facility] Cardiologist - Dr Acie Fredrickson  Chest x-ray - N/A EKG - 05/10/18 Stress Test - 11/03/16 ECHO - 05/15/15  Fasting Blood Sugar - 90-130s, A1c checked todayAspirin Instructions:  Pt to continue taking ASA Anesthesia review: heart hx  Patient denies shortness of breath, fever, cough and chest pain at PAT appointment   Patient verbalized understanding of instructions that were given to them at the PAT appointment. Patient was also instructed that they will need to review over the PAT instructions again at home before surgery.

## 2018-05-22 NOTE — Anesthesia Preprocedure Evaluation (Addendum)
Anesthesia Evaluation  Patient identified by MRN, date of birth, ID band Patient awake    Reviewed: Allergy & Precautions, H&P , NPO status , Patient's Chart, lab work & pertinent test results  Airway Mallampati: II  TM Distance: >3 FB Neck ROM: Full    Dental no notable dental hx. (+) Teeth Intact, Dental Advisory Given   Pulmonary neg pulmonary ROS, former smoker,    Pulmonary exam normal breath sounds clear to auscultation       Cardiovascular Exercise Tolerance: Good hypertension, Pt. on medications and Pt. on home beta blockers + CAD, + Past MI, + Peripheral Vascular Disease and +CHF  Normal cardiovascular exam Rhythm:Regular Rate:Normal  Myoview 5/18  Nuclear stress EF: 27%.  Blood pressure demonstrated a normal response to exercise.  There was no ST segment deviation noted during stress.  Findings consistent with prior myocardial infarction.  This is a high risk study.  The left ventricular ejection fraction is severely decreased (<30%).   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 no ischemia EF 27%   Neuro/Psych TIAnegative psych ROS   GI/Hepatic Neg liver ROS, GERD  Medicated,  Endo/Other  diabetes, Well Controlled, Type 2  Renal/GU negative Renal ROS  negative genitourinary   Musculoskeletal  (+) Arthritis ,   Abdominal (+) + obese,   Peds  Hematology negative hematology ROS (+)   Anesthesia Other Findings   Reproductive/Obstetrics                           Lab Results  Component Value Date   CREATININE 0.98 05/22/2018   BUN 15 05/22/2018   NA 136 05/22/2018   K 4.6 05/22/2018   CL 106 05/22/2018   CO2 23 05/22/2018    Lab Results  Component Value Date   WBC 7.9 05/22/2018   HGB 15.1 05/22/2018   HCT 45.9 05/22/2018   MCV 97.5 05/22/2018   PLT 192 05/22/2018    Anesthesia Physical Anesthesia Plan  ASA: IV  Anesthesia Plan: General    Post-op Pain Management:  Regional for Post-op pain   Induction: Intravenous  PONV Risk Score and Plan: 2 and Treatment may vary due to age or medical condition, Ondansetron and Dexamethasone  Airway Management Planned: Oral ETT  Additional Equipment:   Intra-op Plan:   Post-operative Plan: Extubation in OR  Informed Consent: I have reviewed the patients History and Physical, chart, labs and discussed the procedure including the risks, benefits and alternatives for the proposed anesthesia with the patient or authorized representative who has indicated his/her understanding and acceptance.   Dental advisory given  Plan Discussed with: CRNA  Anesthesia Plan Comments: (Cardiac clearance by Dr. Acie Fredrickson 05/10/18 "Coronary artery disease - status post CABG in 2003 - he is doing very well.  Has not had any episodes of chest discomfort.  He is at low risk for his upcoming shoulder surgery. Chronic systolic congestive heart failure - His ejection fraction is severely depressed.  His EF was 27% when measured by Myoview study in May, 2019. This is basically unchanged.")      Anesthesia Quick Evaluation

## 2018-05-24 ENCOUNTER — Ambulatory Visit: Payer: 59 | Admitting: Cardiovascular Disease

## 2018-05-25 ENCOUNTER — Ambulatory Visit (HOSPITAL_COMMUNITY): Payer: 59 | Admitting: Physician Assistant

## 2018-05-25 ENCOUNTER — Ambulatory Visit (HOSPITAL_COMMUNITY): Payer: 59 | Admitting: Anesthesiology

## 2018-05-25 ENCOUNTER — Encounter (HOSPITAL_COMMUNITY)
Admission: RE | Disposition: A | Discharge status: Home / Self Care | Payer: Self-pay | Source: Ambulatory Visit | Attending: Orthopedic Surgery

## 2018-05-25 ENCOUNTER — Encounter (HOSPITAL_COMMUNITY): Payer: Self-pay

## 2018-05-25 ENCOUNTER — Other Ambulatory Visit: Payer: Self-pay

## 2018-05-25 ENCOUNTER — Ambulatory Visit (HOSPITAL_COMMUNITY)
Admission: RE | Admit: 2018-05-25 | Discharge: 2018-05-25 | Disposition: A | Discharge status: Home / Self Care | Payer: 59 | Source: Ambulatory Visit | Attending: Orthopedic Surgery | Admitting: Orthopedic Surgery

## 2018-05-25 DIAGNOSIS — M7541 Impingement syndrome of right shoulder: Secondary | ICD-10-CM | POA: Insufficient documentation

## 2018-05-25 DIAGNOSIS — M75111 Incomplete rotator cuff tear or rupture of right shoulder, not specified as traumatic: Secondary | ICD-10-CM | POA: Insufficient documentation

## 2018-05-25 DIAGNOSIS — S43431A Superior glenoid labrum lesion of right shoulder, initial encounter: Secondary | ICD-10-CM | POA: Diagnosis not present

## 2018-05-25 DIAGNOSIS — I252 Old myocardial infarction: Secondary | ICD-10-CM | POA: Insufficient documentation

## 2018-05-25 DIAGNOSIS — Z8673 Personal history of transient ischemic attack (TIA), and cerebral infarction without residual deficits: Secondary | ICD-10-CM | POA: Insufficient documentation

## 2018-05-25 DIAGNOSIS — Z951 Presence of aortocoronary bypass graft: Secondary | ICD-10-CM | POA: Insufficient documentation

## 2018-05-25 DIAGNOSIS — E1151 Type 2 diabetes mellitus with diabetic peripheral angiopathy without gangrene: Secondary | ICD-10-CM | POA: Insufficient documentation

## 2018-05-25 DIAGNOSIS — S46011A Strain of muscle(s) and tendon(s) of the rotator cuff of right shoulder, initial encounter: Secondary | ICD-10-CM | POA: Diagnosis not present

## 2018-05-25 DIAGNOSIS — M7551 Bursitis of right shoulder: Secondary | ICD-10-CM | POA: Insufficient documentation

## 2018-05-25 DIAGNOSIS — I2510 Atherosclerotic heart disease of native coronary artery without angina pectoris: Secondary | ICD-10-CM | POA: Insufficient documentation

## 2018-05-25 DIAGNOSIS — E785 Hyperlipidemia, unspecified: Secondary | ICD-10-CM | POA: Insufficient documentation

## 2018-05-25 DIAGNOSIS — Z79899 Other long term (current) drug therapy: Secondary | ICD-10-CM | POA: Insufficient documentation

## 2018-05-25 DIAGNOSIS — Z7982 Long term (current) use of aspirin: Secondary | ICD-10-CM | POA: Insufficient documentation

## 2018-05-25 DIAGNOSIS — I10 Essential (primary) hypertension: Secondary | ICD-10-CM | POA: Insufficient documentation

## 2018-05-25 DIAGNOSIS — X501XXA Overexertion from prolonged static or awkward postures, initial encounter: Secondary | ICD-10-CM | POA: Insufficient documentation

## 2018-05-25 DIAGNOSIS — Z87891 Personal history of nicotine dependence: Secondary | ICD-10-CM | POA: Insufficient documentation

## 2018-05-25 DIAGNOSIS — Z7984 Long term (current) use of oral hypoglycemic drugs: Secondary | ICD-10-CM | POA: Insufficient documentation

## 2018-05-25 DIAGNOSIS — Z791 Long term (current) use of non-steroidal anti-inflammatories (NSAID): Secondary | ICD-10-CM | POA: Insufficient documentation

## 2018-05-25 HISTORY — PX: SHOULDER ARTHROSCOPY: SHX128

## 2018-05-25 LAB — GLUCOSE, CAPILLARY
GLUCOSE-CAPILLARY: 101 mg/dL — AB (ref 70–99)
Glucose-Capillary: 122 mg/dL — ABNORMAL HIGH (ref 70–99)

## 2018-05-25 SURGERY — ARTHROSCOPY, SHOULDER
Anesthesia: General | Site: Shoulder | Laterality: Right

## 2018-05-25 MED ORDER — BUPIVACAINE LIPOSOME 1.3 % IJ SUSP
INTRAMUSCULAR | Status: DC | PRN
  Administered 2018-05-25: 10 mL

## 2018-05-25 MED ORDER — POVIDONE-IODINE 7.5 % EX SOLN
Freq: Once | CUTANEOUS | Status: DC
  Filled 2018-05-25: qty 118

## 2018-05-25 MED ORDER — EPHEDRINE 5 MG/ML INJ
INTRAVENOUS | Status: AC
  Filled 2018-05-25: qty 10

## 2018-05-25 MED ORDER — LIDOCAINE 2% (20 MG/ML) 5 ML SYRINGE
INTRAMUSCULAR | Status: DC | PRN
  Administered 2018-05-25: 100 mg via INTRAVENOUS

## 2018-05-25 MED ORDER — BUPIVACAINE HCL (PF) 0.5 % IJ SOLN
INTRAMUSCULAR | Status: DC | PRN
  Administered 2018-05-25: 14 mL

## 2018-05-25 MED ORDER — OXYCODONE-ACETAMINOPHEN 5-325 MG PO TABS: 1.0000 | ORAL_TABLET | ORAL | 0 refills | Status: DC | PRN

## 2018-05-25 MED ORDER — DEXAMETHASONE SODIUM PHOSPHATE 10 MG/ML IJ SOLN
INTRAMUSCULAR | Status: AC
  Filled 2018-05-25: qty 3

## 2018-05-25 MED ORDER — BUPIVACAINE LIPOSOME 1.3 % IJ SUSP: INTRAMUSCULAR | Status: DC | PRN

## 2018-05-25 MED ORDER — MIDAZOLAM HCL 2 MG/2ML IJ SOLN
INTRAMUSCULAR | Status: AC
  Administered 2018-05-25: 1 mg via INTRAVENOUS
  Filled 2018-05-25: qty 2

## 2018-05-25 MED ORDER — GLYCOPYRROLATE PF 0.2 MG/ML IJ SOSY
PREFILLED_SYRINGE | INTRAMUSCULAR | Status: AC
  Filled 2018-05-25: qty 1

## 2018-05-25 MED ORDER — ROCURONIUM BROMIDE 50 MG/5ML IV SOSY
PREFILLED_SYRINGE | INTRAVENOUS | Status: AC
  Filled 2018-05-25: qty 20

## 2018-05-25 MED ORDER — LIDOCAINE 2% (20 MG/ML) 5 ML SYRINGE
INTRAMUSCULAR | Status: AC
  Filled 2018-05-25: qty 15

## 2018-05-25 MED ORDER — MIDAZOLAM HCL 2 MG/2ML IJ SOLN
INTRAMUSCULAR | Status: AC
  Filled 2018-05-25: qty 2

## 2018-05-25 MED ORDER — SUGAMMADEX SODIUM 200 MG/2ML IV SOLN
INTRAVENOUS | Status: DC | PRN
  Administered 2018-05-25: 300 mg via INTRAVENOUS

## 2018-05-25 MED ORDER — FENTANYL CITRATE (PF) 100 MCG/2ML IJ SOLN
50.0000 ug | Freq: Once | INTRAMUSCULAR | Status: AC
  Administered 2018-05-25: 50 ug via INTRAVENOUS

## 2018-05-25 MED ORDER — FENTANYL CITRATE (PF) 250 MCG/5ML IJ SOLN
INTRAMUSCULAR | Status: AC
  Filled 2018-05-25: qty 5

## 2018-05-25 MED ORDER — SODIUM CHLORIDE 0.9 % IR SOLN
Status: DC | PRN
  Administered 2018-05-25 (×2): 3000 mL

## 2018-05-25 MED ORDER — FENTANYL CITRATE (PF) 100 MCG/2ML IJ SOLN
INTRAMUSCULAR | Status: AC
  Administered 2018-05-25: 50 ug via INTRAVENOUS
  Filled 2018-05-25: qty 2

## 2018-05-25 MED ORDER — EPHEDRINE SULFATE 50 MG/ML IJ SOLN
INTRAMUSCULAR | Status: DC | PRN
  Administered 2018-05-25 (×3): 10 mg via INTRAVENOUS

## 2018-05-25 MED ORDER — ONDANSETRON HCL 4 MG/2ML IJ SOLN
INTRAMUSCULAR | Status: AC
  Filled 2018-05-25: qty 6

## 2018-05-25 MED ORDER — PROPOFOL 10 MG/ML IV BOLUS
INTRAVENOUS | Status: DC | PRN
  Administered 2018-05-25: 110 mg via INTRAVENOUS

## 2018-05-25 MED ORDER — METHOCARBAMOL 500 MG PO TABS: 500.0000 mg | ORAL_TABLET | Freq: Three times a day (TID) | ORAL | 0 refills | Status: DC

## 2018-05-25 MED ORDER — MIDAZOLAM HCL 2 MG/2ML IJ SOLN
1.0000 mg | Freq: Once | INTRAMUSCULAR | Status: AC
  Administered 2018-05-25: 1 mg via INTRAVENOUS

## 2018-05-25 MED ORDER — PROPOFOL 10 MG/ML IV BOLUS
INTRAVENOUS | Status: AC
  Filled 2018-05-25: qty 20

## 2018-05-25 MED ORDER — LACTATED RINGERS IV SOLN
INTRAVENOUS | Status: DC
  Administered 2018-05-25: 11:00:00 via INTRAVENOUS

## 2018-05-25 MED ORDER — SODIUM CHLORIDE 0.9 % IV SOLN
INTRAVENOUS | Status: DC | PRN
  Administered 2018-05-25: 50 ug/min via INTRAVENOUS

## 2018-05-25 MED ORDER — CEFAZOLIN SODIUM-DEXTROSE 2-4 GM/100ML-% IV SOLN
2.0000 g | INTRAVENOUS | Status: AC
  Administered 2018-05-25: 2 g via INTRAVENOUS
  Filled 2018-05-25: qty 100

## 2018-05-25 MED ORDER — SUGAMMADEX SODIUM 200 MG/2ML IV SOLN
INTRAVENOUS | Status: AC
  Filled 2018-05-25: qty 6

## 2018-05-25 MED ORDER — ROCURONIUM BROMIDE 10 MG/ML (PF) SYRINGE
PREFILLED_SYRINGE | INTRAVENOUS | Status: DC | PRN
  Administered 2018-05-25: 50 mg via INTRAVENOUS

## 2018-05-25 MED ORDER — ONDANSETRON HCL 4 MG/2ML IJ SOLN
INTRAMUSCULAR | Status: DC | PRN
  Administered 2018-05-25: 4 mg via INTRAVENOUS

## 2018-05-25 MED ORDER — FENTANYL CITRATE (PF) 250 MCG/5ML IJ SOLN
INTRAMUSCULAR | Status: DC | PRN
  Administered 2018-05-25: 100 ug via INTRAVENOUS

## 2018-05-25 MED ORDER — MIDAZOLAM HCL 5 MG/5ML IJ SOLN
INTRAMUSCULAR | Status: DC | PRN
  Administered 2018-05-25: 2 mg via INTRAVENOUS

## 2018-05-25 SURGICAL SUPPLY — 50 items
BLADE SURG 11 STRL SS (BLADE) ×2 IMPLANT
BUR OVAL 4.0 (BURR) ×2 IMPLANT
CANISTER OMNI JUG 16 LITER (MISCELLANEOUS) IMPLANT
CANNULA 5.75X71 LONG (CANNULA) ×2 IMPLANT
CANNULA TWIST IN 8.25X7CM (CANNULA) IMPLANT
CHLORAPREP W/TINT 26ML (MISCELLANEOUS) ×2 IMPLANT
COVER WAND RF STERILE (DRAPES) ×2 IMPLANT
DRAPE IMP U-DRAPE 54X76 (DRAPES) ×2 IMPLANT
DRAPE INCISE IOBAN 66X45 STRL (DRAPES) ×2 IMPLANT
DRAPE ORTHO SPLIT 77X108 STRL (DRAPES) ×2
DRAPE STERI 35X30 U-POUCH (DRAPES) ×2 IMPLANT
DRAPE SURG 17X23 STRL (DRAPES) ×2 IMPLANT
DRAPE SURG ORHT 6 SPLT 77X108 (DRAPES) ×2 IMPLANT
DRAPE U-SHAPE 47X51 STRL (DRAPES) ×2 IMPLANT
DRSG PAD ABDOMINAL 8X10 ST (GAUZE/BANDAGES/DRESSINGS) ×2 IMPLANT
GAUZE SPONGE 4X4 12PLY STRL (GAUZE/BANDAGES/DRESSINGS) ×2 IMPLANT
GAUZE XEROFORM 1X8 LF (GAUZE/BANDAGES/DRESSINGS) ×2 IMPLANT
GLOVE BIO SURGEON STRL SZ7 (GLOVE) ×4 IMPLANT
GLOVE BIO SURGEON STRL SZ7.5 (GLOVE) ×2 IMPLANT
GLOVE BIOGEL PI IND STRL 7.0 (GLOVE) ×1 IMPLANT
GLOVE BIOGEL PI IND STRL 8 (GLOVE) ×1 IMPLANT
GLOVE BIOGEL PI INDICATOR 7.0 (GLOVE) ×1
GLOVE BIOGEL PI INDICATOR 8 (GLOVE) ×1
GOWN STRL REUS W/ TWL LRG LVL3 (GOWN DISPOSABLE) ×2 IMPLANT
GOWN STRL REUS W/TWL LRG LVL3 (GOWN DISPOSABLE) ×2
KIT BASIN OR (CUSTOM PROCEDURE TRAY) ×2 IMPLANT
KIT TURNOVER KIT B (KITS) ×2 IMPLANT
MANIFOLD NEPTUNE II (INSTRUMENTS) ×2 IMPLANT
NEEDLE SPNL 18GX3.5 QUINCKE PK (NEEDLE) ×2 IMPLANT
NS IRRIG 1000ML POUR BTL (IV SOLUTION) ×2 IMPLANT
PACK SHOULDER (CUSTOM PROCEDURE TRAY) ×2 IMPLANT
PACK UNIVERSAL I (CUSTOM PROCEDURE TRAY) IMPLANT
PAD ARMBOARD 7.5X6 YLW CONV (MISCELLANEOUS) ×4 IMPLANT
PROBE BIPOLAR ATHRO 135MM 90D (MISCELLANEOUS) ×2 IMPLANT
RESECTOR FULL RADIUS 4.2MM (BLADE) ×2 IMPLANT
SET ARTHROSCOPY TUBING (MISCELLANEOUS) ×1
SET ARTHROSCOPY TUBING LN (MISCELLANEOUS) ×1 IMPLANT
SLING ARM FOAM STRAP LRG (SOFTGOODS) ×2 IMPLANT
SLING ARM FOAM STRAP MED (SOFTGOODS) IMPLANT
SPONGE LAP 4X18 RFD (DISPOSABLE) IMPLANT
SUPPORT WRAP ARM LG (MISCELLANEOUS) ×2 IMPLANT
SUT ETHILON 3 0 PS 1 (SUTURE) ×2 IMPLANT
SUTURE TAPE FIBERLINK 1.3 LOOP (SUTURE) IMPLANT
SUTURETAPE FIBERLINK 1.3 LOOP (SUTURE)
TOWEL OR 17X24 6PK STRL BLUE (TOWEL DISPOSABLE) ×2 IMPLANT
TOWEL OR 17X26 10 PK STRL BLUE (TOWEL DISPOSABLE) ×2 IMPLANT
TUBE CONNECTING 12X1/4 (SUCTIONS) ×2 IMPLANT
TUBING ARTHROSCOPY IRRIG 16FT (MISCELLANEOUS) ×2 IMPLANT
WAND HAND CNTRL MULTIVAC 90 (MISCELLANEOUS) IMPLANT
WATER STERILE IRR 1000ML POUR (IV SOLUTION) ×2 IMPLANT

## 2018-05-25 NOTE — Anesthesia Procedure Notes (Signed)
Procedure Name: Intubation Date/Time: 05/25/2018 12:23 PM Performed by: Mariea Clonts, CRNA Pre-anesthesia Checklist: Patient identified, Emergency Drugs available, Suction available and Patient being monitored Patient Re-evaluated:Patient Re-evaluated prior to induction Oxygen Delivery Method: Circle System Utilized Preoxygenation: Pre-oxygenation with 100% oxygen Induction Type: IV induction Ventilation: Mask ventilation without difficulty and Oral airway inserted - appropriate to patient size Laryngoscope Size: Sabra Heck and 3 Grade View: Grade II Tube type: Oral Tube size: 8.0 mm Number of attempts: 1 Airway Equipment and Method: Stylet and Oral airway Placement Confirmation: ETT inserted through vocal cords under direct vision,  positive ETCO2 and breath sounds checked- equal and bilateral Tube secured with: Tape Dental Injury: Teeth and Oropharynx as per pre-operative assessment

## 2018-05-25 NOTE — H&P (Signed)
[name] is an 62 y.o. male.   Chief Complaint: Right shoulder pain and dysfunction HPI: The patient had an injury in April turning a wrench at work.  He felt a pop in his shoulder.  He went on to have continued pain and issues in the shoulder despite extensive conservative management.  An MRI revealed posterior superior labral tear.  Past Medical History:  Diagnosis Date  . Aortic valve sclerosis    aortic valve sclerosis  . Arthritis   . CAD (coronary artery disease)    Myoview 5/18: 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 no ischemia EF 27%  . Carotid artery disease (HCC)    chronic total occlusion LCCA, retrograde flow from LECA to LICA, ,, 9-73%ZHGD, s/p Right CEA 9242  . Diabetes mellitus without complication ([place])   . Dizziness   . Ejection fraction < 50%    EF 40%, echo 2006, no ICD needed  / EF 40%, echo, 11/2009  . Former cigarette smoker   . 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 Hyperlipidemia    Low HDL  . Hypertension   . Mitral regurgitation    mild, echo, 11/2009  . Myocardial infarction [facility])    "they told me i had a silent heart attack"  . Peripheral vascular disease ([place])    Dr. Erskin Burnet  . Sleep disorder    Dr Gwenette Greet, lose weight and consider sleep study later  . TIA (transient ischemic attack)    by history  . Volume overload     Past Surgical History:  Procedure Laterality Date  . CHOLECYSTECTOMY    . COLONOSCOPY  03/19/2009  . CORONARY ARTERY BYPASS GRAFT    . ENDARTERECTOMY Right 11/05/2016   Procedure: ENDARTERECTOMY RIGHT CAROTID;  Surgeon: [name], MD;  Location: [place];  Service: Vascular;  Laterality: Right;  . PATCH ANGIOPLASTY Right 11/05/2016   Procedure: RIGHT CAROTID ARTERY PATCH ANGIOPLASTY USING HEMASHIELD PLATINUM FINESSE PATCH;  Surgeon: [name], MD;  Location[facility];  Service: Vascular;  Laterality: Right;    Family History  Problem Relation Age of Onset  . Stroke Mother   . Diabetes Father   . COPD Father    . Arthritis Father   . Heart disease Father   . Coronary artery disease Unknown        Male 1st degree relative <50  . Diabetes Unknown        1st degree relative  . Stroke Unknown        Male 1st degree relative<50   Social History:  reports that he quit smoking about 16 years ago. He has never used smokeless tobacco. He reports that he drinks alcohol. He reports that he does not use drugs.  Allergies: No Known Allergies  Medications Prior to Admission  Medication Sig Dispense Refill  . aspirin 81 MG EC tablet Take 81 mg by mouth daily.      . bromocriptine (PARLODEL) 2.5 MG tablet Take 2.5 mg by mouth daily.    . carvedilol (COREG) 25 MG tablet TAKE 1 TABLET (25 MG TOTAL) BY MOUTH 2 (TWO) TIMES DAILY WITH A MEAL. (Patient taking differently: Take 25 mg by mouth 2 (two) times daily with a meal. ) 180 tablet 3  . Cholecalciferol (VITAMIN D3) 1000 UNITS tablet Take 1,000 Units by mouth daily.      . empagliflozin (JARDIANCE) 25 MG TABS tablet Take 25 mg by mouth daily. 30 tablet 11  .  linagliptin (TRADJENTA) 5 MG TABS tablet Take 1 tablet (5 mg total) by mouth daily. 30 tablet 11  . metFORMIN (GLUCOPHAGE-XR) 500 MG 24 hr tablet Take 1 tablet (500 mg total) daily with breakfast by mouth. 60 tablet 11  . pantoprazole (PROTONIX) 40 MG tablet TAKE 1 TABLET (40 MG TOTAL) BY MOUTH DAILY. (Patient taking differently: Take 40 mg by mouth daily. ) 90 tablet 3  . rosuvastatin (CRESTOR) 40 MG tablet TAKE 1 TABLET BY MOUTH EVERY DAY (Patient taking differently: Take 40 mg by mouth daily. ) 90 tablet 3  . spironolactone (ALDACTONE) 25 MG tablet Take 25 mg by mouth daily.     Marland Kitchen telmisartan (MICARDIS) 40 MG tablet TAKE 1 TABLET BY MOUTH EVERY DAY (Patient taking differently: Take 40 mg by mouth daily. ) 90 tablet 3  . traMADol (ULTRAM) 50 MG tablet Take 1-2 tablets (50-100 mg total) by mouth 2 (two) times daily as needed. (Patient taking differently: Take 50 mg by mouth 2 (two) times daily as needed for  moderate pain. ) 100 tablet 1  . glucose blood (ONETOUCH VERIO) test strip Use as instructed (Patient not taking: Reported on 05/19/2018) 50 each 11  . ibuprofen (ADVIL,MOTRIN) 600 MG tablet Take 1 tablet (600 mg total) by mouth as needed. (Patient not taking: Reported on 05/19/2018) 100 tablet 3  . ONETOUCH DELICA LANCETS 56O MISC USE AS DIRECTED AS NEEDED (Patient not taking: Reported on 05/19/2018) 100 each 3    Results for orders placed or performed during the hospital encounter of 05/25/18 (from the past 48 hour(s))  Glucose, capillary     Status: Abnormal   Collection Time: 05/25/18 10:17 AM  Result Value Ref Range   Glucose-Capillary 122 (H) 70 - 99 mg/dL   No results found.  Review of Systems  All other systems reviewed and are negative.   Blood pressure (!) 141/80, pulse 64, temperature 97.9 F (36.6 C), temperature source Oral, resp. rate 18, height 6\' 1"  (1.854 m), weight 105.3 kg, SpO2 97 %. Physical Exam  Constitutional: He is oriented to person, place, and time. He appears well-developed and well-nourished.  HENT:  Head: Atraumatic.  Eyes: EOM are normal.  Cardiovascular: Intact distal pulses.  Respiratory: Effort normal.  Musculoskeletal:  Right shoulder pain with range of motion.  Distally neurovascularly intact.  Neurological: He is alert and oriented to person, place, and time.  Skin: Skin is warm and dry.  Psychiatric: He has a normal mood and affect.     Assessment/Plan The patient had an injury in April turning a wrench at work.  He felt a pop in his shoulder.  He went on to have continued pain and issues in the shoulder despite extensive conservative management.  An MRI revealed posterior superior labral tear. Plan right shoulder arthroscopy with examination of the labrum, likely debridement, less likely repair, possible biceps tenotomy. Risks / benefits of surgery discussed Consent on chart  NPO for OR Preop antibiotics   Isabella Stalling,  MD 05/25/2018, 11:22 AM

## 2018-05-25 NOTE — Op Note (Signed)
Procedure(s): RIGHT ARTHROSCOPY SHOULDER LABRAL DEBRIDEMENT, rotator cuff debridement,  BICEPS TENOTOMY and subacromial decompression Procedure Note  AYDAN LEVITZ male 62 y.o. 05/25/2018   Preoperative diagnosis: #1 right shoulder posterior superior labral tear  Postoperative diagnosis: #1 right shoulder posterior superior labral tear #2 right shoulder partial articular sided supraspinatus tear #3 right shoulder impingement with unfavorable acromial anatomy.  Procedure(s) and Anesthesia Type:    #1 right shoulder arthroscopic debridement posterior superior labral tear, partial articular sided supraspinatus tear and subacromial bursitis    #2 right shoulder arthroscopic subacromial decompression.  Surgeon(s) and Role:    Tania Ade, MD - Primary     Surgeon: Isabella Stalling   Assistants: Jeanmarie Hubert PA-C [facility] was present and scrubbed throughout the procedure and was essential in positioning, assisting with the camera and instrumentation,, and closure)  Anesthesia: General endotracheal anesthesia with preoperative interscalene block given by the attending anesthesiologist    Procedure Detail  Estimated Blood Loss: Min         Drains: none  Blood Given: none         Specimens: none        Complications:  * No complications entered in OR log *         Disposition: PACU - hemodynamically stable.         Condition: stable    Procedure:   INDICATIONS FOR SURGERY: The patient is 62 y.o. male who has had about a 91-month history of right shoulder pain since an injury.  He has failed extensive conservative management.  He was noted to have extensive tearing of the posterior superior labrum.  Indicated for surgical management to try and decrease pain and restore function.  OPERATIVE FINDINGS: Examination under anesthesia: No stiffness or instability   DESCRIPTION OF PROCEDURE: The patient was identified in preoperative  holding area where I  personally marked the operative site after  verifying site, side, and procedure with the patient. An interscalene block was given by the attending anesthesiologist the holding area.  The patient was taken back to the operating room where general anesthesia was induced without complication and was placed in the beach-chair position with the back  elevated about 60 degrees and all extremities and head and neck carefully padded and  positioned.   The right upper extremity was then prepped and  draped in a standard sterile fashion. The appropriate time-out  procedure was carried out. The patient did receive IV antibiotics  within 30 minutes of incision.   A small posterior portal incision was made and the arthroscope was introduced into the joint. An anterior portal was then established above the subscapularis using needle localization. Small cannula was placed anteriorly. Diagnostic arthroscopy was then carried out.  He was noted to have intact subscapularis which was healthy-appearing.  The anterior labrum was intact.  The superior labrum was completely detached involving the entire origin of the biceps anchor.  This extended around posteriorly to about the 10 o'clock position.  The biceps tendon was pulled into the joint noted to have some tenosynovitis but no significant tearing.  The decision was made that given the extensive superior labral involvement a biceps tenotomy with debridement would be appropriate.  Therefore through a small anterior incision curved Mayo scissors were used to release the biceps from the superior labrum.  The arthroscopic shaver was then used to extensively debride the superior labrum.  The labrum was in fact hanging to the joint so much today is a small biter  to remove some of this tissue and then used a shaver and ArthroCare to resect back to stable rim.  Given the very small size of the peri-labral cyst with no concern for neural impingement I do not feel that formal  labral repair would be necessary.  The undersurface of the supraspinatus was examined and found to have between 10 and 20% partial articular sided tearing with detachment of the tendinous origin.  [place] was used to extensively debride this back to healthy tendon.  I then had to change the camera to the anterior portal and then shaved from the posterior portal to get to the back part of this adequately.  I did feel that adequate debridement had been obtained and no further repair was necessary.  Was also able to see the posterior labrum from the standpoint and again no formal repair was felt to be necessary.  The arthroscope was then introduced into the subacromial space a standard lateral portal was established with needle localization. The shaver was used through the lateral portal to perform extensive bursectomy. Coracoacromial ligament was examined and found to be severely frayed indicating chronic impingement and this correlated with the findings on the articular side of the rotator cuff.  I did feel that a subacromial decompression would be indicated in this setting to try and prevent further impingement and further damage to the rotator cuff..  The bursal side of the rotator cuff was noted to be completely intact.  The coracoacromial ligament was taken down off the anterior acromion with the ArthroCare exposing a hooked downsloping anterior acromial spur. A high-speed bur was then used through the lateral portal to take down the anterior acromial spur from lateral to medial in a standard acromioplasty.  The acromioplasty was also viewed from the lateral portal and the bur was used as necessary to ensure that the acromion was completely flat from posterior to anterior.  The arthroscopic equipment was removed from the joint and the portals were closed with 3-0 nylon in an interrupted fashion. Sterile dressings were then applied including Xeroform 4 x 4's ABDs and tape. The patient was then allowed to  awaken from general anesthesia, placed in a sling, transferred to the stretcher and taken to the recovery room in stable condition.   POSTOPERATIVE PLAN: The patient will be discharged home today and will followup in one week for suture removal and wound check.

## 2018-05-25 NOTE — Discharge Instructions (Signed)

## 2018-05-25 NOTE — Anesthesia Postprocedure Evaluation (Signed)
Anesthesia Post Note  Patient: DURAND WITTMEYER  Procedure(s) Performed: RIGHT ARTHROSCOPY SHOULDER LABRAL DEBRIDEMENT, rotator cuff debridement,  BICEPS TENOTOMY and subacromial decompression (Right Shoulder)     Patient location during evaluation: PACU Anesthesia Type: General Level of consciousness: awake and alert Pain management: pain level controlled Vital Signs Assessment: post-procedure vital signs reviewed and stable Respiratory status: spontaneous breathing, nonlabored ventilation, respiratory function stable and patient connected to nasal cannula oxygen Cardiovascular status: blood pressure returned to baseline and stable Postop Assessment: no apparent nausea or vomiting Anesthetic complications: no    Last Vitals:  Vitals:   05/25/18 1330 05/25/18 1345  BP: (!) 141/77 (!) 141/77  Pulse: 62 73  Resp: (!) 21 18  Temp:  36.7 C  SpO2: 94% 98%    Last Pain:  Vitals:   05/25/18 1345  TempSrc:   PainSc: 0-No pain                 Barnet Glasgow

## 2018-05-25 NOTE — Transfer of Care (Signed)
Immediate Anesthesia Transfer of Care Note  Patient: [name]  Procedure(s) Performed: RIGHT ARTHROSCOPY SHOULDER LABRAL DEBRIDEMENT, rotator cuff debridement,  BICEPS TENOTOMY and subacromial decompression (Right Shoulder)  Patient Location: PACU  Anesthesia Type:GA combined with regional for post-op pain  Level of Consciousness: awake, alert  and oriented  Airway & Oxygen Therapy: Patient Spontanous Breathing and Patient connected to nasal cannula oxygen  Post-op Assessment: Report given to RN and Post -op Vital signs reviewed and stable  Post vital signs: Reviewed and stable  Last Vitals:  Vitals Value Taken Time  BP 162/82 05/25/2018  1:25 PM  Temp    Pulse 77 05/25/2018  1:27 PM  Resp 22 05/25/2018  1:27 PM  SpO2 94 % 05/25/2018  1:27 PM  Vitals shown include unvalidated device data.  Last Pain:  Vitals:   05/25/18 1037  TempSrc:   PainSc: 1       Patients Stated Pain Goal: 3 (59/56/38 7564)  Complications: No apparent anesthesia complications

## 2018-05-25 NOTE — Anesthesia Procedure Notes (Signed)
Anesthesia Regional Block: Interscalene brachial plexus block   Pre-Anesthetic Checklist: ,, timeout performed, Correct Patient, Correct Site, Correct Laterality, Correct Procedure, Correct Position, site marked, Risks and benefits discussed, at surgeon's request and post-op pain management  Laterality: Upper and Right  Prep: Betadine, chloraprep, alcohol swabs       Needles:  Injection technique: Single-shot  Needle Type: Echogenic Needle     Needle Length: 5cm  Needle Gauge: 22     Additional Needles:   Procedures:,,,, ultrasound used (permanent image in chart),,,,  Narrative:  Start time: 05/25/2018 11:08 AM End time: 05/25/2018 11:16 AM  Performed by: Personally  Anesthesiologist: Barnet Glasgow, MD  Additional Notes: Block assessed prior to start of surgery. Pt tolerated procedure well.

## 2018-05-26 ENCOUNTER — Encounter (HOSPITAL_COMMUNITY): Payer: Self-pay | Admitting: Orthopedic Surgery

## 2018-06-07 DIAGNOSIS — S43431D Superior glenoid labrum lesion of right shoulder, subsequent encounter: Secondary | ICD-10-CM | POA: Diagnosis not present

## 2018-06-07 DIAGNOSIS — M25611 Stiffness of right shoulder, not elsewhere classified: Secondary | ICD-10-CM | POA: Diagnosis not present

## 2018-06-09 DIAGNOSIS — M25611 Stiffness of right shoulder, not elsewhere classified: Secondary | ICD-10-CM | POA: Diagnosis not present

## 2018-06-09 DIAGNOSIS — S43431D Superior glenoid labrum lesion of right shoulder, subsequent encounter: Secondary | ICD-10-CM | POA: Diagnosis not present

## 2018-06-13 DIAGNOSIS — M25611 Stiffness of right shoulder, not elsewhere classified: Secondary | ICD-10-CM | POA: Diagnosis not present

## 2018-06-13 DIAGNOSIS — S43431D Superior glenoid labrum lesion of right shoulder, subsequent encounter: Secondary | ICD-10-CM | POA: Diagnosis not present

## 2018-06-14 DIAGNOSIS — S43431D Superior glenoid labrum lesion of right shoulder, subsequent encounter: Secondary | ICD-10-CM | POA: Diagnosis not present

## 2018-06-14 DIAGNOSIS — M25611 Stiffness of right shoulder, not elsewhere classified: Secondary | ICD-10-CM | POA: Diagnosis not present

## 2018-06-17 DIAGNOSIS — Z23 Encounter for immunization: Secondary | ICD-10-CM | POA: Diagnosis not present

## 2018-07-03 ENCOUNTER — Other Ambulatory Visit: Payer: Self-pay | Admitting: Internal Medicine

## 2018-07-13 ENCOUNTER — Other Ambulatory Visit: Payer: Self-pay | Admitting: Internal Medicine

## 2018-07-21 ENCOUNTER — Encounter: Payer: Self-pay | Admitting: Endocrinology

## 2018-07-21 ENCOUNTER — Ambulatory Visit (INDEPENDENT_AMBULATORY_CARE_PROVIDER_SITE_OTHER): Payer: 59 | Admitting: Endocrinology

## 2018-07-21 VITALS — BP 100/60 | HR 58 | Ht 73.0 in | Wt 237.0 lb

## 2018-07-21 DIAGNOSIS — E1165 Type 2 diabetes mellitus with hyperglycemia: Secondary | ICD-10-CM | POA: Diagnosis not present

## 2018-07-21 LAB — POCT GLYCOSYLATED HEMOGLOBIN (HGB A1C): Hemoglobin A1C: 6.8 % — AB (ref 4.0–5.6)

## 2018-07-21 MED ORDER — BROMOCRIPTINE MESYLATE 5 MG PO CAPS: 5.0000 mg | ORAL_CAPSULE | Freq: Every day | ORAL | 3 refills | Status: DC

## 2018-07-21 NOTE — Patient Instructions (Addendum)
check your blood sugar once a day.  vary the time of day when you check, between before the 3 meals, and at bedtime.  also check if you have symptoms of your blood sugar being too high or too low.  please keep a record of the readings and bring it to your next appointment here (or you can bring the meter itself).  You can write it on any piece of paper.  please call us sooner if your blood sugar goes below 70, or if you have a lot of readings over 200.   I have sent a prescription to your pharmacy, to increase the bromocriptine. Please continue the same other diabetes medications.  Please come back for a follow-up appointment in 4-6 months.

## 2018-07-21 NOTE — Progress Notes (Signed)
Subjective:    Patient ID: [name], adult    DOB: 01-09-1956, 63 y.o.   MRN: [id number]  HPI Pt returns for f/u of diabetes mellitus: DM type: 2 Dx'ed: 8588 Complications: CVA and CAD Therapy: 4 oral meds.  DKA: never Severe hypoglycemia: never Pancreatitis: never Other: he has never been on insulin.   Interval history: pt states cbg's are well-controlled.  He says pharmacy will only fill bromocriptine at 2.5 mg qd.  pt states he feels well in general. Past Medical History:  Diagnosis Date  . Aortic valve sclerosis    aortic valve sclerosis  . Arthritis   . CAD (coronary artery disease)    Myoview 5/18: 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 no ischemia EF 27%  . Carotid artery disease (HCC)    chronic total occlusion LCCA, retrograde flow from LECA to LICA, ,, 5-02%DXAJ, s/p Right CEA 2878  . Diabetes mellitus without complication ([place])   . Dizziness   . Ejection fraction < 50%    EF 40%, echo 2006, no ICD needed  / EF 40%, echo, 11/2009  . Former cigarette smoker   . 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 Hyperlipidemia    Low HDL  . Hypertension   . Mitral regurgitation    mild, echo, 11/2009  . Myocardial infarction [facility])    "they told me i had a silent heart attack"  . Peripheral vascular disease ([place])    Dr. Erskin Burnet  . Sleep disorder    Dr Gwenette Greet, lose weight and consider sleep study later  . TIA (transient ischemic attack)    by history  . Volume overload     Past Surgical History:  Procedure Laterality Date  . CHOLECYSTECTOMY    . COLONOSCOPY  03/19/2009  . CORONARY ARTERY BYPASS GRAFT    . ENDARTERECTOMY Right 11/05/2016   Procedure: ENDARTERECTOMY RIGHT CAROTID;  Surgeon: [name], MD;  Location: [place];  Service: Vascular;  Laterality: Right;  . PATCH ANGIOPLASTY Right 11/05/2016   Procedure: RIGHT CAROTID ARTERY PATCH ANGIOPLASTY USING HEMASHIELD PLATINUM FINESSE PATCH;  Surgeon: [name], MD;  Location: [place];  Service:  Vascular;  Laterality: Right;  . SHOULDER ARTHROSCOPY Right 05/25/2018   Procedure: RIGHT ARTHROSCOPY SHOULDER LABRAL DEBRIDEMENT, rotator cuff debridement,  BICEPS TENOTOMY and subacromial decompression;  Surgeon: Tania Ade, MD;  Location: [place];  Service: Orthopedics;  Laterality: Right;    Social History   Socioeconomic History  . Marital status: Married    Spouse name: Not on file  . Number of children: Not on file  . Years of education: Not on file  . Highest education level: Not on file  Occupational History  . Not on file  Social Needs  . Financial resource strain: Not on file  . Food insecurity:    Worry: Not on file    Inability: Not on file  . Transportation needs:    Medical: Not on file    Non-medical: Not on file  Tobacco Use  . Smoking status: Former Smoker    Last attempt to quit: 05/05/2002    Years since quitting: 16.2  . Smokeless tobacco: Never Used  Substance and Sexual Activity  . Alcohol use: Yes    Alcohol/week: 0.0 standard drinks    Comment: beer every now and then  . Drug use: No  . Sexual activity: Yes  Lifestyle  . Physical activity:    Days per week: Not  on file    Minutes per session: Not on file  . Stress: Not on file  Relationships  . Social connections:    Talks on phone: Not on file    Gets together: Not on file    Attends religious service: Not on file    Active member of club or organization: Not on file    Attends meetings of clubs or organizations: Not on file    Relationship status: Not on file  . Intimate partner violence:    Fear of current or ex partner: Not on file    Emotionally abused: Not on file    Physically abused: Not on file    Forced sexual activity: Not on file  Other Topics Concern  . Not on file  Social History Narrative   No Regular exercise    Current Outpatient Medications on File Prior to Visit  Medication Sig Dispense Refill  . aspirin 81 MG EC tablet Take 81 mg by mouth daily.      .  carvedilol (COREG) 25 MG tablet TAKE 1 TABLET (25 MG TOTAL) BY MOUTH 2 (TWO) TIMES DAILY WITH A MEAL. (Patient taking differently: Take 25 mg by mouth 2 (two) times daily with a meal. ) 180 tablet 3  . Cholecalciferol (VITAMIN D3) 1000 UNITS tablet Take 1,000 Units by mouth daily.      . empagliflozin (JARDIANCE) 25 MG TABS tablet Take 25 mg by mouth daily. 30 tablet 11  . linagliptin (TRADJENTA) 5 MG TABS tablet Take 1 tablet (5 mg total) by mouth daily. 30 tablet 11  . metFORMIN (GLUCOPHAGE-XR) 500 MG 24 hr tablet TAKE 1 TABLET BY MOUTH EVERY DAY WITH BREAKFAST 90 tablet 3  . pantoprazole (PROTONIX) 40 MG tablet TAKE 1 TABLET (40 MG TOTAL) BY MOUTH DAILY. 90 tablet 0  . rosuvastatin (CRESTOR) 40 MG tablet TAKE 1 TABLET BY MOUTH EVERY DAY (Patient taking differently: Take 40 mg by mouth daily. ) 90 tablet 3  . spironolactone (ALDACTONE) 25 MG tablet Take 25 mg by mouth daily.     Marland Kitchen telmisartan (MICARDIS) 40 MG tablet TAKE 1 TABLET BY MOUTH EVERY DAY (Patient taking differently: Take 40 mg by mouth daily. ) 90 tablet 3  . traMADol (ULTRAM) 50 MG tablet Take 1-2 tablets (50-100 mg total) by mouth 2 (two) times daily as needed. (Patient taking differently: Take 50 mg by mouth 2 (two) times daily as needed for moderate pain. ) 100 tablet 1   No current facility-administered medications on file prior to visit.     No Known Allergies  Family History  Problem Relation Age of Onset  . Stroke Mother   . Diabetes Father   . COPD Father   . Arthritis Father   . Heart disease Father   . Coronary artery disease Unknown        Male 1st degree relative <50  . Diabetes Unknown        1st degree relative  . Stroke Unknown        Male 1st degree relative<50    BP 100/60 (BP Location: Left Arm, Patient Position: Sitting, Cuff Size: Large)   Pulse (!) 58   Ht 6\' 1"  (1.854 m)   Wt 237 lb (107.5 kg)   SpO2 94%   BMI 31.27 kg/m    Review of Systems She denies hypoglycemia.    Objective:    Physical Exam VITAL SIGNS:  See vs page GENERAL: no distress Pulses: dorsalis pedis intact bilat.  MSK: no deformity of the feet CV: 1+ bilat leg edema.   Skin:  no ulcer on the feet.  normal color and temp on the feet. Neuro: sensation is intact to touch on the feet.   Lab Results  Component Value Date   HGBA1C 6.8 (A) 07/21/2018    Lab Results  Component Value Date   CREATININE 0.98 05/22/2018   BUN 15 05/22/2018   NA 136 05/22/2018   K 4.6 05/22/2018   CL 106 05/22/2018   CO2 23 05/22/2018       Assessment & Plan:  Type 2 DM, with renal insuff: worse.   Edema: this limits rx options.   Patient Instructions  check your blood sugar once a day.  vary the time of day when you check, between before the 3 meals, and at bedtime.  also check if you have symptoms of your blood sugar being too high or too low.  please keep a record of the readings and bring it to your next appointment here (or you can bring the meter itself).  You can write it on any piece of paper.  please call us sooner if your blood sugar goes below 70, or if you have a lot of readings over 200.   I have sent a prescription to your pharmacy, to increase the bromocriptine. Please continue the same other diabetes medications.  Please come back for a follow-up appointment in 4-6 months.     worse

## 2018-07-30 ENCOUNTER — Other Ambulatory Visit: Payer: Self-pay | Admitting: Endocrinology

## 2019-01-19 ENCOUNTER — Ambulatory Visit: Payer: 59 | Admitting: Endocrinology

## 2019-02-19 ENCOUNTER — Other Ambulatory Visit: Payer: Self-pay | Admitting: Internal Medicine

## 2019-08-30 ENCOUNTER — Ambulatory Visit: Payer: Medicare Other | Attending: Internal Medicine

## 2019-08-30 DIAGNOSIS — Z23 Encounter for immunization: Secondary | ICD-10-CM

## 2019-08-30 NOTE — Progress Notes (Signed)
   Covid-19 Vaccination Clinic  Name:  MELVERN FAUBION    MRN: [id number] DOB: [birth year]  08/30/2019  Mr. Chesky was observed post Covid-19 immunization for 15 minutes without incidence. He was provided with Vaccine Information Sheet and instruction to access the V-Safe system.   Mr. Ginnetti was instructed to call 911 with any severe reactions post vaccine: Marland Kitchen Difficulty breathing  . Swelling of your face and throat  . A fast heartbeat  . A bad rash all over your body  . Dizziness and weakness    Immunizations Administered    Name Date Dose VIS Date Route   Pfizer COVID-19 Vaccine 08/30/2019  2:31 PM 0.3 mL 06/15/2019 Intramuscular   Manufacturer: [place]   Lot: [id number]   [place]: [id number]

## 2019-09-25 ENCOUNTER — Ambulatory Visit: Payer: Medicare Other | Attending: Internal Medicine

## 2019-09-25 DIAGNOSIS — Z23 Encounter for immunization: Secondary | ICD-10-CM

## 2019-09-25 NOTE — Progress Notes (Signed)
   Covid-19 Vaccination Clinic  Name:  [name]    MRN: [id number] DOB: 06/01/1956  09/25/2019  [name] was observed post Covid-19 immunization for 15 minutes without incident. He was provided with Vaccine Information Sheet and instruction to access the V-Safe system.   [name] was instructed to call 911 with any severe reactions post vaccine: Marland Kitchen Difficulty breathing  . Swelling of face and throat  . A fast heartbeat  . A bad rash all over body  . Dizziness and weakness   Immunizations Administered    Name Date Dose VIS Date Route   Pfizer COVID-19 Vaccine 09/25/2019  9:04 AM 0.3 mL 06/15/2019 Intramuscular   Manufacturer: [place]   Lot: [id number]   [place]: [id number]

## 2019-10-15 ENCOUNTER — Other Ambulatory Visit: Payer: Self-pay

## 2019-10-17 ENCOUNTER — Ambulatory Visit (INDEPENDENT_AMBULATORY_CARE_PROVIDER_SITE_OTHER): Payer: Medicare Other | Admitting: Endocrinology

## 2019-10-17 ENCOUNTER — Encounter: Payer: Self-pay | Admitting: Endocrinology

## 2019-10-17 ENCOUNTER — Other Ambulatory Visit: Payer: Self-pay

## 2019-10-17 VITALS — BP 110/60 | HR 62 | Ht 73.0 in | Wt 236.0 lb

## 2019-10-17 DIAGNOSIS — E1165 Type 2 diabetes mellitus with hyperglycemia: Secondary | ICD-10-CM

## 2019-10-17 LAB — POCT GLYCOSYLATED HEMOGLOBIN (HGB A1C): Hemoglobin A1C: 7.7 % — AB (ref 4.0–5.6)

## 2019-10-17 MED ORDER — RYBELSUS 7 MG PO TABS: 7.0000 mg | ORAL_TABLET | Freq: Every day | ORAL | 3 refills | Status: DC

## 2019-10-17 NOTE — Progress Notes (Signed)
Subjective:    Patient ID: [name], male    DOB: September 11, 1955, 64 y.o.   MRN: [id number]  HPI Pt returns for f/u of diabetes mellitus: DM type: 2 Dx'ed: 0000000 Complications: CVA and CAD Therapy: 4 oral meds.  DKA: never Severe hypoglycemia: never Pancreatitis: never Other: he has never been on insulin.   Interval history: pt states cbg's are well-controlled.  pt states he feels well in general.   Past Medical History:  Diagnosis Date  . Aortic valve sclerosis    aortic valve sclerosis  . Arthritis   . CAD (coronary artery disease)    Myoview 5/18: 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 no ischemia EF 27%  . Carotid artery disease (HCC)    chronic total occlusion LCCA, retrograde flow from LECA to LICA, ,, [id number], s/p Right CEA [id number]  . Diabetes mellitus without complication ([place])   . Dizziness   . Ejection fraction < 50%    EF 40%, echo 2006, no ICD needed  / EF 40%, echo, 11/2009  . Former cigarette smoker   . 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 Hyperlipidemia    Low HDL  . Hypertension   . Mitral regurgitation    mild, echo, 11/2009  . Myocardial infarction [facility])    "they told me i had a silent heart attack"  . Peripheral vascular disease ([place])    Dr. Erskin Burnet  . Sleep disorder    Dr Gwenette Greet, lose weight and consider sleep study later  . TIA (transient ischemic attack)    by history  . Volume overload     Past Surgical History:  Procedure Laterality Date  . CHOLECYSTECTOMY    . COLONOSCOPY  03/19/2009  . CORONARY ARTERY BYPASS GRAFT    . ENDARTERECTOMY Right 11/05/2016   Procedure: ENDARTERECTOMY RIGHT CAROTID;  Surgeon: [name], MD;  Location: [place];  Service: Vascular;  Laterality: Right;  . PATCH ANGIOPLASTY Right 11/05/2016   Procedure: RIGHT CAROTID ARTERY PATCH ANGIOPLASTY USING HEMASHIELD PLATINUM FINESSE PATCH;  Surgeon: [name], MD;  Location: [place];  Service: Vascular;  Laterality: Right;  . SHOULDER ARTHROSCOPY Right  05/25/2018   Procedure: RIGHT ARTHROSCOPY SHOULDER LABRAL DEBRIDEMENT, rotator cuff debridement,  BICEPS TENOTOMY and subacromial decompression;  Surgeon: Tania Ade, MD;  Location: [place];  Service: Orthopedics;  Laterality: Right;    Social History   Socioeconomic History  . Marital status: Married    Spouse name: Not on file  . Number of children: Not on file  . Years of education: Not on file  . Highest education level: Not on file  Occupational History  . Not on file  Tobacco Use  . Smoking status: Former Smoker    Quit date: 05/05/2002    Years since quitting: 17.4  . Smokeless tobacco: Never Used  Substance and Sexual Activity  . Alcohol use: Yes    Alcohol/week: 0.0 standard drinks    Comment: beer every now and then  . Drug use: No  . Sexual activity: Yes  Other Topics Concern  . Not on file  Social History Narrative   No Regular exercise   Social Determinants of Health   Financial Resource Strain:   . Difficulty of Paying Living Expenses:   Food Insecurity:   . Worried About Charity fundraiser in the Last Year:   . Arboriculturist in the Last Year:   Transportation Needs:   .  Lack of Transportation (Medical):   Marland Kitchen Lack of Transportation (Non-Medical):   Physical Activity:   . Days of Exercise per Week:   . Minutes of Exercise per Session:   Stress:   . Feeling of Stress :   Social Connections:   . Frequency of Communication with Friends and Family:   . Frequency of Social Gatherings with Friends and Family:   . Attends Religious Services:   . Active Member of Clubs or Organizations:   . Attends Archivist Meetings:   Marland Kitchen Marital Status:   Intimate Partner Violence:   . Fear of Current or Ex-Partner:   . Emotionally Abused:   Marland Kitchen Physically Abused:   . Sexually Abused:     Current Outpatient Medications on File Prior to Visit  Medication Sig Dispense Refill  . aspirin 81 MG EC tablet Take 81 mg by mouth daily.      . bromocriptine  (PARLODEL) 5 MG capsule Take 1 capsule (5 mg total) by mouth daily. 90 capsule 3  . carvedilol (COREG) 25 MG tablet TAKE 1 TABLET (25 MG TOTAL) BY MOUTH 2 (TWO) TIMES DAILY WITH A MEAL. (Patient taking differently: Take 25 mg by mouth 2 (two) times daily with a meal. ) 180 tablet 3  . Cholecalciferol (VITAMIN D3) 1000 UNITS tablet Take 1,000 Units by mouth daily.      . empagliflozin (JARDIANCE) 25 MG TABS tablet Take 25 mg by mouth daily. 30 tablet 11  . metFORMIN (GLUCOPHAGE-XR) 500 MG 24 hr tablet TAKE 1 TABLET BY MOUTH EVERY DAY WITH BREAKFAST 90 tablet 3  . pantoprazole (PROTONIX) 40 MG tablet TAKE 1 TABLET (40 MG TOTAL) BY MOUTH DAILY. 90 tablet 0  . rosuvastatin (CRESTOR) 40 MG tablet TAKE 1 TABLET BY MOUTH EVERY DAY (Patient taking differently: Take 40 mg by mouth daily. ) 90 tablet 3  . spironolactone (ALDACTONE) 25 MG tablet Take 25 mg by mouth daily.     Marland Kitchen telmisartan (MICARDIS) 40 MG tablet TAKE 1 TABLET BY MOUTH EVERY DAY (Patient taking differently: Take 40 mg by mouth daily. ) 90 tablet 3  . traMADol (ULTRAM) 50 MG tablet Take 1-2 tablets (50-100 mg total) by mouth 2 (two) times daily as needed. (Patient taking differently: Take 50 mg by mouth 2 (two) times daily as needed for moderate pain. ) 100 tablet 1   No current facility-administered medications on file prior to visit.    No Known Allergies  Family History  Problem Relation Age of Onset  . Stroke Mother   . Diabetes Father   . COPD Father   . Arthritis Father   . Heart disease Father   . Coronary artery disease Unknown        Male 1st degree relative <50  . Diabetes Unknown        1st degree relative  . Stroke Unknown        Male 1st degree relative<50    BP 110/60   Pulse 62   Ht 6\' 1"  (1.854 m)   Wt 236 lb (107 kg)   SpO2 93%   BMI 31.14 kg/m    Review of Systems He denies hypoglycemia    Objective:   Physical Exam VITAL SIGNS:  See vs page GENERAL: no distress Pulses: dorsalis pedis intact  bilat.   MSK: no deformity of the feet CV: trace bilat leg edema Skin:  no ulcer on the feet.  normal color and temp on the feet. Neuro: sensation is intact to  touch on the feet   Lab Results  Component Value Date   HGBA1C 7.7 (A) 10/17/2019      Assessment & Plan:  Type 2 DM, with CVA: worse.  Edema: This limits rx options.    Patient Instructions  check your blood sugar once a day.  vary the time of day when you check, between before the 3 meals, and at bedtime.  also check if you have symptoms of your blood sugar being too high or too low.  please keep a record of the readings and bring it to your next appointment here (or you can bring the meter itself).  You can write it on any piece of paper.  please call us sooner if your blood sugar goes below 70, or if you have a lot of readings over 200.   I have sent a prescription to your pharmacy, to change the Tradjena to "Rybelsus." Please continue the same other diabetes medications.  Please come back for a follow-up appointment in 3 months.

## 2019-10-17 NOTE — Patient Instructions (Addendum)
check your blood sugar once a day.  vary the time of day when you check, between before the 3 meals, and at bedtime.  also check if you have symptoms of your blood sugar being too high or too low.  please keep a record of the readings and bring it to your next appointment here (or you can bring the meter itself).  You can write it on any piece of paper.  please call us sooner if your blood sugar goes below 70, or if you have a lot of readings over 200.   I have sent a prescription to your pharmacy, to change the Tradjena to "Rybelsus." Please continue the same other diabetes medications.  Please come back for a follow-up appointment in 3 months.

## 2020-01-16 ENCOUNTER — Ambulatory Visit: Payer: BLUE CROSS/BLUE SHIELD | Admitting: Endocrinology

## 2021-03-03 ENCOUNTER — Ambulatory Visit (INDEPENDENT_AMBULATORY_CARE_PROVIDER_SITE_OTHER): Payer: Medicare Other

## 2021-03-03 ENCOUNTER — Ambulatory Visit (INDEPENDENT_AMBULATORY_CARE_PROVIDER_SITE_OTHER): Payer: Medicare Other | Admitting: Internal Medicine

## 2021-03-03 ENCOUNTER — Encounter: Payer: Self-pay | Admitting: Internal Medicine

## 2021-03-03 ENCOUNTER — Other Ambulatory Visit: Payer: Self-pay

## 2021-03-03 VITALS — BP 102/52 | HR 59 | Temp 97.8°F | Ht 72.0 in | Wt 224.4 lb

## 2021-03-03 DIAGNOSIS — G8929 Other chronic pain: Secondary | ICD-10-CM

## 2021-03-03 DIAGNOSIS — I739 Peripheral vascular disease, unspecified: Secondary | ICD-10-CM

## 2021-03-03 DIAGNOSIS — E1165 Type 2 diabetes mellitus with hyperglycemia: Secondary | ICD-10-CM | POA: Diagnosis not present

## 2021-03-03 DIAGNOSIS — I2510 Atherosclerotic heart disease of native coronary artery without angina pectoris: Secondary | ICD-10-CM

## 2021-03-03 DIAGNOSIS — I5022 Chronic systolic (congestive) heart failure: Secondary | ICD-10-CM

## 2021-03-03 DIAGNOSIS — M5441 Lumbago with sciatica, right side: Secondary | ICD-10-CM

## 2021-03-03 MED ORDER — METHYLPREDNISOLONE 4 MG PO TBPK: ORAL_TABLET | ORAL | 0 refills | Status: DC

## 2021-03-03 NOTE — Assessment & Plan Note (Signed)
On Jardiance

## 2021-03-03 NOTE — Assessment & Plan Note (Addendum)
Cont on Coreg, Aldactone, [place], [place]

## 2021-03-03 NOTE — Progress Notes (Signed)
Subjective:  Patient ID: [name], male    DOB: 1955-08-17  Age: 65 y.o. MRN: [id number]  CC: Establish Care   HPI [name] presents for claudication in the RLE - has to stop every 50 yards x 2 years. The pt was getting care at the [facility] C/o LBP, CAD, DM Pt had COVID in July - c/o dry cough - better  Outpatient Medications Prior to Visit  Medication Sig Dispense Refill   aspirin 81 MG chewable tablet Take 1 by mouth daily     carvedilol (COREG) 25 MG tablet TAKE 1 TABLET (25 MG TOTAL) BY MOUTH 2 (TWO) TIMES DAILY WITH A MEAL. (Patient taking differently: Take 25 mg by mouth 2 (two) times daily with a meal.) 180 tablet 3   Cholecalciferol (VITAMIN D3) 1000 UNITS tablet Take 1,000 Units by mouth daily.       empagliflozin (JARDIANCE) 25 MG TABS tablet Take 25 mg by mouth daily. 30 tablet 11   hydrALAZINE (APRESOLINE) 10 MG tablet Take 1 by mouth three times a day     isosorbide dinitrate (ISORDIL) 10 MG tablet Take 1 tablet by mouth 3 (three) times daily. Take 1 by mouth three times a day     pantoprazole (PROTONIX) 40 MG tablet TAKE 1 TABLET (40 MG TOTAL) BY MOUTH DAILY. 90 tablet 0   sacubitril-valsartan (ENTRESTO) 97-103 MG Take 1 tablet by mouth 2 (two) times daily. Take 1 by mouth twice a day     spironolactone (ALDACTONE) 25 MG tablet Take 25 mg by mouth daily.      traMADol (ULTRAM) 50 MG tablet Take 1-2 tablets (50-100 mg total) by mouth 2 (two) times daily as needed. (Patient taking differently: Take 50 mg by mouth 2 (two) times daily as needed for moderate pain.) 100 tablet 1   aspirin 81 MG EC tablet Take 81 mg by mouth daily.       bromocriptine (PARLODEL) 5 MG capsule Take 1 capsule (5 mg total) by mouth daily. 90 capsule 3   metFORMIN (GLUCOPHAGE-XR) 500 MG 24 hr tablet TAKE 1 TABLET BY MOUTH EVERY DAY WITH BREAKFAST 90 tablet 3   rosuvastatin (CRESTOR) 40 MG tablet TAKE 1 TABLET BY MOUTH EVERY DAY (Patient taking differently: Take 40 mg by mouth daily. ) 90 tablet 3    Semaglutide (RYBELSUS) 7 MG TABS Take 7 mg by mouth daily. 90 tablet 3   telmisartan (MICARDIS) 40 MG tablet TAKE 1 TABLET BY MOUTH EVERY DAY (Patient taking differently: Take 40 mg by mouth daily. ) 90 tablet 3   No facility-administered medications prior to visit.    ROS: Review of Systems  Constitutional:  Negative for appetite change, fatigue and unexpected weight change.  HENT:  Negative for congestion, nosebleeds, sneezing, sore throat and trouble swallowing.   Eyes:  Negative for itching and visual disturbance.  Respiratory:  Positive for cough. Negative for shortness of breath and wheezing.   Cardiovascular:  Negative for chest pain, palpitations and leg swelling.  Gastrointestinal:  Negative for abdominal distention, blood in stool, diarrhea and nausea.  Genitourinary:  Negative for frequency and hematuria.  Musculoskeletal:  Positive for arthralgias, back pain and gait problem. Negative for joint swelling and neck pain.  Skin:  Negative for rash.  Neurological:  Negative for dizziness, tremors, speech difficulty and weakness.  Psychiatric/Behavioral:  Negative for agitation, dysphoric mood, sleep disturbance and suicidal ideas. The patient is not nervous/anxious.    Objective:  BP (!) 102/52 (BP Location:  Left Arm)   Pulse (!) 59   Temp 97.8 F (36.6 C) (Oral)   Ht 6' (1.829 m)   Wt 224 lb 6.4 oz (101.8 kg)   SpO2 95%   BMI 30.43 kg/m   BP Readings from Last 3 Encounters:  03/03/21 (!) 102/52  10/17/19 110/60  07/21/18 100/60    Wt Readings from Last 3 Encounters:  03/03/21 224 lb 6.4 oz (101.8 kg)  10/17/19 236 lb (107 kg)  07/21/18 237 lb (107.5 kg)    Physical Exam Constitutional:      General: He is not in acute distress.    Appearance: He is well-developed. He is obese.     Comments: NAD  Eyes:     Conjunctiva/sclera: Conjunctivae normal.     Pupils: Pupils are equal, round, and reactive to light.  Neck:     Thyroid: No thyromegaly.     Vascular:  No JVD.  Cardiovascular:     Rate and Rhythm: Normal rate and regular rhythm.     Heart sounds: Normal heart sounds. No murmur heard.   No friction rub. No gallop.  Pulmonary:     Effort: Pulmonary effort is normal. No respiratory distress.     Breath sounds: Normal breath sounds. No rales.  Chest:     Chest wall: No tenderness.  Abdominal:     General: Bowel sounds are normal. There is no distension.     Palpations: Abdomen is soft. There is no mass.     Tenderness: There is no abdominal tenderness. There is no guarding or rebound.  Musculoskeletal:        General: No tenderness. Normal range of motion.     Cervical back: Normal range of motion.  Lymphadenopathy:     Cervical: No cervical adenopathy.  Skin:    General: Skin is warm and dry.     Findings: No rash.  Neurological:     Mental Status: He is alert and oriented to person, place, and time.     Cranial Nerves: No cranial nerve deficit.     Motor: No abnormal muscle tone.     Coordination: Coordination normal.     Gait: Gait normal.     Deep Tendon Reflexes: Reflexes are normal and symmetric.  Psychiatric:        Behavior: Behavior normal.        Thought Content: Thought content normal.        Judgment: Judgment normal.   Str leg elev (-) B. Limping No foot drop Pulses are ok. MS, DTRs ok   Lab Results  Component Value Date   WBC 7.9 05/22/2018   HGB 15.1 05/22/2018   HCT 45.9 05/22/2018   PLT 192 05/22/2018   GLUCOSE 108 (H) 05/22/2018   CHOL 123 12/16/2017   TRIG 104.0 12/16/2017   HDL 36.30 (L) 12/16/2017   LDLDIRECT 198.1 02/08/2013   LDLCALC 66 12/16/2017   ALT 23 11/02/2016   AST 22 11/02/2016   NA 136 05/22/2018   K 4.6 05/22/2018   CL 106 05/22/2018   CREATININE 0.98 05/22/2018   BUN 15 05/22/2018   CO2 23 05/22/2018   TSH 1.70 09/24/2015   PSA 0.80 02/08/2013   INR 1.07 11/02/2016   HGBA1C 7.7 (A) 10/17/2019    No results found.  Assessment & Plan:     [name], MD

## 2021-03-03 NOTE — Assessment & Plan Note (Addendum)
Worse: neuro vs vascular LS X ray Medrol pack Will check vascular doppler US. May need LS spine MRI R>L LLE

## 2021-03-03 NOTE — Assessment & Plan Note (Signed)
Seeing a cardiologist at the [facility]

## 2021-03-06 ENCOUNTER — Other Ambulatory Visit: Payer: Self-pay

## 2021-03-06 ENCOUNTER — Ambulatory Visit (HOSPITAL_COMMUNITY)
Admission: RE | Admit: 2021-03-06 | Discharge: 2021-03-06 | Disposition: A | Discharge status: Home / Self Care | Payer: Medicare Other | Source: Ambulatory Visit | Attending: Internal Medicine | Admitting: Internal Medicine

## 2021-03-06 ENCOUNTER — Other Ambulatory Visit (HOSPITAL_COMMUNITY): Payer: Self-pay | Admitting: Internal Medicine

## 2021-03-06 ENCOUNTER — Ambulatory Visit (INDEPENDENT_AMBULATORY_CARE_PROVIDER_SITE_OTHER)
Admission: RE | Admit: 2021-03-06 | Discharge: 2021-03-06 | Disposition: A | Discharge status: Home / Self Care | Payer: Medicare Other | Source: Ambulatory Visit | Attending: Internal Medicine | Admitting: Internal Medicine

## 2021-03-06 DIAGNOSIS — I739 Peripheral vascular disease, unspecified: Secondary | ICD-10-CM

## 2021-03-08 ENCOUNTER — Other Ambulatory Visit: Payer: Self-pay | Admitting: Internal Medicine

## 2021-03-08 DIAGNOSIS — I739 Peripheral vascular disease, unspecified: Secondary | ICD-10-CM

## 2021-03-31 ENCOUNTER — Ambulatory Visit (INDEPENDENT_AMBULATORY_CARE_PROVIDER_SITE_OTHER): Payer: Medicare Other | Admitting: Internal Medicine

## 2021-03-31 ENCOUNTER — Other Ambulatory Visit: Payer: Self-pay

## 2021-03-31 ENCOUNTER — Encounter: Payer: Self-pay | Admitting: Internal Medicine

## 2021-03-31 VITALS — BP 98/72 | HR 56 | Temp 98.3°F | Ht 72.0 in | Wt 222.4 lb

## 2021-03-31 DIAGNOSIS — I739 Peripheral vascular disease, unspecified: Secondary | ICD-10-CM

## 2021-03-31 DIAGNOSIS — I5022 Chronic systolic (congestive) heart failure: Secondary | ICD-10-CM | POA: Diagnosis not present

## 2021-03-31 DIAGNOSIS — I2510 Atherosclerotic heart disease of native coronary artery without angina pectoris: Secondary | ICD-10-CM | POA: Diagnosis not present

## 2021-03-31 DIAGNOSIS — E1165 Type 2 diabetes mellitus with hyperglycemia: Secondary | ICD-10-CM | POA: Diagnosis not present

## 2021-03-31 MED ORDER — TRAMADOL HCL 50 MG PO TABS: 50.0000 mg | ORAL_TABLET | Freq: Four times a day (QID) | ORAL | 1 refills | Status: DC | PRN

## 2021-03-31 NOTE — Progress Notes (Signed)
Subjective:  Patient ID: [name], male    DOB: Nov 11, 1955  Age: 65 y.o. MRN: [id number]  CC: Follow-up (4 week f/u)   HPI [name] presents for PAD, DM, HTN, CHF But continues to have claudication  Outpatient Medications Prior to Visit  Medication Sig Dispense Refill   aspirin 81 MG chewable tablet Take 1 by mouth daily     carvedilol (COREG) 25 MG tablet TAKE 1 TABLET (25 MG TOTAL) BY MOUTH 2 (TWO) TIMES DAILY WITH A MEAL. (Patient taking differently: Take 25 mg by mouth 2 (two) times daily with a meal.) 180 tablet 3   Cholecalciferol (VITAMIN D3) 1000 UNITS tablet Take 1,000 Units by mouth daily.       empagliflozin (JARDIANCE) 25 MG TABS tablet Take 25 mg by mouth daily. 30 tablet 11   hydrALAZINE (APRESOLINE) 10 MG tablet Take 1 by mouth three times a day     isosorbide dinitrate (ISORDIL) 10 MG tablet Take 1 tablet by mouth 3 (three) times daily. Take 1 by mouth three times a day     metFORMIN (GLUCOPHAGE-XR) 500 MG 24 hr tablet Take 1 tablet by mouth twice a day for Diabetes     pantoprazole (PROTONIX) 40 MG tablet TAKE 1 TABLET (40 MG TOTAL) BY MOUTH DAILY. 90 tablet 0   rosuvastatin (CRESTOR) 20 MG tablet Take one-half tablet by mouth once a day for Cholesterol     sacubitril-valsartan (ENTRESTO) 97-103 MG Take 1 tablet by mouth 2 (two) times daily. Take 1 by mouth twice a day     spironolactone (ALDACTONE) 25 MG tablet Take 25 mg by mouth daily.      traMADol (ULTRAM) 50 MG tablet Take 1-2 tablets (50-100 mg total) by mouth 2 (two) times daily as needed. (Patient taking differently: Take 50 mg by mouth 2 (two) times daily as needed for moderate pain.) 100 tablet 1   methylPREDNISolone (MEDROL DOSEPAK) 4 MG TBPK tablet As directed (Patient not taking: Reported on 03/31/2021) 21 tablet 0   No facility-administered medications prior to visit.    ROS: Review of Systems  Constitutional:  Negative for appetite change, fatigue and unexpected weight change.  HENT:   Negative for congestion, nosebleeds, sneezing, sore throat and trouble swallowing.   Eyes:  Negative for itching and visual disturbance.  Respiratory:  Negative for cough.   Cardiovascular:  Negative for chest pain, palpitations and leg swelling.  Gastrointestinal:  Negative for abdominal distention, blood in stool, diarrhea and nausea.  Genitourinary:  Negative for frequency and hematuria.  Musculoskeletal:  Positive for arthralgias and gait problem. Negative for back pain, joint swelling and neck pain.  Skin:  Negative for color change, rash and wound.  Neurological:  Negative for dizziness, tremors, speech difficulty and weakness.  Psychiatric/Behavioral:  Negative for agitation, dysphoric mood and sleep disturbance. The patient is not nervous/anxious.    Objective:  BP 98/72 (BP Location: Left Arm)   Pulse (!) 56   Temp 98.3 F (36.8 C) (Oral)   Ht 6' (1.829 m)   Wt 222 lb 6.4 oz (100.9 kg)   SpO2 96%   BMI 30.16 kg/m   BP Readings from Last 3 Encounters:  03/31/21 98/72  03/03/21 (!) 102/52  10/17/19 110/60    Wt Readings from Last 3 Encounters:  03/31/21 222 lb 6.4 oz (100.9 kg)  03/03/21 224 lb 6.4 oz (101.8 kg)  10/17/19 236 lb (107 kg)    Physical Exam Constitutional:  General: He is not in acute distress.    Appearance: He is well-developed. He is obese.     Comments: NAD  Eyes:     Conjunctiva/sclera: Conjunctivae normal.     Pupils: Pupils are equal, round, and reactive to light.  Neck:     Thyroid: No thyromegaly.     Vascular: No JVD.  Cardiovascular:     Rate and Rhythm: Normal rate and regular rhythm.     Heart sounds: Normal heart sounds. No murmur heard.   No friction rub. No gallop.  Pulmonary:     Effort: Pulmonary effort is normal. No respiratory distress.     Breath sounds: Normal breath sounds. No wheezing or rales.  Chest:     Chest wall: No tenderness.  Abdominal:     General: Bowel sounds are normal. There is no distension.      Palpations: Abdomen is soft. There is no mass.     Tenderness: There is no abdominal tenderness. There is no guarding or rebound.  Musculoskeletal:        General: No tenderness. Normal range of motion.     Cervical back: Normal range of motion.  Lymphadenopathy:     Cervical: No cervical adenopathy.  Skin:    General: Skin is warm and dry.     Findings: No rash.  Neurological:     Mental Status: He is alert and oriented to person, place, and time.     Cranial Nerves: No cranial nerve deficit.     Motor: No abnormal muscle tone.     Coordination: Coordination normal.     Gait: Gait normal.     Deep Tendon Reflexes: Reflexes are normal and symmetric.  Psychiatric:        Behavior: Behavior normal.        Thought Content: Thought content normal.        Judgment: Judgment normal.    Lab Results  Component Value Date   WBC 7.9 05/22/2018   HGB 15.1 05/22/2018   HCT 45.9 05/22/2018   PLT 192 05/22/2018   GLUCOSE 111 (H) 03/31/2021   CHOL 123 12/16/2017   TRIG 104.0 12/16/2017   HDL 36.30 (L) 12/16/2017   LDLDIRECT 198.1 02/08/2013   LDLCALC 66 12/16/2017   ALT 21 03/31/2021   AST 19 03/31/2021   NA 138 03/31/2021   K 4.9 03/31/2021   CL 102 03/31/2021   CREATININE 1.16 03/31/2021   BUN 19 03/31/2021   CO2 27 03/31/2021   TSH 1.70 09/24/2015   PSA 0.80 02/08/2013   INR 1.07 11/02/2016   HGBA1C 8.0 (H) 03/31/2021    VAS [place] ABI WITH/WO TBI  Result Date: 03/06/2021  LOWER EXTREMITY DOPPLER STUDY Patient Name:  [name]  Date of Exam:   03/06/2021 Medical Rec #: [id number]       Accession #:    [id number] Date of Birth: 1955-09-20        Patient Gender: M Patient Age:   75 years Exam Location:  [facility] Procedure:      VAS [place] ABI WITH/WO TBI Referring Phys: Tyrone Apple Snigdha Howser --------------------------------------------------------------------------------  Indications: Claudication, and peripheral artery disease. High Risk Factors: Hypertension,  hyperlipidemia, Diabetes, past history of                    smoking, coronary artery disease.  Performing Technologist: Delorise Shiner RVT  Examination Guidelines: A complete evaluation includes at minimum, Doppler waveform signals and systolic blood pressure reading  at the level of bilateral brachial, anterior tibial, and posterior tibial arteries, when vessel segments are accessible. Bilateral testing is considered an integral part of a complete examination. Photoelectric Plethysmograph (PPG) waveforms and toe systolic pressure readings are included as required and additional duplex testing as needed. Limited examinations for reoccurring indications may be performed as noted.  ABI Findings: +---------+------------------+-----+----------+--------+ Right    Rt Pressure (mmHg)IndexWaveform  Comment  +---------+------------------+-----+----------+--------+ Brachial 122                                       +---------+------------------+-----+----------+--------+ PTA      62                0.50 monophasic         +---------+------------------+-----+----------+--------+ DP       67                0.54 monophasic         +---------+------------------+-----+----------+--------+ Great Toe25                0.20                    +---------+------------------+-----+----------+--------+ +---------+------------------+-----+--------+-------+ Left     Lt Pressure (mmHg)IndexWaveformComment +---------+------------------+-----+--------+-------+ Brachial 123                                    +---------+------------------+-----+--------+-------+ PTA      118               0.96 biphasic        +---------+------------------+-----+--------+-------+ DP       123               1.00 biphasic        +---------+------------------+-----+--------+-------+ Great Toe105               0.85                 +---------+------------------+-----+--------+-------+  +-------+-----------+-----------+------------+------------+ ABI/TBIToday's ABIToday's TBIPrevious ABIPrevious TBI +-------+-----------+-----------+------------+------------+ Right  0.54       0.20                                +-------+-----------+-----------+------------+------------+ Left   1.00       0.85                                +-------+-----------+-----------+------------+------------+  Summary: Right: Resting right ankle-brachial index indicates moderate right lower extremity arterial disease. The right toe-brachial index is abnormal. Left: Resting left ankle-brachial index is within normal range. No evidence of significant left lower extremity arterial disease. The left toe-brachial index is normal.  *See table(s) above for measurements and observations.  Electronically signed by Deitra Mayo MD on 03/06/2021 at 4:22:39 PM.    Final    VAS [place] LOWER EXTREMITY ARTERIAL DUPLEX  Result Date: 03/06/2021 LOWER EXTREMITY ARTERIAL DUPLEX STUDY Patient Name:  CAROLOS FECHER  Date of Exam:   03/06/2021 Medical Rec #: [id number]       Accession #:    [id number] Date of Birth: 11/18/1955        Patient Gender: M Patient Age:   79 years Exam Location:  [facility] Procedure:  VAS [place] LOWER EXTREMITY ARTERIAL DUPLEX Referring Phys: Tyrone Apple Kylian Loh --------------------------------------------------------------------------------  Indications: Claudication, and peripheral artery disease. High Risk Factors: Hypertension, hyperlipidemia, Diabetes, past history of                    smoking, coronary artery disease.  Current ABI: Right: 0.54, Left: 1.0 Performing Technologist: Delorise Shiner RVT  Examination Guidelines: A complete evaluation includes B-mode imaging, spectral Doppler, color Doppler, and power Doppler as needed of all accessible portions of each vessel. Bilateral testing is considered an integral part of a complete examination. Limited examinations for  reoccurring indications may be performed as noted.  +----------+--------+-----+---------------+----------+--------+ RIGHT     PSV cm/sRatioStenosis       Waveform  Comments +----------+--------+-----+---------------+----------+--------+ EIA Distal48                          monophasic         +----------+--------+-----+---------------+----------+--------+ CFA Prox  46                          monophasic         +----------+--------+-----+---------------+----------+--------+ CFA Distal26                          monophasic         +----------+--------+-----+---------------+----------+--------+ DFA       14                          monophasic         +----------+--------+-----+---------------+----------+--------+ SFA Prox  18                          monophasic         +----------+--------+-----+---------------+----------+--------+ SFA Mid   160          50-74% stenosismonophasic         +----------+--------+-----+---------------+----------+--------+ SFA Distal0            occluded       absent             +----------+--------+-----+---------------+----------+--------+ POP Prox  15                          monophasic         +----------+--------+-----+---------------+----------+--------+ POP Distal15                          monophasic         +----------+--------+-----+---------------+----------+--------+ ATA Distal32                          monophasic         +----------+--------+-----+---------------+----------+--------+ PTA Distal17                          monophasic         +----------+--------+-----+---------------+----------+--------+ A focal velocity elevation of 160 cm/s was obtained at Mid femoral artery with a VR of 9.4. Findings are characteristic of 50-74% stenosis.  Summary: Right: Total occlusion noted in the superficial femoral artery. 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  See table(s) above  for measurements and observations. Electronically signed by Deitra Mayo MD on 03/06/2021 at 6:30:36 PM.    Final  Assessment & Plan:   Problem List Items Addressed This Visit     CAD (coronary artery disease)    No angina.  Continue with Coreg, Micardis, Aldactone, aspirin      Relevant Medications   rosuvastatin (CRESTOR) 20 MG tablet   Chronic systolic CHF (congestive heart failure) (HCC)    Cont on Coreg, Aldactone, Entresto, Jardiance      Relevant Medications   rosuvastatin (CRESTOR) 20 MG tablet   Claudication (HCC)    PAD - arterial Doppler report was reviewed Vasc surgery consult is pending. No change in sx's       Relevant Medications   traMADol (ULTRAM) 50 MG tablet   Diabetes type 2, uncontrolled (HCC) - Primary    Check A1c  On Jardiance      Relevant Medications   metFORMIN (GLUCOPHAGE-XR) 500 MG 24 hr tablet   rosuvastatin (CRESTOR) 20 MG tablet   Other Relevant Orders   Hemoglobin A1c (Completed)   Comprehensive metabolic panel (Completed)      Follow-up: No follow-ups on file.  [name], MD

## 2021-03-31 NOTE — Assessment & Plan Note (Signed)
Cont on Coreg, Aldactone, [place], [place]

## 2021-03-31 NOTE — Assessment & Plan Note (Signed)
Check A1c  On Jardiance

## 2021-03-31 NOTE — Assessment & Plan Note (Addendum)
PAD - arterial Doppler report was reviewed Vasc surgery consult is pending. No change in sx's

## 2021-04-01 LAB — HEMOGLOBIN A1C: Hgb A1c MFr Bld: 8 % — ABNORMAL HIGH (ref 4.6–6.5)

## 2021-04-01 LAB — COMPREHENSIVE METABOLIC PANEL
ALT: 21 U/L (ref 0–53)
AST: 19 U/L (ref 0–37)
Albumin: 4.3 g/dL (ref 3.5–5.2)
Alkaline Phosphatase: 48 U/L (ref 39–117)
BUN: 19 mg/dL (ref 6–23)
CO2: 27 mEq/L (ref 19–32)
Calcium: 10 mg/dL (ref 8.4–10.5)
Chloride: 102 mEq/L (ref 96–112)
Creatinine, Ser: 1.16 mg/dL (ref 0.40–1.50)
GFR: 65.58 mL/min (ref >60.00)
Glucose, Bld: 111 mg/dL — ABNORMAL HIGH (ref 70–99)
Potassium: 4.9 mEq/L (ref 3.5–5.1)
Sodium: 138 mEq/L (ref 135–145)
Total Bilirubin: 0.7 mg/dL (ref 0.2–1.2)
Total Protein: 7.8 g/dL (ref 6.0–8.3)

## 2021-04-02 NOTE — Assessment & Plan Note (Signed)
No angina.  Continue with Coreg, Micardis, Aldactone, aspirin

## 2021-04-07 ENCOUNTER — Ambulatory Visit: Payer: BLUE CROSS/BLUE SHIELD | Admitting: Vascular Surgery

## 2021-04-16 ENCOUNTER — Other Ambulatory Visit: Payer: Self-pay

## 2021-04-16 ENCOUNTER — Ambulatory Visit (INDEPENDENT_AMBULATORY_CARE_PROVIDER_SITE_OTHER): Payer: Medicare Other | Admitting: Endocrinology

## 2021-04-16 VITALS — BP 126/74 | HR 65 | Ht 72.0 in | Wt 226.8 lb

## 2021-04-16 DIAGNOSIS — E1165 Type 2 diabetes mellitus with hyperglycemia: Secondary | ICD-10-CM

## 2021-04-16 DIAGNOSIS — I2510 Atherosclerotic heart disease of native coronary artery without angina pectoris: Secondary | ICD-10-CM

## 2021-04-16 LAB — POCT GLYCOSYLATED HEMOGLOBIN (HGB A1C): Hemoglobin A1C: 7.7 % — AB (ref 4.0–5.6)

## 2021-04-16 MED ORDER — LINAGLIPTIN 5 MG PO TABS: 5.0000 mg | ORAL_TABLET | Freq: Every day | ORAL | 3 refills | Status: DC

## 2021-04-16 NOTE — Progress Notes (Signed)
Subjective:    Patient ID: [name], male    DOB: 1956/06/20, 65 y.o.   MRN: [id number]  HPI Pt returns for f/u of diabetes mellitus: DM type: 2 Dx'ed: 7262 Complications: CVA and CAD Therapy: 3 oral meds.  DKA: never Severe hypoglycemia: never Pancreatitis: never Other: he has never been on insulin.   Interval history: pt states cbg's vary from 90-200.  pt states he feels well in general.  He takes only metformin and Jardiance.   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 Erskin Burnet   Sleep disorder    Dr Gwenette Greet, lose weight and consider sleep study later   TIA (transient ischemic attack)    by history   Volume overload     Past Surgical History:  Procedure Laterality Date   CHOLECYSTECTOMY     COLONOSCOPY  03/19/2009   CORONARY ARTERY BYPASS GRAFT     ENDARTERECTOMY Right 11/05/2016   Procedure: ENDARTERECTOMY RIGHT CAROTID;  Surgeon: [name], MD;  Location: [place];  Service: Vascular;  Laterality: Right;   PATCH ANGIOPLASTY Right 11/05/2016   Procedure: RIGHT CAROTID ARTERY PATCH ANGIOPLASTY USING [place];  Surgeon: [name], MD;  Location: [place];  Service: Vascular;  Laterality: Right;   SHOULDER ARTHROSCOPY  Right 05/25/2018   Procedure: RIGHT ARTHROSCOPY SHOULDER LABRAL DEBRIDEMENT, rotator cuff debridement,  BICEPS TENOTOMY and subacromial decompression;  Surgeon: Tania Ade, MD;  Location: [place];  Service: Orthopedics;  Laterality: Right;    Social History   Socioeconomic History   Marital status: Married    Spouse name: Not on file   Number of children: Not on file   Years of education: Not on file   Highest education level: Not on file  Occupational History   Not on file  Tobacco Use   Smoking status: Former    Types: Cigarettes    Quit date: 05/05/2002    Years since quitting: 18.9   Smokeless tobacco: Never  Vaping Use   Vaping Use: Never used  Substance and Sexual Activity   Alcohol use: Yes    Alcohol/week: 0.0 standard drinks    Comment: beer every now and then   Drug use: No   Sexual activity: Yes  Other Topics Concern   Not on file  Social History Narrative   No Regular exercise   Social Determinants of Health   Financial Resource Strain: Not on file  Food Insecurity: Not on file  Transportation Needs: Not on file  Physical Activity: Not on file  Stress:  Not on file  Social Connections: Not on file  Intimate Partner Violence: Not on file    Current Outpatient Medications on File Prior to Visit  Medication Sig Dispense Refill   aspirin 81 MG chewable tablet Take 1 by mouth daily     carvedilol (COREG) 25 MG tablet TAKE 1 TABLET (25 MG TOTAL) BY MOUTH 2 (TWO) TIMES DAILY WITH A MEAL. (Patient taking differently: Take 25 mg by mouth 2 (two) times daily with a meal.) 180 tablet 3   Cholecalciferol (VITAMIN D3) 1000 UNITS tablet Take 1,000 Units by mouth daily.       empagliflozin (JARDIANCE) 25 MG TABS tablet Take 25 mg by mouth daily. 30 tablet 11   hydrALAZINE (APRESOLINE) 10 MG tablet Take 1 by mouth three times a day     isosorbide dinitrate (ISORDIL) 10 MG tablet Take 1 tablet by mouth 3 (three) times daily. Take 1 by mouth three times a day      metFORMIN (GLUCOPHAGE-XR) 500 MG 24 hr tablet Take 1 tablet by mouth twice a day for Diabetes     pantoprazole (PROTONIX) 40 MG tablet TAKE 1 TABLET (40 MG TOTAL) BY MOUTH DAILY. 90 tablet 0   rosuvastatin (CRESTOR) 20 MG tablet Take one-half tablet by mouth once a day for Cholesterol     sacubitril-valsartan (ENTRESTO) 97-103 MG Take 1 tablet by mouth 2 (two) times daily. Take 1 by mouth twice a day     spironolactone (ALDACTONE) 25 MG tablet Take 25 mg by mouth daily.      traMADol (ULTRAM) 50 MG tablet Take 1-2 tablets (50-100 mg total) by mouth every 6 (six) hours as needed. 20 tablet 1   No current facility-administered medications on file prior to visit.    No Known Allergies  Family History  Problem Relation Age of Onset   Stroke Mother    Diabetes Father    COPD Father    Arthritis Father    Heart disease Father    Coronary artery disease Unknown        Male 1st degree relative <50   Diabetes Unknown        1st degree relative   Stroke Unknown        Male 1st degree relative<50    BP 126/74 (BP Location: Left Arm, Patient Position: Sitting, Cuff Size: Normal)   Pulse 65   Ht 6' (1.829 m)   Wt 226 lb 12.8 oz (102.9 kg)   SpO2 95%   BMI 30.76 kg/m    Review of Systems     Objective:   Physical Exam Pulses: dorsalis pedis intact bilat.   MSK: no deformity of the feet CV: no leg edema Skin:  no ulcer on the feet.  normal color and temp on the feet. Neuro: sensation is intact to touch on the feet.     Lab Results  Component Value Date   HGBA1C 7.7 (A) 04/16/2021   Lab Results  Component Value Date   CREATININE 1.16 03/31/2021   BUN 19 03/31/2021   NA 138 03/31/2021   K 4.9 03/31/2021   CL 102 03/31/2021   CO2 27 03/31/2021      Assessment & Plan:  Type 2 DM: uncontrolled  Patient Instructions  check your blood sugar once a day.  vary the time of day when you check, between before the 3 meals, and at bedtime.  also check if you have symptoms of your  blood sugar being too high or too low.  please keep a record of the readings and bring it to your next appointment here (or you can bring the meter itself).  You can write it on any piece of paper.  please call us sooner if your blood sugar goes below 70, or if you have a lot of readings over 200.   I have sent a prescription to your pharmacy, to resume the [place].   Please continue the same other diabetes medications.  Please come back for a follow-up appointment in 3 months.

## 2021-04-16 NOTE — Patient Instructions (Addendum)
check your blood sugar once a day.  vary the time of day when you check, between before the 3 meals, and at bedtime.  also check if you have symptoms of your blood sugar being too high or too low.  please keep a record of the readings and bring it to your next appointment here (or you can bring the meter itself).  You can write it on any piece of paper.  please call us sooner if your blood sugar goes below 70, or if you have a lot of readings over 200.   I have sent a prescription to your pharmacy, to resume the [place].   Please continue the same other diabetes medications.  Please come back for a follow-up appointment in 3 months.

## 2021-04-17 ENCOUNTER — Encounter: Payer: Self-pay | Admitting: Endocrinology

## 2021-04-17 NOTE — Telephone Encounter (Signed)
OV notes has been faxed internally to Dr Marjory Lies with [facility] to [phone]

## 2021-04-19 NOTE — Progress Notes (Signed)
[facility] [place]  ASSESSMENT / PLAN: [name] is a 65 y.o. 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  Patient counseled patients with chronic limb threatening ischemia have an annual risk of cardiovascular mortality of 25% and a high risk of amputation.   Recommend the following which can slow the progression of atherosclerosis and reduce the risk of major adverse cardiac / limb events:  Complete cessation from all tobacco products. Blood glucose control with goal A1c < 7%. Blood pressure control with goal blood pressure < 140/90 mmHg. Lipid reduction therapy with goal LDL-C <100 mg/dL (<70 if symptomatic from PAD).  Aspirin 81mg  PO QD.  Atorvastatin 40-80mg  PO QD (or other "high intensity" statin therapy).  Plan CT angiogram of abdomen / pelvis with bilateral runoff to plan intervention. I will call the patient with the results and "next steps" to therapy.   CHIEF COMPLAINT: Right leg pain  HISTORY OF PRESENT ILLNESS: [name] is a 65 y.o. male who presents to clinic for evaluation of right lower extremity pain.  The patient reports a fairly classic history of short distance claudication (50 yards) for the past several months.  This is deteriorated.  He now has pain at rest, which resolves with dangling his foot over the edge of the bed.  Thankfully, he has no sores about his foot.  He has an extensive cardiovascular history (see below).  VASCULAR SURGICAL HISTORY: Right carotid endarterectomy 11/05/16 with Dr. Donnetta Hutching.  VASCULAR RISK FACTORS: Negative history of stroke / transient ischemic attack. Positive history of coronary artery disease. + history of PCI. + history of CABG.  Positive history of diabetes mellitus. Last A1c 7.7. Positive history of smoking. Not actively smoking. Positive history of hypertension.  Negative history of chronic kidney disease.  Last GFR 65.  Negative history of  chronic obstructive pulmonary disease.  FUNCTIONAL STATUS: ECOG performance status: (0) Fully active, able to carry on all predisease performance without restriction Ambulatory status: Ambulatory within the community with limits  Past Medical History:  Diagnosis Date   Aortic valve sclerosis    aortic valve sclerosis   Arthritis    CAD (coronary artery disease)    Myoview 5/18: 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 no ischemia EF 27%   Carotid artery disease (HCC)    chronic total occlusion LCCA, retrograde flow from LECA to LICA, ,, 2-02%RKYH, s/p Right CEA 0623   Diabetes mellitus without complication ([place])    Dizziness    Ejection fraction < 50%    EF 40%, echo 2006, no ICD needed  / EF 40%, echo, 11/2009   Former cigarette smoker    Hx of CABG july 2003   01/2002   Hyperlipidemia    Low HDL   Hypertension    Mitral regurgitation    mild, echo, 11/2009   Myocardial infarction [facility])    "they told me i had a silent heart attack"   Peripheral vascular disease ([place])    Dr. Erskin Burnet   Sleep disorder    Dr Gwenette Greet, lose weight and consider sleep study later   TIA (transient ischemic attack)    by history   Volume overload     Past Surgical History:  Procedure Laterality Date   CHOLECYSTECTOMY     COLONOSCOPY  03/19/2009   CORONARY ARTERY BYPASS GRAFT     ENDARTERECTOMY Right 11/05/2016   Procedure: ENDARTERECTOMY RIGHT CAROTID;  Surgeon: Donnetta Hutching,  Arvilla Meres, MD;  Location: [place];  Service: Vascular;  Laterality: Right;   PATCH ANGIOPLASTY Right 11/05/2016   Procedure: RIGHT CAROTID ARTERY PATCH ANGIOPLASTY USING HEMASHIELD PLATINUM FINESSE PATCH;  Surgeon: [name], MD;  Location: [place];  Service: Vascular;  Laterality: Right;   SHOULDER ARTHROSCOPY Right 05/25/2018   Procedure: RIGHT ARTHROSCOPY SHOULDER LABRAL DEBRIDEMENT, rotator cuff debridement,  BICEPS TENOTOMY and subacromial decompression;  Surgeon: Tania Ade, MD;  Location: [place];  Service:  Orthopedics;  Laterality: Right;    Family History  Problem Relation Age of Onset   Stroke Mother    Diabetes Father    COPD Father    Arthritis Father    Heart disease Father    Coronary artery disease Unknown        Male 1st degree relative <50   Diabetes Unknown        1st degree relative   Stroke Unknown        Male 1st degree relative<50    Social History   Socioeconomic History   Marital status: Married    Spouse name: Not on file   Number of children: Not on file   Years of education: Not on file   Highest education level: Not on file  Occupational History   Not on file  Tobacco Use   Smoking status: Former    Types: Cigarettes    Quit date: 05/05/2002    Years since quitting: 18.9   Smokeless tobacco: Never  Vaping Use   Vaping Use: Never used  Substance and Sexual Activity   Alcohol use: Yes    Alcohol/week: 0.0 standard drinks    Comment: beer every now and then   Drug use: No   Sexual activity: Yes  Other Topics Concern   Not on file  Social History Narrative   No Regular exercise   Social Determinants of Health   Financial Resource Strain: Not on file  Food Insecurity: Not on file  Transportation Needs: Not on file  Physical Activity: Not on file  Stress: Not on file  Social Connections: Not on file  Intimate Partner Violence: Not on file    No Known Allergies  Current Outpatient Medications  Medication Sig Dispense Refill   aspirin 81 MG chewable tablet Take 1 by mouth daily     carvedilol (COREG) 25 MG tablet TAKE 1 TABLET (25 MG TOTAL) BY MOUTH 2 (TWO) TIMES DAILY WITH A MEAL. (Patient taking differently: Take 25 mg by mouth 2 (two) times daily with a meal.) 180 tablet 3   Cholecalciferol (VITAMIN D3) 1000 UNITS tablet Take 1,000 Units by mouth daily.       empagliflozin (JARDIANCE) 25 MG TABS tablet Take 25 mg by mouth daily. 30 tablet 11   hydrALAZINE (APRESOLINE) 10 MG tablet Take 1 by mouth three times a day     isosorbide dinitrate  (ISORDIL) 10 MG tablet Take 1 tablet by mouth 3 (three) times daily. Take 1 by mouth three times a day     linagliptin (TRADJENTA) 5 MG TABS tablet Take 1 tablet (5 mg total) by mouth daily. 90 tablet 3   metFORMIN (GLUCOPHAGE-XR) 500 MG 24 hr tablet Take 1 tablet by mouth twice a day for Diabetes     pantoprazole (PROTONIX) 40 MG tablet TAKE 1 TABLET (40 MG TOTAL) BY MOUTH DAILY. 90 tablet 0   rosuvastatin (CRESTOR) 20 MG tablet Take one-half tablet by mouth once a day for Cholesterol     sacubitril-valsartan (  ENTRESTO) 97-103 MG Take 1 tablet by mouth 2 (two) times daily. Take 1 by mouth twice a day     spironolactone (ALDACTONE) 25 MG tablet Take 25 mg by mouth daily.      traMADol (ULTRAM) 50 MG tablet Take 1-2 tablets (50-100 mg total) by mouth every 6 (six) hours as needed. 20 tablet 1   No current facility-administered medications for this visit.    REVIEW OF SYSTEMS:  [X]  denotes positive finding, [ ]  denotes negative finding Cardiac  Comments:  Chest pain or chest pressure:    Shortness of breath upon exertion:    Short of breath when lying flat:    Irregular heart rhythm:        Vascular    Pain in calf, thigh, or hip brought on by ambulation: x   Pain in feet at night that wakes you up from your sleep:  x   Blood clot in your veins:    Leg swelling:  x       Pulmonary    Oxygen at home:    Productive cough:     Wheezing:         Neurologic    Sudden weakness in arms or legs:     Sudden numbness in arms or legs:     Sudden onset of difficulty speaking or slurred speech:    Temporary loss of vision in one eye:     Problems with dizziness:         Gastrointestinal    Blood in stool:     Vomited blood:         Genitourinary    Burning when urinating:     Blood in urine:        Psychiatric    Major depression:         Hematologic    Bleeding problems:    Problems with blood clotting too easily:        Skin    Rashes or ulcers:        Constitutional     Fever or chills:      PHYSICAL EXAM Vitals:   04/21/21 0942  BP: (!) 101/58  Pulse: (!) 58  Resp: 20  Temp: 97.9 F (36.6 C)  SpO2: 97%  Weight: 227 lb 8 oz (103.2 kg)  Height: 6' (1.829 m)    Constitutional: well appearing. no distress. Appears well nourished.  Neurologic: CN intact. no focal findings. no sensory loss. Psychiatric:  Mood and affect symmetric and appropriate. Eyes:  No icterus. No conjunctival pallor. Ears, nose, throat:  mucous membranes moist. Midline trachea.  Cardiac: regular rate and rhythm.  Respiratory:  unlabored. Abdominal:  soft, non-tender, non-distended.  Peripheral vascular: 2+ L femoral pulse  Absent R femoral pulse  No palpable pedal pulses  Previous R GSV harvest Extremity: No edema. No cyanosis. No pallor.  Skin: no gangrene. no ulceration.  Lymphatic: no Stemmer's sign. no palpable lymphadenopathy.  PERTINENT LABORATORY AND RADIOLOGIC DATA  Most recent CBC CBC Latest Ref Rng & Units 05/22/2018 11/06/2016 11/05/2016  WBC 4.0 - 10.5 K/uL 7.9 10.1 8.9  Hemoglobin 13.0 - 17.0 g/dL 15.1 13.3 13.7  Hematocrit 39.0 - 52.0 % 45.9 40.5 41.4  Platelets 150 - 400 K/uL 192 126(L) 130(L)     Most recent CMP CMP Latest Ref Rng & Units 03/31/2021 05/22/2018 12/16/2017  Glucose 70 - 99 mg/dL 111(H) 108(H) 134(H)  BUN 6 - 23 mg/dL 19 15 14   Creatinine 0.40 - 1.50  mg/dL 1.16 0.98 1.07  Sodium 135 - 145 mEq/L 138 136 136  Potassium 3.5 - 5.1 mEq/L 4.9 4.6 4.6  Chloride 96 - 112 mEq/L 102 106 102  CO2 19 - 32 mEq/L 27 23 25   Calcium 8.4 - 10.5 mg/dL 10.0 9.5 9.5  Total Protein 6.0 - 8.3 g/dL 7.8 - -  Total Bilirubin 0.2 - 1.2 mg/dL 0.7 - -  Alkaline Phos 39 - 117 U/L 48 - -  AST 0 - 37 U/L 19 - -  ALT 0 - 53 U/L 21 - -    Renal function Estimated Creatinine Clearance: 77.8 mL/min (by C-G formula based on SCr of 1.16 mg/dL).  Hemoglobin A1C (%)  Date Value  04/16/2021 7.7 (A)   Hgb A1c MFr Bld (%)  Date Value  03/31/2021 8.0 (H)    LDL  Cholesterol  Date Value Ref Range Status  12/16/2017 66 0 - 99 mg/dL Final   Direct LDL  Date Value Ref Range Status  02/08/2013 198.1 mg/dL Final    Comment:    Optimal:  <100 mg/dLNear or Above Optimal:  100-129 mg/dLBorderline High:  130-159 mg/dLHigh:  160-189 mg/dLVery High:  >190 mg/dL     Vascular Imaging:   +-------+-----------+-----------+------------+------------+  ABI/TBIToday's ABIToday's TBIPrevious ABIPrevious TBI  +-------+-----------+-----------+------------+------------+  Right  0.54       0.20                                 +-------+-----------+-----------+------------+------------+  Left   1.00       0.85                                 +-------+-----------+-----------+------------+------------+   LOWER EXTREMITY ARTERIAL DUPLEX STUDY   Patient Name:  [name]  Date of Exam:   03/06/2021  Medical Rec #: [id number]       Accession #:    [id number]  Date of Birth: 1955-10-31        Patient Gender: M  Patient Age:   45 years  Exam Location:  [facility]  Procedure:      VAS [place] LOWER EXTREMITY ARTERIAL DUPLEX  Referring Phys: Tyrone Apple PLOTNIKOV    ---------------------------------------------------------------------------  -----     Indications: Claudication, and peripheral artery disease.   High Risk Factors: Hypertension, hyperlipidemia, Diabetes, past history of                     smoking, coronary artery disease.     Current ABI: Right: 0.54, Left: 1.0   Performing Technologist: Delorise Shiner RVT      Examination Guidelines: A complete evaluation includes B-mode imaging,  spectral  Doppler, color Doppler, and power Doppler as needed of all accessible  portions  of each vessel. Bilateral testing is considered an integral part of a  complete  examination. Limited examinations for reoccurring indications may be  performed  as noted.        +----------+--------+-----+---------------+----------+--------+   RIGHT     PSV cm/sRatioStenosis       Waveform  Comments  +----------+--------+-----+---------------+----------+--------+  EIA Distal48                          monophasic          +----------+--------+-----+---------------+----------+--------+  CFA Prox  46  monophasic          +----------+--------+-----+---------------+----------+--------+  CFA Distal26                          monophasic          +----------+--------+-----+---------------+----------+--------+  DFA       14                          monophasic          +----------+--------+-----+---------------+----------+--------+  SFA Prox  18                          monophasic          +----------+--------+-----+---------------+----------+--------+  SFA Mid   160          50-74% stenosismonophasic          +----------+--------+-----+---------------+----------+--------+  SFA Distal0            occluded       absent              +----------+--------+-----+---------------+----------+--------+  POP Prox  15                          monophasic          +----------+--------+-----+---------------+----------+--------+  POP Distal15                          monophasic          +----------+--------+-----+---------------+----------+--------+  ATA Distal32                          monophasic          +----------+--------+-----+---------------+----------+--------+  PTA Distal17                          monophasic          +----------+--------+-----+---------------+----------+--------+   A focal velocity elevation of 160 cm/s was obtained at Mid femoral artery  with a VR of 9.4. Findings are characteristic of 50-74% stenosis.           Summary:  Right: Total occlusion noted in the superficial femoral artery. Findings  suggest proximal right iliac inflow occlusive disease.      See table(s) above for measurements and  observations.      Electronically signed by Deitra Mayo MD on 03/06/2021 at 6:30:36 PM.   Yevonne Aline. Stanford Breed, MD [facility] Phone Number: [phone] 04/21/2021 12:58 PM  Total time spent on preparing this encounter including chart review, data review, collecting history, examining the patient, coordinating care for this established patient, 40 minutes.  Portions of this report may have been transcribed using voice recognition software.  Every effort has been made to ensure accuracy; however, inadvertent computerized transcription errors may still be present.

## 2021-04-19 NOTE — H&P (View-Only) (Signed)
[facility]ISTS OF [place]  ASSESSMENT / PLAN: [name] is a 65 y.o. 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  Patient counseled patients with chronic limb threatening ischemia have an annual risk of cardiovascular mortality of 25% and a high risk of amputation.   Recommend the following which can slow the progression of atherosclerosis and reduce the risk of major adverse cardiac / limb events:  Complete cessation from all tobacco products. Blood glucose control with goal A1c < 7%. Blood pressure control with goal blood pressure < 140/90 mmHg. Lipid reduction therapy with goal LDL-C <100 mg/dL (<70 if symptomatic from PAD).  Aspirin 81mg  PO QD.  Atorvastatin 40-80mg  PO QD (or other "high intensity" statin therapy).  Plan CT angiogram of abdomen / pelvis with bilateral runoff to plan intervention. I will call the patient with the results and "next steps" to therapy.   CHIEF COMPLAINT: Right leg pain  HISTORY OF PRESENT ILLNESS: [name] is a 65 y.o. male who presents to clinic for evaluation of right lower extremity pain.  The patient reports a fairly classic history of short distance claudication (50 yards) for the past several months.  This is deteriorated.  He now has pain at rest, which resolves with dangling his foot over the edge of the bed.  Thankfully, he has no sores about his foot.  He has an extensive cardiovascular history (see below).  VASCULAR SURGICAL HISTORY: Right carotid endarterectomy 11/05/16 with Dr. Donnetta Hutching.  VASCULAR RISK FACTORS: Negative history of stroke / transient ischemic attack. Positive history of coronary artery disease. + history of PCI. + history of CABG.  Positive history of diabetes mellitus. Last A1c 7.7. Positive history of smoking. Not actively smoking. Positive history of hypertension.  Negative history of chronic kidney disease.  Last GFR 65.  Negative history of  chronic obstructive pulmonary disease.  FUNCTIONAL STATUS: ECOG performance status: (0) Fully active, able to carry on all predisease performance without restriction Ambulatory status: Ambulatory within the community with limits  Past Medical History:  Diagnosis Date   Aortic valve sclerosis    aortic valve sclerosis   Arthritis    CAD (coronary artery disease)    Myoview 5/18: 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 no ischemia EF 27%   Carotid artery disease (HCC)    chronic total occlusion LCCA, retrograde flow from LECA to LICA, ,, 0-09%FGHW, s/p Right CEA 2993   Diabetes mellitus without complication ([place])    Dizziness    Ejection fraction < 50%    EF 40%, echo 2006, no ICD needed  / EF 40%, echo, 11/2009   Former cigarette smoker    Hx of CABG july 2003   01/2002   Hyperlipidemia    Low HDL   Hypertension    Mitral regurgitation    mild, echo, 11/2009   Myocardial infarction [facility])    "they told me i had a silent heart attack"   Peripheral vascular disease ([place])    Dr. Erskin Burnet   Sleep disorder    Dr Gwenette Greet, lose weight and consider sleep study later   TIA (transient ischemic attack)    by history   Volume overload     Past Surgical History:  Procedure Laterality Date   CHOLECYSTECTOMY     COLONOSCOPY  03/19/2009   CORONARY ARTERY BYPASS GRAFT     ENDARTERECTOMY Right 11/05/2016   Procedure: ENDARTERECTOMY RIGHT CAROTID;  Surgeon: Donnetta Hutching,  Arvilla Meres, MD;  Location: [place];  Service: Vascular;  Laterality: Right;   PATCH ANGIOPLASTY Right 11/05/2016   Procedure: RIGHT CAROTID ARTERY PATCH ANGIOPLASTY USING HEMASHIELD PLATINUM FINESSE PATCH;  Surgeon: [name], MD;  Location: [place];  Service: Vascular;  Laterality: Right;   SHOULDER ARTHROSCOPY Right 05/25/2018   Procedure: RIGHT ARTHROSCOPY SHOULDER LABRAL DEBRIDEMENT, rotator cuff debridement,  BICEPS TENOTOMY and subacromial decompression;  Surgeon: Tania Ade, MD;  Location: [place];  Service:  Orthopedics;  Laterality: Right;    Family History  Problem Relation Age of Onset   Stroke Mother    Diabetes Father    COPD Father    Arthritis Father    Heart disease Father    Coronary artery disease Unknown        Male 1st degree relative <50   Diabetes Unknown        1st degree relative   Stroke Unknown        Male 1st degree relative<50    Social History   Socioeconomic History   Marital status: Married    Spouse name: Not on file   Number of children: Not on file   Years of education: Not on file   Highest education level: Not on file  Occupational History   Not on file  Tobacco Use   Smoking status: Former    Types: Cigarettes    Quit date: 05/05/2002    Years since quitting: 18.9   Smokeless tobacco: Never  Vaping Use   Vaping Use: Never used  Substance and Sexual Activity   Alcohol use: Yes    Alcohol/week: 0.0 standard drinks    Comment: beer every now and then   Drug use: No   Sexual activity: Yes  Other Topics Concern   Not on file  Social History Narrative   No Regular exercise   Social Determinants of Health   Financial Resource Strain: Not on file  Food Insecurity: Not on file  Transportation Needs: Not on file  Physical Activity: Not on file  Stress: Not on file  Social Connections: Not on file  Intimate Partner Violence: Not on file    No Known Allergies  Current Outpatient Medications  Medication Sig Dispense Refill   aspirin 81 MG chewable tablet Take 1 by mouth daily     carvedilol (COREG) 25 MG tablet TAKE 1 TABLET (25 MG TOTAL) BY MOUTH 2 (TWO) TIMES DAILY WITH A MEAL. (Patient taking differently: Take 25 mg by mouth 2 (two) times daily with a meal.) 180 tablet 3   Cholecalciferol (VITAMIN D3) 1000 UNITS tablet Take 1,000 Units by mouth daily.       empagliflozin (JARDIANCE) 25 MG TABS tablet Take 25 mg by mouth daily. 30 tablet 11   hydrALAZINE (APRESOLINE) 10 MG tablet Take 1 by mouth three times a day     isosorbide dinitrate  (ISORDIL) 10 MG tablet Take 1 tablet by mouth 3 (three) times daily. Take 1 by mouth three times a day     linagliptin (TRADJENTA) 5 MG TABS tablet Take 1 tablet (5 mg total) by mouth daily. 90 tablet 3   metFORMIN (GLUCOPHAGE-XR) 500 MG 24 hr tablet Take 1 tablet by mouth twice a day for Diabetes     pantoprazole (PROTONIX) 40 MG tablet TAKE 1 TABLET (40 MG TOTAL) BY MOUTH DAILY. 90 tablet 0   rosuvastatin (CRESTOR) 20 MG tablet Take one-half tablet by mouth once a day for Cholesterol     sacubitril-valsartan (  ENTRESTO) 97-103 MG Take 1 tablet by mouth 2 (two) times daily. Take 1 by mouth twice a day     spironolactone (ALDACTONE) 25 MG tablet Take 25 mg by mouth daily.      traMADol (ULTRAM) 50 MG tablet Take 1-2 tablets (50-100 mg total) by mouth every 6 (six) hours as needed. 20 tablet 1   No current facility-administered medications for this visit.    REVIEW OF SYSTEMS:  [X]  denotes positive finding, [ ]  denotes negative finding Cardiac  Comments:  Chest pain or chest pressure:    Shortness of breath upon exertion:    Short of breath when lying flat:    Irregular heart rhythm:        Vascular    Pain in calf, thigh, or hip brought on by ambulation: x   Pain in feet at night that wakes you up from your sleep:  x   Blood clot in your veins:    Leg swelling:  x       Pulmonary    Oxygen at home:    Productive cough:     Wheezing:         Neurologic    Sudden weakness in arms or legs:     Sudden numbness in arms or legs:     Sudden onset of difficulty speaking or slurred speech:    Temporary loss of vision in one eye:     Problems with dizziness:         Gastrointestinal    Blood in stool:     Vomited blood:         Genitourinary    Burning when urinating:     Blood in urine:        Psychiatric    Major depression:         Hematologic    Bleeding problems:    Problems with blood clotting too easily:        Skin    Rashes or ulcers:        Constitutional     Fever or chills:      PHYSICAL EXAM Vitals:   04/21/21 0942  BP: (!) 101/58  Pulse: (!) 58  Resp: 20  Temp: 97.9 F (36.6 C)  SpO2: 97%  Weight: 227 lb 8 oz (103.2 kg)  Height: 6' (1.829 m)    Constitutional: well appearing. no distress. Appears well nourished.  Neurologic: CN intact. no focal findings. no sensory loss. Psychiatric:  Mood and affect symmetric and appropriate. Eyes:  No icterus. No conjunctival pallor. Ears, nose, throat:  mucous membranes moist. Midline trachea.  Cardiac: regular rate and rhythm.  Respiratory:  unlabored. Abdominal:  soft, non-tender, non-distended.  Peripheral vascular: 2+ L femoral pulse  Absent R femoral pulse  No palpable pedal pulses  Previous R GSV harvest Extremity: No edema. No cyanosis. No pallor.  Skin: no gangrene. no ulceration.  Lymphatic: no Stemmer's sign. no palpable lymphadenopathy.  PERTINENT LABORATORY AND RADIOLOGIC DATA  Most recent CBC CBC Latest Ref Rng & Units 05/22/2018 11/06/2016 11/05/2016  WBC 4.0 - 10.5 K/uL 7.9 10.1 8.9  Hemoglobin 13.0 - 17.0 g/dL 15.1 13.3 13.7  Hematocrit 39.0 - 52.0 % 45.9 40.5 41.4  Platelets 150 - 400 K/uL 192 126(L) 130(L)     Most recent CMP CMP Latest Ref Rng & Units 03/31/2021 05/22/2018 12/16/2017  Glucose 70 - 99 mg/dL 111(H) 108(H) 134(H)  BUN 6 - 23 mg/dL 19 15 14   Creatinine 0.40 - 1.50  mg/dL 1.16 0.98 1.07  Sodium 135 - 145 mEq/L 138 136 136  Potassium 3.5 - 5.1 mEq/L 4.9 4.6 4.6  Chloride 96 - 112 mEq/L 102 106 102  CO2 19 - 32 mEq/L 27 23 25   Calcium 8.4 - 10.5 mg/dL 10.0 9.5 9.5  Total Protein 6.0 - 8.3 g/dL 7.8 - -  Total Bilirubin 0.2 - 1.2 mg/dL 0.7 - -  Alkaline Phos 39 - 117 U/L 48 - -  AST 0 - 37 U/L 19 - -  ALT 0 - 53 U/L 21 - -    Renal function Estimated Creatinine Clearance: 77.8 mL/min (by C-G formula based on SCr of 1.16 mg/dL).  Hemoglobin A1C (%)  Date Value  04/16/2021 7.7 (A)   Hgb A1c MFr Bld (%)  Date Value  03/31/2021 8.0 (H)    LDL  Cholesterol  Date Value Ref Range Status  12/16/2017 66 0 - 99 mg/dL Final   Direct LDL  Date Value Ref Range Status  02/08/2013 198.1 mg/dL Final    Comment:    Optimal:  <100 mg/dLNear or Above Optimal:  100-129 mg/dLBorderline High:  130-159 mg/dLHigh:  160-189 mg/dLVery High:  >190 mg/dL     Vascular Imaging:   +-------+-----------+-----------+------------+------------+  ABI/TBIToday's ABIToday's TBIPrevious ABIPrevious TBI  +-------+-----------+-----------+------------+------------+  Right  0.54       0.20                                 +-------+-----------+-----------+------------+------------+  Left   1.00       0.85                                 +-------+-----------+-----------+------------+------------+   LOWER EXTREMITY ARTERIAL DUPLEX STUDY   Patient Name:  [name]  Date of Exam:   03/06/2021  Medical Rec #: [id number]       Accession #:    [id number]  Date of Birth: [birth year]        Patient Gender: M  Patient Age:   42 years  Exam Location:  [facility]  Procedure:      VAS [place] LOWER EXTREMITY ARTERIAL DUPLEX  Referring Phys: Tyrone Apple PLOTNIKOV    ---------------------------------------------------------------------------  -----     Indications: Claudication, and peripheral artery disease.   High Risk Factors: Hypertension, hyperlipidemia, Diabetes, past history of                     smoking, coronary artery disease.     Current ABI: Right: 0.54, Left: 1.0   Performing Technologist: Delorise Shiner RVT      Examination Guidelines: A complete evaluation includes B-mode imaging,  spectral  Doppler, color Doppler, and power Doppler as needed of all accessible  portions  of each vessel. Bilateral testing is considered an integral part of a  complete  examination. Limited examinations for reoccurring indications may be  performed  as noted.        +----------+--------+-----+---------------+----------+--------+   RIGHT     PSV cm/sRatioStenosis       Waveform  Comments  +----------+--------+-----+---------------+----------+--------+  EIA Distal48                          monophasic          +----------+--------+-----+---------------+----------+--------+  CFA Prox  46  monophasic          +----------+--------+-----+---------------+----------+--------+  CFA Distal26                          monophasic          +----------+--------+-----+---------------+----------+--------+  DFA       14                          monophasic          +----------+--------+-----+---------------+----------+--------+  SFA Prox  18                          monophasic          +----------+--------+-----+---------------+----------+--------+  SFA Mid   160          50-74% stenosismonophasic          +----------+--------+-----+---------------+----------+--------+  SFA Distal0            occluded       absent              +----------+--------+-----+---------------+----------+--------+  POP Prox  15                          monophasic          +----------+--------+-----+---------------+----------+--------+  POP Distal15                          monophasic          +----------+--------+-----+---------------+----------+--------+  ATA Distal32                          monophasic          +----------+--------+-----+---------------+----------+--------+  PTA Distal17                          monophasic          +----------+--------+-----+---------------+----------+--------+   A focal velocity elevation of 160 cm/s was obtained at Mid femoral artery  with a VR of 9.4. Findings are characteristic of 50-74% stenosis.           Summary:  Right: Total occlusion noted in the superficial femoral artery. Findings  suggest proximal right iliac inflow occlusive disease.      See table(s) above for measurements and  observations.      Electronically signed by Deitra Mayo MD on 03/06/2021 at 6:30:36 PM.   Yevonne Aline. Stanford Breed, MD [facility] Special[facility] of [facility] Phone Number: [phone] 04/21/2021 12:58 PM  Total time spent on preparing this encounter including chart review, data review, collecting history, examining the patient, coordinating care for this established patient, 40 minutes.  Portions of this report may have been transcribed using voice recognition software.  Every effort has been made to ensure accuracy; however, inadvertent computerized transcription errors may still be present.

## 2021-04-21 ENCOUNTER — Encounter: Payer: Self-pay | Admitting: Vascular Surgery

## 2021-04-21 ENCOUNTER — Other Ambulatory Visit: Payer: Self-pay

## 2021-04-21 ENCOUNTER — Ambulatory Visit (INDEPENDENT_AMBULATORY_CARE_PROVIDER_SITE_OTHER): Payer: Medicare Other | Admitting: Vascular Surgery

## 2021-04-21 VITALS — BP 101/58 | HR 58 | Temp 97.9°F | Resp 20 | Ht 72.0 in | Wt 227.5 lb

## 2021-04-21 DIAGNOSIS — I7409 Other arterial embolism and thrombosis of abdominal aorta: Secondary | ICD-10-CM | POA: Diagnosis not present

## 2021-04-21 DIAGNOSIS — I739 Peripheral vascular disease, unspecified: Secondary | ICD-10-CM

## 2021-04-22 ENCOUNTER — Ambulatory Visit (HOSPITAL_COMMUNITY)
Admission: RE | Admit: 2021-04-22 | Discharge: 2021-04-22 | Disposition: A | Discharge status: Home / Self Care | Payer: Medicare Other | Source: Ambulatory Visit | Attending: Vascular Surgery | Admitting: Vascular Surgery

## 2021-04-22 DIAGNOSIS — I739 Peripheral vascular disease, unspecified: Secondary | ICD-10-CM | POA: Insufficient documentation

## 2021-04-22 MED ORDER — IOHEXOL 350 MG/ML SOLN
100.0000 mL | Freq: Once | INTRAVENOUS | Status: AC | PRN
  Administered 2021-04-22: 100 mL via INTRAVENOUS

## 2021-04-23 ENCOUNTER — Ambulatory Visit (INDEPENDENT_AMBULATORY_CARE_PROVIDER_SITE_OTHER): Payer: Medicare Other | Admitting: Vascular Surgery

## 2021-04-23 ENCOUNTER — Other Ambulatory Visit: Payer: Self-pay

## 2021-04-23 DIAGNOSIS — I7409 Other arterial embolism and thrombosis of abdominal aorta: Secondary | ICD-10-CM

## 2021-04-23 DIAGNOSIS — I739 Peripheral vascular disease, unspecified: Secondary | ICD-10-CM

## 2021-04-23 NOTE — Progress Notes (Signed)
Discussed CT angiogram findings with patient. Plan for right femoral endarterectomy and right common iliac stenting in hybrid OR next week. Patient amenable.  [name]. Stanford Breed, MD [facility] and Vein Specialists of [facility] Phone Number: [phone] 04/23/2021 3:22 PM

## 2021-04-27 NOTE — Pre-Procedure Instructions (Addendum)
Surgical Instructions    Your procedure is scheduled on Wednesday 04/29/21.   Report to [facility] Main Entrance "A" at 05:30 A.M., then check in with the Admitting office.  Call this number if you have problems the morning of surgery:  [phone]   If you have any questions prior to your surgery date call [phone]: Open Monday-Friday 8am-4pm    Remember:  Do not eat or drink after midnight the night before your surgery     Take these medicines the morning of surgery with A SIP OF WATER   carvedilol (COREG)  hydrALAZINE (APRESOLINE)  isosorbide dinitrate (ISORDIL)  rosuvastatin (CRESTOR)  traMADol (ULTRAM)- If needed  Please follow your surgeon's instructions regarding Aspirin. If you have not received instructions then please contact your surgeon's office for instructions.   As of today, STOP taking any Aspirin (unless otherwise instructed by your surgeon) Aleve, Naproxen, Ibuprofen, Motrin, Advil, Goody's, BC's, all herbal medications, fish oil, and all vitamins.          WHAT DO I DO ABOUT MY DIABETES MEDICATION?   Do not take oral diabetes medicines (pills) the morning of surgery.  DO NOT TAKE empagliflozin (JARDIANCE) the day before surgery (04/28/21) and the morning of surgery (04/29/21).  DO NOT TAKE linagliptin (TRADJENTA) the morning of surgery (04/29/21)             DO NOT TAKE metFORMIN (GLUCOPHAGE-XR) the morning of surgery (04/29/21)  The day of surgery, do not take other diabetes injectables, including Byetta (exenatide), Bydureon (exenatide ER), Victoza (liraglutide), or Trulicity (dulaglutide).   HOW TO MANAGE YOUR DIABETES BEFORE AND AFTER SURGERY  Why is it important to control my blood sugar before and after surgery? Improving blood sugar levels before and after surgery helps healing and can limit problems. A way of improving blood sugar control is eating a healthy diet by:  Eating less sugar and carbohydrates  Increasing activity/exercise   Talking with your doctor about reaching your blood sugar goals High blood sugars (greater than 180 mg/dL) can raise your risk of infections and slow your recovery, so you will need to focus on controlling your diabetes during the weeks before surgery. Make sure that the doctor who takes care of your diabetes knows about your planned surgery including the date and location.  How do I manage my blood sugar before surgery? Check your blood sugar at least 4 times a day, starting 2 days before surgery, to make sure that the level is not too high or low.  Check your blood sugar the morning of your surgery when you wake up and every 2 hours until you get to the Short Stay unit.  If your blood sugar is less than 70 mg/dL, you will need to treat for low blood sugar: Do not take insulin. Treat a low blood sugar (less than 70 mg/dL) with  cup of clear juice (cranberry or apple), 4 glucose tablets, OR glucose gel. Recheck blood sugar in 15 minutes after treatment (to make sure it is greater than 70 mg/dL). If your blood sugar is not greater than 70 mg/dL on recheck, call [phone] for further instructions. Report your blood sugar to the short stay nurse when you get to Short Stay.  If you are admitted to the hospital after surgery: Your blood sugar will be checked by the staff and you will probably be given insulin after surgery (instead of oral diabetes medicines) to make sure you have good blood sugar levels. The goal for blood sugar  control after surgery is 80-180 mg/dL.             Do NOT Smoke (Tobacco/Vaping) or drink Alcohol 24 hours prior to your procedure.  If you use a CPAP at night, you may bring all equipment for your overnight stay.   Contacts, glasses, piercing's, hearing aid's, dentures or partials may not be worn into surgery, please bring cases for these belongings.    For patients admitted to the hospital, discharge time will be determined by your treatment team.   Patients  discharged the day of surgery will not be allowed to drive home, and someone needs to stay with them for 24 hours.  NO VISITORS WILL BE ALLOWED IN PRE-OP WHERE PATIENTS GET READY FOR SURGERY.  ONLY 1 SUPPORT PERSON MAY BE PRESENT IN THE WAITING ROOM WHILE YOU ARE IN SURGERY.  IF YOU ARE TO BE ADMITTED, ONCE YOU ARE IN YOUR ROOM YOU WILL BE ALLOWED TWO (2) VISITORS.  Minor children may have two parents present. Special consideration for safety and communication needs will be reviewed on a case by case basis.   Special instructions:   [place]- Preparing For Surgery  Before surgery, you can play an important role. Because skin is not sterile, your skin needs to be as free of germs as possible. You can reduce the number of germs on your skin by washing with CHG (chlorahexidine gluconate) Soap before surgery.  CHG is an antiseptic cleaner which kills germs and bonds with the skin to continue killing germs even after washing.    Oral Hygiene is also important to reduce your risk of infection.  Remember - BRUSH YOUR TEETH THE MORNING OF SURGERY WITH YOUR REGULAR TOOTHPASTE  Please do not use if you have an allergy to CHG or antibacterial soaps. If your skin becomes reddened/irritated stop using the CHG.  Do not shave (including legs and underarms) for at least 48 hours prior to first CHG shower. It is OK to shave your face.  Please follow these instructions carefully.   Shower the NIGHT BEFORE SURGERY and the MORNING OF SURGERY  If you chose to wash your hair, wash your hair first as usual with your normal shampoo.  After you shampoo, rinse your hair and body thoroughly to remove the shampoo.  Use CHG Soap as you would any other liquid soap. You can apply CHG directly to the skin and wash gently with a scrungie or a clean washcloth.   Apply the CHG Soap to your body ONLY FROM THE NECK DOWN.  Do not use on open wounds or open sores. Avoid contact with your eyes, ears, mouth and genitals (private  parts). Wash Face and genitals (private parts)  with your normal soap.   Wash thoroughly, paying special attention to the area where your surgery will be performed.  Thoroughly rinse your body with warm water from the neck down.  DO NOT shower/wash with your normal soap after using and rinsing off the CHG Soap.  Pat yourself dry with a CLEAN TOWEL.  Wear CLEAN PAJAMAS to bed the night before surgery  Place CLEAN SHEETS on your bed the night before your surgery  DO NOT SLEEP WITH PETS.   Day of Surgery: Shower with CHG soap. Do not wear jewelry, make up, nail polish, gel polish, artificial nails, or any other type of covering on natural nails including finger and toenails. If patients have artificial nails, gel coating, etc. that need to be removed by a nail salon please  have this removed prior to surgery. Surgery may need to be canceled/delayed if the surgeon/ anesthesia feels like the patient is unable to be adequately monitored. Do not wear lotions, powders, perfumes/colognes, or deodorant. Do not shave 48 hours prior to surgery.  Men may shave face and neck. Do not bring valuables to the hospital. [facility] is not responsible for any belongings or valuables. Wear Clean/Comfortable clothing the morning of surgery Remember to brush your teeth WITH YOUR REGULAR TOOTHPASTE.   Please read over the following fact sheets that you were given.   3 days prior to your procedure or After your COVID test   You are not required to quarantine however you are required to wear a well-fitting mask when you are out and around people not in your household. If your mask becomes wet or soiled, replace with a new one.   Wash your hands often with soap and water for 20 seconds or clean your hands with an alcohol-based hand sanitizer that contains at least 60% alcohol.   Do not share personal items.   Notify your provider:  o if you are in close contact with someone who has COVID  o or if  you develop a fever of 100.4 or greater, sneezing, cough, sore throat, shortness of breath or body aches.

## 2021-04-28 ENCOUNTER — Encounter (HOSPITAL_COMMUNITY)
Admission: RE | Admit: 2021-04-28 | Discharge: 2021-04-28 | Disposition: A | Discharge status: Home / Self Care | Payer: Medicare Other | Source: Ambulatory Visit | Attending: Vascular Surgery | Admitting: Vascular Surgery

## 2021-04-28 ENCOUNTER — Other Ambulatory Visit: Payer: Self-pay

## 2021-04-28 ENCOUNTER — Telehealth: Payer: Self-pay

## 2021-04-28 ENCOUNTER — Encounter (HOSPITAL_COMMUNITY): Payer: Self-pay

## 2021-04-28 VITALS — BP 133/67 | HR 72 | Temp 97.8°F | Resp 19 | Ht 72.0 in | Wt 227.4 lb

## 2021-04-28 DIAGNOSIS — Z951 Presence of aortocoronary bypass graft: Secondary | ICD-10-CM | POA: Insufficient documentation

## 2021-04-28 DIAGNOSIS — Z20822 Contact with and (suspected) exposure to covid-19: Secondary | ICD-10-CM | POA: Insufficient documentation

## 2021-04-28 DIAGNOSIS — Z01818 Encounter for other preprocedural examination: Secondary | ICD-10-CM | POA: Insufficient documentation

## 2021-04-28 DIAGNOSIS — E118 Type 2 diabetes mellitus with unspecified complications: Secondary | ICD-10-CM | POA: Insufficient documentation

## 2021-04-28 DIAGNOSIS — I739 Peripheral vascular disease, unspecified: Secondary | ICD-10-CM | POA: Insufficient documentation

## 2021-04-28 DIAGNOSIS — I7409 Other arterial embolism and thrombosis of abdominal aorta: Secondary | ICD-10-CM | POA: Insufficient documentation

## 2021-04-28 LAB — COMPREHENSIVE METABOLIC PANEL
ALT: 30 U/L (ref 0–44)
AST: 26 U/L (ref 15–41)
Albumin: 4 g/dL (ref 3.5–5.0)
Alkaline Phosphatase: 39 U/L (ref 38–126)
Anion gap: 12 (ref 5–15)
BUN: 13 mg/dL (ref 8–23)
CO2: 18 mmol/L — ABNORMAL LOW (ref 22–32)
Calcium: 9.2 mg/dL (ref 8.9–10.3)
Chloride: 103 mmol/L (ref 98–111)
Creatinine, Ser: 1.14 mg/dL (ref 0.61–1.24)
GFR, Estimated: >60 mL/min (ref >60)
Glucose, Bld: 178 mg/dL — ABNORMAL HIGH (ref 70–99)
Potassium: 4.1 mmol/L (ref 3.5–5.1)
Sodium: 133 mmol/L — ABNORMAL LOW (ref 135–145)
Total Bilirubin: 1 mg/dL (ref 0.3–1.2)
Total Protein: 7 g/dL (ref 6.5–8.1)

## 2021-04-28 LAB — BLOOD GAS, ARTERIAL
Acid-base deficit: 3.6 mmol/L — ABNORMAL HIGH (ref 0.0–2.0)
Bicarbonate: 20.7 mmol/L (ref 20.0–28.0)
Drawn by: 602861
FIO2: 21
O2 Saturation: 97.7 %
Patient temperature: 37
pCO2 arterial: 36.5 mmHg (ref 32.0–48.0)
pH, Arterial: 7.372 (ref 7.350–7.450)
pO2, Arterial: 105 mmHg (ref 83.0–108.0)

## 2021-04-28 LAB — CBC
HCT: 45.1 % (ref 39.0–52.0)
Hemoglobin: 15.2 g/dL (ref 13.0–17.0)
MCH: 32.6 pg (ref 26.0–34.0)
MCHC: 33.7 g/dL (ref 30.0–36.0)
MCV: 96.8 fL (ref 80.0–100.0)
Platelets: 188 10*3/uL (ref 150–400)
RBC: 4.66 MIL/uL (ref 4.22–5.81)
RDW: 12.7 % (ref 11.5–15.5)
WBC: 5.2 10*3/uL (ref 4.0–10.5)
nRBC: 0 % (ref 0.0–0.2)

## 2021-04-28 LAB — URINALYSIS, ROUTINE W REFLEX MICROSCOPIC
Bacteria, UA: NONE SEEN
Bilirubin Urine: NEGATIVE
Glucose, UA: >=500 mg/dL — AB
Hgb urine dipstick: NEGATIVE
Ketones, ur: NEGATIVE mg/dL
Leukocytes,Ua: NEGATIVE
Nitrite: NEGATIVE
Protein, ur: NEGATIVE mg/dL
Specific Gravity, Urine: 1.025 (ref 1.005–1.030)
pH: 5 (ref 5.0–8.0)

## 2021-04-28 LAB — PROTIME-INR
INR: 1.1 (ref 0.8–1.2)
Prothrombin Time: 14 seconds (ref 11.4–15.2)

## 2021-04-28 LAB — SURGICAL PCR SCREEN
MRSA, PCR: NEGATIVE
Staphylococcus aureus: NEGATIVE

## 2021-04-28 LAB — APTT: aPTT: 36 seconds (ref 24–36)

## 2021-04-28 LAB — SARS CORONAVIRUS 2 BY RT PCR (HOSPITAL ORDER, PERFORMED IN ~~LOC~~ HOSPITAL LAB): SARS Coronavirus 2: NEGATIVE

## 2021-04-28 LAB — GLUCOSE, CAPILLARY: Glucose-Capillary: 183 mg/dL — ABNORMAL HIGH (ref 70–99)

## 2021-04-28 LAB — TYPE AND SCREEN
ABO/RH(D): A POS
Antibody Screen: NEGATIVE

## 2021-04-28 NOTE — Progress Notes (Signed)
COVID test result is showing negative in Epic after a call was received from the lab with a positive result. Called [facility] and spoke to [employer] who stated she ran a 4 plex test that was positive and then repeated with a single test that was negative. Per Tomadur, she has to repeat the test one more time to get a final result. Per Tomadur this will take approximately 1-1/2 hours. Solon Palm with Dr. Mora Appl office made aware. [facility]ur will call this nurse with the final result once resulted.

## 2021-04-28 NOTE — Progress Notes (Signed)
Anesthesia Chart Review:  Hx of CAD s/p remote CABG x 4 2003, HFrEF (with recent improvement in EF to 40-45%), HTN, PVD. Now followed by cardiology at the [facility]. Last seen 02/13/21 and doing well. Per note, no complaint of chest pain or shortness of breath with activity.  His EF was previously 30 to 35% for several years, however recent echo 08/09/2019 showed improvement to 40 to 45% on GDMT.  He was advised to continue current medications and follow-up in 6 months.  History of DM2, last A1c 7.7 on 04/16/2021.  Preop labs reviewed, unremarkable.  EKG 04/28/21: Sinus bradycardia.  Rate 58.  Possible Inferior infarct , age undetermined. Possible Anterior infarct , age undetermined. T wave inversions noted in lead I and aVL, which have been noted on prior EKGs as well.  Transthoracic echocardiogram 08/09/2019 (Care Everywhere): Limited Echo. Lumason ultrasound contrast administered  Moderately dilated left ventricle Mildly reduced left ventricular function, EF 40-45% with distal inferor, posterior akinesis Grade II diastolic dysfunction Right ventricle is not well visualized on this exam No pericardial effusion In comparison with the images of 05/29/2019 the regional wall motion is unchanged but the overall systolic function has improved  Transthoracic echocardiogram 05/29/2019 (Care Everywhere): Moderately dilated left ventricle Moderately reduced left ventricular function, EF 30-35% with distal inferior, posterior and septal akinesis and myocardial Lumason uptake consistent with prior infarct Grade I diastolic dysfunction Normal right ventricular size and function Mild aortic sclerosis without stenosis No significant valvular regurgitation Normal caliber IVC suggests normal right atrial pressure No pericardial effusion No prior study for comparison  Nuclear stress 11/03/2016: Nuclear stress EF: 27%. Blood pressure demonstrated a normal response to exercise. There was no ST segment  deviation noted during stress. Findings consistent with prior myocardial infarction. This is a high risk study. The left ventricular ejection fraction is severely decreased (<30%).   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

## 2021-04-28 NOTE — Telephone Encounter (Signed)
error 

## 2021-04-28 NOTE — Anesthesia Preprocedure Evaluation (Addendum)
Anesthesia Evaluation  Patient identified by MRN, date of birth, ID band Patient awake    Reviewed: Allergy & Precautions, H&P , NPO status , Patient's Chart, lab work & pertinent test results  Airway Mallampati: II   Neck ROM: full    Dental   Pulmonary former smoker,    breath sounds clear to auscultation       Cardiovascular hypertension, + CAD, + Past MI, + CABG, + Peripheral Vascular Disease and +CHF   Rhythm:regular Rate:Normal     Neuro/Psych TIA   GI/Hepatic   Endo/Other  diabetes, Type 2  Renal/GU      Musculoskeletal  (+) Arthritis ,   Abdominal   Peds  Hematology   Anesthesia Other Findings   Reproductive/Obstetrics                            Anesthesia Physical Anesthesia Plan  ASA: 3  Anesthesia Plan: General   Post-op Pain Management:    Induction: Intravenous  PONV Risk Score and Plan: 2 and Ondansetron, Dexamethasone, Midazolam and Treatment may vary due to age or medical condition  Airway Management Planned: Oral ETT  Additional Equipment:   Intra-op Plan:   Post-operative Plan: Extubation in OR  Informed Consent: I have reviewed the patients History and Physical, chart, labs and discussed the procedure including the risks, benefits and alternatives for the proposed anesthesia with the patient or authorized representative who has indicated his/her understanding and acceptance.     Dental advisory given  Plan Discussed with: CRNA, Anesthesiologist and Surgeon  Anesthesia Plan Comments: (PAT note by Karoline Caldwell, PA-C: Hx of CAD s/p remote CABG x 4 2003, HFrEF (with recent improvement in EF to 40-45%), HTN, PVD. Now followed by cardiology at the [facility]. Last seen 02/13/21 and doing well. Per note, no complaint of chest pain or shortness of breath with activity.  His EF was previously 30 to 35% for several years, however recent echo 08/09/2019 showed improvement to 40  to 45% on GDMT.  He was advised to continue current medications and follow-up in 6 months.  History of DM2, last A1c 7.7 on 04/16/2021.  Preop labs reviewed, unremarkable.  EKG 04/28/21: Sinus bradycardia.  Rate 58.  Possible Inferior infarct , age undetermined. Possible Anterior infarct , age undetermined. T wave inversions noted in lead I and aVL, which have been noted on prior EKGs as well.  Transthoracic echocardiogram 08/09/2019 (Care Everywhere): Limited Echo. Lumason ultrasound contrast administered  Moderately dilated left ventricle Mildly reduced left ventricular function, EF 40-45% with distal inferor, posterior akinesis Grade II diastolic dysfunction Right ventricle is not well visualized on this exam No pericardial effusion In comparison with the images of 05/29/2019 the regional wall motion is unchanged but the overall systolic function has improved  Transthoracic echocardiogram 05/29/2019 (Care Everywhere): Moderately dilated left ventricle Moderately reduced left ventricular function, EF 30-35% with distal inferior, posterior and septal akinesis and myocardial Lumason uptake consistent with prior infarct Grade I diastolic dysfunction Normal right ventricular size and function Mild aortic sclerosis without stenosis No significant valvular regurgitation Normal caliber IVC suggests normal right atrial pressure No pericardial effusion No prior study for comparison  Nuclear stress 11/03/2016: . Nuclear stress EF: 27%. . Blood pressure demonstrated a normal response to exercise. . There was no ST segment deviation noted during stress. . Findings consistent with prior myocardial infarction. . This is a high risk study. . The left ventricular ejection fraction is severely  decreased (<30%).  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 no ischemia EF 27% )       Anesthesia Quick Evaluation

## 2021-04-28 NOTE — Progress Notes (Addendum)
Received at call from [place] in the lab stating patient's COVID test is positive. Contacted N. Breedlove at Dr. Mora Appl office and she will make Dr. Stanford Breed aware. Dr. Mora Appl office will contact the patient to make him aware and contact the OR desk with any changes.

## 2021-04-28 NOTE — Progress Notes (Signed)
PCP - Dr. Alain Marion Cardiologist - Dr. Edd Arbour at the [facility] Orders - n/a Rep Notified - n/a  Chest x-ray -  EKG - 04/28/21 Stress Test - 2018 ECHO - 2015 Cardiac Cath - 2003  Sleep Study - denies CPAP - n/a  Fasting Blood Sugar - 183 Checks Blood Sugar once a week Range- 110-120 per patient  Blood Thinner Instructions: n/a Aspirin Instructions: As of today stop taking Aspirin unless otherwise instructed by your surgeon. Patient states he did not receive any instructions regarding Aspirin and has continued to take it.   ERAS Protcol - No PRE-SURGERY Ensure or G2- n/a  COVID TEST- 04/28/21. Pending.    Anesthesia review: Yes. Spoke with Lorretta Harp, PA since patient's surgery is tomorrow morning. Patient's is seen by Dr. Edd Arbour at the [facility] for cardiology.   Patient denies shortness of breath, fever, cough and chest pain at PAT appointment. Patient states that he is able to climb 2 flights of stairs without any cardiopulmonary issues and that at his last visit with Dr. Edd Arbour approximately 2 months ago there were no issues. Patient states the only issue is pain and weakness in his right leg with activity.    All instructions explained to the patient, with a verbal understanding of the material. Patient agrees to go over the instructions while at home for a better understanding. Patient also instructed to self quarantine after being tested for COVID-19. The opportunity to ask questions was provided.

## 2021-04-28 NOTE — Progress Notes (Signed)
pt voiced understanding of arrival time of 0530

## 2021-04-28 NOTE — Progress Notes (Signed)
Received a call from [place] in the lab stating patient's 3rd COVID test that was run is negative and the final result is negative. Solon Palm at Dr. Mora Appl office made aware. Charolett Bumpers, RN- Surgical Short Stay made aware.

## 2021-04-29 ENCOUNTER — Encounter (HOSPITAL_COMMUNITY)
Admission: RE | Disposition: A | Discharge status: Home / Self Care | Payer: Self-pay | Source: Home / Self Care | Attending: Vascular Surgery

## 2021-04-29 ENCOUNTER — Inpatient Hospital Stay (HOSPITAL_COMMUNITY)
Admission: RE | Admit: 2021-04-29 | Discharge: 2021-04-30 | DRG: 271 | Disposition: A | Discharge status: Home / Self Care | Payer: Medicare Other | Attending: Vascular Surgery | Admitting: Vascular Surgery

## 2021-04-29 ENCOUNTER — Inpatient Hospital Stay (HOSPITAL_COMMUNITY): Payer: Medicare Other

## 2021-04-29 ENCOUNTER — Inpatient Hospital Stay (HOSPITAL_COMMUNITY): Payer: Medicare Other | Admitting: Physician Assistant

## 2021-04-29 ENCOUNTER — Inpatient Hospital Stay (HOSPITAL_COMMUNITY): Payer: Medicare Other | Admitting: Certified Registered"

## 2021-04-29 ENCOUNTER — Encounter (HOSPITAL_COMMUNITY): Payer: Self-pay | Admitting: Vascular Surgery

## 2021-04-29 DIAGNOSIS — I7409 Other arterial embolism and thrombosis of abdominal aorta: Secondary | ICD-10-CM | POA: Diagnosis not present

## 2021-04-29 DIAGNOSIS — Z8673 Personal history of transient ischemic attack (TIA), and cerebral infarction without residual deficits: Secondary | ICD-10-CM | POA: Diagnosis not present

## 2021-04-29 DIAGNOSIS — E1151 Type 2 diabetes mellitus with diabetic peripheral angiopathy without gangrene: Principal | ICD-10-CM | POA: Diagnosis present

## 2021-04-29 DIAGNOSIS — Z8249 Family history of ischemic heart disease and other diseases of the circulatory system: Secondary | ICD-10-CM | POA: Diagnosis not present

## 2021-04-29 DIAGNOSIS — Z8261 Family history of arthritis: Secondary | ICD-10-CM | POA: Diagnosis not present

## 2021-04-29 DIAGNOSIS — Z79899 Other long term (current) drug therapy: Secondary | ICD-10-CM | POA: Diagnosis not present

## 2021-04-29 DIAGNOSIS — I2510 Atherosclerotic heart disease of native coronary artery without angina pectoris: Secondary | ICD-10-CM | POA: Diagnosis not present

## 2021-04-29 DIAGNOSIS — Z7984 Long term (current) use of oral hypoglycemic drugs: Secondary | ICD-10-CM | POA: Diagnosis not present

## 2021-04-29 DIAGNOSIS — I10 Essential (primary) hypertension: Secondary | ICD-10-CM | POA: Diagnosis not present

## 2021-04-29 DIAGNOSIS — I252 Old myocardial infarction: Secondary | ICD-10-CM | POA: Diagnosis not present

## 2021-04-29 DIAGNOSIS — I358 Other nonrheumatic aortic valve disorders: Secondary | ICD-10-CM | POA: Diagnosis not present

## 2021-04-29 DIAGNOSIS — Z20822 Contact with and (suspected) exposure to covid-19: Secondary | ICD-10-CM | POA: Diagnosis not present

## 2021-04-29 DIAGNOSIS — Z9861 Coronary angioplasty status: Secondary | ICD-10-CM

## 2021-04-29 DIAGNOSIS — Z833 Family history of diabetes mellitus: Secondary | ICD-10-CM | POA: Diagnosis not present

## 2021-04-29 DIAGNOSIS — Z825 Family history of asthma and other chronic lower respiratory diseases: Secondary | ICD-10-CM | POA: Diagnosis not present

## 2021-04-29 DIAGNOSIS — G479 Sleep disorder, unspecified: Secondary | ICD-10-CM | POA: Diagnosis present

## 2021-04-29 DIAGNOSIS — I739 Peripheral vascular disease, unspecified: Secondary | ICD-10-CM | POA: Diagnosis present

## 2021-04-29 DIAGNOSIS — I745 Embolism and thrombosis of iliac artery: Secondary | ICD-10-CM | POA: Diagnosis present

## 2021-04-29 DIAGNOSIS — E785 Hyperlipidemia, unspecified: Secondary | ICD-10-CM | POA: Diagnosis present

## 2021-04-29 DIAGNOSIS — M1990 Unspecified osteoarthritis, unspecified site: Secondary | ICD-10-CM | POA: Diagnosis present

## 2021-04-29 DIAGNOSIS — Z7982 Long term (current) use of aspirin: Secondary | ICD-10-CM

## 2021-04-29 DIAGNOSIS — I70202 Unspecified atherosclerosis of native arteries of extremities, left leg: Secondary | ICD-10-CM | POA: Diagnosis present

## 2021-04-29 DIAGNOSIS — Z823 Family history of stroke: Secondary | ICD-10-CM | POA: Diagnosis not present

## 2021-04-29 DIAGNOSIS — I70221 Atherosclerosis of native arteries of extremities with rest pain, right leg: Secondary | ICD-10-CM | POA: Diagnosis present

## 2021-04-29 DIAGNOSIS — Z951 Presence of aortocoronary bypass graft: Secondary | ICD-10-CM

## 2021-04-29 DIAGNOSIS — Z87891 Personal history of nicotine dependence: Secondary | ICD-10-CM | POA: Diagnosis not present

## 2021-04-29 DIAGNOSIS — I70222 Atherosclerosis of native arteries of extremities with rest pain, left leg: Secondary | ICD-10-CM | POA: Diagnosis not present

## 2021-04-29 HISTORY — PX: PATCH ANGIOPLASTY: SHX6230

## 2021-04-29 HISTORY — PX: INSERTION OF ILIAC STENT: SHX6256

## 2021-04-29 HISTORY — PX: ULTRASOUND GUIDANCE FOR VASCULAR ACCESS: SHX6516

## 2021-04-29 HISTORY — PX: ENDARTERECTOMY FEMORAL: SHX5804

## 2021-04-29 LAB — GLUCOSE, CAPILLARY
Glucose-Capillary: 152 mg/dL — ABNORMAL HIGH (ref 70–99)
Glucose-Capillary: 202 mg/dL — ABNORMAL HIGH (ref 70–99)
Glucose-Capillary: 231 mg/dL — ABNORMAL HIGH (ref 70–99)

## 2021-04-29 LAB — POCT ACTIVATED CLOTTING TIME
Activated Clotting Time: 242 seconds
Activated Clotting Time: 237 seconds
Activated Clotting Time: 260 seconds

## 2021-04-29 SURGERY — ENDARTERECTOMY, FEMORAL
Anesthesia: General | Site: Groin | Laterality: Right

## 2021-04-29 MED ORDER — HYDRALAZINE HCL 10 MG PO TABS
10.0000 mg | ORAL_TABLET | Freq: Three times a day (TID) | ORAL | Status: DC
  Administered 2021-04-29 (×2): 10 mg via ORAL
  Filled 2021-04-29 (×2): qty 1

## 2021-04-29 MED ORDER — PHENOL 1.4 % MT LIQD: 1.0000 | OROMUCOSAL | Status: DC | PRN

## 2021-04-29 MED ORDER — ALUM & MAG HYDROXIDE-SIMETH 200-200-20 MG/5ML PO SUSP: 15.0000 mL | ORAL | Status: DC | PRN

## 2021-04-29 MED ORDER — ORAL CARE MOUTH RINSE: 15.0000 mL | Freq: Once | OROMUCOSAL | Status: AC

## 2021-04-29 MED ORDER — EPHEDRINE SULFATE-NACL 50-0.9 MG/10ML-% IV SOSY
PREFILLED_SYRINGE | INTRAVENOUS | Status: DC | PRN
  Administered 2021-04-29: 5 mg via INTRAVENOUS
  Administered 2021-04-29 (×2): 10 mg via INTRAVENOUS
  Administered 2021-04-29 (×3): 5 mg via INTRAVENOUS

## 2021-04-29 MED ORDER — DOCUSATE SODIUM 100 MG PO CAPS
100.0000 mg | ORAL_CAPSULE | Freq: Every day | ORAL | Status: DC
  Administered 2021-04-30: 100 mg via ORAL
  Filled 2021-04-29: qty 1

## 2021-04-29 MED ORDER — OXYCODONE-ACETAMINOPHEN 5-325 MG PO TABS
1.0000 | ORAL_TABLET | ORAL | Status: DC | PRN
Start: 2021-04-29 — End: 2021-04-30
  Administered 2021-04-29: 2 via ORAL
  Filled 2021-04-29: qty 2

## 2021-04-29 MED ORDER — ONDANSETRON HCL 4 MG/2ML IJ SOLN
INTRAMUSCULAR | Status: AC
  Filled 2021-04-29: qty 2

## 2021-04-29 MED ORDER — HEPARIN SODIUM (PORCINE) 1000 UNIT/ML IJ SOLN
INTRAMUSCULAR | Status: DC | PRN
  Administered 2021-04-29: 10000 [IU] via INTRAVENOUS
  Administered 2021-04-29 (×2): 2000 [IU] via INTRAVENOUS

## 2021-04-29 MED ORDER — IODIXANOL 320 MG/ML IV SOLN
INTRAVENOUS | Status: DC | PRN
  Administered 2021-04-29: 152 mL via INTRA_ARTERIAL

## 2021-04-29 MED ORDER — SODIUM CHLORIDE 0.9 % IV SOLN: INTRAVENOUS | Status: DC

## 2021-04-29 MED ORDER — MORPHINE SULFATE (PF) 2 MG/ML IV SOLN: 2.0000 mg | INTRAVENOUS | Status: DC | PRN

## 2021-04-29 MED ORDER — SUGAMMADEX SODIUM 200 MG/2ML IV SOLN
INTRAVENOUS | Status: DC | PRN
  Administered 2021-04-29: 200 mg via INTRAVENOUS

## 2021-04-29 MED ORDER — ACETAMINOPHEN 325 MG PO TABS: 325.0000 mg | ORAL_TABLET | ORAL | Status: DC | PRN

## 2021-04-29 MED ORDER — HYDRALAZINE HCL 20 MG/ML IJ SOLN: 5.0000 mg | INTRAMUSCULAR | Status: DC | PRN

## 2021-04-29 MED ORDER — PROPOFOL 10 MG/ML IV BOLUS
INTRAVENOUS | Status: DC | PRN
  Administered 2021-04-29: 120 mg via INTRAVENOUS

## 2021-04-29 MED ORDER — LACTATED RINGERS IV SOLN: INTRAVENOUS | Status: DC

## 2021-04-29 MED ORDER — CARVEDILOL 25 MG PO TABS
25.0000 mg | ORAL_TABLET | Freq: Two times a day (BID) | ORAL | Status: DC
  Administered 2021-04-29 – 2021-04-30 (×2): 25 mg via ORAL
  Filled 2021-04-29 (×2): qty 1

## 2021-04-29 MED ORDER — PHENYLEPHRINE HCL-NACL 20-0.9 MG/250ML-% IV SOLN
INTRAVENOUS | Status: DC | PRN
  Administered 2021-04-29: 50 ug/min via INTRAVENOUS

## 2021-04-29 MED ORDER — 0.9 % SODIUM CHLORIDE (POUR BTL) OPTIME
TOPICAL | Status: DC | PRN
  Administered 2021-04-29: 2000 mL

## 2021-04-29 MED ORDER — CEFAZOLIN SODIUM-DEXTROSE 2-4 GM/100ML-% IV SOLN
2.0000 g | Freq: Three times a day (TID) | INTRAVENOUS | Status: AC
Start: 2021-04-29 — End: 2021-04-29
  Administered 2021-04-29 (×2): 2 g via INTRAVENOUS
  Filled 2021-04-29 (×2): qty 100

## 2021-04-29 MED ORDER — ROSUVASTATIN CALCIUM 5 MG PO TABS
10.0000 mg | ORAL_TABLET | Freq: Every day | ORAL | Status: DC
  Administered 2021-04-30: 10 mg via ORAL
  Filled 2021-04-29: qty 2

## 2021-04-29 MED ORDER — SODIUM CHLORIDE 0.9 % IV SOLN: 500.0000 mL | Freq: Once | INTRAVENOUS | Status: DC | PRN

## 2021-04-29 MED ORDER — FENTANYL CITRATE (PF) 250 MCG/5ML IJ SOLN
INTRAMUSCULAR | Status: DC | PRN
  Administered 2021-04-29: 100 ug via INTRAVENOUS
  Administered 2021-04-29: 50 ug via INTRAVENOUS

## 2021-04-29 MED ORDER — CHLORHEXIDINE GLUCONATE 0.12 % MT SOLN
15.0000 mL | Freq: Once | OROMUCOSAL | Status: AC
  Administered 2021-04-29: 15 mL via OROMUCOSAL
  Filled 2021-04-29: qty 15

## 2021-04-29 MED ORDER — LABETALOL HCL 5 MG/ML IV SOLN: 10.0000 mg | INTRAVENOUS | Status: DC | PRN

## 2021-04-29 MED ORDER — ONDANSETRON HCL 4 MG/2ML IJ SOLN: 4.0000 mg | Freq: Four times a day (QID) | INTRAMUSCULAR | Status: DC | PRN

## 2021-04-29 MED ORDER — GUAIFENESIN-DM 100-10 MG/5ML PO SYRP: 15.0000 mL | ORAL_SOLUTION | ORAL | Status: DC | PRN

## 2021-04-29 MED ORDER — ONDANSETRON HCL 4 MG/2ML IJ SOLN
INTRAMUSCULAR | Status: DC | PRN
  Administered 2021-04-29: 4 mg via INTRAVENOUS

## 2021-04-29 MED ORDER — INSULIN ASPART 100 UNIT/ML IJ SOLN
0.0000 [IU] | Freq: Three times a day (TID) | INTRAMUSCULAR | Status: DC
  Administered 2021-04-29: 5 [IU] via SUBCUTANEOUS

## 2021-04-29 MED ORDER — FENTANYL CITRATE (PF) 100 MCG/2ML IJ SOLN: 25.0000 ug | INTRAMUSCULAR | Status: DC | PRN

## 2021-04-29 MED ORDER — ACETAMINOPHEN 650 MG RE SUPP: 325.0000 mg | RECTAL | Status: DC | PRN

## 2021-04-29 MED ORDER — FENTANYL CITRATE (PF) 250 MCG/5ML IJ SOLN
INTRAMUSCULAR | Status: AC
  Filled 2021-04-29: qty 5

## 2021-04-29 MED ORDER — SPIRONOLACTONE 25 MG PO TABS
25.0000 mg | ORAL_TABLET | Freq: Every day | ORAL | Status: DC
  Administered 2021-04-29 – 2021-04-30 (×2): 25 mg via ORAL
  Filled 2021-04-29 (×2): qty 1

## 2021-04-29 MED ORDER — PANTOPRAZOLE SODIUM 40 MG PO TBEC
40.0000 mg | DELAYED_RELEASE_TABLET | Freq: Every day | ORAL | Status: DC
  Administered 2021-04-29 – 2021-04-30 (×2): 40 mg via ORAL
  Filled 2021-04-29 (×2): qty 1

## 2021-04-29 MED ORDER — HEPARIN SODIUM (PORCINE) 1000 UNIT/ML IJ SOLN
INTRAMUSCULAR | Status: AC
  Filled 2021-04-29: qty 2

## 2021-04-29 MED ORDER — PROPOFOL 10 MG/ML IV BOLUS
INTRAVENOUS | Status: AC
  Filled 2021-04-29: qty 20

## 2021-04-29 MED ORDER — MIDAZOLAM HCL 2 MG/2ML IJ SOLN
INTRAMUSCULAR | Status: AC
  Filled 2021-04-29: qty 2

## 2021-04-29 MED ORDER — EPHEDRINE 5 MG/ML INJ
INTRAVENOUS | Status: AC
  Filled 2021-04-29: qty 5

## 2021-04-29 MED ORDER — METFORMIN HCL ER 500 MG PO TB24
500.0000 mg | ORAL_TABLET | Freq: Two times a day (BID) | ORAL | Status: DC
  Administered 2021-04-29 – 2021-04-30 (×2): 500 mg via ORAL
  Filled 2021-04-29 (×2): qty 1

## 2021-04-29 MED ORDER — CLOPIDOGREL BISULFATE 75 MG PO TABS
75.0000 mg | ORAL_TABLET | Freq: Every day | ORAL | Status: DC
  Administered 2021-04-30: 75 mg via ORAL
  Filled 2021-04-29: qty 1

## 2021-04-29 MED ORDER — MIDAZOLAM HCL 2 MG/2ML IJ SOLN
INTRAMUSCULAR | Status: DC | PRN
  Administered 2021-04-29: 2 mg via INTRAVENOUS

## 2021-04-29 MED ORDER — HEPARIN SODIUM (PORCINE) 5000 UNIT/ML IJ SOLN
5000.0000 [IU] | Freq: Three times a day (TID) | INTRAMUSCULAR | Status: DC
  Administered 2021-04-30: 5000 [IU] via SUBCUTANEOUS
  Filled 2021-04-29: qty 1

## 2021-04-29 MED ORDER — ISOSORBIDE DINITRATE 10 MG PO TABS
10.0000 mg | ORAL_TABLET | Freq: Three times a day (TID) | ORAL | Status: DC
  Administered 2021-04-29 – 2021-04-30 (×3): 10 mg via ORAL
  Filled 2021-04-29 (×3): qty 1

## 2021-04-29 MED ORDER — CEFAZOLIN SODIUM-DEXTROSE 2-4 GM/100ML-% IV SOLN
2.0000 g | INTRAVENOUS | Status: AC
  Administered 2021-04-29: 2 g via INTRAVENOUS
  Filled 2021-04-29: qty 100

## 2021-04-29 MED ORDER — SACUBITRIL-VALSARTAN 97-103 MG PO TABS
1.0000 | ORAL_TABLET | Freq: Two times a day (BID) | ORAL | Status: DC
  Administered 2021-04-29 – 2021-04-30 (×2): 1 via ORAL
  Filled 2021-04-29 (×2): qty 1

## 2021-04-29 MED ORDER — DEXAMETHASONE SODIUM PHOSPHATE 10 MG/ML IJ SOLN
INTRAMUSCULAR | Status: DC | PRN
  Administered 2021-04-29: 10 mg via INTRAVENOUS

## 2021-04-29 MED ORDER — ASPIRIN 81 MG PO CHEW
81.0000 mg | CHEWABLE_TABLET | Freq: Every day | ORAL | Status: DC
  Administered 2021-04-29 – 2021-04-30 (×2): 81 mg via ORAL
  Filled 2021-04-29 (×2): qty 1

## 2021-04-29 MED ORDER — CHLORHEXIDINE GLUCONATE CLOTH 2 % EX PADS: 6.0000 | MEDICATED_PAD | Freq: Once | CUTANEOUS | Status: DC

## 2021-04-29 MED ORDER — LIDOCAINE HCL (CARDIAC) PF 100 MG/5ML IV SOSY
PREFILLED_SYRINGE | INTRAVENOUS | Status: DC | PRN
  Administered 2021-04-29: 100 mg via INTRATRACHEAL

## 2021-04-29 MED ORDER — DEXAMETHASONE SODIUM PHOSPHATE 10 MG/ML IJ SOLN
INTRAMUSCULAR | Status: AC
  Filled 2021-04-29: qty 1

## 2021-04-29 MED ORDER — OXYCODONE HCL 5 MG PO TABS: 5.0000 mg | ORAL_TABLET | Freq: Once | ORAL | Status: DC | PRN

## 2021-04-29 MED ORDER — MAGNESIUM SULFATE 2 GM/50ML IV SOLN: 2.0000 g | Freq: Every day | INTRAVENOUS | Status: DC | PRN

## 2021-04-29 MED ORDER — OXYCODONE HCL 5 MG/5ML PO SOLN: 5.0000 mg | Freq: Once | ORAL | Status: DC | PRN

## 2021-04-29 MED ORDER — POTASSIUM CHLORIDE CRYS ER 20 MEQ PO TBCR: 20.0000 meq | EXTENDED_RELEASE_TABLET | Freq: Every day | ORAL | Status: DC | PRN

## 2021-04-29 MED ORDER — LACTATED RINGERS IV SOLN: INTRAVENOUS | Status: DC | PRN

## 2021-04-29 MED ORDER — ROCURONIUM 10MG/ML (10ML) SYRINGE FOR MEDFUSION PUMP - OPTIME
INTRAVENOUS | Status: DC | PRN
  Administered 2021-04-29: 40 mg via INTRAVENOUS
  Administered 2021-04-29: 100 mg via INTRAVENOUS

## 2021-04-29 MED ORDER — HEPARIN 6000 UNIT IRRIGATION SOLUTION
Status: AC
  Filled 2021-04-29: qty 500

## 2021-04-29 MED ORDER — METOPROLOL TARTRATE 5 MG/5ML IV SOLN: 2.0000 mg | INTRAVENOUS | Status: DC | PRN

## 2021-04-29 MED ORDER — PROTAMINE SULFATE 10 MG/ML IV SOLN
INTRAVENOUS | Status: DC | PRN
  Administered 2021-04-29: 50 mg via INTRAVENOUS

## 2021-04-29 MED ORDER — ROCURONIUM BROMIDE 10 MG/ML (PF) SYRINGE
PREFILLED_SYRINGE | INTRAVENOUS | Status: AC
  Filled 2021-04-29: qty 20

## 2021-04-29 SURGICAL SUPPLY — 66 items
BAG COUNTER SPONGE SURGICOUNT (BAG) ×4 IMPLANT
BAG SNAP BAND KOVER 36X36 (MISCELLANEOUS) ×4 IMPLANT
BENZOIN TINCTURE PRP APPL 2/3 (GAUZE/BANDAGES/DRESSINGS) ×4 IMPLANT
CANISTER SUCT 3000ML PPV (MISCELLANEOUS) ×4 IMPLANT
CANNULA VESSEL 3MM 2 BLNT TIP (CANNULA) ×8 IMPLANT
CATH BEACON 5 .035 65 KMP TIP (CATHETERS) ×4 IMPLANT
CATH OMNI FLUSH .035X70CM (CATHETERS) ×4 IMPLANT
CHLORAPREP W/TINT 26 (MISCELLANEOUS) ×4 IMPLANT
CLIP VESOCCLUDE MED 24/CT (CLIP) ×4 IMPLANT
CLIP VESOCCLUDE SM WIDE 24/CT (CLIP) ×4 IMPLANT
CLOSURE PERCLOSE PROSTYLE (VASCULAR PRODUCTS) ×8 IMPLANT
COVER DOME SNAP 22 D (MISCELLANEOUS) ×4 IMPLANT
DERMABOND ADHESIVE PROPEN (GAUZE/BANDAGES/DRESSINGS) ×2
DERMABOND ADVANCED (GAUZE/BANDAGES/DRESSINGS) ×1
DERMABOND ADVANCED .7 DNX12 (GAUZE/BANDAGES/DRESSINGS) ×3 IMPLANT
DERMABOND ADVANCED .7 DNX6 (GAUZE/BANDAGES/DRESSINGS) ×6 IMPLANT
DEVICE ENSNARE  12MMX20MM (VASCULAR PRODUCTS) ×4
DEVICE ENSNARE 12MMX20MM (VASCULAR PRODUCTS) ×3 IMPLANT
DRAIN CHANNEL 15F RND FF W/TCR (WOUND CARE) IMPLANT
ELECT REM PT RETURN 9FT ADLT (ELECTROSURGICAL) ×4
ELECTRODE REM PT RTRN 9FT ADLT (ELECTROSURGICAL) ×3 IMPLANT
EVACUATOR SILICONE 100CC (DRAIN) IMPLANT
GAUZE SPONGE 4X4 12PLY STRL (GAUZE/BANDAGES/DRESSINGS) ×4 IMPLANT
GLIDEWIRE ADV .035X260CM (WIRE) ×8 IMPLANT
GLOVE SURG POLYISO LF SZ8 (GLOVE) ×4 IMPLANT
GOWN STRL REUS W/ TWL LRG LVL3 (GOWN DISPOSABLE) ×6 IMPLANT
GOWN STRL REUS W/ TWL XL LVL3 (GOWN DISPOSABLE) ×3 IMPLANT
GOWN STRL REUS W/TWL LRG LVL3 (GOWN DISPOSABLE) ×8
GOWN STRL REUS W/TWL XL LVL3 (GOWN DISPOSABLE) ×4
KIT BASIN OR (CUSTOM PROCEDURE TRAY) ×4 IMPLANT
KIT ENCORE 26 ADVANTAGE (KITS) IMPLANT
KIT TURNOVER KIT B (KITS) ×4 IMPLANT
NS IRRIG 1000ML POUR BTL (IV SOLUTION) ×8 IMPLANT
PACK PERIPHERAL VASCULAR (CUSTOM PROCEDURE TRAY) ×4 IMPLANT
PAD ARMBOARD 7.5X6 YLW CONV (MISCELLANEOUS) ×8 IMPLANT
PATCH VASC XENOSURE 1CMX6CM (Vascular Products) ×4 IMPLANT
PATCH VASC XENOSURE 1X6 (Vascular Products) ×3 IMPLANT
SET MICROPUNCTURE 5F STIFF (MISCELLANEOUS) ×8 IMPLANT
SHEATH BRITE TIP 8FR 35CM (SHEATH) ×8 IMPLANT
SHEATH GUIDING 6.5 FR 55X73X9 (SHEATH) ×4 IMPLANT
SHEATH GUIDING 8.5FR 77X17 (SHEATH) ×4 IMPLANT
SHEATH PINNACLE 5F 10CM (SHEATH) ×4 IMPLANT
SHEATH PINNACLE 8F 10CM (SHEATH) ×4 IMPLANT
SHEATH PINNACLE 9F 10CM (SHEATH) ×4 IMPLANT
STENT VIABAHN 11X79X135 (Permanent Stent) ×3 IMPLANT
STENT VIABAHN 11X79X135MM (Permanent Stent) ×4 IMPLANT
STENT VIABAHNBX 10X39X135 (Permanent Stent) ×4 IMPLANT
STOPCOCK MORSE 400PSI 3WAY (MISCELLANEOUS) ×4 IMPLANT
STRIP CLOSURE SKIN 1/2X4 (GAUZE/BANDAGES/DRESSINGS) ×4 IMPLANT
SUT MNCRL AB 4-0 PS2 18 (SUTURE) ×4 IMPLANT
SUT PROLENE 5 0 C 1 24 (SUTURE) ×16 IMPLANT
SUT PROLENE 6 0 BV (SUTURE) ×12 IMPLANT
SUT VIC AB 2-0 CT1 27 (SUTURE) ×8
SUT VIC AB 2-0 CT1 TAPERPNT 27 (SUTURE) ×6 IMPLANT
SUT VIC AB 3-0 SH 27 (SUTURE) ×8
SUT VIC AB 3-0 SH 27X BRD (SUTURE) ×6 IMPLANT
SYR MEDRAD MARK V 150ML (SYRINGE) ×4 IMPLANT
TOWEL GREEN STERILE (TOWEL DISPOSABLE) ×8 IMPLANT
TOWEL GREEN STERILE FF (TOWEL DISPOSABLE) ×4 IMPLANT
TUBING INJECTOR 48 (MISCELLANEOUS) ×4 IMPLANT
UNDERPAD 30X36 HEAVY ABSORB (UNDERPADS AND DIAPERS) ×4 IMPLANT
WATER STERILE IRR 1000ML POUR (IV SOLUTION) ×4 IMPLANT
WIRE AMPLATZ SS-J .035X180CM (WIRE) IMPLANT
WIRE AMPLATZ SS-J .035X260CM (WIRE) IMPLANT
WIRE BENTSON .035X145CM (WIRE) ×4 IMPLANT
WIRE STARTER BENTSON 035X150 (WIRE) ×4 IMPLANT

## 2021-04-29 NOTE — Transfer of Care (Signed)
Immediate Anesthesia Transfer of Care Note  Patient: PATTERSON HOLLENBAUGH  Procedure(s) Performed: RIGHT ILIOFEMORAL ARTERY ENDARTERECTOMY WITH PROFUNDOPLASTY (Right) AORTAGRAM INSERTION OF BILATERAL COMMON ILIAC ARTERY KISSING STENTS (Right: Groin) ULTRASOUND GUIDANCE FOR VASCULAR ACCESS, LEFT FEMORAL ARTERY (Left: Groin) PATCH ANGIOPLASTY OF RIGHT COMMON FEMORAL ARTERY UISNG XENOSURE BOVINE PERICARDIUM PACTH (Right: Groin)  Patient Location: PACU  Anesthesia Type:General  Level of Consciousness: drowsy, patient cooperative and responds to stimulation  Airway & Oxygen Therapy: Patient Spontanous Breathing and Patient connected to nasal cannula oxygen  Post-op Assessment: Report given to RN, Post -op Vital signs reviewed and stable and Patient moving all extremities X 4  Post vital signs: Reviewed and stable  Last Vitals:  Vitals Value Taken Time  BP 117/77 04/29/21 1200  Temp 37 C 04/29/21 1200  Pulse 91 04/29/21 1200  Resp 30 04/29/21 1200  SpO2 95 % 04/29/21 1200  Vitals shown include unvalidated device data.  Last Pain:  Vitals:   04/29/21 0640  PainSc: 0-No pain         Complications: No notable events documented.

## 2021-04-29 NOTE — Op Note (Signed)
04/29/2021  11:56 AM  PATIENT:  [name]  65 y.o. male  PRE-OPERATIVE DIAGNOSIS:  Peripheral Artery Disease  POST-OPERATIVE DIAGNOSIS:  Peripheral Artery Disease  PROCEDURE:  Procedure(s): RIGHT ILIOFEMORAL ARTERY ENDARTERECTOMY WITH PROFUNDOPLASTY (Right) AORTAGRAM INSERTION OF BILATERAL COMMON ILIAC ARTERY KISSING STENTS (Right) ULTRASOUND GUIDANCE FOR VASCULAR ACCESS, LEFT FEMORAL ARTERY (Left) PATCH ANGIOPLASTY OF RIGHT COMMON FEMORAL ARTERY UISNG XENOSURE BOVINE PERICARDIUM PACTH (Right)  SURGEON:  Surgeon(s) and Role:    * Cherre Robins, MD - Primary  PHYSICIAN ASSISTANT:   ASSISTANTS: Risa Grill PA-C   ANESTHESIA:   general  EBL:  500 mL   BLOOD ADMINISTERED:none  DRAINS: none   LOCAL MEDICATIONS USED:  NONE  SPECIMEN:  No Specimen  DISPOSITION OF SPECIMEN:  N/A  COUNTS:  YES  TOURNIQUET:  * No tourniquets in log *  DICTATION: .Dragon Dictation  PLAN OF CARE: Admit to inpatient   PATIENT DISPOSITION:  PACU - hemodynamically stable.   Delay start of Pharmacological VTE agent (>24hrs) due to surgical blood loss or risk of bleeding: no

## 2021-04-29 NOTE — Progress Notes (Signed)
  Day of Surgery Note    Subjective:  No significant pain   Vitals:   04/29/21 1300 04/29/21 1345  BP: 108/66 94/67  Pulse: 81 78  Resp: (!) 22   Temp: 97.6 F (36.4 C) 97.8 F (36.6 C)  SpO2: 96% 94%    General appearance: Awake, alert in no apparent distress Cardiac: Heart rate and rhythm are regular Respirations: Nonlabored Incisions: Right groin incision is well approximated without bleeding or hematoma Left groin puncture site is soft without hematoma Extremities: Both feet are warm with intact sensation and motor function.   Pulse/Doppler exam: Brisk bilateral dorsalis pedis artery Doppler signals   Assessment/Plan:  This is a 65 y.o. male who is s/p  Right iliofemoral endarterectomy and bilateral CIA stent placement. No evidence of ongoing bleeding/hematoma. VSS.  Risa Grill, PA-C 04/29/2021 2:44 PM [phone]

## 2021-04-29 NOTE — Op Note (Addendum)
DATE OF SERVICE: 04/29/2021  PATIENT:  [name]  65 y.o. 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  ANESTHESIA:   general  EBL: 562mL  BLOOD ADMINISTERED:none  DRAINS: none   LOCAL MEDICATIONS USED:  NONE  SPECIMEN:  none  COUNTS: confirmed correct.  TOURNIQUET:  none  PATIENT DISPOSITION:  PACU - hemodynamically stable.   Delay start of Pharmacological VTE agent (>24hrs) due to surgical blood loss or risk of bleeding: no  INDICATION FOR PROCEDURE: [name] is a 65 y.o. 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 After careful discussion of risks, benefits, and alternatives the patient was offered iliac artery stenting and right femoral endarterectomy. The patient understood and wished to proceed.  OPERATIVE FINDINGS: Successful endovascular recanalization of flush occlusion of the right common iliac artery using SAFARI technique.  Good technical result from bilateral common iliac artery stenting.  Right femoral endarterectomy required exposure high on the iliac artery to achieve successful endarterectomy.  A near occlusive exophytic plaque was noted in the mid common femoral artery which was likely the culprit lesion.  I did have to revise the distal patch because of a flap noted in the superficial femoral artery upon completion  angiography.  On follow-up angiography this flap was resolved.  On completion the patient had excellent Doppler flow in his feet.  DESCRIPTION OF PROCEDURE: After identification of the patient in the pre-operative holding area, the patient was transferred to the operating room. The patient was positioned supine on the operating room table. Anesthesia was induced. The abdomen, groins, thighs were prepped and draped in standard fashion. A surgical pause was performed confirming correct patient, procedure, and operative location.  Using ultrasound guidance, the left common femoral artery was accessed with micropuncture technique. Fluoroscopy was used to confirm cannulation over the femoral head. The 18F sheath was upsized to 64F.   A Benson wire was advanced into the distal aorta. Over the wire an omni flush catheter was advanced to the level of L2. Aortogram was performed.  I tried to cross the lesion antegrade using an Omni Flush catheter and a Glidewire advantage, was not able to.  Longitudinal incision was made over the right common femoral artery in its bifurcation.  Incision was carried down through subtenons tissue until the femoral sheath was identified.  The femoral sheath was incised sharply.  The femoral vessels were exposed circumferentially from the common femoral artery bifurcation to the distal external iliac artery.  The area of exophytic plaque demonstrated on CT angiogram was easily palpated.  I accessed the proximal common femoral artery above the plaque using micropuncture technique.  I upsized this access to 8 [place].  I attempted to cross the right common iliac artery occlusion retrograde.  I developed a dissection plane and could not navigate through this subintimal plane.  The patient was systemically heparinized.  Activated clotting time measurements were used throughout the case to keep the patient  adequately anticoagulated.  Our access on the left was upsized to 9 [place].  A 6.5  French Oscor sheath was formed in the aortic bifurcation and used to cross the occlusion with a Glidewire advantage antegrade.  The wire was then snared using my right common femoral artery access and a Tulip snare to externalized the wire.  Over the working end of the wire through the right femoral access a Kumpe catheter was advanced into the left common iliac artery.  The wire was withdrawn and reinserted through the Kumpe catheter the wire was navigated into the visceral aorta.  Over the wire an 54F x 35 cm sheath was advanced across the right common iliac artery occlusion.  An angiogram was performed through the sheath to confirm true lumen position.  An additional Glidewire advantage was navigated from the left femoral access into the perivisceral aorta.  On the left-hand side a 10 x 39 mm VBX balloon expandable covered stent was positioned in the terminal aorta extending into the left common iliac artery to treat a "napkin ring" stenosis.  On the right-hand side a 11 x 79 mm VBX was advanced within the sheath to the same position in the terminal aorta.  The sheath was withdrawn.  The stents were deployed simultaneously using "kissing" technique.  The stents were deflated.  An Omni Flush catheter was advanced on the left-hand side above the stenting.  An angiogram was performed revealing excellent technical result.  Palpable pulse was noted in the right external iliac artery.  Clamps were applied to the mid right external iliac artery, profunda femoris artery, superficial femoral artery.  An anterior arteriotomy was made with 11 blade and extended with Potts scissors on the common femoral artery.  The exophytic plaque was identified.  There is a significant amount of posterior plaque which was well associated with this.  I could not remove the exophytic plaque without disrupting this tongue of plaque.  This extended well into the external iliac artery.  I then performed iliofemoral endarterectomy using  standard technique using a Primary school teacher.  A hemostat was used to perform an eversion endarterectomy of the external iliac artery.  A profundoplasty was necessary and performed with a hemostat using eversion technique.  Excellent backbleeding was noted from the superficial femoral artery and the profunda femoris artery.  Excellent inflow was noted from the external iliac artery.  A bovine pericardial patch was brought in the field and prepared as per the manufacturer's instructions.  The patch was sewn to the arteriotomy using continuous running suture of 5-0 Prolene.  Immediately prior to completion the patch was flushed and de-aired.  The arteriotomy repair was completed and clamps released.  A palpable pulse was noted throughout the patch angioplasty.  Completion angiography was performed this demonstrated excellent technical result of the common iliac artery stenting.  There was a small flap at the superficial femoral artery near its origin.  I elected to reopen the patch performed further endarterectomy, and tacked the flap down using a 6-0 Prolene suture.  The patch angioplasty was reclosed using 5-0 Prolene.  Completion angiography was again performed.  This revealed no further technical defects.  Satisfied we ended the case here.  Heparin was reversed with protamine.  Right groin wound was closed in layers using 2-0 Vicryl, 3-0 Vicryl, 4-0 Monocryl.  The left common femoral arteriotomy was closed using a Perclose device.  Doppler flow was confirmed in the feet at completion.  Upon completion of the case instrument and sharps  counts were confirmed correct. The patient was transferred to the PACU in good condition. I was present for all portions of the procedure.  Yevonne Aline. Stanford Breed, MD [facility] Phone Number: [phone] 04/29/2021 1:23 PM

## 2021-04-29 NOTE — Anesthesia Procedure Notes (Signed)
Procedure Name: Intubation Date/Time: 04/29/2021 7:34 AM Performed by: Claris Che, CRNA Pre-anesthesia Checklist: Patient identified, Emergency Drugs available, Suction available, Patient being monitored and Timeout performed Patient Re-evaluated:Patient Re-evaluated prior to induction Oxygen Delivery Method: Circle system utilized Preoxygenation: Pre-oxygenation with 100% oxygen Induction Type: IV induction and Cricoid Pressure applied Ventilation: Mask ventilation without difficulty Laryngoscope Size: Mac and 4 Grade View: Grade II Tube type: Oral Tube size: 8.0 mm Number of attempts: 1 Airway Equipment and Method: Stylet Placement Confirmation: ETT inserted through vocal cords under direct vision, positive ETCO2 and breath sounds checked- equal and bilateral Secured at: 24 cm Tube secured with: Tape Dental Injury: Teeth and Oropharynx as per pre-operative assessment

## 2021-04-29 NOTE — Interval H&P Note (Signed)
History and Physical Interval Note:  04/29/2021 7:13 AM  [name]  has presented today for surgery, with the diagnosis of PAD.  The various methods of treatment have been discussed with the patient and family. After consideration of risks, benefits and other options for treatment, the patient has consented to  Procedure(s): RIGHT FEMORAL ARTERY ENDARTERECTOMY (Right) INSERTION OF RIGHT COMMON ILIAC ARTERY STENT (Right) as a surgical intervention.  The patient's history has been reviewed, patient examined, no change in status, stable for surgery.  I have reviewed the patient's chart and labs.  Questions were answered to the patient's satisfaction.     Cherre Robins

## 2021-04-29 NOTE — Anesthesia Procedure Notes (Signed)
Arterial Line Insertion Start/End10/26/2022 6:55 AM, 04/29/2021 7:00 AM Performed by: Claris Che, CRNA, CRNA  Preanesthetic checklist: patient identified, IV checked, risks and benefits discussed and pre-op evaluation Lidocaine 1% used for infiltration Left, radial was placed Catheter size: 20 G Hand hygiene performed  and maximum sterile barriers used   Attempts: 1 Procedure performed without using ultrasound guided technique. Following insertion, dressing applied and Biopatch. Post procedure assessment: normal  Patient tolerated the procedure well with no immediate complications.

## 2021-04-30 ENCOUNTER — Encounter (HOSPITAL_COMMUNITY): Payer: Self-pay | Admitting: Vascular Surgery

## 2021-04-30 DIAGNOSIS — Z95820 Peripheral vascular angioplasty status with implants and grafts: Secondary | ICD-10-CM

## 2021-04-30 LAB — CBC
HCT: 38 % — ABNORMAL LOW (ref 39.0–52.0)
Hemoglobin: 13.1 g/dL (ref 13.0–17.0)
MCH: 32.6 pg (ref 26.0–34.0)
MCHC: 34.5 g/dL (ref 30.0–36.0)
MCV: 94.5 fL (ref 80.0–100.0)
Platelets: 177 10*3/uL (ref 150–400)
RBC: 4.02 MIL/uL — ABNORMAL LOW (ref 4.22–5.81)
RDW: 12.5 % (ref 11.5–15.5)
WBC: 12.2 10*3/uL — ABNORMAL HIGH (ref 4.0–10.5)
nRBC: 0 % (ref 0.0–0.2)

## 2021-04-30 LAB — BASIC METABOLIC PANEL
Anion gap: 10 (ref 5–15)
BUN: 15 mg/dL (ref 8–23)
CO2: 21 mmol/L — ABNORMAL LOW (ref 22–32)
Calcium: 9 mg/dL (ref 8.9–10.3)
Chloride: 103 mmol/L (ref 98–111)
Creatinine, Ser: 1.17 mg/dL (ref 0.61–1.24)
GFR, Estimated: >60 mL/min (ref >60)
Glucose, Bld: 185 mg/dL — ABNORMAL HIGH (ref 70–99)
Potassium: 4.2 mmol/L (ref 3.5–5.1)
Sodium: 134 mmol/L — ABNORMAL LOW (ref 135–145)

## 2021-04-30 LAB — LIPID PANEL
Cholesterol: 228 mg/dL — ABNORMAL HIGH (ref 0–200)
HDL: 35 mg/dL — ABNORMAL LOW (ref >40)
LDL Cholesterol: 174 mg/dL — ABNORMAL HIGH (ref 0–99)
Total CHOL/HDL Ratio: 6.5 RATIO
Triglycerides: 93 mg/dL (ref <150)
VLDL: 19 mg/dL (ref 0–40)

## 2021-04-30 LAB — GLUCOSE, CAPILLARY
Glucose-Capillary: 206 mg/dL — ABNORMAL HIGH (ref 70–99)
Glucose-Capillary: 165 mg/dL — ABNORMAL HIGH (ref 70–99)
Glucose-Capillary: 179 mg/dL — ABNORMAL HIGH (ref 70–99)

## 2021-04-30 MED ORDER — CLOPIDOGREL BISULFATE 75 MG PO TABS: 75.0000 mg | ORAL_TABLET | Freq: Every day | ORAL | 3 refills | Status: DC

## 2021-04-30 MED ORDER — OXYCODONE-ACETAMINOPHEN 5-325 MG PO TABS: 1.0000 | ORAL_TABLET | Freq: Four times a day (QID) | ORAL | 0 refills | Status: DC | PRN

## 2021-04-30 NOTE — Discharge Summary (Signed)
Bypass Discharge Summary Patient ID: [name] [id number] 65 y.o. 1956-05-20  Admit date: 04/29/2021  Discharge date and time: No discharge date for patient encounter.   Admitting Physician: Cherre Robins, MD   Discharge Physician: Jamelle Haring, MD  Admission Diagnoses: PAD (peripheral artery disease) [facility]) [I73.9]  Discharge Diagnoses: PAD  Admission Condition: fair  Discharged Condition: good  Indication for Admission:  [name] is a 65 y.o. 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 After careful discussion of risks, benefits, and alternatives the patient was offered iliac artery stenting and right femoral endarterectomy. The patient understood and wished to proceed.  Hospital Course: Patient was admitted on 04/19/21 and underwent ultrasound guided left femoral access, aortogram, bilateral common iliac kissing angioplasty and stenting and right iliofemoral endarterectomy and profundoplasty with bovine pericardial patch angioplasty by Dr. Stanford Breed. He tolerated the procedure well and was transferred to the recovery room in stable condition  By POD #1, he was doing well. Pain minimal at surgical site. Right groin incision clean, dry and intact with mild swelling and ecchymosis present. No hematoma. Brisk doppler signals in right foot. Ambulating without difficulty. Right lower extremity rest pain resolved. Voiding without difficulty. PT/ OT evaluated patient and did not feel that he needed any follow up. He remained stable for discharge home. He will continue on Aspirin, statin and new prescription for Plavix was sent to his pharmacy. PDMP was reviewed and pain medication sent to his pharmacy for post operative pain management. He has follow up arranged in 1 month with non invasive studies  Consults: None  Treatments: therapies: PT and OT and surgery:  1) US guided left common femoral  access 2) Aortogram 3) Bilateral common iliac "kissing" angioplasty and stenting (RCIA 11x44mm VBX, LCIA 10x37mm VBX) 4) Right iliofemoral endarterectomy and profundaplasty with bovine pericardial patch angioplasty   Disposition: Discharge disposition: 01-Home or Self Care       - For [facility] Registry use ---  Post-op:  Wound infection: No  Graft infection: No  Transfusion: No  If yes, 0 units given New Arrhythmia: No Patency judged by: Valu.Nieves ] Dopper only, [ ]  Palpable graft pulse, [ ]  Palpable distal pulse, [ ]  ABI inc. > 0.15, [ ]  Duplex D/C Ambulatory Status: Ambulatory  Complications: MI: Valu.Nieves ] No, [ ]  Troponin only, [ ]  EKG or Clinical CHF: No Resp failure: Valu.Nieves ] none, [ ]  Pneumonia, [ ]  Ventilator Chg in renal function: [ X] none, [ ]  Inc. Cr > 0.5, [ ]  Temp. Dialysis, [ ]  Permanent dialysis Stroke: Valu.Nieves ] None, [ ]  Minor, [ ]  Major Return to OR: No  Reason for return to OR: [ ]  Bleeding, [ ]  Infection, [ ]  Thrombosis, [ ]  Revision  Discharge medications: Statin use:  Yes ASA use:  Yes Plavix use:  Yes Beta blocker use: Yes Coumadin use: No  for medical reason not indicated    Patient Instructions:  Allergies as of 04/30/2021   No Known Allergies      Medication List     STOP taking these medications    traMADol 50 MG tablet Commonly known as: ULTRAM       TAKE these medications    aspirin 81 MG chewable tablet Chew 81 mg by mouth daily. Take 1 by mouth daily   b complex vitamins capsule Take 1 capsule by mouth daily.   carvedilol 25 MG tablet Commonly known as: COREG TAKE  1 TABLET (25 MG TOTAL) BY MOUTH 2 (TWO) TIMES DAILY WITH A MEAL.   clopidogrel 75 MG tablet Commonly known as: PLAVIX Take 1 tablet (75 mg total) by mouth daily at 6 (six) AM. Start taking on: May 01, 2021   empagliflozin 25 MG Tabs tablet Commonly known as: Jardiance Take 25 mg by mouth daily. What changed: how much to take   hydrALAZINE 10 MG tablet Commonly known  as: APRESOLINE Take 10 mg by mouth 3 (three) times daily.   isosorbide dinitrate 10 MG tablet Commonly known as: ISORDIL Take 10 mg by mouth 3 (three) times daily.   linagliptin 5 MG Tabs tablet Commonly known as: Tradjenta Take 1 tablet (5 mg total) by mouth daily.   metFORMIN 500 MG 24 hr tablet Commonly known as: GLUCOPHAGE-XR Take 500 mg by mouth in the morning and at bedtime.   oxyCODONE-acetaminophen 5-325 MG tablet Commonly known as: PERCOCET/ROXICET Take 1 tablet by mouth every 6 (six) hours as needed for moderate pain.   pantoprazole 40 MG tablet Commonly known as: PROTONIX TAKE 1 TABLET (40 MG TOTAL) BY MOUTH DAILY. What changed: when to take this   rosuvastatin 20 MG tablet Commonly known as: CRESTOR Take 10 mg by mouth daily.   sacubitril-valsartan 97-103 MG Commonly known as: ENTRESTO Take 1 tablet by mouth 2 (two) times daily.   spironolactone 25 MG tablet Commonly known as: ALDACTONE Take 25 mg by mouth daily.               Discharge Care Instructions  (From admission, onward)           Start     Ordered   04/30/21 0000  Discharge wound care:       Comments: Keep dry for 48 hours. Wash with mild soap and water, pat dry. Do not soak in bathtub   04/30/21 0802           Activity: activity as tolerated, no driving while on analgesics, and no heavy lifting for 4 weeks Diet: cardiac diet Wound Care: keep wound clean and dry and may wash with mild soap and water, pat dry. Do not soak in bathtub  Follow-up with Dr. Stanford Breed in 1 month.  Signed: Karoline Caldwell 04/30/2021 8:04 AM

## 2021-04-30 NOTE — Progress Notes (Signed)
Pt discharged home with wife. AVS reviewed with pt and wife. Telemetry box and IV removed. Pt left with all belongings. Pt discharged via wheelchair and was accompanied by a Wilfrid Lund.

## 2021-04-30 NOTE — Evaluation (Signed)
Occupational Therapy Evaluation/Discharge Patient Details Name: [name] MRN: [id number] DOB: 26-Apr-1956 Today's Date: 04/30/2021   History of Present Illness Pt is a 65 y/o male with aortoiliac occlusive disaese causing ischemic rest pain of the right lower extremity. Pt elected to undergo R femoral artery endartertomy. PMH: CAD, PAD< DM, CABG (2003), HLD, HTN, MI, TIA   Clinical Impression   PTA, pt lives with spouse and reports Independent with all daily tasks without use of AD. Pt typically active at baseline though limited by LE pain. Pt presents now s/p procedure above with significant pain improvements. Pt able to demo ADLs and mobility in room Independently without LOB or safety concerns. Pt able to demo simulated steps to enter home without issue (2 steps). Encouraged continued independent mobility and progression of activity levels at home. BP readings soft but stable with no dizziness (90s/70s). Pt verbalized understanding of education with no further skilled OT services needed at this time.      Recommendations for follow up therapy are one component of a multi-disciplinary discharge planning process, led by the attending physician.  Recommendations may be updated based on patient status, additional functional criteria and insurance authorization.   Follow Up Recommendations  No OT follow up    Assistance Recommended at Discharge None  Functional Status Assessment  Patient has not had a recent decline in their functional status  Equipment Recommendations  None recommended by OT    Recommendations for Other Services       Precautions / Restrictions Precautions Precautions: Other (comment) Precaution Comments: soft BP Restrictions Weight Bearing Restrictions: No      Mobility Bed Mobility               General bed mobility comments: up in chair    Transfers Overall transfer level: Independent Equipment used: None                      Balance  Overall balance assessment: Independent                                         ADL either performed or assessed with clinical judgement   ADL Overall ADL's : Independent                                       General ADL Comments: able to mobilize in room (initially with RW, then noted to pick up RW so trialed without AD) without LOB. Able to complete grooming tasks standing at sink, had already walked in hallway. Pt does not wear socks (barefoot at home or tennis shoes without socks that he leaves tied). Anticipate pt to quickly be able to bend to feet for task while healing but reports his wife can assist as needed.     Vision Baseline Vision/History: 0 No visual deficits Ability to See in Adequate Light: 0 Adequate Vision Assessment?: No apparent visual deficits     Perception     Praxis      Pertinent Vitals/Pain Pain Assessment: Faces Faces Pain Scale: Hurts a little bit Pain Location: groin when seated Pain Descriptors / Indicators: Sore Pain Intervention(s): Monitored during session     Hand Dominance Right   Extremity/Trunk Assessment Upper Extremity Assessment Upper Extremity Assessment: Overall WFL for tasks assessed  Lower Extremity Assessment Lower Extremity Assessment: Defer to PT evaluation   Cervical / Trunk Assessment Cervical / Trunk Assessment: Normal   Communication Communication Communication: No difficulties   Cognition Arousal/Alertness: Awake/alert Behavior During Therapy: WFL for tasks assessed/performed Overall Cognitive Status: Within Functional Limits for tasks assessed                                       General Comments       Exercises     Shoulder Instructions      Home Living Family/patient expects to be discharged to:: Private residence Living Arrangements: Spouse/significant other Available Help at Discharge: Family;Other (Comment) (close to 24/7) Type of Home:  House Home Access: Stairs to enter [employer] of Steps: 2 Entrance Stairs-Rails: Right;Left Home Layout: One level     Bathroom Shower/Tub: Tub/shower unit;Walk-in shower   Bathroom Toilet: Standard     Home Equipment: Shower seat   Additional Comments: Wife works from home and can assist as needed. Wife does go to [employer] once a week for work      Prior Functioning/Environment Prior Level of Function : Independent/Modified Independent;Driving;Working/employed             Mobility Comments: No use of AD, no falls but limited by pain prior to procedure ADLs Comments: Independent with ADLs, IADLs. Active at baseline. currently working at [employer] per chart        OT Problem List:        OT Treatment/Interventions:      OT Goals(Current goals can be found in the care plan section) Acute Rehab OT Goals Patient Stated Goal: return to active baseline now that pain is improved OT Goal Formulation: All assessment and education complete, DC therapy  OT Frequency:     Barriers to D/C:            Co-evaluation              AM-PAC OT "6 Clicks" Daily Activity     Outcome Measure Help from another person eating meals?: None Help from another person taking care of personal grooming?: None Help from another person toileting, which includes using toliet, bedpan, or urinal?: None Help from another person bathing (including washing, rinsing, drying)?: None Help from another person to put on and taking off regular upper body clothing?: None Help from another person to put on and taking off regular lower body clothing?: None 6 Click Score: 24   End of Session Nurse Communication: Mobility status;Other (comment) (question about meds, ok to go to bathroom independently pending tele monitor)  Activity Tolerance: Patient tolerated treatment well Patient left: in chair;with call bell/phone within reach  OT Visit Diagnosis: Pain Pain - part  of body:  (groin)                Time: 7681-1572 OT Time Calculation (min): 16 min Charges:  OT General Charges $OT Visit: 1 Visit OT Evaluation $OT Eval Low Complexity: 1 Low  Malachy Chamber, OTR/L Acute Rehab Services Office: [phone]   Layla Maw 04/30/2021, 8:01 AM

## 2021-04-30 NOTE — Evaluation (Signed)
Physical Therapy Evaluation & Discharge Patient Details Name: [name] MRN: [id number] DOB: 1955-09-10 Today's Date: 04/30/2021  History of Present Illness  Pt is a 65 y/o male admitted 04/29/21 for R femoral artery endarterectomy. PMH includes CAD, PAD, DM, CABG (2003), HLD, HTN, MI, TIA.   Clinical Impression  Patient evaluated by Physical Therapy with no further acute PT needs identified. PTA, pt independent without DME, works, lives with supportive wife. Today, pt independent with mobility. Educ re: activity recommendations, positioning, edema control, DVT prevention, therex, importance of mobility. All education has been completed and the patient has no further questions. Acute PT is signing off. Thank you for this referral.   Recommendations for follow up therapy are one component of a multi-disciplinary discharge planning process, led by the attending physician.  Recommendations may be updated based on patient status, additional functional criteria and insurance authorization.  Follow Up Recommendations No PT follow up    Assistance Recommended at Discharge    Functional Status Assessment    Equipment Recommendations  None recommended by PT    Recommendations for Other Services       Precautions / Restrictions Precautions Precautions: Other (comment) Precaution Comments: soft BP Restrictions Weight Bearing Restrictions: No      Mobility  Bed Mobility               General bed mobility comments: Received sitting in recliner    Transfers Overall transfer level: Independent Equipment used: None                    Ambulation/Gait             General Gait Details: deferred, pt reports indep ambulating prior to session  Stairs            Wheelchair Mobility    Modified Rankin (Stroke Patients Only)       Balance Overall balance assessment: No apparent balance deficits (not formally assessed)                                            Pertinent Vitals/Pain Pain Assessment: Faces Faces Pain Scale: Hurts a little bit Pain Location: R > L groin incision Pain Descriptors / Indicators: Sore Pain Intervention(s): Monitored during session    Home Living Family/patient expects to be discharged to:: Private residence Living Arrangements: Spouse/significant other Available Help at Discharge: Family;Available 24 hours/day Type of Home: House Home Access: Stairs to enter Entrance Stairs-Rails: Psychiatric nurse of Steps: 2   Home Layout: One level Home Equipment: Shower seat Additional Comments: Wife works from home and can assist as needed. Wife does go to [employer] [facility] [employer] once a week for work    Prior Function Prior Level of Function : Independent/Modified Independent;Driving;Working/employed             Mobility Comments: No use of AD, no falls but limited by pain prior to procedure ADLs Comments: Independent with ADLs, IADLs. Active at baseline. currently working at [employer] per chart     Hand Dominance   Dominant Hand: Right    Extremity/Trunk Assessment   Upper Extremity Assessment Upper Extremity Assessment: Overall WFL for tasks assessed    Lower Extremity Assessment Lower Extremity Assessment: Overall WFL for tasks assessed (expected post-op pain)    Cervical / Trunk Assessment Cervical / Trunk Assessment: Normal  Communication   Communication: No  difficulties  Cognition Arousal/Alertness: Awake/alert Behavior During Therapy: WFL for tasks assessed/performed Overall Cognitive Status: Within Functional Limits for tasks assessed                                          General Comments General comments (skin integrity, edema, etc.): Educ re: activity recommendations, positioning, edema control, DVT prevention, therex    Exercises Other Exercises Other Exercises: [employer] HEP handout (access code #[email or web address]) provided -  butterfly stretch, supine hip flexor stretch, partial squats & standing calf raises with counter support   Assessment/Plan    PT Assessment Patient does not need any further PT services  PT Problem List         PT Treatment Interventions      PT Goals (Current goals can be found in the Care Plan section)  Acute Rehab PT Goals PT Goal Formulation: All assessment and education complete, DC therapy    Frequency     Barriers to discharge        Co-evaluation               AM-PAC PT "6 Clicks" Mobility  Outcome Measure Help needed turning from your back to your side while in a flat bed without using bedrails?: None Help needed moving from lying on your back to sitting on the side of a flat bed without using bedrails?: None Help needed moving to and from a bed to a chair (including a wheelchair)?: None Help needed standing up from a chair using your arms (e.g., wheelchair or bedside chair)?: None Help needed to walk in hospital room?: None Help needed climbing 3-5 steps with a railing? : None 6 Click Score: 24    End of Session   Activity Tolerance: Patient tolerated treatment well Patient left: in chair;with call bell/phone within reach Nurse Communication: Mobility status PT Visit Diagnosis: Other abnormalities of gait and mobility (R26.89);Pain    Time: 2010-0712 PT Time Calculation (min) (ACUTE ONLY): 15 min   Charges:   PT Evaluation $PT Eval Low Complexity: 1 Low     Mabeline Caras, PT, DPT Acute Rehabilitation Services  Pager [phone] Office [place] 04/30/2021, 9:45 AM

## 2021-04-30 NOTE — Progress Notes (Addendum)
  Progress Note    04/30/2021 7:49 AM 1 Day Post-Op  Subjective:  no complaints, up walking with OT   Vitals:   04/30/21 0425 04/30/21 0727  BP: 101/62 96/63  Pulse: 83 74  Resp: 20 18  Temp: 98.1 F (36.7 C) (!) 97.3 F (36.3 C)  SpO2: 91% 95%   Physical Exam: Cardiac:  regular Lungs:  non labored Incisions:  Right groin incision intact and well appearing. Mild swelling present in mid incision. Soft. Mild ecchymosis. No drainage. No hematoma Extremities:  BLE well perfused and warm  Abdomen:  soft, non distended Neurologic: alert and oriented  CBC    Component Value Date/Time   WBC 12.2 (H) 04/30/2021 0041   RBC 4.02 (L) 04/30/2021 0041   HGB 13.1 04/30/2021 0041   HCT 38.0 (L) 04/30/2021 0041   PLT 177 04/30/2021 0041   MCV 94.5 04/30/2021 0041   MCH 32.6 04/30/2021 0041   MCHC 34.5 04/30/2021 0041   RDW 12.5 04/30/2021 0041   LYMPHSABS 2.7 05/04/2013 1521   MONOABS 0.7 05/04/2013 1521   EOSABS 0.1 05/04/2013 1521   BASOSABS 0.0 05/04/2013 1521    BMET    Component Value Date/Time   NA 134 (L) 04/30/2021 0041   K 4.2 04/30/2021 0041   CL 103 04/30/2021 0041   CO2 21 (L) 04/30/2021 0041   GLUCOSE 185 (H) 04/30/2021 0041   BUN 15 04/30/2021 0041   CREATININE 1.17 04/30/2021 0041   CALCIUM 9.0 04/30/2021 0041   GFRNONAA >60 04/30/2021 0041   GFRAA >60 05/22/2018 1049    INR    Component Value Date/Time   INR 1.1 04/28/2021 0914     Intake/Output Summary (Last 24 hours) at 04/30/2021 0749 Last data filed at 04/30/2021 0427 Gross per 24 hour  Intake 1600 ml  Output 2025 ml  Net -425 ml     Assessment/Plan:  65 y.o. male is s/p  RIGHT ILIOFEMORAL ARTERY ENDARTERECTOMY WITH PROFUNDOPLASTY (Right) AORTAGRAM INSERTION OF BILATERAL COMMON ILIAC ARTERY KISSING STENTS (Right) ULTRASOUND GUIDANCE FOR VASCULAR ACCESS, LEFT FEMORAL ARTERY (Left) PATCH ANGIOPLASTY OF RIGHT COMMON FEMORAL ARTERY UISNG XENOSURE BOVINE PERICARDIUM PACTH (Right) 1 Day  Post-Op   Doing great post op Right groin incision is c/d/I. Mild swelling along mid incision. No hematoma RLE and LLE well perfused and warm Tolerating voiding without difficulty. Ambulated without difficulty Hemodynamically stable Continue Aspirin, Plavix, Statin He is stable for discharge home today He will have follow up in 1 month with Aorto/ iliac duplex  DVT prophylaxis:  sq heparin   Karoline Caldwell, PA-C [facility] [phone] 04/30/2021 7:49 AM  VASCULAR STAFF ADDENDUM: I have independently interviewed and examined the patient. I agree with the above.  Looks great POD#1 R iliofemoral endarterectomy with profundaplasty and bilateral CIA stenting. Rest pain resolved. Ambulating without difficulty. R groin with mild ecchymosis.  Discharge instructions reviewed. Follow up with me in 1 month.  Yevonne Aline. Stanford Breed, MD [facility] Phone Number: [phone] 04/30/2021 11:15 AM

## 2021-04-30 NOTE — Discharge Instructions (Signed)
[facility]  Discharge Instructions  Lower Extremity Angiogram; Angioplasty/Stenting  Please refer to the following instructions for your post-procedure care. Your surgeon or physician assistant will discuss any changes with you.  Activity  Avoid lifting more than 8 pounds (1 gallons of milk) for 5 days after your procedure. You may walk as much as you can tolerate. It's OK to drive after 72 hours or when you are no longer taking prescription pain medications.You can slowly return to normal activities during the month after your surgery. Avoid strenuous activity and heavy lifting until your doctor tells you it's OK. Avoid activities such as vacuuming or swinging a golf club. . It is also normal to have difficulty with sleep habits, eating and bowel movement after surgery. These will go away with time.  Bathing/Showering  Shower daily after you go home. Do not soak in a bathtub, hot tub, or swim until the incision heals completely.  Incision Care  Clean your incision with mild soap and water. Shower every day. Pat the area dry with a clean towel. You do not need a bandage unless otherwise instructed. Do not apply any ointments or creams to your incision. If you have open wounds you will be instructed how to care for them or a visiting nurse may be arranged for you. If you have staples or sutures along your incision they will be removed at your post-op appointment. You may have skin glue on your incision. Do not peel it off. It will come off on its own in about one week.  Wash the groin wound with soap and water daily and pat dry. (No tub bath-only shower)  Then put a dry gauze or washcloth in the groin to keep this area dry to help prevent wound infection.  Do this daily and as needed.  Do not use Vaseline or neosporin on your incisions.  Only use soap and water on your incisions and then protect and keep dry.   Diet  Resume your pre-procedure diet. There are  no special food restrictions following this procedure. All patients with peripheral vascular disease should follow a low fat/low cholesterol diet. In order to heal from your surgery, it is CRITICAL to get adequate nutrition. Your body requires vitamins, minerals, and protein. Vegetables are the best source of vitamins and minerals. Vegetables also provide the perfect balance of protein. Processed food has little nutritional value, so try to avoid this.  Medications  Resume taking all your medications unless your doctor or physician assistant tells you not to. If your incision is causing pain, you may take over-the-counter pain relievers such as acetaminophen (Tylenol). If you were prescribed a stronger pain medication, please aware these medication can cause nausea and constipation. Prevent nausea by taking the medication with a snack or meal. Avoid constipation by drinking plenty of fluids and eating foods with high amount of fiber, such as fruits, vegetables, and grains. Take Colace 100 mg (an over-the-counter stool softener) twice a day as needed for constipation.   Do not take Tylenol if you are taking prescription pain medications.  Follow Up  Follow up will be arranged at the time of your procedure. You may have an office visit scheduled or may be scheduled for surgery. Ask your surgeon if you have any questions.  Please call us immediately for any of the following conditions: Severe or worsening pain your legs or feet at rest or with walking. Increased pain, redness, drainage at your groin puncture site. Fever  of 101 degrees or higher. If you have any mild or slow bleeding from your puncture site: lie down, apply firm constant pressure over the area with a piece of gauze or a clean wash cloth for 30 minutes- no peeking!, call 911 right away if you are still bleeding after 30 minutes, or if the bleeding is heavy and unmanageable.  Reduce your risk factors of vascular disease:  Stop smoking.  If you would like help call [facility] at 1-800-QUIT-NOW [phone]) or [place] at [phone]. Manage your cholesterol Maintain a desired weight Control your diabetes Keep your blood pressure down  If you have any questions, please call the office at [phone]

## 2021-04-30 NOTE — Anesthesia Postprocedure Evaluation (Signed)
Anesthesia Post Note  Patient: [name]  Procedure(s) Performed: RIGHT ILIOFEMORAL ARTERY ENDARTERECTOMY WITH PROFUNDOPLASTY (Right) AORTAGRAM INSERTION OF BILATERAL COMMON ILIAC ARTERY KISSING STENTS (Right: Groin) ULTRASOUND GUIDANCE FOR VASCULAR ACCESS, LEFT FEMORAL ARTERY (Left: Groin) PATCH ANGIOPLASTY OF RIGHT COMMON FEMORAL ARTERY UISNG XENOSURE BOVINE PERICARDIUM PACTH (Right: Groin)     Patient location during evaluation: PACU Anesthesia Type: General Level of consciousness: awake and alert Pain management: pain level controlled Vital Signs Assessment: post-procedure vital signs reviewed and stable Respiratory status: spontaneous breathing, nonlabored ventilation, respiratory function stable and patient connected to nasal cannula oxygen Cardiovascular status: blood pressure returned to baseline and stable Postop Assessment: no apparent nausea or vomiting Anesthetic complications: no   No notable events documented.  Last Vitals:  Vitals:   04/30/21 0425 04/30/21 0727  BP: 101/62 96/63  Pulse: 83 74  Resp: 20 18  Temp: 36.7 C (!) 36.3 C  SpO2: 91% 95%    Last Pain:  Vitals:   04/30/21 0727  TempSrc: Oral  PainSc:                  [place]

## 2021-05-11 ENCOUNTER — Other Ambulatory Visit: Payer: Self-pay

## 2021-05-11 DIAGNOSIS — I739 Peripheral vascular disease, unspecified: Secondary | ICD-10-CM

## 2021-06-01 NOTE — Progress Notes (Signed)
[facility] [place] PROGRESS NOTE  ASSESSMENT / PLAN: [name] is a 65 y.o. male status post bilateral common iliac "kissing" angioplasty and stenting (RCIA 11x58mm VBX, LCIA 10x73mm VBX) and right iliofemoral endarterectomy and profundaplasty with bovine pericardial patch angioplasty 04/29/21 for rest pain. He has complete symptomatic relief. Follow up with me in 3 months with ABI for surveillance.  SUBJECTIVE: Walking without difficulty. No claudication or rest pain.   OBJECTIVE: BP 95/64 (BP Location: Left Arm, Patient Position: Sitting, Cuff Size: Large)   Pulse 60   Temp 98.1 F (36.7 C)   Resp 20   Ht 6' (1.829 m)   Wt 226 lb (102.5 kg)   SpO2 96%   BMI 30.65 kg/m   Constitutional: well appearing. no acute distress. Cardiac: 5/5 throughout. Normal gait and station. Pulmonary: unlabored Abdomen: soft. Vascular: right groin with one small focus of superficial dehiscence. Otherwise well healed. Warm, well perfused legs.  CBC Latest Ref Rng & Units 04/30/2021 04/28/2021 05/22/2018  WBC 4.0 - 10.5 K/uL 12.2(H) 5.2 7.9  Hemoglobin 13.0 - 17.0 g/dL 13.1 15.2 15.1  Hematocrit 39.0 - 52.0 % 38.0(L) 45.1 45.9  Platelets 150 - 400 K/uL 177 188 192     CMP Latest Ref Rng & Units 04/30/2021 04/28/2021 03/31/2021  Glucose 70 - 99 mg/dL 185(H) 178(H) 111(H)  BUN 8 - 23 mg/dL 15 13 19   Creatinine 0.61 - 1.24 mg/dL 1.17 1.14 1.16  Sodium 135 - 145 mmol/L 134(L) 133(L) 138  Potassium 3.5 - 5.1 mmol/L 4.2 4.1 4.9  Chloride 98 - 111 mmol/L 103 103 102  CO2 22 - 32 mmol/L 21(L) 18(L) 27  Calcium 8.9 - 10.3 mg/dL 9.0 9.2 10.0  Total Protein 6.5 - 8.1 g/dL - 7.0 7.8  Total Bilirubin 0.3 - 1.2 mg/dL - 1.0 0.7  Alkaline Phos 38 - 126 U/L - 39 48  AST 15 - 41 U/L - 26 19  ALT 0 - 44 U/L - 30 21    ABI  +-------+-----------+-----------+------------+------------+  ABI/TBIToday's ABIToday's TBIPrevious ABIPrevious TBI   +-------+-----------+-----------+------------+------------+  Right  0.80       0.60       0.54        0.20          +-------+-----------+-----------+------------+------------+  Left   1.28       0.71       1.00        0.85          +-------+-----------+-----------+------------+------------+   Yevonne Aline. Stanford Breed, MD [facility] Phone Number: [phone] 06/02/2021 1:47 PM

## 2021-06-02 ENCOUNTER — Ambulatory Visit (INDEPENDENT_AMBULATORY_CARE_PROVIDER_SITE_OTHER): Payer: Medicare Other | Admitting: Vascular Surgery

## 2021-06-02 ENCOUNTER — Encounter (HOSPITAL_COMMUNITY): Payer: Medicare Other

## 2021-06-02 ENCOUNTER — Encounter (HOSPITAL_COMMUNITY): Payer: Self-pay

## 2021-06-02 ENCOUNTER — Ambulatory Visit (HOSPITAL_COMMUNITY)
Admission: RE | Admit: 2021-06-02 | Discharge: 2021-06-02 | Disposition: A | Discharge status: Home / Self Care | Payer: Medicare Other | Source: Ambulatory Visit | Attending: Vascular Surgery | Admitting: Vascular Surgery

## 2021-06-02 ENCOUNTER — Other Ambulatory Visit: Payer: Self-pay

## 2021-06-02 ENCOUNTER — Encounter: Payer: Self-pay | Admitting: Vascular Surgery

## 2021-06-02 VITALS — BP 95/64 | HR 60 | Temp 98.1°F | Resp 20 | Ht 72.0 in | Wt 226.0 lb

## 2021-06-02 DIAGNOSIS — I739 Peripheral vascular disease, unspecified: Secondary | ICD-10-CM

## 2021-06-05 ENCOUNTER — Other Ambulatory Visit: Payer: Self-pay

## 2021-06-05 DIAGNOSIS — I739 Peripheral vascular disease, unspecified: Secondary | ICD-10-CM

## 2021-06-08 ENCOUNTER — Telehealth: Payer: Self-pay

## 2021-06-08 NOTE — Telephone Encounter (Signed)
Pt called to ask if he can put a bandaid or some kind of small dressing over the fold of R groin that has been slightly wet from a clear drainage. Pt denies any increase in drainage, no odor present, no swelling/pain. Pt has been advised to keep area clean and dry and to call us if anything changes or does not continue to improve.

## 2021-06-30 ENCOUNTER — Encounter: Payer: Self-pay | Admitting: Internal Medicine

## 2021-06-30 ENCOUNTER — Ambulatory Visit (INDEPENDENT_AMBULATORY_CARE_PROVIDER_SITE_OTHER): Payer: Medicare Other | Admitting: Internal Medicine

## 2021-06-30 ENCOUNTER — Other Ambulatory Visit: Payer: Self-pay

## 2021-06-30 DIAGNOSIS — E1169 Type 2 diabetes mellitus with other specified complication: Secondary | ICD-10-CM | POA: Diagnosis not present

## 2021-06-30 DIAGNOSIS — I2510 Atherosclerotic heart disease of native coronary artery without angina pectoris: Secondary | ICD-10-CM

## 2021-06-30 DIAGNOSIS — E669 Obesity, unspecified: Secondary | ICD-10-CM

## 2021-06-30 DIAGNOSIS — Z6831 Body mass index (BMI) 31.0-31.9, adult: Secondary | ICD-10-CM

## 2021-06-30 DIAGNOSIS — M1991 Primary osteoarthritis, unspecified site: Secondary | ICD-10-CM

## 2021-06-30 DIAGNOSIS — I739 Peripheral vascular disease, unspecified: Secondary | ICD-10-CM | POA: Diagnosis not present

## 2021-06-30 DIAGNOSIS — E6609 Other obesity due to excess calories: Secondary | ICD-10-CM | POA: Diagnosis not present

## 2021-06-30 DIAGNOSIS — M1990 Unspecified osteoarthritis, unspecified site: Secondary | ICD-10-CM | POA: Insufficient documentation

## 2021-06-30 MED ORDER — TRAMADOL HCL 50 MG PO TABS: 50.0000 mg | ORAL_TABLET | Freq: Three times a day (TID) | ORAL | 3 refills | Status: AC | PRN

## 2021-06-30 MED ORDER — OZEMPIC (0.25 OR 0.5 MG/DOSE) 2 MG/1.5ML ~~LOC~~ SOPN: 0.5000 mg | PEN_INJECTOR | SUBCUTANEOUS | 3 refills | Status: DC

## 2021-06-30 NOTE — Assessment & Plan Note (Signed)
Much better post-op

## 2021-06-30 NOTE — Assessment & Plan Note (Addendum)
Cont on [place], [place]. Start Ozempic

## 2021-06-30 NOTE — Assessment & Plan Note (Signed)
Tramadol prn ° Potential benefits of a long term opioids use as well as potential risks (i.e. addiction risk, apnea etc) and complications (i.e. Somnolence, constipation and others) were explained to the patient and were aknowledged. ° ° °

## 2021-06-30 NOTE — Assessment & Plan Note (Signed)
Add Ozempic for DM2

## 2021-06-30 NOTE — Progress Notes (Signed)
Subjective:  Patient ID: [name], male    DOB: 1955-09-01  Age: 65 y.o. MRN: [id number]  CC: No chief complaint on file.   HPI [name] presents for CAD, HTN, PAD, DM f/u  Outpatient Medications Prior to Visit  Medication Sig Dispense Refill   aspirin 81 MG chewable tablet Chew 81 mg by mouth daily. Take 1 by mouth daily     b complex vitamins capsule Take 1 capsule by mouth daily.     carvedilol (COREG) 25 MG tablet TAKE 1 TABLET (25 MG TOTAL) BY MOUTH 2 (TWO) TIMES DAILY WITH A MEAL. 180 tablet 3   clopidogrel (PLAVIX) 75 MG tablet Take 1 tablet (75 mg total) by mouth daily at 6 (six) AM. 30 tablet 3   empagliflozin (JARDIANCE) 25 MG TABS tablet Take 25 mg by mouth daily. (Patient taking differently: Take 12.5 mg by mouth daily.) 30 tablet 11   hydrALAZINE (APRESOLINE) 10 MG tablet Take 10 mg by mouth 3 (three) times daily.     isosorbide dinitrate (ISORDIL) 10 MG tablet Take 10 mg by mouth 3 (three) times daily.     metFORMIN (GLUCOPHAGE-XR) 500 MG 24 hr tablet Take 500 mg by mouth in the morning and at bedtime.     pantoprazole (PROTONIX) 40 MG tablet TAKE 1 TABLET (40 MG TOTAL) BY MOUTH DAILY. (Patient taking differently: Take 40 mg by mouth at bedtime.) 90 tablet 0   rosuvastatin (CRESTOR) 20 MG tablet Take 10 mg by mouth daily.     sacubitril-valsartan (ENTRESTO) 97-103 MG Take 1 tablet by mouth 2 (two) times daily.     spironolactone (ALDACTONE) 25 MG tablet Take 25 mg by mouth daily.      oxyCODONE-acetaminophen (PERCOCET/ROXICET) 5-325 MG tablet Take 1 tablet by mouth every 6 (six) hours as needed for moderate pain. 16 tablet 0   linagliptin (TRADJENTA) 5 MG TABS tablet Take 1 tablet (5 mg total) by mouth daily. 90 tablet 3   No facility-administered medications prior to visit.    ROS: Review of Systems  Constitutional:  Positive for fatigue.  Musculoskeletal:  Positive for arthralgias. Negative for gait problem.  Psychiatric/Behavioral:  The  patient is not nervous/anxious.    Objective:  BP 120/80 (BP Location: Left Arm, Patient Position: Sitting, Cuff Size: Large)    Pulse 63    Temp 98.3 F (36.8 C) (Oral)    Ht 6' (1.829 m)    Wt 229 lb (103.9 kg)    SpO2 96%    BMI 31.06 kg/m   BP Readings from Last 3 Encounters:  06/30/21 120/80  06/02/21 95/64  04/30/21 (!) 97/54    Wt Readings from Last 3 Encounters:  06/30/21 229 lb (103.9 kg)  06/02/21 226 lb (102.5 kg)  04/29/21 227 lb (103 kg)    Physical Exam Constitutional:      General: He is not in acute distress.    Appearance: He is well-developed. He is obese.     Comments: NAD  Eyes:     Conjunctiva/sclera: Conjunctivae normal.     Pupils: Pupils are equal, round, and reactive to light.  Neck:     Thyroid: No thyromegaly.     Vascular: No JVD.  Cardiovascular:     Rate and Rhythm: Normal rate and regular rhythm.     Heart sounds: Normal heart sounds. No murmur heard.   No friction rub. No gallop.  Pulmonary:     Effort: Pulmonary effort is normal. No respiratory distress.  Breath sounds: Normal breath sounds. No wheezing or rales.  Chest:     Chest wall: No tenderness.  Abdominal:     General: Bowel sounds are normal. There is no distension.     Palpations: Abdomen is soft. There is no mass.     Tenderness: There is no abdominal tenderness. There is no guarding or rebound.  Musculoskeletal:        General: No tenderness. Normal range of motion.     Cervical back: Normal range of motion.  Lymphadenopathy:     Cervical: No cervical adenopathy.  Skin:    General: Skin is warm and dry.     Findings: No rash.  Neurological:     Mental Status: He is alert and oriented to person, place, and time.     Cranial Nerves: No cranial nerve deficit.     Motor: No abnormal muscle tone.     Coordination: Coordination normal.     Gait: Gait normal.     Deep Tendon Reflexes: Reflexes are normal and symmetric.  Psychiatric:        Behavior: Behavior normal.         Thought Content: Thought content normal.        Judgment: Judgment normal.    Lab Results  Component Value Date   WBC 12.2 (H) 04/30/2021   HGB 13.1 04/30/2021   HCT 38.0 (L) 04/30/2021   PLT 177 04/30/2021   GLUCOSE 185 (H) 04/30/2021   CHOL 228 (H) 04/30/2021   TRIG 93 04/30/2021   HDL 35 (L) 04/30/2021   LDLDIRECT 198.1 02/08/2013   LDLCALC 174 (H) 04/30/2021   ALT 30 04/28/2021   AST 26 04/28/2021   NA 134 (L) 04/30/2021   K 4.2 04/30/2021   CL 103 04/30/2021   CREATININE 1.17 04/30/2021   BUN 15 04/30/2021   CO2 21 (L) 04/30/2021   TSH 1.70 09/24/2015   PSA 0.80 02/08/2013   INR 1.1 04/28/2021   HGBA1C 7.7 (A) 04/16/2021    VAS [place] ABI WITH/WO TBI  Result Date: 06/02/2021  LOWER EXTREMITY DOPPLER STUDY Patient Name:  [name]  Date of Exam:   06/02/2021 Medical Rec #: [id number]       Accession #:    [id number] Date of Birth: [birth year]        Patient Gender: M Patient Age:   60 years Exam Location:  [facility] Procedure:      VAS [place] ABI WITH/WO TBI Referring Phys: Jamelle Haring --------------------------------------------------------------------------------  Indications: Peripheral artery disease. High Risk Factors: Hypertension, hyperlipidemia, Diabetes, past history of                    smoking, coronary artery disease, CHF.  Vascular Interventions: Right Iliofemoral artery endarterectomy with                         profundoplasty , insertion of bilateral common iliac                         artery kissing stents, patch angioplasty of right common                         femoral artery 04/29/2021. Performing Technologist: [name] RVT  Examination Guidelines: A complete evaluation includes at minimum, Doppler waveform signals and systolic blood pressure reading at the level of bilateral brachial, anterior tibial, and posterior tibial  arteries, when vessel segments are accessible. Bilateral testing is considered an integral part of a complete  examination. Photoelectric Plethysmograph (PPG) waveforms and toe systolic pressure readings are included as required and additional duplex testing as needed. Limited examinations for reoccurring indications may be performed as noted.  ABI Findings: +---------+------------------+-----+----------+--------+  Right     Rt Pressure (mmHg) Index Waveform   Comment   +---------+------------------+-----+----------+--------+  Brachial  105                                           +---------+------------------+-----+----------+--------+  PTA       72                 0.67  monophasic brisk     +---------+------------------+-----+----------+--------+  DP        86                 0.80  monophasic brisk     +---------+------------------+-----+----------+--------+  Great Toe 64                 0.60  Abnormal             +---------+------------------+-----+----------+--------+ +---------+------------------+-----+--------+-------+  Left      Lt Pressure (mmHg) Index Waveform Comment  +---------+------------------+-----+--------+-------+  Brachial  107                                        +---------+------------------+-----+--------+-------+  PTA       137                1.28  biphasic          +---------+------------------+-----+--------+-------+  DP        113                1.06  biphasic          +---------+------------------+-----+--------+-------+  Great Toe 76                 0.71  Normal            +---------+------------------+-----+--------+-------+ +-------+-----------+-----------+------------+------------+  ABI/TBI Today's ABI Today's TBI Previous ABI Previous TBI  +-------+-----------+-----------+------------+------------+  Right   0.80        0.60        0.54         0.20          +-------+-----------+-----------+------------+------------+  Left    1.28        0.71        1.00         0.85          +-------+-----------+-----------+------------+------------+  Right ABIs and TBIs appear increased.  Summary: Right:  Resting right ankle-brachial index indicates mild right lower extremity arterial disease. The right toe-brachial index is abnormal. Left: Resting left ankle-brachial index is within normal range. No evidence of significant left lower extremity arterial disease. The left toe-brachial index is normal.  *See table(s) above for measurements and observations.  Electronically signed by Jamelle Haring on 06/02/2021 at 4:38:06 PM.    Final     Assessment & Plan:   Problem List Items Addressed This Visit     CAD (coronary artery disease)    Cont on Coreg , Micardis, Aldactone, ASA.      Claudication ([place])  Much better post-op      Diabetes mellitus type 2 in obese ([place])     Cont on Jardiance, Trajenta. Start Ozempic      Relevant Medications   Semaglutide,0.25 or 0.5MG /DOS, (OZEMPIC, 0.25 OR 0.5 MG/DOSE,) 2 MG/1.5ML SOPN   Other Relevant Orders   Comprehensive metabolic panel   Hemoglobin A1c   Obesity    Add Ozempic for DM2      Relevant Medications   Semaglutide,0.25 or 0.5MG /DOS, (OZEMPIC, 0.25 OR 0.5 MG/DOSE,) 2 MG/1.5ML SOPN   Osteoarthritis    Tramadol prn  Potential benefits of a long term opioids use as well as potential risks (i.e. addiction risk, apnea etc) and complications (i.e. Somnolence, constipation and others) were explained to the patient and were aknowledged.          Meds ordered this encounter  Medications   Semaglutide,0.25 or 0.5MG /DOS, (OZEMPIC, 0.25 OR 0.5 MG/DOSE,) 2 MG/1.5ML SOPN    Sig: Inject 0.5 mg into the skin once a week.    Dispense:  1.5 mL    Refill:  3   traMADol (ULTRAM) 50 MG tablet    Sig: Take 1 tablet (50 mg total) by mouth every 8 (eight) hours as needed for up to 5 days.    Dispense:  90 tablet    Refill:  3      Follow-up: Return in about 3 months (around 09/28/2021) for a follow-up visit.  [name], MD

## 2021-06-30 NOTE — Assessment & Plan Note (Signed)
Cont on Coreg , Micardis, Aldactone, ASA.

## 2021-07-03 ENCOUNTER — Other Ambulatory Visit: Payer: Self-pay

## 2021-07-03 ENCOUNTER — Ambulatory Visit (INDEPENDENT_AMBULATORY_CARE_PROVIDER_SITE_OTHER): Payer: Medicare Other

## 2021-07-03 DIAGNOSIS — Z0000 Encounter for general adult medical examination without abnormal findings: Secondary | ICD-10-CM | POA: Diagnosis not present

## 2021-07-03 DIAGNOSIS — Z789 Other specified health status: Secondary | ICD-10-CM

## 2021-07-03 NOTE — Progress Notes (Addendum)
Subjective:   [name] is a 65 y.o. male who presents for an Initial Medicare Annual Wellness Visit.  I connected with [name] today by telephone and verified that I am speaking with the correct person using two identifiers. Location patient: home Location provider: work Persons participating in the virtual visit: patient, provider.   I discussed the limitations, risks, security and privacy concerns of performing an evaluation and management service by telephone and the availability of in person appointments. I also discussed with the patient that there may be a patient responsible charge related to this service. The patient expressed understanding and verbally consented to this telephonic visit.    Interactive audio and video telecommunications were attempted between this provider and patient, however failed, due to patient having technical difficulties OR patient did not have access to video capability.  We continued and completed visit with audio only.    Review of Systems     Cardiac Risk Factors include: advanced age (>20men, >58 women);diabetes mellitus;male gender;hypertension     Objective:    Today's Vitals   There is no height or weight on file to calculate BMI.  Advanced Directives 07/03/2021 04/29/2021 04/28/2021 05/22/2018 02/02/2018 11/29/2017 11/16/2016  Does Patient Have a Medical Advance Directive? Yes Yes Yes No Yes No No  Type of Paramedic of [place];Living will [place];Living will [place];Living will - Living will - -  Does patient want to make changes to medical advance directive? - No - Patient declined No - Patient declined - - - -  Copy of [place] in Chart? No - copy requested No - copy requested No - copy requested - - - -  Would patient like information on creating a medical advance directive? - - - No - Patient declined - No - Patient declined -    Current  Medications (verified) Outpatient Encounter Medications as of 07/03/2021  Medication Sig   aspirin 81 MG chewable tablet Chew 81 mg by mouth daily. Take 1 by mouth daily   b complex vitamins capsule Take 1 capsule by mouth daily.   carvedilol (COREG) 25 MG tablet TAKE 1 TABLET (25 MG TOTAL) BY MOUTH 2 (TWO) TIMES DAILY WITH A MEAL.   clopidogrel (PLAVIX) 75 MG tablet Take 1 tablet (75 mg total) by mouth daily at 6 (six) AM.   empagliflozin (JARDIANCE) 25 MG TABS tablet Take 25 mg by mouth daily. (Patient taking differently: Take 12.5 mg by mouth daily.)   hydrALAZINE (APRESOLINE) 10 MG tablet Take 10 mg by mouth 3 (three) times daily.   isosorbide dinitrate (ISORDIL) 10 MG tablet Take 10 mg by mouth 3 (three) times daily.   linagliptin (TRADJENTA) 5 MG TABS tablet Take 1 tablet (5 mg total) by mouth daily.   metFORMIN (GLUCOPHAGE-XR) 500 MG 24 hr tablet Take 500 mg by mouth in the morning and at bedtime.   pantoprazole (PROTONIX) 40 MG tablet TAKE 1 TABLET (40 MG TOTAL) BY MOUTH DAILY. (Patient taking differently: Take 40 mg by mouth at bedtime.)   rosuvastatin (CRESTOR) 20 MG tablet Take 10 mg by mouth daily.   sacubitril-valsartan (ENTRESTO) 97-103 MG Take 1 tablet by mouth 2 (two) times daily.   Semaglutide,0.25 or 0.5MG /DOS, (OZEMPIC, 0.25 OR 0.5 MG/DOSE,) 2 MG/1.5ML SOPN Inject 0.5 mg into the skin once a week.   spironolactone (ALDACTONE) 25 MG tablet Take 25 mg by mouth daily.    traMADol (ULTRAM) 50 MG tablet Take  1 tablet (50 mg total) by mouth every 8 (eight) hours as needed for up to 5 days.   No facility-administered encounter medications on file as of 07/03/2021.    Allergies (verified) Patient has no known allergies.   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 Erskin Burnet   Sleep disorder    Dr Gwenette Greet, lose weight and consider sleep study later   TIA (transient ischemic attack)    by history   Volume overload    Past Surgical History:  Procedure Laterality Date   CHOLECYSTECTOMY     COLONOSCOPY  03/19/2009   CORONARY ARTERY BYPASS GRAFT     ENDARTERECTOMY Right 11/05/2016   Procedure: ENDARTERECTOMY RIGHT CAROTID;  Surgeon: [name], MD;  Location: [place];  Service: Vascular;  Laterality: Right;   ENDARTERECTOMY FEMORAL Right 04/29/2021   Procedure: RIGHT ILIOFEMORAL ARTERY ENDARTERECTOMY WITH PROFUNDOPLASTY;  Surgeon: Cherre Robins, MD;  Location: [place];  Service: Vascular;  Laterality: Right;   INSERTION OF ILIAC STENT Right 04/29/2021   Procedure: Maxcine Ham INSERTION OF BILATERAL COMMON ILIAC ARTERY KISSING STENTS;  Surgeon: Cherre Robins, MD;  Location: [place];  Service: Vascular;  Laterality: Right;   PATCH ANGIOPLASTY Right 11/05/2016   Procedure: RIGHT CAROTID ARTERY PATCH ANGIOPLASTY USING [place];  Surgeon: [name], MD;  Location: [place];  Service: Vascular;  Laterality: Right;   PATCH ANGIOPLASTY Right 04/29/2021   Procedure: PATCH ANGIOPLASTY OF RIGHT COMMON FEMORAL ARTERY UISNG XENOSURE BOVINE PERICARDIUM [place];  Surgeon: Cherre Robins, MD;  Location: [place];  Service: Vascular;  Laterality: Right;   SHOULDER ARTHROSCOPY Right 05/25/2018   Procedure: RIGHT ARTHROSCOPY  SHOULDER LABRAL DEBRIDEMENT, rotator cuff debridement,  BICEPS TENOTOMY and subacromial decompression;  Surgeon: Tania Ade, MD;  Location: [place];  Service: Orthopedics;  Laterality: Right;   ULTRASOUND GUIDANCE FOR VASCULAR ACCESS Left 04/29/2021   Procedure: ULTRASOUND GUIDANCE FOR VASCULAR ACCESS, LEFT FEMORAL ARTERY;  Surgeon: Cherre Robins, MD;  [facility];  Service: Vascular;  Laterality: Left;   Family History  Problem Relation Age of Onset   Stroke Mother    Diabetes Father    COPD Father    Arthritis Father    Heart disease Father    Coronary artery disease Unknown        Male 1st degree relative <50   Diabetes Unknown        1st degree relative   Stroke Unknown        Male 1st degree relative<50   Social History  Socioeconomic History   Marital status: Married    Spouse name: Not on file   Number of children: Not on file   Years of education: Not on file   Highest education level: Not on file  Occupational History   Not on file  Tobacco Use   Smoking status: Former    Types: Cigarettes    Quit date: 05/05/2002    Years since quitting: 19.1   Smokeless tobacco: Never  Vaping Use   Vaping Use: Never used  Substance and Sexual Activity   Alcohol use: Yes    Alcohol/week: 0.0 standard drinks    Comment: beer every now and then   Drug use: No   Sexual activity: Yes  Other Topics Concern   Not on file  Social History Narrative   No Regular exercise   Social Determinants of Health   Financial Resource Strain: Low Risk    Difficulty of Paying Living Expenses: Not hard at all  Food Insecurity: No Food Insecurity   Worried About Charity fundraiser in the Last Year: Never true   [place] in the Last Year: Never true  Transportation Needs: No Transportation Needs   Lack of Transportation (Medical): No   Lack of Transportation (Non-Medical): No  Physical Activity: Insufficiently Active   Days of Exercise per Week: 2 days   Minutes of  Exercise per Session: 30 min  Stress: No Stress Concern Present   Feeling of Stress : Not at all  Social Connections: Socially Integrated   Frequency of Communication with Friends and Family: Twice a week   Frequency of Social Gatherings with Friends and Family: Twice a week   Attends Religious Services: More than 4 times per year   Active Member of [employer] or Organizations: Yes   Attends Music therapist: More than 4 times per year   Marital Status: Married    Tobacco Counseling Counseling given: Not Answered   Clinical Intake:  Pre-visit preparation completed: Yes  Pain : No/denies pain     Nutritional Risks: None Diabetes: No  How often do you need to have someone help you when you read instructions, pamphlets, or other written materials from your doctor or pharmacy?: 1 - Never What is the last grade level you completed in school?: College  Diabetic?yes Nutrition Risk Assessment:  Has the patient had any N/V/D within the last 2 months?  No  Does the patient have any non-healing wounds?  No  Has the patient had any unintentional weight loss or weight gain?  No   Diabetes:  Is the patient diabetic?  Yes  If diabetic, was a CBG obtained today?  No  Did the patient bring in their glucometer from home?  No  How often do you monitor your CBG's? never.   Financial Strains and Diabetes Management:  Are you having any financial strains with the device, your supplies or your medication? No .  Does the patient want to be seen by Chronic Care Management for management of their diabetes?  No  Would the patient like to be referred to a Nutritionist or for Diabetic Management?  No   Diabetic Exams:  Diabetic Eye Exam: Completed 09/2020 Diabetic Foot Exam: Overdue, Pt has been advised about the importance in completing this exam. Pt is scheduled for diabetic foot exam on next office visit .   Interpreter Needed?: No  Information entered by ::  L.Jhana Giarratano,LPN   Activities of Daily Living In your present state of  health, do you have any difficulty performing the following activities: 07/03/2021 04/29/2021  Hearing? N Y  [place]? N N  Difficulty concentrating or making decisions? N N  Walking or climbing stairs? N N  Dressing or bathing? N N  Doing errands, shopping? N N  Preparing Food and eating ? N -  Using the Toilet? N -  In the past six months, have you accidently leaked urine? N -  Do you have problems with loss of bowel control? N -  Managing your Medications? N -  Managing your Finances? N -  Housekeeping or managing your Housekeeping? N -  Some recent data might be hidden    Patient Care Team: Plotnikov, Evie Lacks, MD as PCP - General Nahser, Wonda Cheng, MD as PCP - Cardiology (Cardiology) Renato Shin, MD as Consulting Physician (Endocrinology)  Indicate any recent Medical Services you may have received from other than [facility] providers in the past year (date may be approximate).     Assessment:   This is a routine wellness examination for [name].  Hearing/Vision screen Vision Screening - Comments:: Annual eye exams   Dietary issues and exercise activities discussed: Current Exercise Habits: The patient does not participate in regular exercise at present, Exercise limited by: None identified   Goals Addressed   None    Depression Screen PHQ 2/9 Scores 07/03/2021 07/03/2021 03/03/2021 08/17/2017  PHQ - 2 Score 0 0 0 0  PHQ- 9 Score - - 0 -    Fall Risk Fall Risk  07/03/2021 03/03/2021  Falls in the past year? 0 0  Number falls in past yr: 0 0  Injury with Fall? 0 0  Risk for fall due to : - No Fall Risks  Follow up Falls evaluation completed -    FALL RISK PREVENTION PERTAINING TO THE HOME:  Any stairs in or around the home? No  If so, are there any without handrails? No  Home free of loose throw rugs in walkways, pet beds, electrical cords, etc? Yes  Adequate lighting in your home to  reduce risk of falls? Yes   ASSISTIVE DEVICES UTILIZED TO PREVENT FALLS:  Life alert? No  Use of a cane, walker or w/c? No  Grab bars in the bathroom? Yes  Shower chair or bench in shower? Yes  Elevated toilet seat or a handicapped toilet? No     Cognitive Function:  Normal cognitive status assessed by direct observation by this Nurse Health Advisor. No abnormalities found.        Immunizations Immunization History  Administered Date(s) Administered   Influenza Split 06/10/2018   Influenza Whole 03/23/2010, 04/14/2012   Influenza, High Dose Seasonal PF 03/05/2021   Influenza,inj,Quad PF,6+ Mos 05/04/2013, 04/04/2015, 06/17/2018, 02/23/2019, 02/23/2019   Influenza-Unspecified 05/12/2016, 04/20/2017, 02/04/2019   PFIZER(Purple Top)SARS-COV-2 Vaccination 08/30/2019, 09/25/2019, 04/24/2020   Pneumococcal Conjugate-13 08/13/2013   Pneumococcal Polysaccharide-23 02/09/2013, 07/17/2019   Td 11/27/2008   Tdap 07/05/2014   Zoster Recombinat (Shingrix) 03/10/2017, 08/17/2017    TDAP status: Up to date  Flu Vaccine status: Up to date  Pneumococcal vaccine status: Up to date  Covid-19 vaccine status: Completed vaccines  Qualifies for Shingles Vaccine? Yes   Zostavax completed Yes   Shingrix Completed?: Yes  Screening Tests Health Maintenance  Topic Date Due   OPHTHALMOLOGY EXAM  Never done   Hepatitis C Screening  Never done   COLONOSCOPY (Pts 45-59yrs Insurance coverage will need to be confirmed)  03/20/2019   COVID-19 Vaccine (4 -  Booster for [employer] series) 06/19/2020   HEMOGLOBIN A1C  10/15/2021   FOOT EXAM  04/16/2022   TETANUS/TDAP  07/05/2024   Pneumonia Vaccine 76+ Years old (4 - PPSV23 if available, else PCV20) 07/16/2024   INFLUENZA VACCINE  Completed   Zoster Vaccines- Shingrix  Completed   HPV VACCINES  Aged Out    Health Maintenance  Health Maintenance Due  Topic Date Due   OPHTHALMOLOGY EXAM  Never done   Hepatitis C Screening  Never done    COLONOSCOPY (Pts 45-73yrs Insurance coverage will need to be confirmed)  03/20/2019   COVID-19 Vaccine (4 - Booster for [place] series) 06/19/2020    Colorectal cancer screening: Referral to GI placed scheduled 09/16/2020. Pt aware the office will call re: appt.  Lung Cancer Screening: (Low Dose CT Chest recommended if Age 17-80 years, 30 pack-year currently smoking OR have quit w/in 15years.) does not qualify.   Lung Cancer Screening Referral: n/a  Additional Screening:  Hepatitis C Screening: does qualify;   Vision Screening: Recommended annual ophthalmology exams for early detection of glaucoma and other disorders of the eye. Is the patient up to date with their annual eye exam?  Yes  Who is the provider or what is the name of the office in which the patient attends annual eye exams? [facility]  If pt is not established with a provider, would they like to be referred to a provider to establish care? No .   Dental Screening: Recommended annual dental exams for proper oral hygiene  Community Resource Referral / Chronic Care Management: CRR required this visit?  No   CCM required this visit?  No      Plan:     I have personally reviewed and noted the following in the patients chart:   Medical and social history Use of alcohol, tobacco or illicit drugs  Current medications and supplements including opioid prescriptions. Patient is currently taking opioid prescriptions. Information provided to patient regarding non-opioid alternatives. Patient advised to discuss non-opioid treatment plan with their provider. Functional ability and status Nutritional status Physical activity Advanced directives List of other physicians Hospitalizations, surgeries, and ER visits in previous 12 months Vitals Screenings to include cognitive, depression, and falls Referrals and appointments  In addition, I have reviewed and discussed with patient certain preventive protocols, quality  metrics, and best practice recommendations. A written personalized care plan for preventive services as well as general preventive health recommendations were provided to patient.     Randel Pigg, LPN   [id number]   Nurse Notes: none    Medical screening examination/treatment/procedure(s) were performed by non-physician practitioner and as supervising physician I was immediately available for consultation/collaboration.  I agree with above. Lew Dawes, MD

## 2021-07-03 NOTE — Patient Instructions (Signed)
[name] , Thank you for taking time to come for your Medicare Wellness Visit. I appreciate your ongoing commitment to your health goals. Please review the following plan we discussed and let me know if I can assist you in the future.   Screening recommendations/referrals: Colonoscopy: scheduled 09/16/2021 Recommended yearly ophthalmology/optometry visit for glaucoma screening and checkup Recommended yearly dental visit for hygiene and checkup  Vaccinations: Influenza vaccine: completed  Pneumococcal vaccine: completed  Tdap vaccine: 07/05/2014 Shingles vaccine: completed     Advanced directives: will provide copies   Conditions/risks identified: none   Next appointment: none   Preventive Care 66 Years and Older, Male Preventive care refers to lifestyle choices and visits with your health care provider that can promote health and wellness. What does preventive care include? A yearly physical exam. This is also called an annual well check. Dental exams once or twice a year. Routine eye exams. Ask your health care provider how often you should have your eyes checked. Personal lifestyle choices, including: Daily care of your teeth and gums. Regular physical activity. Eating a healthy diet. Avoiding tobacco and drug use. Limiting alcohol use. Practicing safe sex. Taking low doses of aspirin every day. Taking vitamin and mineral supplements as recommended by your health care provider. What happens during an annual well check? The services and screenings done by your health care provider during your annual well check will depend on your age, overall health, lifestyle risk factors, and family history of disease. Counseling  Your health care provider may ask you questions about your: Alcohol use. Tobacco use. Drug use. Emotional well-being. Home and relationship well-being. Sexual activity. Eating habits. History of falls. Memory and ability to understand (cognition). Work  and work Statistician. Screening  You may have the following tests or measurements: Height, weight, and BMI. Blood pressure. Lipid and cholesterol levels. These may be checked every 5 years, or more frequently if you are over 66 years old. Skin check. Lung cancer screening. You may have this screening every year starting at age 66 if you have a 30-pack-year history of smoking and currently smoke or have quit within the past 15 years. Fecal occult blood test (FOBT) of the stool. You may have this test every year starting at age 66. Flexible sigmoidoscopy or colonoscopy. You may have a sigmoidoscopy every 5 years or a colonoscopy every 10 years starting at age 66. Prostate cancer screening. Recommendations will vary depending on your family history and other risks. Hepatitis C blood test. Hepatitis B blood test. Sexually transmitted disease (STD) testing. Diabetes screening. This is done by checking your blood sugar (glucose) after you have not eaten for a while (fasting). You may have this done every 1-3 years. Abdominal aortic aneurysm (AAA) screening. You may need this if you are a current or former smoker. Osteoporosis. You may be screened starting at age 61 if you are at high risk. Talk with your health care provider about your test results, treatment options, and if necessary, the need for more tests. Vaccines  Your health care provider may recommend certain vaccines, such as: Influenza vaccine. This is recommended every year. Tetanus, diphtheria, and acellular pertussis (Tdap, Td) vaccine. You may need a Td booster every 10 years. Zoster vaccine. You may need this after age 66. Pneumococcal 13-valent conjugate (PCV13) vaccine. One dose is recommended after age 66. Pneumococcal polysaccharide (PPSV23) vaccine. One dose is recommended after age 19. Talk to your health care provider about which screenings and vaccines you need and how  often you need them. This information is not intended  to replace advice given to you by your health care provider. Make sure you discuss any questions you have with your health care provider. Document Released: 07/18/2015 Document Revised: 03/10/2016 Document Reviewed: 04/22/2015 Elsevier Interactive Patient Education  2017 [place] Prevention in the Home Falls can cause injuries. They can happen to people of all ages. There are many things you can do to make your home safe and to help prevent falls. What can I do on the outside of my home? Regularly fix the edges of walkways and driveways and fix any cracks. Remove anything that might make you trip as you walk through a door, such as a raised step or threshold. Trim any bushes or trees on the path to your home. Use bright outdoor lighting. Clear any walking paths of anything that might make someone trip, such as rocks or tools. Regularly check to see if handrails are loose or broken. Make sure that both sides of any steps have handrails. Any raised decks and porches should have guardrails on the edges. Have any leaves, snow, or ice cleared regularly. Use sand or salt on walking paths during winter. Clean up any spills in your garage right away. This includes oil or grease spills. What can I do in the bathroom? Use night lights. Install grab bars by the toilet and in the tub and shower. Do not use towel bars as grab bars. Use non-skid mats or decals in the tub or shower. If you need to sit down in the shower, use a plastic, non-slip stool. Keep the floor dry. Clean up any water that spills on the floor as soon as it happens. Remove soap buildup in the tub or shower regularly. Attach bath mats securely with double-sided non-slip rug tape. Do not have throw rugs and other things on the floor that can make you trip. What can I do in the bedroom? Use night lights. Make sure that you have a light by your bed that is easy to reach. Do not use any sheets or blankets that are too big  for your bed. They should not hang down onto the floor. Have a firm chair that has side arms. You can use this for support while you get dressed. Do not have throw rugs and other things on the floor that can make you trip. What can I do in the kitchen? Clean up any spills right away. Avoid walking on wet floors. Keep items that you use a lot in easy-to-reach places. If you need to reach something above you, use a strong step stool that has a grab bar. Keep electrical cords out of the way. Do not use floor polish or wax that makes floors slippery. If you must use wax, use non-skid floor wax. Do not have throw rugs and other things on the floor that can make you trip. What can I do with my stairs? Do not leave any items on the stairs. Make sure that there are handrails on both sides of the stairs and use them. Fix handrails that are broken or loose. Make sure that handrails are as long as the stairways. Check any carpeting to make sure that it is firmly attached to the stairs. Fix any carpet that is loose or worn. Avoid having throw rugs at the top or bottom of the stairs. If you do have throw rugs, attach them to the floor with carpet tape. Make sure that you have a  light switch at the top of the stairs and the bottom of the stairs. If you do not have them, ask someone to add them for you. What else can I do to help prevent falls? Wear shoes that: Do not have high heels. Have rubber bottoms. Are comfortable and fit you well. Are closed at the toe. Do not wear sandals. If you use a stepladder: Make sure that it is fully opened. Do not climb a closed stepladder. Make sure that both sides of the stepladder are locked into place. Ask someone to hold it for you, if possible. Clearly mark and make sure that you can see: Any grab bars or handrails. First and last steps. Where the edge of each step is. Use tools that help you move around (mobility aids) if they are needed. These  include: Canes. Walkers. Scooters. Crutches. Turn on the lights when you go into a dark area. Replace any light bulbs as soon as they burn out. Set up your furniture so you have a clear path. Avoid moving your furniture around. If any of your floors are uneven, fix them. If there are any pets around you, be aware of where they are. Review your medicines with your doctor. Some medicines can make you feel dizzy. This can increase your chance of falling. Ask your doctor what other things that you can do to help prevent falls. This information is not intended to replace advice given to you by your health care provider. Make sure you discuss any questions you have with your health care provider. Document Released: 04/17/2009 Document Revised: 11/27/2015 Document Reviewed: 07/26/2014 Elsevier Interactive Patient Education  2017 [employer].

## 2021-07-15 ENCOUNTER — Telehealth: Payer: Self-pay | Admitting: Lab

## 2021-07-15 NOTE — Progress Notes (Signed)
°  Chronic Care Management   Note  07/15/2021 Name: [name] MRN: [id number] DOB: 1956/01/13  [name] is a 66 y.o. year old male who is a primary care patient of Plotnikov, Evie Lacks, MD. I reached out to [name] by phone today in response to a referral sent by [name] PCP, Plotnikov, Evie Lacks, MD.   [name] was given information about Chronic Care Management services today including:  CCM service includes personalized support from designated clinical staff supervised by his physician, including individualized plan of care and coordination with other care providers 24/7 contact phone numbers for assistance for urgent and routine care needs. Service will only be billed when office clinical staff spend 20 minutes or more in a month to coordinate care. Only one practitioner may furnish and bill the service in a calendar month. The patient may stop CCM services at any time (effective at the end of the month) by phone call to the office staff.   Patient did not agree to enrollment in care management services and does not wish to consider at this time.  Follow up plan: [place]

## 2021-07-23 ENCOUNTER — Ambulatory Visit (INDEPENDENT_AMBULATORY_CARE_PROVIDER_SITE_OTHER): Payer: Medicare Other | Admitting: Endocrinology

## 2021-07-23 ENCOUNTER — Other Ambulatory Visit: Payer: Self-pay

## 2021-07-23 VITALS — BP 124/74 | HR 64 | Ht 72.0 in | Wt 226.4 lb

## 2021-07-23 DIAGNOSIS — E1165 Type 2 diabetes mellitus with hyperglycemia: Secondary | ICD-10-CM

## 2021-07-23 DIAGNOSIS — E669 Obesity, unspecified: Secondary | ICD-10-CM

## 2021-07-23 DIAGNOSIS — E1169 Type 2 diabetes mellitus with other specified complication: Secondary | ICD-10-CM | POA: Diagnosis not present

## 2021-07-23 LAB — POCT GLYCOSYLATED HEMOGLOBIN (HGB A1C): Hemoglobin A1C: 6.3 % — AB (ref 4.0–5.6)

## 2021-07-23 NOTE — Progress Notes (Signed)
Subjective:    Patient ID: [name], male    DOB: [birth year], 66 y.o.   MRN: [id number]  HPI Pt returns for f/u of diabetes mellitus: DM type: 2 Dx'ed: 7858 Complications: CVA and CAD Therapy: 3 oral meds.  DKA: never Severe hypoglycemia: never Pancreatitis: never Other: he has never been on insulin.   Interval history: pt states cbg's vary from 75-277, but most are in the 100's.  pt states he feels well in general.  He has not yet started Ozempic.   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 Erskin Burnet   Sleep disorder    Dr Gwenette Greet, lose weight and consider sleep study later   TIA (transient ischemic attack)    by history   Volume overload     Past Surgical History:  Procedure Laterality Date   CHOLECYSTECTOMY     COLONOSCOPY  03/19/2009   CORONARY ARTERY BYPASS GRAFT     ENDARTERECTOMY Right 11/05/2016   Procedure: ENDARTERECTOMY RIGHT CAROTID;  Surgeon: [name], MD;  Location: [place];  Service: Vascular;  Laterality: Right;   ENDARTERECTOMY FEMORAL Right 04/29/2021   Procedure: RIGHT ILIOFEMORAL ARTERY ENDARTERECTOMY WITH PROFUNDOPLASTY;  Surgeon: Cherre Robins, MD;  Location: [place];  Service: Vascular;  Laterality: Right;   INSERTION  OF ILIAC STENT Right 04/29/2021   Procedure: Maxcine Ham INSERTION OF BILATERAL COMMON ILIAC ARTERY KISSING STENTS;  Surgeon: Cherre Robins, MD;  Location: [place];  Service: Vascular;  Laterality: Right;   PATCH ANGIOPLASTY Right 11/05/2016   Procedure: RIGHT CAROTID ARTERY PATCH ANGIOPLASTY USING [place];  Surgeon: [name], MD;  Location: [place];  Service: Vascular;  Laterality: Right;   PATCH ANGIOPLASTY Right 04/29/2021   Procedure: PATCH ANGIOPLASTY OF RIGHT COMMON FEMORAL ARTERY UISNG XENOSURE BOVINE PERICARDIUM [place];  Surgeon: Cherre Robins, MD;  Location: [place];  Service: Vascular;  Laterality: Right;   SHOULDER ARTHROSCOPY Right 05/25/2018   Procedure: RIGHT ARTHROSCOPY SHOULDER LABRAL DEBRIDEMENT, rotator cuff debridement,  BICEPS TENOTOMY and subacromial decompression;  Surgeon: Tania Ade, MD;  Location: [place];  Service: Orthopedics;  Laterality: Right;   ULTRASOUND GUIDANCE FOR VASCULAR ACCESS Left 04/29/2021   Procedure: ULTRASOUND GUIDANCE FOR VASCULAR ACCESS, LEFT FEMORAL ARTERY;  Surgeon: Cherre Robins, MD;  Location[facility] OR;  Service: Vascular;  Laterality: Left;    Social History   Socioeconomic History   Marital status: Married    Spouse name: Not on file   Number of children: Not on file   Years of education: Not on  file   Highest education level: Not on file  Occupational History   Not on file  Tobacco Use   Smoking status: Former    Types: Cigarettes    Quit date: 05/05/2002    Years since quitting: 19.2   Smokeless tobacco: Never  Vaping Use   Vaping Use: Never used  Substance and Sexual Activity   Alcohol use: Yes    Alcohol/week: 0.0 standard drinks    Comment: beer every now and then   Drug use: No   Sexual activity: Yes  Other Topics Concern   Not on file  Social History Narrative   No Regular exercise   Social Determinants of Health   Financial Resource Strain: Low Risk    Difficulty of Paying Living  Expenses: Not hard at all  Food Insecurity: No Food Insecurity   Worried About Charity fundraiser in the Last Year: Never true   [place] in the Last Year: Never true  Transportation Needs: No Transportation Needs   Lack of Transportation (Medical): No   Lack of Transportation (Non-Medical): No  Physical Activity: Insufficiently Active   Days of Exercise per Week: 2 days   Minutes of Exercise per Session: 30 min  Stress: No Stress Concern Present   Feeling of Stress : Not at all  Social Connections: Socially Integrated   Frequency of Communication with Friends and Family: Twice a week   Frequency of Social Gatherings with Friends and Family: Twice a week   Attends Religious Services: More than 4 times per year   Active Member of [employer] or Organizations: Yes   Attends Music therapist: More than 4 times per year   Marital Status: Married  Human resources officer Violence: Not At Risk   Fear of Current or Ex-Partner: No   Emotionally Abused: No   Physically Abused: No   Sexually Abused: No    Current Outpatient Medications on File Prior to Visit  Medication Sig Dispense Refill   aspirin 81 MG chewable tablet Chew 81 mg by mouth daily. Take 1 by mouth daily     b complex vitamins capsule Take 1 capsule by mouth daily.     carvedilol (COREG) 25 MG tablet TAKE 1 TABLET (25 MG TOTAL) BY MOUTH 2 (TWO) TIMES DAILY WITH A MEAL. 180 tablet 3   clopidogrel (PLAVIX) 75 MG tablet Take 1 tablet (75 mg total) by mouth daily at 6 (six) AM. 30 tablet 3   empagliflozin (JARDIANCE) 25 MG TABS tablet Take 25 mg by mouth daily. (Patient taking differently: Take 12.5 mg by mouth daily.) 30 tablet 11   hydrALAZINE (APRESOLINE) 10 MG tablet Take 10 mg by mouth 3 (three) times daily.     isosorbide dinitrate (ISORDIL) 10 MG tablet Take 10 mg by mouth 3 (three) times daily.     linagliptin (TRADJENTA) 5 MG TABS tablet Take 1 tablet (5 mg total) by mouth daily. 90 tablet 3   metFORMIN  (GLUCOPHAGE-XR) 500 MG 24 hr tablet Take 500 mg by mouth in the morning and at bedtime.     pantoprazole (PROTONIX) 40 MG tablet TAKE 1 TABLET (40 MG TOTAL) BY MOUTH DAILY. (Patient taking differently: Take 40 mg by mouth at bedtime.) 90 tablet 0   rosuvastatin (CRESTOR) 20 MG tablet Take 10 mg by mouth daily.     sacubitril-valsartan (ENTRESTO) 97-103 MG Take 1 tablet by mouth 2 (two) times daily.     Semaglutide,0.25 or 0.5MG /DOS, (OZEMPIC, 0.25 OR 0.5 MG/DOSE,)  2 MG/1.5ML SOPN Inject 0.5 mg into the skin once a week. 1.5 mL 3   spironolactone (ALDACTONE) 25 MG tablet Take 25 mg by mouth daily.      No current facility-administered medications on file prior to visit.    No Known Allergies  Family History  Problem Relation Age of Onset   Stroke Mother    Diabetes Father    COPD Father    Arthritis Father    Heart disease Father    Coronary artery disease Unknown        Male 1st degree relative <50   Diabetes Unknown        1st degree relative   Stroke Unknown        Male 1st degree relative<50    BP 124/74    Pulse 64    Ht 6' (1.829 m)    Wt 226 lb 6.4 oz (102.7 kg)    SpO2 97%    BMI 30.71 kg/m    Review of Systems     Objective:   Physical Exam    Lab Results  Component Value Date   HGBA1C 6.3 (A) 07/23/2021      Assessment & Plan:  Type 2 DM: well-controlled  Patient Instructions  check your blood sugar once a day.  vary the time of day when you check, between before the 3 meals, and at bedtime.  also check if you have symptoms of your blood sugar being too high or too low.  please keep a record of the readings and bring it to your next appointment here (or you can bring the meter itself).  You can write it on any piece of paper.  please call us sooner if your blood sugar goes below 70, or if you have a lot of readings over 200.   If you are able to obtain the Ozempic, this can replace the Tradjenta.   Please continue the same 3 diabetes medications.   Please  come back for a follow-up appointment in 4 months.

## 2021-07-23 NOTE — Patient Instructions (Addendum)
check your blood sugar once a day.  vary the time of day when you check, between before the 3 meals, and at bedtime.  also check if you have symptoms of your blood sugar being too high or too low.  please keep a record of the readings and bring it to your next appointment here (or you can bring the meter itself).  You can write it on any piece of paper.  please call us sooner if your blood sugar goes below 70, or if you have a lot of readings over 200.   If you are able to obtain the Ozempic, this can replace the Tradjenta.   Please continue the same 3 diabetes medications.   Please come back for a follow-up appointment in 4 months.

## 2021-07-30 ENCOUNTER — Encounter: Payer: Self-pay | Admitting: Vascular Surgery

## 2021-08-31 NOTE — Progress Notes (Signed)
[facility] [place] PROGRESS NOTE  ASSESSMENT / PLAN: [name] is a 66 y.o. male status post bilateral common iliac "kissing" angioplasty and stenting (RCIA 11x65mm VBX, LCIA 10x66mm VBX) and right iliofemoral endarterectomy and profundaplasty with bovine pericardial patch angioplasty 04/29/21 for rest pain. He has relief of rest pain.  He does report some long distance claudication symptoms, which I counseled him that we should monitor.  His noninvasive testing today is reassuring.  Follow up with me in 6 months with ABI for surveillance.  SUBJECTIVE: Walking without difficulty. No rest pain.  He does note some claudication when walking a significant distance.  He noticed this at a wedding the past weekend.  Otherwise he is doing quite well.  He would like to return to heavy lifting.  OBJECTIVE: BP 95/61 (BP Location: Left Arm, Patient Position: Sitting, Cuff Size: Large)    Pulse 60    Temp 98 F (36.7 C) (Temporal)    Resp 20    Ht 6' (1.829 m)    Wt 223 lb (101.2 kg)    SpO2 97%    BMI 30.24 kg/m   Constitutional: well appearing. no acute distress. Cardiac: 5/5 throughout. Normal gait and station. Pulmonary: unlabored Abdomen: soft. Vascular: right groin completely healed. Well perfused feet bilaterally.  CBC Latest Ref Rng & Units 04/30/2021 04/28/2021 05/22/2018  WBC 4.0 - 10.5 K/uL 12.2(H) 5.2 7.9  Hemoglobin 13.0 - 17.0 g/dL 13.1 15.2 15.1  Hematocrit 39.0 - 52.0 % 38.0(L) 45.1 45.9  Platelets 150 - 400 K/uL 177 188 192     CMP Latest Ref Rng & Units 04/30/2021 04/28/2021 03/31/2021  Glucose 70 - 99 mg/dL 185(H) 178(H) 111(H)  BUN 8 - 23 mg/dL 15 13 19   Creatinine 0.61 - 1.24 mg/dL 1.17 1.14 1.16  Sodium 135 - 145 mmol/L 134(L) 133(L) 138  Potassium 3.5 - 5.1 mmol/L 4.2 4.1 4.9  Chloride 98 - 111 mmol/L 103 103 102  CO2 22 - 32 mmol/L 21(L) 18(L) 27  Calcium 8.9 - 10.3 mg/dL 9.0 9.2 10.0  Total Protein 6.5 - 8.1 g/dL - 7.0 7.8  Total Bilirubin  0.3 - 1.2 mg/dL - 1.0 0.7  Alkaline Phos 38 - 126 U/L - 39 48  AST 15 - 41 U/L - 26 19  ALT 0 - 44 U/L - 30 21    ABI  +-------+-----------+-----------+------------+------------+   ABI/TBI Today's ABI Today's TBI Previous ABI Previous TBI   +-------+-----------+-----------+------------+------------+   Right   0.86        0.67        0.80         0.60           +-------+-----------+-----------+------------+------------+   Left    0.88        0.72        1.28         0.71           +-------+-----------+-----------+------------+------------+   Yevonne Aline. Stanford Breed, MD [facility] [address] [facility] Phone Number: [phone] 09/01/2021 10:02 AM

## 2021-09-01 ENCOUNTER — Ambulatory Visit (HOSPITAL_COMMUNITY)
Admission: RE | Admit: 2021-09-01 | Discharge: 2021-09-01 | Disposition: A | Discharge status: Home / Self Care | Payer: Medicare Other | Source: Ambulatory Visit | Attending: Vascular Surgery | Admitting: Vascular Surgery

## 2021-09-01 ENCOUNTER — Other Ambulatory Visit: Payer: Self-pay

## 2021-09-01 ENCOUNTER — Encounter: Payer: Self-pay | Admitting: Vascular Surgery

## 2021-09-01 ENCOUNTER — Ambulatory Visit (INDEPENDENT_AMBULATORY_CARE_PROVIDER_SITE_OTHER): Payer: Medicare Other | Admitting: Vascular Surgery

## 2021-09-01 VITALS — BP 95/61 | HR 60 | Temp 98.0°F | Resp 20 | Ht 72.0 in | Wt 223.0 lb

## 2021-09-01 DIAGNOSIS — I739 Peripheral vascular disease, unspecified: Secondary | ICD-10-CM | POA: Diagnosis present

## 2021-09-04 ENCOUNTER — Other Ambulatory Visit: Payer: Self-pay | Admitting: *Deleted

## 2021-09-04 DIAGNOSIS — I739 Peripheral vascular disease, unspecified: Secondary | ICD-10-CM

## 2021-09-04 DIAGNOSIS — I7409 Other arterial embolism and thrombosis of abdominal aorta: Secondary | ICD-10-CM

## 2021-09-29 ENCOUNTER — Ambulatory Visit (INDEPENDENT_AMBULATORY_CARE_PROVIDER_SITE_OTHER): Payer: Medicare Other | Admitting: Internal Medicine

## 2021-09-29 ENCOUNTER — Encounter: Payer: Self-pay | Admitting: Internal Medicine

## 2021-09-29 VITALS — BP 118/68 | HR 61 | Temp 98.3°F | Ht 72.0 in | Wt 223.0 lb

## 2021-09-29 DIAGNOSIS — M79609 Pain in unspecified limb: Secondary | ICD-10-CM

## 2021-09-29 DIAGNOSIS — I739 Peripheral vascular disease, unspecified: Secondary | ICD-10-CM

## 2021-09-29 DIAGNOSIS — E6609 Other obesity due to excess calories: Secondary | ICD-10-CM | POA: Diagnosis not present

## 2021-09-29 DIAGNOSIS — E1169 Type 2 diabetes mellitus with other specified complication: Secondary | ICD-10-CM

## 2021-09-29 DIAGNOSIS — I5022 Chronic systolic (congestive) heart failure: Secondary | ICD-10-CM

## 2021-09-29 DIAGNOSIS — E669 Obesity, unspecified: Secondary | ICD-10-CM

## 2021-09-29 DIAGNOSIS — Z6831 Body mass index (BMI) 31.0-31.9, adult: Secondary | ICD-10-CM

## 2021-09-29 LAB — COMPREHENSIVE METABOLIC PANEL WITH GFR
ALT: 20 U/L (ref 0–53)
AST: 15 U/L (ref 0–37)
Albumin: 4.5 g/dL (ref 3.5–5.2)
Alkaline Phosphatase: 44 U/L (ref 39–117)
BUN: 15 mg/dL (ref 6–23)
CO2: 26 meq/L (ref 19–32)
Calcium: 9.7 mg/dL (ref 8.4–10.5)
Chloride: 103 meq/L (ref 96–112)
Creatinine, Ser: 1.04 mg/dL (ref 0.40–1.50)
GFR: 74.5 mL/min
Glucose, Bld: 100 mg/dL — ABNORMAL HIGH (ref 70–99)
Potassium: 4 meq/L (ref 3.5–5.1)
Sodium: 137 meq/L (ref 135–145)
Total Bilirubin: 0.6 mg/dL (ref 0.2–1.2)
Total Protein: 7.5 g/dL (ref 6.0–8.3)

## 2021-09-29 NOTE — Assessment & Plan Note (Signed)
Cont on [place], [place]. Starting [place] via [place] doctor ?Wt Readings from Last 3 Encounters:  ?09/29/21 223 lb (101.2 kg)  ?09/01/21 223 lb (101.2 kg)  ?07/23/21 226 lb 6.4 oz (102.7 kg)  ? ? ?

## 2021-09-29 NOTE — Assessment & Plan Note (Signed)
Cont on Coreg, Aldactone, Entresto, Jardiance ?

## 2021-09-29 NOTE — Assessment & Plan Note (Signed)
Starting [place] via [place] doctor ?

## 2021-09-29 NOTE — Progress Notes (Signed)
? ?Subjective:  ?Patient ID: [name], male    DOB: 10-18-1955  Age: 66 y.o. MRN: [id number] ? ?CC: No chief complaint on file. ? ? ?HPI ?[name] presents for CAD, PVD, HTN, OA ? ?Outpatient Medications Prior to Visit  ?Medication Sig Dispense Refill  ? aspirin 81 MG chewable tablet Chew 81 mg by mouth daily. Take 1 by mouth daily    ? b complex vitamins capsule Take 1 capsule by mouth daily.    ? carvedilol (COREG) 25 MG tablet TAKE 1 TABLET (25 MG TOTAL) BY MOUTH 2 (TWO) TIMES DAILY WITH A MEAL. 180 tablet 3  ? clopidogrel (PLAVIX) 75 MG tablet Take 1 tablet (75 mg total) by mouth daily at 6 (six) AM. 30 tablet 3  ? empagliflozin (JARDIANCE) 25 MG TABS tablet Take 25 mg by mouth daily. (Patient taking differently: Take 12.5 mg by mouth daily.) 30 tablet 11  ? hydrALAZINE (APRESOLINE) 10 MG tablet Take 10 mg by mouth 3 (three) times daily.    ? isosorbide dinitrate (ISORDIL) 10 MG tablet Take 10 mg by mouth 3 (three) times daily.    ? linagliptin (TRADJENTA) 5 MG TABS tablet Take 1 tablet (5 mg total) by mouth daily. 90 tablet 3  ? metFORMIN (GLUCOPHAGE-XR) 500 MG 24 hr tablet Take 500 mg by mouth in the morning and at bedtime.    ? pantoprazole (PROTONIX) 40 MG tablet TAKE 1 TABLET (40 MG TOTAL) BY MOUTH DAILY. (Patient taking differently: Take 40 mg by mouth at bedtime.) 90 tablet 0  ? rosuvastatin (CRESTOR) 20 MG tablet Take 10 mg by mouth daily.    ? sacubitril-valsartan (ENTRESTO) 97-103 MG Take 1 tablet by mouth 2 (two) times daily.    ? Semaglutide,0.25 or 0.'5MG'$ /DOS, (OZEMPIC, 0.25 OR 0.5 MG/DOSE,) 2 MG/1.5ML SOPN Inject 0.5 mg into the skin once a week. 1.5 mL 3  ? spironolactone (ALDACTONE) 25 MG tablet Take 25 mg by mouth daily.     ? ?No facility-administered medications prior to visit.  ? ? ?ROS: ?Review of Systems  ?Constitutional:  Negative for appetite change, fatigue and unexpected weight change.  ?HENT:  Negative for congestion, nosebleeds, sneezing, sore throat and trouble swallowing.    ?Eyes:  Negative for itching and visual disturbance.  ?Respiratory:  Negative for cough.   ?Cardiovascular:  Negative for chest pain, palpitations and leg swelling.  ?Gastrointestinal:  Negative for abdominal distention, blood in stool, diarrhea and nausea.  ?Genitourinary:  Negative for frequency and hematuria.  ?Musculoskeletal:  Negative for back pain, gait problem, joint swelling and neck pain.  ?Skin:  Negative for rash.  ?Neurological:  Negative for dizziness, tremors, speech difficulty and weakness.  ?Psychiatric/Behavioral:  Negative for agitation, dysphoric mood and sleep disturbance. The patient is not nervous/anxious.   ? ?Objective:  ?BP 118/68 (BP Location: Left Arm, Patient Position: Sitting, Cuff Size: Large)   Pulse 61   Temp 98.3 ?F (36.8 ?C) (Oral)   Ht 6' (1.829 m)   Wt 223 lb (101.2 kg)   SpO2 95%   BMI 30.24 kg/m?  ? ?BP Readings from Last 3 Encounters:  ?09/29/21 118/68  ?09/01/21 95/61  ?07/23/21 124/74  ? ? ?Wt Readings from Last 3 Encounters:  ?09/29/21 223 lb (101.2 kg)  ?09/01/21 223 lb (101.2 kg)  ?07/23/21 226 lb 6.4 oz (102.7 kg)  ? ? ?Physical Exam ?Constitutional:   ?   General: He is not in acute distress. ?   Appearance: He is well-developed. He is  obese.  ?   Comments: NAD  ?Eyes:  ?   Conjunctiva/sclera: Conjunctivae normal.  ?   Pupils: Pupils are equal, round, and reactive to light.  ?Neck:  ?   Thyroid: No thyromegaly.  ?   Vascular: No JVD.  ?Cardiovascular:  ?   Rate and Rhythm: Normal rate and regular rhythm.  ?   Heart sounds: Normal heart sounds. No murmur heard. ?  No friction rub. No gallop.  ?Pulmonary:  ?   Effort: Pulmonary effort is normal. No respiratory distress.  ?   Breath sounds: Normal breath sounds. No wheezing or rales.  ?Chest:  ?   Chest wall: No tenderness.  ?Abdominal:  ?   General: Bowel sounds are normal. There is no distension.  ?   Palpations: Abdomen is soft. There is no mass.  ?   Tenderness: There is no abdominal tenderness. There is no  guarding or rebound.  ?Musculoskeletal:     ?   General: No tenderness. Normal range of motion.  ?   Cervical back: Normal range of motion.  ?Lymphadenopathy:  ?   Cervical: No cervical adenopathy.  ?Skin: ?   General: Skin is warm and dry.  ?   Findings: No rash.  ?Neurological:  ?   Mental Status: He is alert and oriented to person, place, and time.  ?   Cranial Nerves: No cranial nerve deficit.  ?   Motor: No abnormal muscle tone.  ?   Coordination: Coordination normal.  ?   Gait: Gait normal.  ?   Deep Tendon Reflexes: Reflexes are normal and symmetric.  ?Psychiatric:     ?   Behavior: Behavior normal.     ?   Thought Content: Thought content normal.     ?   Judgment: Judgment normal.  ? ? ?Lab Results  ?Component Value Date  ? WBC 12.2 (H) 04/30/2021  ? HGB 13.1 04/30/2021  ? HCT 38.0 (L) 04/30/2021  ? PLT 177 04/30/2021  ? GLUCOSE 185 (H) 04/30/2021  ? CHOL 228 (H) 04/30/2021  ? TRIG 93 04/30/2021  ? HDL 35 (L) 04/30/2021  ? LDLDIRECT 198.1 02/08/2013  ? LDLCALC 174 (H) 04/30/2021  ? ALT 30 04/28/2021  ? AST 26 04/28/2021  ? NA 134 (L) 04/30/2021  ? K 4.2 04/30/2021  ? CL 103 04/30/2021  ? CREATININE 1.17 04/30/2021  ? BUN 15 04/30/2021  ? CO2 21 (L) 04/30/2021  ? TSH 1.70 09/24/2015  ? PSA 0.80 02/08/2013  ? INR 1.1 04/28/2021  ? HGBA1C 6.3 (A) 07/23/2021  ? ? ?VAS [place] ABI WITH/WO TBI ? ?Result Date: 09/01/2021 ? LOWER EXTREMITY DOPPLER STUDY Patient Name:  [name]  Date of Exam:   09/01/2021 Medical Rec #: [id number]       Accession #:    [id number] Date of Birth: [birth year]        Patient Gender: M Patient Age:   19 years Exam Location:  [facility] Procedure:      VAS [place] ABI WITH/WO TBI Referring Phys: Jamelle Haring --------------------------------------------------------------------------------  Indications: Peripheral artery disease. High Risk Factors: Hypertension, hyperlipidemia, Diabetes, coronary artery                    disease. PVC's, CHF  Vascular Interventions: Right  Iliofemoral artery endarterectomy with                         profundoplasty , insertion of bilateral  common iliac                         artery kissing stents, patch angioplasty of right common                         femoral artery 04/29/2021. Performing Technologist: [name] RVT  Examination Guidelines: A complete evaluation includes at minimum, Doppler waveform signals and systolic blood pressure reading at the level of bilateral brachial, anterior tibial, and posterior tibial arteries, when vessel segments are accessible. Bilateral testing is considered an integral part of a complete examination. Photoelectric Plethysmograph (PPG) waveforms and toe systolic pressure readings are included as required and additional duplex testing as needed. Limited examinations for reoccurring indications may be performed as noted.  ABI Findings: +---------+------------------+-----+----------+--------+ Right    Rt Pressure (mmHg)IndexWaveform  Comment  +---------+------------------+-----+----------+--------+ Brachial 83                                        +---------+------------------+-----+----------+--------+ PTA      76                0.86 monophasicbrisk    +---------+------------------+-----+----------+--------+ DP       72                0.82 monophasicbrisk    +---------+------------------+-----+----------+--------+ Great Toe59                0.67 Abnormal           +---------+------------------+-----+----------+--------+ +---------+------------------+-----+--------+-------+ Left     Lt Pressure (mmHg)IndexWaveformComment +---------+------------------+-----+--------+-------+ Brachial 88                                     +---------+------------------+-----+--------+-------+ PTA      77                0.88 biphasic        +---------+------------------+-----+--------+-------+ DP       74                0.84 biphasic         +---------+------------------+-----+--------+-------+ Great Toe63                0.72 Normal          +---------+------------------+-----+--------+-------+ +-------+-----------+-----------+------------+------------+ ABI/TBIToday's ABIToday's TBIPrevious ABIPrevious TBI +-------+-----------+-----------+--------

## 2021-09-29 NOTE — Assessment & Plan Note (Signed)
No relapse 

## 2021-09-29 NOTE — Patient Instructions (Signed)
Blue- Emu cream ?

## 2021-09-29 NOTE — Assessment & Plan Note (Signed)
OA. Try Blue- Emu cream ?

## 2021-09-30 LAB — HEMOGLOBIN A1C: Hgb A1c MFr Bld: 7.2 % — ABNORMAL HIGH (ref 4.6–6.5)

## 2021-10-06 ENCOUNTER — Ambulatory Visit (INDEPENDENT_AMBULATORY_CARE_PROVIDER_SITE_OTHER): Payer: Medicare Other | Admitting: Endocrinology

## 2021-10-06 ENCOUNTER — Encounter: Payer: Self-pay | Admitting: Endocrinology

## 2021-10-06 VITALS — BP 116/62 | HR 45 | Ht 72.0 in | Wt 219.4 lb

## 2021-10-06 DIAGNOSIS — E1169 Type 2 diabetes mellitus with other specified complication: Secondary | ICD-10-CM

## 2021-10-06 DIAGNOSIS — E669 Obesity, unspecified: Secondary | ICD-10-CM | POA: Diagnosis not present

## 2021-10-06 MED ORDER — OZEMPIC (0.25 OR 0.5 MG/DOSE) 2 MG/1.5ML ~~LOC~~ SOPN: 0.5000 mg | PEN_INJECTOR | SUBCUTANEOUS | 3 refills | Status: DC

## 2021-10-06 NOTE — Progress Notes (Signed)
? ?Subjective:  ? ? Patient ID: [name], male    DOB: 1955-11-16, 66 y.o.   MRN: [id number] ? ?HPI ?Pt returns for f/u of diabetes mellitus:  ?DM type: 2 ?Dx'ed: 2017 ?Complications: CVA and CAD ?Therapy: 3 oral meds.  ?DKA: never ?Severe hypoglycemia: never ?Pancreatitis: never ?Other: he has never been on insulin.   ?Interval history: pt states cbg's vary from 75-277, but most are in the 100's.  pt states he feels well in general.  [facility] cannot obtain Ozem[facility].   ?Past Medical History:  ?Diagnosis Date  ? Aortic valve sclerosis   ? aortic valve sclerosis  ? Arthritis   ? CAD (coronary artery disease)   ? Myoview 5/18: 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 no ischemia EF 27%  ? Carotid artery disease ([place])   ? chronic total occlusion LCCA, retrograde flow from LECA to LICA, ,, 8-52%DPOE, s/p Right CEA 4235  ? Diabetes mellitus without complication ([place])   ? Dizziness   ? Ejection fraction < 50%   ? EF 40%, echo 2006, no ICD needed  / EF 40%, echo, 11/2009  ? Former cigarette smoker   ? 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 01/2002  ? Hyperlipidemia   ? Low HDL  ? Hypertension   ? Mitral regurgitation   ? mild, echo, 11/2009  ? Myocardial infarction [facility])   ? "they told me i had a silent heart attack"  ? Peripheral vascular disease ([place])   ? Dr. Erskin Burnet  ? Sleep disorder   ? Dr Gwenette Greet, lose weight and consider sleep study later  ? TIA (transient ischemic attack)   ? by history  ? Volume overload   ? ? ?Past Surgical History:  ?Procedure Laterality Date  ? CHOLECYSTECTOMY    ? COLONOSCOPY  03/19/2009  ? CORONARY ARTERY BYPASS GRAFT    ? ENDARTERECTOMY Right 11/05/2016  ? Procedure: ENDARTERECTOMY RIGHT CAROTID;  Surgeon: [name], MD;  Location: [facility] OR;  Service: Vascular;  Laterality: Right;  ? ENDARTERECTOMY FEMORAL Right 04/29/2021  ? Procedure: RIGHT ILIOFEMORAL ARTERY ENDARTERECTOMY WITH PROFUNDOPLASTY;  Surgeon: Cherre Robins, MD;  Location: [facility] OR;  Service: Vascular;  Laterality: Right;  ? INSERTION OF  ILIAC STENT Right 04/29/2021  ? Procedure: AORTAGRAM INSERTION OF BILATERAL COMMON ILIAC ARTERY KISSING STENTS;  Surgeon: Cherre Robins, MD;  Location: [place];  Service: Vascular;  Laterality: Right;  ? PATCH ANGIOPLASTY Right 11/05/2016  ? Procedure: RIGHT CAROTID ARTERY PATCH ANGIOPLASTY USING HEMASHIELD PLATINUM FINESSE PATCH;  Surgeon: [name], MD;  Location: [place];  Service: Vascular;  Laterality: Right;  ? PATCH ANGIOPLASTY Right 04/29/2021  ? Procedure: PATCH ANGIOPLASTY OF RIGHT COMMON FEMORAL ARTERY UISNG XENOSURE BOVINE PERICARDIUM [place];  Surgeon: Cherre Robins, MD;  Location: [place];  Service: Vascular;  Laterality: Right;  ? SHOULDER ARTHROSCOPY Right 05/25/2018  ? Procedure: RIGHT ARTHROSCOPY SHOULDER LABRAL DEBRIDEMENT, rotator cuff debridement,  BICEPS TENOTOMY and subacromial decompression;  Surgeon: Tania Ade, MD;  Location: [place];  Service: Orthopedics;  Laterality: Right;  ? ULTRASOUND GUIDANCE FOR VASCULAR ACCESS Left 04/29/2021  ? Procedure: ULTRASOUND GUIDANCE FOR VASCULAR ACCESS, LEFT FEMORAL ARTERY;  Surgeon: Cherre Robins, MD;  Location: [place];  Service: Vascular;  Laterality: Left;  ? ? ?Social History  ? ?Socioeconomic History  ? Marital status: Married  ?  Spouse name: Not on file  ? Number of children: Not on file  ? Years of education: Not on file  ?  Highest education level: Not on file  ?Occupational History  ? Not on file  ?Tobacco Use  ? Smoking status: Former  ?  Types: Cigarettes  ?  Quit date: 05/05/2002  ?  Years since quitting: 19.4  ?  Passive exposure: Never  ? Smokeless tobacco: Never  ?Vaping Use  ? Vaping Use: Never used  ?Substance and Sexual Activity  ? Alcohol use: Yes  ?  Alcohol/week: 0.0 standard drinks  ?  Comment: beer every now and then  ? Drug use: No  ? Sexual activity: Yes  ?Other Topics Concern  ? Not on file  ?Social History Narrative  ? No Regular exercise  ? ?Social Determinants of Health  ? ?Financial Resource Strain: Low Risk   ?  Difficulty of Paying Living Expenses: Not hard at all  ?Food Insecurity: No Food Insecurity  ? Worried About Charity fundraiser in the Last Year: Never true  ? Ran Out of Food in the Last Year: Never true  ?Transportation Needs: No Transportation Needs  ? Lack of Transportation (Medical): No  ? Lack of Transportation (Non-Medical): No  ?Physical Activity: Insufficiently Active  ? Days of Exercise per Week: 2 days  ? Minutes of Exercise per Session: 30 min  ?Stress: No Stress Concern Present  ? Feeling of Stress : Not at all  ?Social Connections: Socially Integrated  ? Frequency of Communication with Friends and Family: Twice a week  ? Frequency of Social Gatherings with Friends and Family: Twice a week  ? Attends Religious Services: More than 4 times per year  ? Active Member of Clubs or Organizations: Yes  ? Attends Archivist Meetings: More than 4 times per year  ? Marital Status: Married  ?Intimate Partner Violence: Not At Risk  ? Fear of Current or Ex-Partner: No  ? Emotionally Abused: No  ? Physically Abused: No  ? Sexually Abused: No  ? ? ?Current Outpatient Medications on File Prior to Visit  ?Medication Sig Dispense Refill  ? aspirin 81 MG chewable tablet Chew 81 mg by mouth daily. Take 1 by mouth daily    ? b complex vitamins capsule Take 1 capsule by mouth daily.    ? carvedilol (COREG) 25 MG tablet TAKE 1 TABLET (25 MG TOTAL) BY MOUTH 2 (TWO) TIMES DAILY WITH A MEAL. 180 tablet 3  ? clopidogrel (PLAVIX) 75 MG tablet Take 1 tablet (75 mg total) by mouth daily at 6 (six) AM. 30 tablet 3  ? empagliflozin (JARDIANCE) 25 MG TABS tablet Take 25 mg by mouth daily. (Patient taking differently: Take 12.5 mg by mouth daily.) 30 tablet 11  ? hydrALAZINE (APRESOLINE) 10 MG tablet Take 10 mg by mouth 3 (three) times daily.    ? isosorbide dinitrate (ISORDIL) 10 MG tablet Take 10 mg by mouth 3 (three) times daily.    ? linagliptin (TRADJENTA) 5 MG TABS tablet Take 1 tablet (5 mg total) by mouth daily. 90  tablet 3  ? metFORMIN (GLUCOPHAGE-XR) 500 MG 24 hr tablet Take 500 mg by mouth in the morning and at bedtime.    ? pantoprazole (PROTONIX) 40 MG tablet TAKE 1 TABLET (40 MG TOTAL) BY MOUTH DAILY. (Patient taking differently: Take 40 mg by mouth at bedtime.) 90 tablet 0  ? rosuvastatin (CRESTOR) 20 MG tablet Take 10 mg by mouth daily.    ? sacubitril-valsartan (ENTRESTO) 97-103 MG Take 1 tablet by mouth 2 (two) times daily.    ? spironolactone (ALDACTONE) 25 MG tablet  Take 25 mg by mouth daily.     ? ?No current facility-administered medications on file prior to visit.  ? ? ?No Known Allergies ? ?Family History  ?Problem Relation Age of Onset  ? Stroke Mother   ? Diabetes Father   ? COPD Father   ? Arthritis Father   ? Heart disease Father   ? Coronary artery disease Unknown   ?     Male 1st degree relative <50  ? Diabetes Unknown   ?     1st degree relative  ? Stroke Unknown   ?     Male 1st degree relative<50  ? ? ?BP 116/62 (BP Location: Left Arm, Patient Position: Sitting, Cuff Size: Normal)   Pulse (!) 45   Ht 6' (1.829 m)   Wt 219 lb 6.4 oz (99.5 kg)   SpO2 96%   BMI 29.76 kg/m?  ? ? ?Review of Systems ? ?   ?Objective:  ? Physical Exam ? ? ? ?Lab Results  ?Component Value Date  ? HGBA1C 7.2 (H) 09/29/2021  ? ?   ?Assessment & Plan:  ?Type 2 DM: uncontrolled.   ? ?Patient Instructions  ?check your blood sugar once a day.  vary the time of day when you check, between before the 3 meals, and at bedtime.  also check if you have symptoms of your blood sugar being too high or too low.  please keep a record of the readings and bring it to your next appointment here (or you can bring the meter itself).  You can write it on any piece of paper.  please call us sooner if your blood sugar goes below 70, or if you have a lot of readings over 200.   ?I have sent a prescription to your pharmacy, for the [place].  If it is unavailable, I have asked them to advise of alternative.  ?Please continue the same other 3  diabetes medications.   ?Please come back for a follow-up appointment in 3 months.   ? ? ?

## 2021-10-06 NOTE — Patient Instructions (Signed)
check your blood sugar once a day.  vary the time of day when you check, between before the 3 meals, and at bedtime.  also check if you have symptoms of your blood sugar being too high or too low.  please keep a record of the readings and bring it to your next appointment here (or you can bring the meter itself).  You can write it on any piece of paper.  please call us sooner if your blood sugar goes below 70, or if you have a lot of readings over 200.   ?I have sent a prescription to your pharmacy, for the [place].  If it is unavailable, I have asked them to advise of alternative.  ?Please continue the same other 3 diabetes medications.   ?Please come back for a follow-up appointment in 3 months.   ?

## 2021-11-26 ENCOUNTER — Ambulatory Visit: Payer: Medicare Other | Admitting: Endocrinology

## 2022-01-12 ENCOUNTER — Telehealth: Payer: Self-pay

## 2022-01-12 NOTE — Telephone Encounter (Signed)
Spoke with the patient in regards to scheduling a follow up visit with another provider here at the practice, says that the [place] has taken over his DM management.

## 2022-07-06 ENCOUNTER — Ambulatory Visit (INDEPENDENT_AMBULATORY_CARE_PROVIDER_SITE_OTHER): Payer: Medicare Other

## 2022-07-06 VITALS — Ht 72.0 in | Wt 221.0 lb

## 2022-07-06 DIAGNOSIS — Z0000 Encounter for general adult medical examination without abnormal findings: Secondary | ICD-10-CM | POA: Diagnosis not present

## 2022-07-06 NOTE — Patient Instructions (Signed)
Mr. [name] , Thank you for taking time to come for your Medicare Wellness Visit. I appreciate your ongoing commitment to your health goals. Please review the following plan we discussed and let me know if I can assist you in the future.   These are the goals we discussed:  Goals      My goal for 2024 to to keep on moving on.        This is a list of the screening recommended for you and due dates:  Health Maintenance  Topic Date Due   Eye exam for diabetics  Never done   Yearly kidney health urinalysis for diabetes  Never done   Hepatitis C Screening: USPSTF Recommendation to screen - Ages 6-79 yo.  Never done   Colon Cancer Screening  03/20/2019   COVID-19 Vaccine (4 - 2023-24 season) 03/05/2022   Hemoglobin A1C  04/01/2022   Complete foot exam   04/16/2022   Yearly kidney function blood test for diabetes  09/30/2022   Medicare Annual Wellness Visit  07/07/2023   DTaP/Tdap/Td vaccine (3 - Td or Tdap) 07/05/2024   Pneumonia Vaccine (3 - PPSV23 or PCV20) 07/16/2024   Flu Shot  Completed   Zoster (Shingles) Vaccine  Completed   HPV Vaccine  Aged Out    Advanced directives: Yes; Please bring a copy of your health care power of attorney and living will to the office at your convenience.  Conditions/risks identified: Yes; Type II Diabetes  Next appointment: Follow up in one year for your annual wellness visit.   Preventive Care 26 Years and Older, Male  Preventive care refers to lifestyle choices and visits with your health care provider that can promote health and wellness. What does preventive care include? A yearly physical exam. This is also called an annual well check. Dental exams once or twice a year. Routine eye exams. Ask your health care provider how often you should have your eyes checked. Personal lifestyle choices, including: Daily care of your teeth and gums. Regular physical activity. Eating a healthy diet. Avoiding tobacco and drug use. Limiting alcohol  use. Practicing safe sex. Taking low doses of aspirin every day. Taking vitamin and mineral supplements as recommended by your health care provider. What happens during an annual well check? The services and screenings done by your health care provider during your annual well check will depend on your age, overall health, lifestyle risk factors, and family history of disease. Counseling  Your health care provider may ask you questions about your: Alcohol use. Tobacco use. Drug use. Emotional well-being. Home and relationship well-being. Sexual activity. Eating habits. History of falls. Memory and ability to understand (cognition). Work and work Statistician. Screening  You may have the following tests or measurements: Height, weight, and BMI. Blood pressure. Lipid and cholesterol levels. These may be checked every 5 years, or more frequently if you are over 40 years old. Skin check. Lung cancer screening. You may have this screening every year starting at age 56 if you have a 30-pack-year history of smoking and currently smoke or have quit within the past 15 years. Fecal occult blood test (FOBT) of the stool. You may have this test every year starting at age 101. Flexible sigmoidoscopy or colonoscopy. You may have a sigmoidoscopy every 5 years or a colonoscopy every 10 years starting at age 46. Prostate cancer screening. Recommendations will vary depending on your family history and other risks. Hepatitis C blood test. Hepatitis B blood test. Sexually transmitted disease (  STD) testing. Diabetes screening. This is done by checking your blood sugar (glucose) after you have not eaten for a while (fasting). You may have this done every 1-3 years. Abdominal aortic aneurysm (AAA) screening. You may need this if you are a current or former smoker. Osteoporosis. You may be screened starting at age 65 if you are at high risk. Talk with your health care provider about your test results,  treatment options, and if necessary, the need for more tests. Vaccines  Your health care provider may recommend certain vaccines, such as: Influenza vaccine. This is recommended every year. Tetanus, diphtheria, and acellular pertussis (Tdap, Td) vaccine. You may need a Td booster every 10 years. Zoster vaccine. You may need this after age 4. Pneumococcal 13-valent conjugate (PCV13) vaccine. One dose is recommended after age 50. Pneumococcal polysaccharide (PPSV23) vaccine. One dose is recommended after age 57. Talk to your health care provider about which screenings and vaccines you need and how often you need them. This information is not intended to replace advice given to you by your health care provider. Make sure you discuss any questions you have with your health care provider. Document Released: 07/18/2015 Document Revised: 03/10/2016 Document Reviewed: 04/22/2015 Elsevier Interactive Patient Education  2017 [place] Prevention in the Home Falls can cause injuries. They can happen to people of all ages. There are many things you can do to make your home safe and to help prevent falls. What can I do on the outside of my home? Regularly fix the edges of walkways and driveways and fix any cracks. Remove anything that might make you trip as you walk through a door, such as a raised step or threshold. Trim any bushes or trees on the path to your home. Use bright outdoor lighting. Clear any walking paths of anything that might make someone trip, such as rocks or tools. Regularly check to see if handrails are loose or broken. Make sure that both sides of any steps have handrails. Any raised decks and porches should have guardrails on the edges. Have any leaves, snow, or ice cleared regularly. Use sand or salt on walking paths during winter. Clean up any spills in your garage right away. This includes oil or grease spills. What can I do in the bathroom? Use night  lights. Install grab bars by the toilet and in the tub and shower. Do not use towel bars as grab bars. Use non-skid mats or decals in the tub or shower. If you need to sit down in the shower, use a plastic, non-slip stool. Keep the floor dry. Clean up any water that spills on the floor as soon as it happens. Remove soap buildup in the tub or shower regularly. Attach bath mats securely with double-sided non-slip rug tape. Do not have throw rugs and other things on the floor that can make you trip. What can I do in the bedroom? Use night lights. Make sure that you have a light by your bed that is easy to reach. Do not use any sheets or blankets that are too big for your bed. They should not hang down onto the floor. Have a firm chair that has side arms. You can use this for support while you get dressed. Do not have throw rugs and other things on the floor that can make you trip. What can I do in the kitchen? Clean up any spills right away. Avoid walking on wet floors. Keep items that you use a lot  in easy-to-reach places. If you need to reach something above you, use a strong step stool that has a grab bar. Keep electrical cords out of the way. Do not use floor polish or wax that makes floors slippery. If you must use wax, use non-skid floor wax. Do not have throw rugs and other things on the floor that can make you trip. What can I do with my stairs? Do not leave any items on the stairs. Make sure that there are handrails on both sides of the stairs and use them. Fix handrails that are broken or loose. Make sure that handrails are as long as the stairways. Check any carpeting to make sure that it is firmly attached to the stairs. Fix any carpet that is loose or worn. Avoid having throw rugs at the top or bottom of the stairs. If you do have throw rugs, attach them to the floor with carpet tape. Make sure that you have a light switch at the top of the stairs and the bottom of the stairs. If  you do not have them, ask someone to add them for you. What else can I do to help prevent falls? Wear shoes that: Do not have high heels. Have rubber bottoms. Are comfortable and fit you well. Are closed at the toe. Do not wear sandals. If you use a stepladder: Make sure that it is fully opened. Do not climb a closed stepladder. Make sure that both sides of the stepladder are locked into place. Ask someone to hold it for you, if possible. Clearly mark and make sure that you can see: Any grab bars or handrails. First and last steps. Where the edge of each step is. Use tools that help you move around (mobility aids) if they are needed. These include: Canes. Walkers. Scooters. Crutches. Turn on the lights when you go into a dark area. Replace any light bulbs as soon as they burn out. Set up your furniture so you have a clear path. Avoid moving your furniture around. If any of your floors are uneven, fix them. If there are any pets around you, be aware of where they are. Review your medicines with your doctor. Some medicines can make you feel dizzy. This can increase your chance of falling. Ask your doctor what other things that you can do to help prevent falls. This information is not intended to replace advice given to you by your health care provider. Make sure you discuss any questions you have with your health care provider. Document Released: 04/17/2009 Document Revised: 11/27/2015 Document Reviewed: 07/26/2014 Elsevier Interactive Patient Education  2017 [employer].

## 2022-07-06 NOTE — Progress Notes (Addendum)
Virtual Visit via Telephone Note  I connected with  [name] on 07/06/22 at  8:45 AM EST by telephone and verified that I am speaking with the correct person using two identifiers.  Location: Patient: Home Provider: [place] Persons participating in the virtual visit: [place]   I discussed the limitations, risks, security and privacy concerns of performing an evaluation and management service by telephone and the availability of in person appointments. The patient expressed understanding and agreed to proceed.  Interactive audio and video telecommunications were attempted between this nurse and patient, however failed, due to patient having technical difficulties OR patient did not have access to video capability.  We continued and completed visit with audio only.  Some vital signs may be absent or patient reported.   Sheral Flow, LPN  Subjective:   FREDICK SCHLOSSER is a 67 y.o. male who presents for Medicare Annual/Subsequent preventive examination.  Review of Systems     Cardiac Risk Factors include: advanced age (>58mn, >>8women);diabetes mellitus;male gender;family history of premature cardiovascular disease;hypertension;dyslipidemia     Objective:    Today's Vitals   07/06/22 0848 07/06/22 0858  Weight: 221 lb (100.2 kg)   Height: 6' (1.829 m)   PainSc: 0-No pain 0-No pain   Body mass index is 29.97 kg/m.     07/06/2022    9:01 AM 07/03/2021   11:28 AM 04/29/2021    1:00 PM 04/28/2021    9:10 AM 05/22/2018   10:39 AM 02/02/2018    3:50 PM 11/29/2017   11:05 AM  Advanced Directives  Does Patient Have a Medical Advance Directive? Yes Yes Yes Yes No Yes No  Type of AParamedicof AMount ShastaLiving will H[place]villeLiving will HLyerlyLiving will HPrado VerdeLiving will  Living will   Does patient want to make changes to medical advance directive?   No -  Patient declined No - Patient declined     Copy of HRed [place]in Chart? No - copy requested No - copy requested No - copy requested No - copy requested     Would patient like information on creating a medical advance directive?     No - Patient declined  No - Patient declined    Current Medications (verified) Outpatient Encounter Medications as of 07/06/2022  Medication Sig   aspirin 81 MG chewable tablet Chew 81 mg by mouth daily. Take 1 by mouth daily   b complex vitamins capsule Take 1 capsule by mouth daily.   carvedilol (COREG) 25 MG tablet TAKE 1 TABLET (25 MG TOTAL) BY MOUTH 2 (TWO) TIMES DAILY WITH A MEAL.   empagliflozin (JARDIANCE) 25 MG TABS tablet Take 25 mg by mouth daily. (Patient taking differently: Take 12.5 mg by mouth daily.)   hydrALAZINE (APRESOLINE) 10 MG tablet Take 10 mg by mouth 3 (three) times daily.   isosorbide dinitrate (ISORDIL) 10 MG tablet Take 10 mg by mouth 3 (three) times daily.   linagliptin (TRADJENTA) 5 MG TABS tablet Take 1 tablet (5 mg total) by mouth daily.   metFORMIN (GLUCOPHAGE-XR) 500 MG 24 hr tablet Take 500 mg by mouth in the morning and at bedtime.   pantoprazole (PROTONIX) 40 MG tablet TAKE 1 TABLET (40 MG TOTAL) BY MOUTH DAILY. (Patient taking differently: Take 40 mg by mouth at bedtime.)   rosuvastatin (CRESTOR) 20 MG tablet Take 10 mg by mouth daily.   sacubitril-valsartan (ENTRESTO) 97-103 MG Take  1 tablet by mouth 2 (two) times daily.   Semaglutide,0.25 or 0.5MG/DOS, (OZEMPIC, 0.25 OR 0.5 MG/DOSE,) 2 MG/1.5ML SOPN Inject 0.5 mg into the skin once a week. If unavailable, please recommend alternative.   spironolactone (ALDACTONE) 25 MG tablet Take 25 mg by mouth daily.    [DISCONTINUED] clopidogrel (PLAVIX) 75 MG tablet Take 1 tablet (75 mg total) by mouth daily at 6 (six) AM.   No facility-administered encounter medications on file as of 07/06/2022.    Allergies (verified) Patient has no known allergies.   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 Erskin Burnet   Sleep disorder    Dr Gwenette Greet, lose weight and consider sleep study later   TIA (transient ischemic attack)    by history   Volume overload    Past Surgical History:  Procedure Laterality Date   CHOLECYSTECTOMY     COLONOSCOPY  03/19/2009   CORONARY ARTERY BYPASS GRAFT     ENDARTERECTOMY Right 11/05/2016   Procedure: ENDARTERECTOMY RIGHT CAROTID;  Surgeon: [name], MD;  Location: [place];  Service: Vascular;  Laterality: Right;   ENDARTERECTOMY FEMORAL Right 04/29/2021   Procedure: RIGHT ILIOFEMORAL ARTERY ENDARTERECTOMY WITH PROFUNDOPLASTY;  Surgeon: Cherre Robins, MD;  Location: [place];  Service: Vascular;  Laterality: Right;   INSERTION OF ILIAC STENT Right 04/29/2021   Procedure: Maxcine Ham INSERTION OF BILATERAL COMMON ILIAC ARTERY KISSING STENTS;  Surgeon: Cherre Robins, MD;  Location: [place];  Service: Vascular;  Laterality: Right;   PATCH ANGIOPLASTY Right 11/05/2016   Procedure: RIGHT CAROTID ARTERY PATCH ANGIOPLASTY USING [place];  Surgeon: [name], MD;  Location: [place];  Service: Vascular;  Laterality: Right;   PATCH ANGIOPLASTY Right 04/29/2021   Procedure: PATCH  ANGIOPLASTY OF RIGHT COMMON FEMORAL ARTERY UISNG XENOSURE BOVINE PERICARDIUM [place];  Surgeon: Cherre Robins, MD;  Location: [place];  Service: Vascular;  Laterality: Right;   SHOULDER ARTHROSCOPY Right 05/25/2018   Procedure: RIGHT ARTHROSCOPY SHOULDER LABRAL DEBRIDEMENT, rotator cuff debridement,  BICEPS TENOTOMY and subacromial decompression;  Surgeon: Tania Ade, MD;  Location: [place];  Service: Orthopedics;  Laterality: Right;   ULTRASOUND GUIDANCE FOR VASCULAR ACCESS Left 04/29/2021   Procedure: ULTRASOUND GUIDANCE FOR VASCULAR ACCESS, LEFT FEMORAL ARTERY;  Surgeon: Cherre Robins, MD;  Location: [facility] OR;  Service: Vascular;  Laterality: Left;   Family History  Problem Relation Age of Onset   Stroke Mother    Diabetes Father    COPD Father    Arthritis Father    Heart disease Father  Coronary artery disease Unknown        Male 1st degree relative <50   Diabetes Unknown        1st degree relative   Stroke Unknown        Male 1st degree relative<50   Social History   Socioeconomic History   Marital status: Married    Spouse name: Not on file   Number of children: Not on file   Years of education: Not on file   Highest education level: Not on file  Occupational History   Not on file  Tobacco Use   Smoking status: Former    Types: Cigarettes    Quit date: 05/05/2002    Years since quitting: 20.1    Passive exposure: Never   Smokeless tobacco: Never  Vaping Use   Vaping Use: Never used  Substance and Sexual Activity   Alcohol use: Yes    Alcohol/week: 0.0 standard drinks of alcohol    Comment: beer every now and then   Drug use: No   Sexual activity: Yes  Other Topics Concern   Not on file  Social History Narrative   No Regular exercise   Social Determinants of Health   Financial Resource Strain: Low Risk  (07/06/2022)   Overall Financial Resource Strain (CARDIA)    Difficulty of Paying Living Expenses: Not hard at all  Food Insecurity: No Food Insecurity  (07/06/2022)   Hunger Vital Sign    Worried About Running Out of Food in the Last Year: Never true    Ran Out of Food in the Last Year: Never true  Transportation Needs: No Transportation Needs (07/06/2022)   PRAPARE - Hydrologist (Medical): No    Lack of Transportation (Non-Medical): No  Physical Activity: Insufficiently Active (07/06/2022)   Exercise Vital Sign    Days of Exercise per Week: 5 days    Minutes of Exercise per Session: 20 min  Stress: No Stress Concern Present (07/06/2022)   [place]    Feeling of Stress : Not at all  Social Connections: [place] (07/06/2022)   Social Connection and Isolation Panel [NHANES]    Frequency of Communication with Friends and Family: More than three times a week    Frequency of Social Gatherings with Friends and Family: Once a week    Attends Religious Services: More than 4 times per year    Active Member of [employer] or Organizations: Yes    Attends Music therapist: More than 4 times per year    Marital Status: Married    Tobacco Counseling Counseling given: Not Answered   Clinical Intake:  Pre-visit preparation completed: Yes  Pain : No/denies pain Pain Score: 0-No pain     BMI - recorded: 29.97 Nutritional Status: BMI 25 -29 Overweight Nutritional Risks: None Diabetes: Yes CBG done?: No Did pt. bring in CBG monitor from home?: No  How often do you need to have someone help you when you read instructions, pamphlets, or other written materials from your doctor or pharmacy?: 1 - Never What is the last grade level you completed in school?: HSG  Nutrition Risk Assessment:  Has the patient had any N/V/D within the last 2 months?  No  Does the patient have any non-healing wounds?  No  Has the patient had any unintentional weight loss or weight gain?  No   Diabetes:  Is the patient diabetic?  Yes  If diabetic,  was a  CBG obtained today?  No  Did the patient bring in their glucometer from home?  No  How often do you monitor your CBG's? None.   Financial Strains and Diabetes Management:  Are you having any financial strains with the device, your supplies or your medication? No .  Does the patient want to be seen by Chronic Care Management for management of their diabetes?  No  Would the patient like to be referred to a Nutritionist or for Diabetic Management?  No   Diabetic Exams:  Diabetic Eye Exam: Completed at [facility] Diabetic Foot Exam: Com[facility] at [facility]   Interpreter Needed?: No  Information entered by :: Lisette Abu, LPN.   Activities of Daily Living    07/06/2022    9:02 AM 07/06/2022    7:45 AM  In your present state of health, do you have any difficulty performing the following activities:  Hearing? 1 1  Vision? 0 0  Difficulty concentrating or making decisions? 0 0  Walking or climbing stairs? 0 0  Dressing or bathing? 0 0  Doing errands, shopping? 0 0  Preparing Food and eating ? N N  Using the Toilet? N N  In the past six months, have you accidently leaked urine? N N  Do you have problems with loss of bowel control? N N  Managing your Medications? N N  Managing your Finances? N N  Housekeeping or managing your Housekeeping? N N    Patient Care Team: Plotnikov, Evie Lacks, MD as PCP - General Nahser, Wonda Cheng, MD as PCP - Cardiology (Cardiology) Renato Shin, MD (Inactive) as Consulting Physician (Endocrinology)  Indicate any recent Medical Services you may have received from other than [facility] providers in the past year (date may be approximate).     Assessment:   This is a routine wellness examination for [name].  Hearing/Vision screen Hearing Screening - Comments:: Patient wears hearing aids. Vision Screening - Comments:: Wears rx glasses - up to date with routine eye exams with [facility]   Dietary issues and exercise activities  discussed: Current Exercise Habits: Home exercise routine, Type of exercise: walking;Other - see comments (handyman), Time (Minutes): 20, Frequency (Times/Week): 5, Weekly Exercise (Minutes/Week): 100, Intensity: Moderate, Exercise limited by: cardiac condition(s);orthopedic condition(s)   Goals Addressed             This Visit's Progress    My goal for 2024 to to keep on moving on.        Depression Screen    07/06/2022    8:51 AM 07/03/2021   11:34 AM 07/03/2021   11:26 AM 03/03/2021   10:33 AM 08/17/2017    7:53 AM  PHQ 2/9 Scores  PHQ - 2 Score 0 0 0 0 0  PHQ- 9 Score    0     Fall Risk    07/06/2022    8:51 AM 07/06/2022    7:45 AM 07/03/2021   11:28 AM 03/03/2021   10:33 AM  Fall Risk   Falls in the past year? 0 0 0 0  Number falls in past yr: 0 0 0 0  Injury with Fall? 0 0 0 0  Risk for fall due to : No Fall Risks   No Fall Risks  Follow up Falls prevention discussed  Falls evaluation completed     FALL RISK PREVENTION PERTAINING TO THE HOME:  Any stairs in or around the home? No  If so, are there any without handrails? No  Home  free of loose throw rugs in walkways, pet beds, electrical cords, etc? Yes  Adequate lighting in your home to reduce risk of falls? Yes   ASSISTIVE DEVICES UTILIZED TO PREVENT FALLS:  Life alert? No  Use of a cane, walker or w/c? No  Grab bars in the bathroom? Yes  Shower chair or bench in shower? Yes  Elevated toilet seat or a handicapped toilet? Yes   TIMED UP AND GO:  Was the test performed? No . Phone Visit   Cognitive Function:        07/06/2022    9:03 AM  6CIT Screen  What Year? 0 points  What month? 0 points  What time? 0 points  Count back from 20 0 points  Months in reverse 0 points  Repeat phrase 0 points  Total Score 0 points    Immunizations Immunization History  Administered Date(s) Administered   Influenza Split 06/10/2018   Influenza Whole 03/23/2010, 04/14/2012   Influenza, High Dose Seasonal PF  03/05/2021   Influenza,inj,Quad PF,6+ Mos 05/04/2013, 04/04/2015, 06/17/2018, 02/23/2019, 02/23/2019   Influenza-Unspecified 05/12/2016, 04/20/2017, 02/04/2019, 03/05/2021, 03/24/2022   PFIZER(Purple Top)SARS-COV-2 Vaccination 08/30/2019, 09/25/2019, 04/24/2020   Pneumococcal Conjugate-13 08/13/2013   Pneumococcal Polysaccharide-23 02/09/2013, 07/17/2019   Td 11/27/2008   Tdap 07/05/2014   Zoster Recombinat (Shingrix) 03/10/2017, 08/17/2017    TDAP status: Up to date  Flu Vaccine status: Up to date  Pneumococcal vaccine status: Up to date  Covid-19 vaccine status: Completed vaccines  Qualifies for Shingles Vaccine? Yes   Zostavax completed No   Shingrix Completed?: Yes  Screening Tests Health Maintenance  Topic Date Due   OPHTHALMOLOGY EXAM  Never done   Diabetic kidney evaluation - Urine ACR  Never done   Hepatitis C Screening  Never done   COLONOSCOPY (Pts 45-63yr Insurance coverage will need to be confirmed)  03/20/2019   COVID-19 Vaccine (4 - 2023-24 season) 03/05/2022   HEMOGLOBIN A1C  04/01/2022   FOOT EXAM  04/16/2022   Diabetic kidney evaluation - eGFR measurement  09/30/2022   Medicare Annual Wellness (AWV)  07/07/2023   DTaP/Tdap/Td (3 - Td or Tdap) 07/05/2024   Pneumonia Vaccine 68 Years old (3 - PPSV23 or PCV20) 07/16/2024   INFLUENZA VACCINE  Completed   Zoster Vaccines- Shingrix  Completed   HPV VACCINES  Aged Out    Health Maintenance  Health Maintenance Due  Topic Date Due   OPHTHALMOLOGY EXAM  Never done   Diabetic kidney evaluation - Urine ACR  Never done   Hepatitis C Screening  Never done   COLONOSCOPY (Pts 45-482yrInsurance coverage will need to be confirmed)  03/20/2019   COVID-19 Vaccine (4 - 2023-24 season) 03/05/2022   HEMOGLOBIN A1C  04/01/2022   FOOT EXAM  04/16/2022    Colorectal cancer screening: Type of screening: Colonoscopy. [facility]. Repeat every 5-10 years  Lung Cancer Screening: (Low Dose CT  Chest recommended if Age 68-80ears, 30 pack-year currently smoking OR have quit w/in 15years.) does not qualify.   Lung Cancer Screening Referral: no  Additional Screening:  Hepatitis C Screening: does qualify; Completed no  Vision Screening: Recommended annual ophthalmology exams for early detection of glaucoma and other disorders of the eye. Is the patient up to date with their annual eye exam?  Yes  Who is the provider or what is the name of the office in which the patient [facility] If [facility] is not established with a provider, would they like  to be referred to a provider to establish care? No .   Dental Screening: Recommended annual dental exams for proper oral hygiene  Community Resource Referral / Chronic Care Management: CRR required this visit?  No   CCM required this visit?  No      Plan:     I have personally reviewed and noted the following in the patient's chart:   Medical and social history Use of alcohol, tobacco or illicit drugs  Current medications and supplements including opioid prescriptions. Patient is not currently taking opioid prescriptions. Functional ability and status Nutritional status Physical activity Advanced directives List of other physicians Hospitalizations, surgeries, and ER visits in previous 12 months Vitals Screenings to include cognitive, depression, and falls Referrals and appointments  In addition, I have reviewed and discussed with patient certain preventive protocols, quality metrics, and best practice recommendations. A written personalized care plan for preventive services as well as general preventive health recommendations were provided to patient.     Sheral Flow, LPN   11/05/6501   Nurse Notes: N/A  Medical screening examination/treatment/procedure(s) were performed by non-physician practitioner and as supervising physician I was immediately available for consultation/collaboration.  I  agree with above. Lew Dawes, MD

## 2022-08-10 ENCOUNTER — Ambulatory Visit (INDEPENDENT_AMBULATORY_CARE_PROVIDER_SITE_OTHER): Payer: Medicare Other | Admitting: Vascular Surgery

## 2022-08-10 ENCOUNTER — Ambulatory Visit (HOSPITAL_COMMUNITY)
Admission: RE | Admit: 2022-08-10 | Discharge: 2022-08-10 | Disposition: A | Discharge status: Home / Self Care | Payer: Medicare Other | Source: Ambulatory Visit | Attending: Vascular Surgery | Admitting: Vascular Surgery

## 2022-08-10 ENCOUNTER — Encounter: Payer: Self-pay | Admitting: Vascular Surgery

## 2022-08-10 VITALS — BP 122/77 | HR 66 | Temp 97.9°F | Resp 20 | Ht 72.0 in | Wt 222.0 lb

## 2022-08-10 DIAGNOSIS — I7409 Other arterial embolism and thrombosis of abdominal aorta: Secondary | ICD-10-CM | POA: Diagnosis present

## 2022-08-10 DIAGNOSIS — I739 Peripheral vascular disease, unspecified: Secondary | ICD-10-CM | POA: Diagnosis present

## 2022-08-10 LAB — VAS US ABI WITH/WO TBI
Left ABI: 0.84
Right ABI: 0.76

## 2022-08-10 NOTE — Progress Notes (Signed)
[facility] [place] PROGRESS NOTE  ASSESSMENT / PLAN: [name] is a 67 y.o. male status post bilateral common iliac "kissing" angioplasty and stenting (RCIA 11x39m VBX, LCIA 10x341mVBX) and right iliofemoral endarterectomy and profundaplasty with bovine pericardial patch angioplasty 04/29/21 for rest pain.  He continues to do well.  Follow up with me in 12 months with repeat ABI.   SUBJECTIVE: Walking without difficulty. No rest pain.  No claudication type symptoms. No ulcers about the feet. Doing well overall.  OBJECTIVE: BP 122/77 (BP Location: Left Arm, Patient Position: Sitting, Cuff Size: Normal)   Pulse 66   Temp 97.9 F (36.6 C)   Resp 20   Ht 6' (1.829 m)   Wt 222 lb (100.7 kg)   SpO2 98%   BMI 30.11 kg/m   Constitutional: well appearing. no acute distress. CNS: 5/5 throughout. Normal gait and station. Pulmonary: unlabored Abdomen: soft. Vascular: right groin completely healed. Well perfused feet bilaterally.     Latest Ref Rng & Units 04/30/2021   12:41 AM 04/28/2021    9:14 AM 05/22/2018   10:49 AM  CBC  WBC 4.0 - 10.5 K/uL 12.2  5.2  7.9   Hemoglobin 13.0 - 17.0 g/dL 13.1  15.2  15.1   Hematocrit 39.0 - 52.0 % 38.0  45.1  45.9   Platelets 150 - 400 K/uL 177  188  192         Latest Ref Rng & Units 09/29/2021    3:29 PM 04/30/2021   12:41 AM 04/28/2021    9:14 AM  CMP  Glucose 70 - 99 mg/dL 100  185  178   BUN 6 - 23 mg/dL '15  15  13   '$ Creatinine 0.40 - 1.50 mg/dL 1.04  1.17  1.14   Sodium 135 - 145 mEq/L 137  134  133   Potassium 3.5 - 5.1 mEq/L 4.0  4.2  4.1   Chloride 96 - 112 mEq/L 103  103  103   CO2 19 - 32 mEq/L '26  21  18   '$ Calcium 8.4 - 10.5 mg/dL 9.7  9.0  9.2   Total Protein 6.0 - 8.3 g/dL 7.5   7.0   Total Bilirubin 0.2 - 1.2 mg/dL 0.6   1.0   Alkaline Phos 39 - 117 U/L 44   39   AST 0 - 37 U/L 15   26   ALT 0 - 53 U/L 20   30     ABI  +-------+-----------+-----------+------------+------------+   ABI/TBIToday's ABIToday's TBIPrevious ABIPrevious TBI  +-------+-----------+-----------+------------+------------+  Right 0.76       0.57       0.86        0.67          +-------+-----------+-----------+------------+------------+  Left  0.84       0.81       0.88        0.72          +-------+-----------+-----------+------------+------------+   ThYevonne AlineHaStanford BreedMD [facility] Number: [phone]/12/2022 12:17 PM

## 2022-08-25 ENCOUNTER — Encounter: Payer: Self-pay | Admitting: Vascular Surgery

## 2022-10-11 NOTE — Progress Notes (Unsigned)
[facility] [place]  ASSESSMENT / PLAN: OLANDA CARUFEL is a 67 y.o. male with symptomatic right 60 - 79 % carotid artery stenosis.   The patient should continue best medical therapy for carotid artery stenosis including: Complete cessation from all tobacco products. Blood glucose control with goal A1c < 7%. Blood pressure control with goal blood pressure < 140/90 mmHg. Lipid reduction therapy with goal LDL-C <70  Aspirin 81mg  PO QD.  Clopidogrel 75mg  PO QD. Atorvastatin 40-80mg  PO QD (or other "high intensity" statin therapy).  Patient describes a branch retinal artery occlusion found at the [place].  CHIEF COMPLAINT: Visual disturbance  HISTORY OF PRESENT ILLNESS: KINNEY GUERECA is a 67 y.o. male who returns to clinic for evaluation of carotid artery stenosis.  The patient is well-known to me for revascularization efforts in his right leg which were successful to resolve rest pain.  See below for full detail.  The patient describes visual disturbances which were evaluated at the [place].  A funduscopic exam was performed which was worrisome, per the patient's report.  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  This prompted evaluation for symptomatic carotid artery stenosis.  The patient is status post carotid endarterectomy by my partner Dr. Arbie Cookey in 2018.  He did very well in recovery.  He has been undergoing surveillance.  No early restenosis was noted.  Evaluation was worrisome for restenosis of the right internal carotid artery which had been endarterectomized.  A CT angiogram was performed which revealed about 85% stenosis of the origin of the right internal carotid artery.  I reviewed these findings in detail with the patient.  The detail available to me from the [place] is a bit incomplete.  I was able to review the radiographic reports, as detailed above.  I was not able to review the CT angiogram images personally.  I counseled him about the 2 modes of treatment for  symptomatic carotid artery stenosis.  I encouraged him to initiate clopidogrel therapy today.  Ideally, we would treat his recurrent stenosis with a stent procedure to reduce his risk of cranial nerve injury.  He is understanding and willing to proceed.  VASCULAR SURGICAL HISTORY:  Bilateral common iliac "kissing" angioplasty and stenting (RCIA 11x71mm VBX, LCIA 10x52mm VBX) and right iliofemoral endarterectomy and profundaplasty with bovine pericardial patch angioplasty 04/29/21.  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 Georganna Skeans   Sleep disorder    Dr Shelle Iron, lose weight and consider sleep study later   Sleep disorder    TIA (transient ischemic attack)    by history  Volume overload     Past Surgical History:  Procedure Laterality Date   CHOLECYSTECTOMY     COLONOSCOPY  03/19/2009   CORONARY ARTERY BYPASS GRAFT     ENDARTERECTOMY Right 11/05/2016   Procedure: ENDARTERECTOMY RIGHT CAROTID;  Surgeon: Larina EarthlyEarly, Todd F, MD;  [facility];  Service: Vascular;  Laterality: Right;   ENDARTERECTOMY FEMORAL Right 04/29/2021   Procedure: RIGHT ILIOFEMORAL ARTERY ENDARTERECTOMY WITH PROFUNDOPLASTY;  Surgeon: Leonie DouglasHawken, Anaih Brander N, MD;  Location: [facility] OR;  Service: Vascular;  Laterality: Right;    INSERTION OF ILIAC STENT Right 04/29/2021   Procedure: Ronny FlurryAORTAGRAM INSERTION OF BILATERAL COMMON ILIAC ARTERY KISSING STENTS;  Surgeon: Leonie DouglasHawken, Dartha Rozzell N, MD;  [facility];  Service: Vascular;  Laterality: Right;   PATCH ANGIOPLASTY Right 11/05/2016   Procedure: RIGHT CAROTID ARTERY PATCH ANGIOPLASTY USING HEMASHIELD PLATINUM FINESSE PATCH;  Surgeon: Larina EarthlyEarly, Todd F, MD;  Location: [facility] OR;  Service: Vascular;  Laterality: Right;   PATCH ANGIOPLASTY Right 04/29/2021   Procedure: PATCH ANGIOPLASTY OF RIGHT COMMON FEMORAL ARTERY UISNG XENOSURE BOVINE PERICARDIUM PACTH;  Surgeon: Leonie DouglasHawken, Lashelle Koy N, MD;  Location: [facility] OR;  Service: Vascular;  Laterality: Right;   SHOULDER ARTHROSCOPY Right 05/25/2018   Procedure: RIGHT ARTHROSCOPY SHOULDER LABRAL DEBRIDEMENT, rotator cuff debridement,  BICEPS TENOTOMY and subacromial decompression;  Surgeon: Jones Broomhandler, Justin, MD;  Location: [facility] OR;  Service: Orthopedics;  Laterality: Right;   ULTRASOUND GUIDANCE FOR VASCULAR ACCESS Left 04/29/2021   Procedure: ULTRASOUND GUIDANCE FOR VASCULAR ACCESS, LEFT FEMORAL ARTERY;  Surgeon: Leonie DouglasHawken, Exa Bomba N, MD;  Location: [facility] OR;  Service: Vascular;  Laterality: Left;    Family History  Problem Relation Age of Onset   Stroke Mother    Diabetes Father    COPD Father    Arthritis Father    Heart disease Father    Coronary artery disease Unknown        Male 1st degree relative <50   Diabetes Unknown        1st degree relative   Stroke Unknown        Male 1st degree relative<50    Social History   Socioeconomic History   Marital status: Married    Spouse name: Not on file   Number of children: Not on file   Years of education: Not on file   Highest education level: Not on file  Occupational History   Not on file  Tobacco Use   Smoking status: Former    Types: Cigarettes    Quit date: 05/05/2002    Years since quitting: 20.4    Passive exposure: Never   Smokeless tobacco: Never  Vaping Use   Vaping Use: Never used   Substance and Sexual Activity   Alcohol use: Yes    Alcohol/week: 0.0 standard drinks of alcohol    Comment: beer every now and then   Drug use: No   Sexual activity: Yes  Other Topics Concern   Not on file  Social History Narrative   No Regular exercise   Social Determinants of Health   Financial Resource Strain: Low Risk  (07/06/2022)   Overall Financial Resource Strain (CARDIA)    Difficulty of Paying Living Expenses: Not hard at all  Food Insecurity: No Food Insecurity (07/06/2022)   Hunger Vital Sign    Worried About Running Out of Food in the Last Year: Never true    Ran Out of Food in the Last Year: Never true  Transportation Needs: No Transportation Needs (07/06/2022)   PRAPARE - Transportation  Lack of Transportation (Medical): No    Lack of Transportation (Non-Medical): No  Physical Activity: Insufficiently Active (07/06/2022)   Exercise Vital Sign    Days of Exercise per Week: 5 days    Minutes of Exercise per Session: 20 min  Stress: No Stress Concern Present (07/06/2022)   [employer] of Occupational Health - Occupational Stress Questionnaire    Feeling of Stress : Not at all  Social Connections: Socially Integrated (07/06/2022)   Social Connection and Isolation Panel [NHANES]    Frequency of Communication with Friends and Family: More than three times a week    Frequency of Social Gatherings with Friends and Family: Once a week    Attends Religious Services: More than 4 times per year    Active Member of [employer] or Organizations: Yes    Attends Engineer, structural: More than 4 times per year    Marital Status: Married  Catering manager Violence: Not At Risk (07/06/2022)   Humiliation, Afraid, Rape, and Kick questionnaire    Fear of Current or Ex-Partner: No    Emotionally Abused: No    Physically Abused: No    Sexually Abused: No    No Known Allergies  Current Outpatient Medications  Medication Sig Dispense Refill   aspirin 81 MG chewable tablet  Chew 81 mg by mouth daily. Take 1 by mouth daily     b complex vitamins capsule Take 1 capsule by mouth daily.     carvedilol (COREG) 25 MG tablet TAKE 1 TABLET (25 MG TOTAL) BY MOUTH 2 (TWO) TIMES DAILY WITH A MEAL. 180 tablet 3   clopidogrel (PLAVIX) 75 MG tablet Take 1 tablet (75 mg total) by mouth daily. 30 tablet 6   empagliflozin (JARDIANCE) 25 MG TABS tablet Take 25 mg by mouth daily. (Patient taking differently: Take 12.5 mg by mouth daily.) 30 tablet 11   isosorbide dinitrate (ISORDIL) 10 MG tablet Take 10 mg by mouth 3 (three) times daily.     linagliptin (TRADJENTA) 5 MG TABS tablet Take 1 tablet (5 mg total) by mouth daily. 90 tablet 3   metFORMIN (GLUCOPHAGE-XR) 500 MG 24 hr tablet Take 500 mg by mouth in the morning and at bedtime.     pantoprazole (PROTONIX) 40 MG tablet TAKE 1 TABLET (40 MG TOTAL) BY MOUTH DAILY. (Patient taking differently: Take 40 mg by mouth at bedtime.) 90 tablet 0   rosuvastatin (CRESTOR) 20 MG tablet Take 10 mg by mouth daily.     sacubitril-valsartan (ENTRESTO) 97-103 MG Take 1 tablet by mouth 2 (two) times daily.     Semaglutide,0.25 or 0.5MG /DOS, (OZEMPIC, 0.25 OR 0.5 MG/DOSE,) 2 MG/1.5ML SOPN Inject 0.5 mg into the skin once a week. If unavailable, please recommend alternative. 1.5 mL 3   spironolactone (ALDACTONE) 25 MG tablet Take 25 mg by mouth daily.      No current facility-administered medications for this visit.    PHYSICAL EXAM Vitals:   10/12/22 1313 10/12/22 1315  BP: 106/67 108/65  Pulse: 69   Resp: 20   Temp: 97.9 F (36.6 C)   SpO2: 97%   Weight: 219 lb (99.3 kg)   Height: 6' (1.829 m)     Well-appearing gentleman in no acute distress Regular rate and rhythm Unlabored breathing No focal neurologic signs or symptoms.  PERTINENT LABORATORY AND RADIOLOGIC DATA  Most recent CBC    Latest Ref Rng & Units 04/30/2021   12:41 AM 04/28/2021    9:14 AM 05/22/2018   10:49  AM  CBC  WBC 4.0 - 10.5 K/uL 12.2  5.2  7.9   Hemoglobin  13.0 - 17.0 g/dL 74.2  59.5  63.8   Hematocrit 39.0 - 52.0 % 38.0  45.1  45.9   Platelets 150 - 400 K/uL 177  188  192      Most recent CMP    Latest Ref Rng & Units 09/29/2021    3:29 PM 04/30/2021   12:41 AM 04/28/2021    9:14 AM  CMP  Glucose 70 - 99 mg/dL 756  433  295   BUN 6 - 23 mg/dL 15  15  13    Creatinine 0.40 - 1.50 mg/dL 1.88  4.16  6.06   Sodium 135 - 145 mEq/L 137  134  133   Potassium 3.5 - 5.1 mEq/L 4.0  4.2  4.1   Chloride 96 - 112 mEq/L 103  103  103   CO2 19 - 32 mEq/L 26  21  18    Calcium 8.4 - 10.5 mg/dL 9.7  9.0  9.2   Total Protein 6.0 - 8.3 g/dL 7.5   7.0   Total Bilirubin 0.2 - 1.2 mg/dL 0.6   1.0   Alkaline Phos 39 - 117 U/L 44   39   AST 0 - 37 U/L 15   26   ALT 0 - 53 U/L 20   30     Renal function CrCl cannot be calculated (Patient's most recent lab result is older than the maximum 21 days allowed.).  Hgb A1c MFr Bld (%)  Date Value  09/29/2021 7.2 (H)    LDL Cholesterol  Date Value Ref Range Status  04/30/2021 174 (H) 0 - 99 mg/dL Final    Comment:           Total Cholesterol/HDL:CHD Risk Coronary Heart Disease Risk Table                     Men   Women  1/2 Average Risk   3.4   3.3  Average Risk       5.0   4.4  2 X Average Risk   9.6   7.1  3 X Average Risk  23.4   11.0        Use the calculated Patient Ratio above and the CHD Risk Table to determine the patient's CHD Risk.        ATP III CLASSIFICATION (LDL):  <100     mg/dL   Optimal  301-601  mg/dL   Near or Above                    Optimal  130-159  mg/dL   Borderline  093-235  mg/dL   High  >573     mg/dL   Very High Performed at [facility], [address], [place], [place] [address]    Direct LDL  Date Value Ref Range Status  02/08/2013 198.1 mg/dL Final    Comment:    Optimal:  <100 mg/dLNear or Above Optimal:  100-129 mg/dLBorderline High:  130-159 mg/dLHigh:  160-189 mg/dLVery High:  >190 mg/dL    VA workup reviewed in detail. Right carotid artery  stenosis demonstrated on duplex and CT angiogram.  Images not available for me to review.  Rande Brunt. Lenell Antu, MD FACS [facility] Phone Number: [phone] 10/12/2022 6:59 PM   Total time spent on preparing this encounter including chart reestablished  patient, 40 minutes.view, data review, collecting history, examining the patient, coordinating care for this established patient, 40 minutes.  Portions of this report may have been transcribed using voice recognition software.  Every effort has been made to ensure accuracy; however, inadvertent computerized transcription errors may still be present.

## 2022-10-11 NOTE — H&P (View-Only) (Signed)
[facility] [place]  ASSESSMENT / PLAN: [name] is a 67 y.o. male with symptomatic right 60 - 79 % carotid artery stenosis.   The patient should continue best medical therapy for carotid artery stenosis including: Complete cessation from all tobacco products. Blood glucose control with goal A1c < 7%. Blood pressure control with goal blood pressure < 140/90 mmHg. Lipid reduction therapy with goal LDL-C <70  Aspirin 81mg PO QD.  Clopidogrel 75mg PO QD. Atorvastatin 40-80mg PO QD (or other "high intensity" statin therapy).  [facility]  CHIEF COMPLAINT: Visual disturbance  HISTORY OF PRESENT ILLNESS: [name] is a 67 y.o. male who returns to clinic for evaluation of carotid artery stenosis.  The patient is well-known to me for revascularization efforts in his right leg which were successful to resolve rest pain.  See below for full detail.  The patient describes visual disturbances which [facility]  A funduscopic exam was performed which was worrisome, per the patient's report.  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  This prompted evaluation for symptomatic carotid artery stenosis.  The patient is status post carotid endarterectomy by my partner Dr. Early in 2018.  He did very well in recovery.  He has been undergoing surveillance.  No early restenosis was noted.  Evaluation was worrisome for restenosis of the right internal carotid artery which had been endarterectomized.  A CT angiogram was performed which revealed about 85% stenosis of the origin of the right internal carotid artery.  I reviewed these findings in detail with the patient.  The [facility] is a bit incomplete.  I was able to review the radiographic reports, as detailed above.  I was not able to review the CT angiogram images personally.  I counseled him about the 2 modes of treatment for  symptomatic carotid artery stenosis.  I encouraged him to initiate clopidogrel therapy today.  Ideally, we would treat his recurrent stenosis with a stent procedure to reduce his risk of cranial nerve injury.  He is understanding and willing to proceed.  VASCULAR SURGICAL HISTORY:  Bilateral common iliac "kissing" angioplasty and stenting (RCIA 11x79mm VBX, LCIA 10x39mm VBX) and right iliofemoral endarterectomy and profundaplasty with bovine pericardial patch angioplasty 04/29/21.  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 Greg Hays   Sleep disorder    Dr Clance, lose weight and consider sleep study later   Sleep disorder    TIA (transient ischemic attack)    by history     Volume overload     Past Surgical History:  Procedure Laterality Date   CHOLECYSTECTOMY     COLONOSCOPY  03/19/2009   CORONARY ARTERY BYPASS GRAFT     ENDARTERECTOMY Right 11/05/2016   Procedure: ENDARTERECTOMY RIGHT CAROTID;  Surgeon: Early, Todd F, MD;  [facility];  Service: Vascular;  Laterality: Right;   ENDARTERECTOMY FEMORAL Right 04/29/2021   Procedure: RIGHT ILIOFEMORAL ARTERY ENDARTERECTOMY WITH PROFUNDOPLASTY;  Surgeon: Maggy Wyble N, MD;  Location: [facility] OR;  Service: Vascular;  Laterality: Right;    INSERTION OF ILIAC STENT Right 04/29/2021   Procedure: AORTAGRAM INSERTION OF BILATERAL COMMON ILIAC ARTERY KISSING STENTS;  Surgeon: Teddrick Mallari N, MD;  Location: [facility] OR;  Service: Vascular;  Laterality: Right;   PATCH ANGIOPLASTY Right 11/05/2016   Procedure: RIGHT CAROTID ARTERY PATCH ANGIOPLASTY USING HEMASHIELD PLATINUM FINESSE PATCH;  Surgeon: Early, Todd F, MD;  Location: [facility];  Service: Vascular;  Laterality: Right;   PATCH ANGIOPLASTY Right 04/29/2021   Procedure: PATCH ANGIOPLASTY OF RIGHT COMMON FEMORAL ARTERY UISNG XENOSURE BOVINE PERICARDIUM PACTH;  Surgeon: Nazariah Cadet N, MD;  Location: [facility];  Service: Vascular;  Laterality: Right;   SHOULDER ARTHROSCOPY Right 05/25/2018   Procedure: RIGHT ARTHROSCOPY SHOULDER LABRAL DEBRIDEMENT, rotator cuff debridement,  BICEPS TENOTOMY and subacromial decompression;  Surgeon: Chandler, Justin, MD;  Location: [facility];  Service: Orthopedics;  Laterality: Right;   ULTRASOUND GUIDANCE FOR VASCULAR ACCESS Left 04/29/2021   Procedure: ULTRASOUND GUIDANCE FOR VASCULAR ACCESS, LEFT FEMORAL ARTERY;  Surgeon: Seab Axel N, MD;  Location: [facility];  Service: Vascular;  Laterality: Left;    Family History  Problem Relation Age of Onset   Stroke Mother    Diabetes Father    COPD Father    Arthritis Father    Heart disease Father    Coronary artery disease Unknown        Male 1st degree relative <50   Diabetes Unknown        1st degree relative   Stroke Unknown        Male 1st degree relative<50    Social History   Socioeconomic History   Marital status: Married    Spouse name: Not on file   Number of children: Not on file   Years of education: Not on file   Highest education level: Not on file  Occupational History   Not on file  Tobacco Use   Smoking status: Former    Types: Cigarettes    Quit date: 05/05/2002    Years since quitting: 20.4    Passive exposure: Never   Smokeless tobacco: Never  Vaping Use   Vaping Use: Never used   Substance and Sexual Activity   Alcohol use: Yes    Alcohol/week: 0.0 standard drinks of alcohol    Comment: beer every now and then   Drug use: No   Sexual activity: Yes  Other Topics Concern   Not on file  Social History Narrative   No Regular exercise   Social Determinants of Health   Financial Resource Strain: Low Risk  (07/06/2022)   Overall Financial Resource Strain (CARDIA)    Difficulty of Paying Living Expenses: Not hard at all  Food Insecurity: No Food Insecurity (07/06/2022)   Hunger Vital Sign    Worried About Running Out of Food in the Last Year: Never true    Ran Out of Food in the Last Year: Never true  Transportation Needs: No Transportation Needs (07/06/2022)   PRAPARE - Transportation      Lack of Transportation (Medical): No    Lack of Transportation (Non-Medical): No  Physical Activity: Insufficiently Active (07/06/2022)   Exercise Vital Sign    Days of Exercise per Week: 5 days    Minutes of Exercise per Session: 20 min  Stress: No Stress Concern Present (07/06/2022)   [employer] - Occupational Stress Questionnaire    Feeling of Stress : Not at all  Social Connections: Socially Integrated (07/06/2022)   Social Connection and Isolation Panel [NHANES]    Frequency of Communication with Friends and Family: More than three times a week    Frequency of Social Gatherings with Friends and Family: Once a week    Attends Religious Services: More than 4 times per year    Active Member of Clubs or Organizations: Yes    Attends Club or Organization Meetings: More than 4 times per year    Marital Status: Married  Intimate Partner Violence: Not At Risk (07/06/2022)   Humiliation, Afraid, Rape, and Kick questionnaire    Fear of Current or Ex-Partner: No    Emotionally Abused: No    Physically Abused: No    Sexually Abused: No    No Known Allergies  Current Outpatient Medications  Medication Sig Dispense Refill   aspirin 81 MG chewable tablet  Chew 81 mg by mouth daily. Take 1 by mouth daily     b complex vitamins capsule Take 1 capsule by mouth daily.     carvedilol (COREG) 25 MG tablet TAKE 1 TABLET (25 MG TOTAL) BY MOUTH 2 (TWO) TIMES DAILY WITH A MEAL. 180 tablet 3   clopidogrel (PLAVIX) 75 MG tablet Take 1 tablet (75 mg total) by mouth daily. 30 tablet 6   empagliflozin (JARDIANCE) 25 MG TABS tablet Take 25 mg by mouth daily. (Patient taking differently: Take 12.5 mg by mouth daily.) 30 tablet 11   isosorbide dinitrate (ISORDIL) 10 MG tablet Take 10 mg by mouth 3 (three) times daily.     linagliptin (TRADJENTA) 5 MG TABS tablet Take 1 tablet (5 mg total) by mouth daily. 90 tablet 3   metFORMIN (GLUCOPHAGE-XR) 500 MG 24 hr tablet Take 500 mg by mouth in the morning and at bedtime.     pantoprazole (PROTONIX) 40 MG tablet TAKE 1 TABLET (40 MG TOTAL) BY MOUTH DAILY. (Patient taking differently: Take 40 mg by mouth at bedtime.) 90 tablet 0   rosuvastatin (CRESTOR) 20 MG tablet Take 10 mg by mouth daily.     sacubitril-valsartan (ENTRESTO) 97-103 MG Take 1 tablet by mouth 2 (two) times daily.     Semaglutide,0.25 or 0.5MG/DOS, (OZEMPIC, 0.25 OR 0.5 MG/DOSE,) 2 MG/1.5ML SOPN Inject 0.5 mg into the skin once a week. If unavailable, please recommend alternative. 1.5 mL 3   spironolactone (ALDACTONE) 25 MG tablet Take 25 mg by mouth daily.      No current facility-administered medications for this visit.    PHYSICAL EXAM Vitals:   10/12/22 1313 10/12/22 1315  BP: 106/67 108/65  Pulse: 69   Resp: 20   Temp: 97.9 F (36.6 C)   SpO2: 97%   Weight: 219 lb (99.3 kg)   Height: 6' (1.829 m)     Well-appearing gentleman in no acute distress Regular rate and rhythm Unlabored breathing No focal neurologic signs or symptoms.  PERTINENT LABORATORY AND RADIOLOGIC DATA  Most recent CBC    Latest Ref Rng & Units 04/30/2021   12:41 AM 04/28/2021    9:14 AM 05/22/2018   10:49   AM  CBC  WBC 4.0 - 10.5 K/uL 12.2  5.2  7.9   Hemoglobin  13.0 - 17.0 g/dL 13.1  15.2  15.1   Hematocrit 39.0 - 52.0 % 38.0  45.1  45.9   Platelets 150 - 400 K/uL 177  188  192      Most recent CMP    Latest Ref Rng & Units 09/29/2021    3:29 PM 04/30/2021   12:41 AM 04/28/2021    9:14 AM  CMP  Glucose 70 - 99 mg/dL 100  185  178   BUN 6 - 23 mg/dL 15  15  13   Creatinine 0.40 - 1.50 mg/dL 1.04  1.17  1.14   Sodium 135 - 145 mEq/L 137  134  133   Potassium 3.5 - 5.1 mEq/L 4.0  4.2  4.1   Chloride 96 - 112 mEq/L 103  103  103   CO2 19 - 32 mEq/L 26  21  18   Calcium 8.4 - 10.5 mg/dL 9.7  9.0  9.2   Total Protein 6.0 - 8.3 g/dL 7.5   7.0   Total Bilirubin 0.2 - 1.2 mg/dL 0.6   1.0   Alkaline Phos 39 - 117 U/L 44   39   AST 0 - 37 U/L 15   26   ALT 0 - 53 U/L 20   30     Renal function CrCl cannot be calculated (Patient's most recent lab result is older than the maximum 21 days allowed.).  Hgb A1c MFr Bld (%)  Date Value  09/29/2021 7.2 (H)    LDL Cholesterol  Date Value Ref Range Status  04/30/2021 174 (H) 0 - 99 mg/dL Final    Comment:           Total Cholesterol/HDL:CHD Risk Coronary Heart Disease Risk Table                     Men   Women  1/2 Average Risk   3.4   3.3  Average Risk       5.0   4.4  2 X Average Risk   9.6   7.1  3 X Average Risk  23.4   11.0        Use the calculated Patient Ratio above and the CHD Risk Table to determine the patient's CHD Risk.        ATP III CLASSIFICATION (LDL):  <100     mg/dL   Optimal  100-129  mg/dL   Near or Above                    Optimal  130-159  mg/dL   Borderline  160-189  mg/dL   High  >190     mg/dL   Very High Performed at [facility], [address] [place], [place] [address]    Direct LDL  Date Value Ref Range Status  02/08/2013 198.1 mg/dL Final    Comment:    Optimal:  <100 mg/dLNear or Above Optimal:  100-129 mg/dLBorderline High:  130-159 mg/dLHigh:  160-189 mg/dLVery High:  >190 mg/dL    [facility] reviewed in detail. Right carotid artery  stenosis demonstrated on duplex and CT angiogram.  Images not available for me to review.  Krystl Wickware N. Meeah Totino, MD FACS [facility] [place] Office Phone Number: [phone] 10/12/2022 6:59 PM   Total time spent on preparing this encounter including chart reestablished   patient, 40 minutes.view, data review, collecting history, examining the patient, coordinating care for this established patient, 40 minutes.  Portions of this report may have been transcribed using voice recognition software.  Every effort has been made to ensure accuracy; however, inadvertent computerized transcription errors may still be present.    

## 2022-10-12 ENCOUNTER — Encounter: Payer: Self-pay | Admitting: Vascular Surgery

## 2022-10-12 ENCOUNTER — Ambulatory Visit (INDEPENDENT_AMBULATORY_CARE_PROVIDER_SITE_OTHER): Payer: Medicare Other | Admitting: Vascular Surgery

## 2022-10-12 VITALS — BP 108/65 | HR 69 | Temp 97.9°F | Resp 20 | Ht 72.0 in | Wt 219.0 lb

## 2022-10-12 DIAGNOSIS — I6521 Occlusion and stenosis of right carotid artery: Secondary | ICD-10-CM

## 2022-10-12 MED ORDER — CLOPIDOGREL BISULFATE 75 MG PO TABS: 75.0000 mg | ORAL_TABLET | Freq: Every day | ORAL | 6 refills | Status: DC

## 2022-10-19 ENCOUNTER — Ambulatory Visit (INDEPENDENT_AMBULATORY_CARE_PROVIDER_SITE_OTHER): Payer: Self-pay | Admitting: Vascular Surgery

## 2022-10-19 DIAGNOSIS — I6521 Occlusion and stenosis of right carotid artery: Secondary | ICD-10-CM

## 2022-10-19 NOTE — Progress Notes (Signed)
Reviewed CT angiogram with patient in detail. I think his anatomy is favorable for a TCAR. TCAR is his best option because of contralateral occlusion and prior R CEA.  I have completed Share Decision Making with [name] prior to surgery.  Conversations included: -Discussion of all treatment options including carotid endarterectomy (CEA), CAS (which includes transcarotid artery revascularization (TCAR)), and optimal medical therapy (OMT)). -Explanation of risks and benefits for each option specific to Lollie Sails B [place]'s clinical situation. -Integration of clinical guidelines as it relates to the patient's history and co-morbidities -Discussion and incorporation of TABIUS ROOD and their personal preferences and priorities in choosing a treatment plan.  Plan R TCAR as soon as schedule allows. Patient is already on DAPT.  Rande Brunt. Lenell Antu, MD [facility] Phone Number: [phone] 10/19/2022 4:17 PM

## 2022-10-20 ENCOUNTER — Other Ambulatory Visit: Payer: Self-pay

## 2022-10-20 DIAGNOSIS — I6521 Occlusion and stenosis of right carotid artery: Secondary | ICD-10-CM

## 2022-10-21 NOTE — Progress Notes (Signed)
Surgical Instructions    Your procedure is scheduled on Thursday, 10/28/22.  Report to [facility] Main Entrance "A" at 7:40 A.M., then check in with the Admitting office.  Call this number if you have problems the morning of surgery:  [phone]   If you have any questions prior to your surgery date call [phone]: Open Monday-Friday 8am-4pm If you experience any cold or flu symptoms such as cough, fever, chills, shortness of breath, etc. between now and your scheduled surgery, please notify us at the above number     Remember:  Do not eat or drink after midnight the night before your surgery     Take these medicines the morning of surgery with A SIP OF WATER:  carvedilol (COREG)  aspirin  clopidogrel (PLAVIX)  isosorbide dinitrate (ISORDIL)  rosuvastatin (CRESTOR)    As of today, STOP taking any (unless otherwise instructed by your surgeon) Aleve, Naproxen, Ibuprofen, Motrin, Advil, Goody's, BC's, all herbal medications, fish oil, diclofenac Sodium (VOLTAREN) gel and all vitamins.  WHAT DO I DO ABOUT MY DIABETES MEDICATION?   Do not take oral diabetes medicines (pills) the morning of surgery.  Do not take empagliflozin (JARDIANCE) 72 hours prior to surgery. Your last dose will be 10/24/22.  Do not take Semaglutide,0.25 or 0.5MG /DOS, (OZEMPIC, 0.25 OR 0.5 MG/DOSE,) 7 days prior to surgery. Do not take a dose after 10/20/22.  The day of surgery, do not take other diabetes injectables, including Byetta (exenatide), Bydureon (exenatide ER), Victoza (liraglutide), or Trulicity (dulaglutide).  If your CBG is greater than 220 mg/dL, you may take  of your sliding scale (correction) dose of insulin.   HOW TO MANAGE YOUR DIABETES BEFORE AND AFTER SURGERY  Why is it important to control my blood sugar before and after surgery? Improving blood sugar levels before and after surgery helps healing and can limit problems. A way of improving blood sugar control is eating a healthy  diet by:  Eating less sugar and carbohydrates  Increasing activity/exercise  Talking with your doctor about reaching your blood sugar goals High blood sugars (greater than 180 mg/dL) can raise your risk of infections and slow your recovery, so you will need to focus on controlling your diabetes during the weeks before surgery. Make sure that the doctor who takes care of your diabetes knows about your planned surgery including the date and location.  How do I manage my blood sugar before surgery? Check your blood sugar at least 4 times a day, starting 2 days before surgery, to make sure that the level is not too high or low.  Check your blood sugar the morning of your surgery when you wake up and every 2 hours until you get to the Short Stay unit.  If your blood sugar is less than 70 mg/dL, you will need to treat for low blood sugar: Do not take insulin. Treat a low blood sugar (less than 70 mg/dL) with  cup of clear juice (cranberry or apple), 4 glucose tablets, OR glucose gel. Recheck blood sugar in 15 minutes after treatment (to make sure it is greater than 70 mg/dL). If your blood sugar is not greater than 70 mg/dL on recheck, call [phone] for further instructions. Report your blood sugar to the short stay nurse when you get to Short Stay.  If you are admitted to the hospital after surgery: Your blood sugar will be checked by the staff and you will probably be given insulin after surgery (instead of oral diabetes medicines) to make  sure you have good blood sugar levels. The goal for blood sugar control after surgery is 80-180 mg/dL.            Do not wear jewelry or makeup. Do not wear lotions, powders, cologne or deodorant. Men may shave face and neck. Do not bring valuables to the hospital. Do not wear nail polish, gel polish, artificial nails, or any other type of covering on natural nails (fingers and toes) If you have artificial nails or gel coating that need to be removed  by a nail salon, please have this removed prior to surgery. Artificial nails or gel coating may interfere with anesthesia's ability to adequately monitor your vital signs.  [facility] is not responsible for any belongings or valuables.    Do NOT Smoke (Tobacco/Vaping)  24 hours prior to your procedure  If you use a CPAP at night, you may bring your mask for your overnight stay.   Contacts, glasses, hearing aids, dentures or partials may not be worn into surgery, please bring cases for these belongings   For patients admitted to the hospital, discharge time will be determined by your treatment team.   Patients discharged the day of surgery will not be allowed to drive home, and someone needs to stay with them for 24 hours.   SURGICAL WAITING ROOM VISITATION Patients having surgery or a procedure may have no more than 2 support people in the waiting area - these visitors may rotate.   Children under the age of 59 must have an adult with them who is not the patient. If the patient needs to stay at the hospital during part of their recovery, the visitor guidelines for inpatient rooms apply. Pre-op nurse will coordinate an appropriate time for 1 support person to accompany patient in pre-op.  This support person may not rotate.   Please refer to [email or web address] for the visitor guidelines for Inpatients (after your surgery is over and you are in a regular room).    Special instructions:    Oral Hygiene is also important to reduce your risk of infection.  Remember - BRUSH YOUR TEETH THE MORNING OF SURGERY WITH YOUR REGULAR TOOTHPASTE   [place]- Preparing For Surgery  Before surgery, you can play an important role. Because skin is not sterile, your skin needs to be as free of germs as possible. You can reduce the number of germs on your skin by washing with CHG (chlorahexidine gluconate) Soap before surgery.  CHG is an antiseptic  cleaner which kills germs and bonds with the skin to continue killing germs even after washing.     Please do not use if you have an allergy to CHG or antibacterial soaps. If your skin becomes reddened/irritated stop using the CHG.  Do not shave (including legs and underarms) for at least 48 hours prior to first CHG shower. It is OK to shave your face.  Please follow these instructions carefully.     Shower the NIGHT BEFORE SURGERY and the MORNING OF SURGERY with CHG Soap.   If you chose to wash your hair, wash your hair first as usual with your normal shampoo. After you shampoo, rinse your hair and body thoroughly to remove the shampoo.  Then [employer] and genitals (private parts) with your normal soap and rinse thoroughly to remove soap.  After that Use CHG Soap as you would any other liquid soap. You can apply CHG directly to the skin and wash gently with a scrungie or a clean  washcloth.   Apply the CHG Soap to your body ONLY FROM THE NECK DOWN.  Do not use on open wounds or open sores. Avoid contact with your eyes, ears, mouth and genitals (private parts). Wash Face and genitals (private parts)  with your normal soap.   Wash thoroughly, paying special attention to the area where your surgery will be performed.  Thoroughly rinse your body with warm water from the neck down.  DO NOT shower/wash with your normal soap after using and rinsing off the CHG Soap.  Pat yourself dry with a CLEAN TOWEL.  Wear CLEAN PAJAMAS to bed the night before surgery  Place CLEAN SHEETS on your bed the night before your surgery  DO NOT SLEEP WITH PETS.   Day of Surgery: Take a shower with CHG soap. Wear Clean/Comfortable clothing the morning of surgery Do not apply any deodorants/lotions.   Remember to brush your teeth WITH YOUR REGULAR TOOTHPASTE.    If you received a COVID test during your pre-op visit, it is requested that you wear a mask when out in public, stay away from anyone that may not  be feeling well, and notify your surgeon if you develop symptoms. If you have been in contact with anyone that has tested positive in the last 10 days, please notify your surgeon.    Please read over the following fact sheets that you were given.

## 2022-10-22 ENCOUNTER — Encounter (HOSPITAL_COMMUNITY)
Admission: RE | Admit: 2022-10-22 | Discharge: 2022-10-22 | Disposition: A | Discharge status: Home / Self Care | Payer: Medicare Other | Source: Ambulatory Visit | Attending: Vascular Surgery | Admitting: Vascular Surgery

## 2022-10-22 ENCOUNTER — Other Ambulatory Visit: Payer: Self-pay

## 2022-10-22 ENCOUNTER — Encounter (HOSPITAL_COMMUNITY): Payer: Self-pay

## 2022-10-22 VITALS — BP 97/57 | HR 69 | Temp 97.5°F | Resp 17 | Ht 72.0 in | Wt 219.0 lb

## 2022-10-22 DIAGNOSIS — I6521 Occlusion and stenosis of right carotid artery: Secondary | ICD-10-CM

## 2022-10-22 DIAGNOSIS — I110 Hypertensive heart disease with heart failure: Secondary | ICD-10-CM | POA: Diagnosis not present

## 2022-10-22 DIAGNOSIS — I2510 Atherosclerotic heart disease of native coronary artery without angina pectoris: Secondary | ICD-10-CM | POA: Diagnosis not present

## 2022-10-22 DIAGNOSIS — I739 Peripheral vascular disease, unspecified: Secondary | ICD-10-CM | POA: Diagnosis not present

## 2022-10-22 DIAGNOSIS — Z01818 Encounter for other preprocedural examination: Secondary | ICD-10-CM | POA: Diagnosis present

## 2022-10-22 DIAGNOSIS — I6523 Occlusion and stenosis of bilateral carotid arteries: Secondary | ICD-10-CM | POA: Diagnosis not present

## 2022-10-22 DIAGNOSIS — I5020 Unspecified systolic (congestive) heart failure: Secondary | ICD-10-CM | POA: Diagnosis not present

## 2022-10-22 HISTORY — DX: Heart failure, unspecified: I50.9

## 2022-10-22 HISTORY — DX: Other complications of anesthesia, initial encounter: T88.59XA

## 2022-10-22 HISTORY — DX: Concussion with loss of consciousness status unknown, initial encounter: S06.0XAA

## 2022-10-22 LAB — URINALYSIS, ROUTINE W REFLEX MICROSCOPIC
Bacteria, UA: NONE SEEN
Bilirubin Urine: NEGATIVE
Glucose, UA: >=500 mg/dL — AB
Hgb urine dipstick: NEGATIVE
Ketones, ur: NEGATIVE mg/dL
Leukocytes,Ua: NEGATIVE
Nitrite: NEGATIVE
Protein, ur: NEGATIVE mg/dL
Specific Gravity, Urine: 1.024 (ref 1.005–1.030)
pH: 5 (ref 5.0–8.0)

## 2022-10-22 LAB — SURGICAL PCR SCREEN
MRSA, PCR: NEGATIVE
Staphylococcus aureus: NEGATIVE

## 2022-10-22 LAB — COMPREHENSIVE METABOLIC PANEL
ALT: 25 U/L (ref 0–44)
AST: 21 U/L (ref 15–41)
Albumin: 4 g/dL (ref 3.5–5.0)
Alkaline Phosphatase: 47 U/L (ref 38–126)
Anion gap: 7 (ref 5–15)
BUN: 15 mg/dL (ref 8–23)
CO2: 26 mmol/L (ref 22–32)
Calcium: 9.2 mg/dL (ref 8.9–10.3)
Chloride: 105 mmol/L (ref 98–111)
Creatinine, Ser: 1.07 mg/dL (ref 0.61–1.24)
GFR, Estimated: >60 mL/min (ref >60)
Glucose, Bld: 150 mg/dL — ABNORMAL HIGH (ref 70–99)
Potassium: 4.4 mmol/L (ref 3.5–5.1)
Sodium: 138 mmol/L (ref 135–145)
Total Bilirubin: 0.9 mg/dL (ref 0.3–1.2)
Total Protein: 7.1 g/dL (ref 6.5–8.1)

## 2022-10-22 LAB — TYPE AND SCREEN
ABO/RH(D): A POS
Antibody Screen: NEGATIVE

## 2022-10-22 LAB — PROTIME-INR
INR: 1.1 (ref 0.8–1.2)
Prothrombin Time: 13.9 seconds (ref 11.4–15.2)

## 2022-10-22 LAB — CBC
HCT: 48.5 % (ref 39.0–52.0)
Hemoglobin: 16.2 g/dL (ref 13.0–17.0)
MCH: 32.5 pg (ref 26.0–34.0)
MCHC: 33.4 g/dL (ref 30.0–36.0)
MCV: 97.2 fL (ref 80.0–100.0)
Platelets: 183 10*3/uL (ref 150–400)
RBC: 4.99 MIL/uL (ref 4.22–5.81)
RDW: 12.2 % (ref 11.5–15.5)
WBC: 7.5 10*3/uL (ref 4.0–10.5)
nRBC: 0 % (ref 0.0–0.2)

## 2022-10-22 LAB — APTT: aPTT: 35 seconds (ref 24–36)

## 2022-10-22 LAB — GLUCOSE, CAPILLARY: Glucose-Capillary: 170 mg/dL — ABNORMAL HIGH (ref 70–99)

## 2022-10-22 NOTE — Progress Notes (Addendum)
PCP - Dr A. Plonikov  [facility] - [facility]  EP-no  Endocrine-no  Pulm-no  Chest x-ray - na  EKG - 10/22/22  Stress Test - 2018  ECHO - 2021  Cardiac Cath -   AICD-no PM-no LOOP-no  Nerve Stimulator-no  Dialysis-no  Sleep Study - no CPAP - no  LABS-CBC, CMP, PT, PTT, T/S, UA, PCR  ASA-continue Plavix continue  ERAS-na Hemoglobin A1C- 6.8- 08/03/22- [facility] Fasting Blood Sugar - 150 Checks Blood Sugar __1___ times a day  Anesthesia- Mr. Patil denies any s/s of upper or lower respiratory in the past 8 weeks.  Pt denies having chest pain, sob, or fever at this time. All instructions explained to the pt, with a verbal understanding of the material. Pt agrees to go over the instructions while at home for a better understanding. Pt also instructed to self quarantine after being tested for COVID-19. The opportunity to ask questions was provided.

## 2022-10-25 NOTE — Anesthesia Preprocedure Evaluation (Signed)
Anesthesia Evaluation  Patient identified by MRN, date of birth, ID band Patient awake    Reviewed: Allergy & Precautions, NPO status , Patient's Chart, lab work & pertinent test results  History of Anesthesia Complications Negative for: history of anesthetic complications  Airway Mallampati: III  TM Distance: >3 FB Neck ROM: Full    Dental no notable dental hx. (+) Dental Advisory Given   Pulmonary former smoker   Pulmonary exam normal        Cardiovascular hypertension, Pt. on home beta blockers (-) angina + CAD, + Past MI, + CABG and +CHF  Normal cardiovascular exam  Transthoracic echocardiogram 08/09/2019 (Care Everywhere): Transthoracic echocardiogram 09/07/2021 There is mild concentric left ventricular hypertrophy. The left ventricle is mildly dilated. Left ventricular systolic function is moderately reduced. EF= 43% by 2D biplane method. Inferior wall severe hypokinesis. Septal motion is consistent with post-operative state. Inferoseptal wall moderate hypokinesis. Grade I diastolic dysfunction, (abnormal relaxation pattern). There is mild aortic sclerosis. No hemodynamically significant valvular aortic stenosis. There is trace to mild mitral regurgitation. There is trace tricuspid regurgitation. Right ventricular systolic pressure is estimated at 30 mmHg. Compared to the prior echo report on 09/06/2019, there is no significant  change.    Nuclear stress 11/03/2016:  Nuclear stress EF: 27%.  Blood pressure demonstrated a normal response to exercise.  There was no ST segment deviation noted during stress.  Findings consistent with prior myocardial infarction.  This is a high risk study.  The left ventricular ejection fraction is severely decreased (<30%).   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 no ischemia EF 27%      Neuro/Psych TIA   GI/Hepatic negative GI ROS, Neg liver ROS,,,  Endo/Other   diabetes    Renal/GU negative Renal ROS     Musculoskeletal negative musculoskeletal ROS (+)    Abdominal   Peds  Hematology negative hematology ROS (+)   Anesthesia Other Findings   Reproductive/Obstetrics                             Anesthesia Physical Anesthesia Plan  ASA: 4  Anesthesia Plan: General   Post-op Pain Management: Tylenol PO (pre-op)* and Minimal or no pain anticipated   Induction: Intravenous  PONV Risk Score and Plan: 3 and Ondansetron, Dexamethasone and Diphenhydramine  Airway Management Planned: Oral ETT  Additional Equipment: Arterial line  Intra-op Plan:   Post-operative Plan: Extubation in OR  Informed Consent: I have reviewed the patients History and Physical, chart, labs and discussed the procedure including the risks, benefits and alternatives for the proposed anesthesia with the patient or authorized representative who has indicated his/her understanding and acceptance.       Plan Discussed with: Anesthesiologist and CRNA  Anesthesia Plan Comments: (PAT note by Antionette Poles, PA-C: Hx of CAD s/p remote CABG x 4 2003, HFrEF (with improvement in EF to 40-45%), HTN, PVD s/p prior R CEA. Now followed by cardiology at the [facility]. Last seen 08/13/22 and stable at that time, clinically asymptomatic, euvolemic. Followup carotid dopplers were ordered. Test done 08/20/22 showed RICA >70% and CTO of the LICA. He was subsequently referred back to vascular surgery.  Pt known to Dr. Lenell Antu as he previously performed bilateral common iliac "kissing" angioplasty and stenting and right iliofemoral endarterectomy and profundaplasty with bovine pericardial patch angioplasty on 04/29/21. 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   History of  NIDDM2, last A1c 6.8  on 08/03/22. Pt on weekly GLP-1 agonist. LD reported 10/16/22.  Preop labs reviewed, unremarkable.  EKG 04/28/21: Sinus bradycardia.   Rate 58.  Possible Inferior infarct , age undetermined. Possible Anterior infarct , age undetermined. T wave inversions noted in lead I and aVL, which have been noted on prior EKGs as well.  Carotid Duplex 08/20/22 (care everywhere): Impression: There are no prior studies for comparison 1. There is focal plaquing both calcified and noncalcified in the carotid bulb and the right ICA with marked narrowing suggestion of admixture of flow focally. The degree of stenosis appears to on grayscale images greater than the corresponding velocities and peak systolic velocities may not reflect the degree of ICA stenosis. CTA examination of the neck would be recommended. Based on the velocity criteria there is greater than 70% stenosis at the proximal right ICA noted with prior endarterectomy performed on this side. Vascular surgery consultation would also be  recommended   2. 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 The peak systolic velocities  within the left ICA are noted to be low for the degree of  grayscale stenosis and the degree of stenosis based on  velocities may be under estimated on this exam. CTA examination  of the neck would be recommended to further evaluate as well as  vascular surgery   Transthoracic echocardiogram 08/09/2019 (Care Everywhere): Transthoracic echocardiogram 09/07/2021 There is mild concentric left ventricular hypertrophy. The left ventricle is mildly dilated. Left ventricular systolic function is moderately reduced. EF= 43% by 2D biplane method. Inferior wall severe hypokinesis. Septal motion is consistent with post-operative state. Inferoseptal wall moderate hypokinesis. Grade I diastolic dysfunction, (abnormal relaxation pattern). There is mild aortic sclerosis. No hemodynamically significant valvular aortic stenosis. There is trace to  mild mitral regurgitation. There is trace tricuspid regurgitation. Right ventricular systolic pressure is estimated at 30 mmHg. Compared to the prior echo report on 09/06/2019, there is no significant  change.   Nuclear stress 11/03/2016: ? Nuclear stress EF: 27%. ? Blood pressure demonstrated a normal response to exercise. ? There was no ST segment deviation noted during stress. ? Findings consistent with prior myocardial infarction. ? This is a high risk study. ? The left ventricular ejection fraction is severely decreased (<30%).  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

## 2022-10-25 NOTE — Progress Notes (Signed)
Anesthesia Chart Review:  Hx of CAD s/p remote CABG x 4 2003, HFrEF (with improvement in EF to 40-45%), HTN, PVD s/p prior R CEA. Now followed by cardiology at the [facility]. Last seen 08/13/22 and stable at that time, clinically asymptomatic, euvolemic. Followup carotid dopplers were ordered. Test done 08/20/22 showed RICA >70% and CTO of the LICA. He was subsequently referred back to vascular surgery.  Pt known to Dr. Lenell Antu as he previously performed bilateral common iliac "kissing" angioplasty and stenting and right iliofemoral endarterectomy and profundaplasty with bovine pericardial patch angioplasty on 04/29/21. 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    History of NIDDM2, last A1c 6.8  on 08/03/22. Pt on weekly GLP-1 agonist. LD reported 10/16/22.   Preop labs reviewed, unremarkable.   EKG 04/28/21: Sinus bradycardia.  Rate 58.  Possible Inferior infarct , age undetermined. Possible Anterior infarct , age undetermined. T wave inversions noted in lead I and aVL, which have been noted on prior EKGs as well.  Carotid Duplex 08/20/22 (care everywhere): Impression: There are no prior studies for comparison 1. There is focal plaquing both calcified and noncalcified in the carotid bulb and the right ICA with marked narrowing suggestion of admixture of flow focally. The degree of stenosis appears to on grayscale images greater than the corresponding velocities and peak systolic velocities may not reflect the degree of ICA stenosis. CTA examination of the neck would be recommended. Based on the velocity criteria there is greater than 70% stenosis at the proximal right ICA noted with prior endarterectomy performed on this side. Vascular surgery consultation would also be  recommended   2. 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 The peak  systolic velocities  within the left ICA are noted to be low for the degree of  grayscale stenosis and the degree of stenosis based on  velocities may be under estimated on this exam. CTA examination  of the neck would be recommended to further evaluate as well as  vascular surgery   Transthoracic echocardiogram 08/09/2019 (Care Everywhere): Transthoracic echocardiogram 09/07/2021 There is mild concentric left ventricular hypertrophy. The left ventricle is mildly dilated. Left ventricular systolic function is moderately reduced. EF= 43% by 2D biplane method. Inferior wall severe hypokinesis. Septal motion is consistent with post-operative state. Inferoseptal wall moderate hypokinesis. Grade I diastolic dysfunction, (abnormal relaxation pattern). There is mild aortic sclerosis. No hemodynamically significant valvular aortic stenosis. There is trace to mild mitral regurgitation. There is trace tricuspid regurgitation. Right ventricular systolic pressure is estimated at 30 mmHg. Compared to the prior echo report on 09/06/2019, there is no significant  change.    Nuclear stress 11/03/2016: Nuclear stress EF: 27%. Blood pressure demonstrated a normal response to exercise. There was no ST segment deviation noted during stress. Findings consistent with prior myocardial infarction. This is a high risk study. The left ventricular ejection fraction is severely decreased (<30%).   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

## 2022-10-28 ENCOUNTER — Inpatient Hospital Stay (HOSPITAL_COMMUNITY): Payer: Medicare Other

## 2022-10-28 ENCOUNTER — Inpatient Hospital Stay (HOSPITAL_COMMUNITY): Payer: Medicare Other | Admitting: Physician Assistant

## 2022-10-28 ENCOUNTER — Inpatient Hospital Stay (HOSPITAL_COMMUNITY): Payer: Medicare Other | Admitting: Anesthesiology

## 2022-10-28 ENCOUNTER — Other Ambulatory Visit: Payer: Self-pay

## 2022-10-28 ENCOUNTER — Encounter (HOSPITAL_COMMUNITY)
Admission: RE | Disposition: A | Discharge status: Home Health | Payer: Self-pay | Source: Home / Self Care | Attending: Vascular Surgery

## 2022-10-28 ENCOUNTER — Inpatient Hospital Stay (HOSPITAL_COMMUNITY)
Admission: RE | Admit: 2022-10-28 | Discharge: 2022-11-03 | DRG: 981 | Disposition: A | Discharge status: Home Health | Payer: Medicare Other | Attending: Vascular Surgery | Admitting: Vascular Surgery

## 2022-10-28 ENCOUNTER — Encounter (HOSPITAL_COMMUNITY): Payer: Self-pay | Admitting: Vascular Surgery

## 2022-10-28 DIAGNOSIS — Y838 Other surgical procedures as the cause of abnormal reaction of the patient, or of later complication, without mention of misadventure at the time of the procedure: Secondary | ICD-10-CM | POA: Diagnosis not present

## 2022-10-28 DIAGNOSIS — I6523 Occlusion and stenosis of bilateral carotid arteries: Principal | ICD-10-CM | POA: Diagnosis present

## 2022-10-28 DIAGNOSIS — I252 Old myocardial infarction: Secondary | ICD-10-CM

## 2022-10-28 DIAGNOSIS — I5020 Unspecified systolic (congestive) heart failure: Secondary | ICD-10-CM

## 2022-10-28 DIAGNOSIS — Z87891 Personal history of nicotine dependence: Secondary | ICD-10-CM | POA: Diagnosis not present

## 2022-10-28 DIAGNOSIS — I110 Hypertensive heart disease with heart failure: Secondary | ICD-10-CM | POA: Diagnosis present

## 2022-10-28 DIAGNOSIS — Z79899 Other long term (current) drug therapy: Secondary | ICD-10-CM

## 2022-10-28 DIAGNOSIS — I2510 Atherosclerotic heart disease of native coronary artery without angina pectoris: Secondary | ICD-10-CM | POA: Diagnosis present

## 2022-10-28 DIAGNOSIS — I509 Heart failure, unspecified: Secondary | ICD-10-CM

## 2022-10-28 DIAGNOSIS — I469 Cardiac arrest, cause unspecified: Principal | ICD-10-CM | POA: Diagnosis present

## 2022-10-28 DIAGNOSIS — Z9049 Acquired absence of other specified parts of digestive tract: Secondary | ICD-10-CM | POA: Diagnosis not present

## 2022-10-28 DIAGNOSIS — I4729 Other ventricular tachycardia: Secondary | ICD-10-CM | POA: Diagnosis not present

## 2022-10-28 DIAGNOSIS — I493 Ventricular premature depolarization: Secondary | ICD-10-CM | POA: Diagnosis not present

## 2022-10-28 DIAGNOSIS — I2582 Chronic total occlusion of coronary artery: Secondary | ICD-10-CM | POA: Diagnosis present

## 2022-10-28 DIAGNOSIS — I5043 Acute on chronic combined systolic (congestive) and diastolic (congestive) heart failure: Secondary | ICD-10-CM | POA: Diagnosis present

## 2022-10-28 DIAGNOSIS — Z823 Family history of stroke: Secondary | ICD-10-CM

## 2022-10-28 DIAGNOSIS — Z8673 Personal history of transient ischemic attack (TIA), and cerebral infarction without residual deficits: Secondary | ICD-10-CM | POA: Diagnosis not present

## 2022-10-28 DIAGNOSIS — E119 Type 2 diabetes mellitus without complications: Secondary | ICD-10-CM

## 2022-10-28 DIAGNOSIS — Z825 Family history of asthma and other chronic lower respiratory diseases: Secondary | ICD-10-CM | POA: Diagnosis not present

## 2022-10-28 DIAGNOSIS — I5082 Biventricular heart failure: Secondary | ICD-10-CM | POA: Diagnosis present

## 2022-10-28 DIAGNOSIS — I25810 Atherosclerosis of coronary artery bypass graft(s) without angina pectoris: Secondary | ICD-10-CM | POA: Diagnosis present

## 2022-10-28 DIAGNOSIS — E876 Hypokalemia: Secondary | ICD-10-CM | POA: Diagnosis present

## 2022-10-28 DIAGNOSIS — Z8261 Family history of arthritis: Secondary | ICD-10-CM

## 2022-10-28 DIAGNOSIS — Z951 Presence of aortocoronary bypass graft: Secondary | ICD-10-CM

## 2022-10-28 DIAGNOSIS — H539 Unspecified visual disturbance: Secondary | ICD-10-CM | POA: Diagnosis present

## 2022-10-28 DIAGNOSIS — Z7902 Long term (current) use of antithrombotics/antiplatelets: Secondary | ICD-10-CM

## 2022-10-28 DIAGNOSIS — I6529 Occlusion and stenosis of unspecified carotid artery: Secondary | ICD-10-CM | POA: Diagnosis present

## 2022-10-28 DIAGNOSIS — I272 Pulmonary hypertension, unspecified: Secondary | ICD-10-CM | POA: Diagnosis present

## 2022-10-28 DIAGNOSIS — I97711 Intraoperative cardiac arrest during other surgery: Secondary | ICD-10-CM | POA: Diagnosis not present

## 2022-10-28 DIAGNOSIS — E1151 Type 2 diabetes mellitus with diabetic peripheral angiopathy without gangrene: Secondary | ICD-10-CM | POA: Diagnosis present

## 2022-10-28 DIAGNOSIS — E785 Hyperlipidemia, unspecified: Secondary | ICD-10-CM | POA: Diagnosis present

## 2022-10-28 DIAGNOSIS — Z538 Procedure and treatment not carried out for other reasons: Secondary | ICD-10-CM | POA: Diagnosis not present

## 2022-10-28 DIAGNOSIS — Z7984 Long term (current) use of oral hypoglycemic drugs: Secondary | ICD-10-CM

## 2022-10-28 DIAGNOSIS — K219 Gastro-esophageal reflux disease without esophagitis: Secondary | ICD-10-CM | POA: Diagnosis present

## 2022-10-28 DIAGNOSIS — I959 Hypotension, unspecified: Secondary | ICD-10-CM | POA: Diagnosis not present

## 2022-10-28 DIAGNOSIS — I255 Ischemic cardiomyopathy: Secondary | ICD-10-CM | POA: Diagnosis present

## 2022-10-28 DIAGNOSIS — I6521 Occlusion and stenosis of right carotid artery: Secondary | ICD-10-CM

## 2022-10-28 DIAGNOSIS — R0789 Other chest pain: Secondary | ICD-10-CM | POA: Diagnosis not present

## 2022-10-28 DIAGNOSIS — Z833 Family history of diabetes mellitus: Secondary | ICD-10-CM | POA: Diagnosis not present

## 2022-10-28 DIAGNOSIS — I472 Ventricular tachycardia, unspecified: Secondary | ICD-10-CM | POA: Diagnosis not present

## 2022-10-28 DIAGNOSIS — Z8249 Family history of ischemic heart disease and other diseases of the circulatory system: Secondary | ICD-10-CM

## 2022-10-28 DIAGNOSIS — Z7985 Long-term (current) use of injectable non-insulin antidiabetic drugs: Secondary | ICD-10-CM

## 2022-10-28 DIAGNOSIS — I2583 Coronary atherosclerosis due to lipid rich plaque: Secondary | ICD-10-CM | POA: Diagnosis not present

## 2022-10-28 DIAGNOSIS — Z7982 Long term (current) use of aspirin: Secondary | ICD-10-CM

## 2022-10-28 DIAGNOSIS — I5042 Chronic combined systolic (congestive) and diastolic (congestive) heart failure: Secondary | ICD-10-CM | POA: Diagnosis not present

## 2022-10-28 HISTORY — PX: TRANSCAROTID ARTERY REVASCULARIZATIONÂ: SHX6778

## 2022-10-28 LAB — CBC WITH DIFFERENTIAL/PLATELET
Abs Immature Granulocytes: 0.03 10*3/uL (ref 0.00–0.07)
Basophils Absolute: 0 10*3/uL (ref 0.0–0.1)
Basophils Relative: 0 %
Eosinophils Absolute: 0.2 10*3/uL (ref 0.0–0.5)
Eosinophils Relative: 2 %
HCT: 42.7 % (ref 39.0–52.0)
Hemoglobin: 14.5 g/dL (ref 13.0–17.0)
Immature Granulocytes: 0 %
Lymphocytes Relative: 15 %
Lymphs Abs: 1.5 10*3/uL (ref 0.7–4.0)
MCH: 32.9 pg (ref 26.0–34.0)
MCHC: 34 g/dL (ref 30.0–36.0)
MCV: 96.8 fL (ref 80.0–100.0)
Monocytes Absolute: 0.5 10*3/uL (ref 0.1–1.0)
Monocytes Relative: 5 %
Neutro Abs: 7.8 10*3/uL — ABNORMAL HIGH (ref 1.7–7.7)
Neutrophils Relative %: 78 %
Platelets: 189 10*3/uL (ref 150–400)
RBC: 4.41 MIL/uL (ref 4.22–5.81)
RDW: 12.3 % (ref 11.5–15.5)
WBC: 10 10*3/uL (ref 4.0–10.5)
nRBC: 0 % (ref 0.0–0.2)

## 2022-10-28 LAB — POCT I-STAT 7, (LYTES, BLD GAS, ICA,H+H)
Acid-base deficit: 7 mmol/L — ABNORMAL HIGH (ref 0.0–2.0)
Acid-base deficit: 6 mmol/L — ABNORMAL HIGH (ref 0.0–2.0)
Acid-base deficit: 6 mmol/L — ABNORMAL HIGH (ref 0.0–2.0)
Bicarbonate: 19.9 mmol/L — ABNORMAL LOW (ref 20.0–28.0)
Bicarbonate: 20.1 mmol/L (ref 20.0–28.0)
Bicarbonate: 18.7 mmol/L — ABNORMAL LOW (ref 20.0–28.0)
Calcium, Ion: 1.18 mmol/L (ref 1.15–1.40)
Calcium, Ion: 1.27 mmol/L (ref 1.15–1.40)
Calcium, Ion: 1.25 mmol/L (ref 1.15–1.40)
HCT: 46 % (ref 39.0–52.0)
HCT: 42 % (ref 39.0–52.0)
HCT: 43 % (ref 39.0–52.0)
Hemoglobin: 15.6 g/dL (ref 13.0–17.0)
Hemoglobin: 14.3 g/dL (ref 13.0–17.0)
Hemoglobin: 14.6 g/dL (ref 13.0–17.0)
O2 Saturation: 88 %
O2 Saturation: 100 %
O2 Saturation: 96 %
Patient temperature: 37
Patient temperature: 35.9
Potassium: 4.4 mmol/L (ref 3.5–5.1)
Potassium: 4.7 mmol/L (ref 3.5–5.1)
Potassium: 4.6 mmol/L (ref 3.5–5.1)
Sodium: 138 mmol/L (ref 135–145)
Sodium: 136 mmol/L (ref 135–145)
Sodium: 137 mmol/L (ref 135–145)
TCO2: 21 mmol/L — ABNORMAL LOW (ref 22–32)
TCO2: 21 mmol/L — ABNORMAL LOW (ref 22–32)
TCO2: 20 mmol/L — ABNORMAL LOW (ref 22–32)
pCO2 arterial: 41.5 mmHg (ref 32–48)
pCO2 arterial: 41.2 mmHg (ref 32–48)
pCO2 arterial: 34 mmHg (ref 32–48)
pH, Arterial: 7.287 — ABNORMAL LOW (ref 7.35–7.45)
pH, Arterial: 7.296 — ABNORMAL LOW (ref 7.35–7.45)
pH, Arterial: 7.344 — ABNORMAL LOW (ref 7.35–7.45)
pO2, Arterial: 61 mmHg — ABNORMAL LOW (ref 83–108)
pO2, Arterial: 287 mmHg — ABNORMAL HIGH (ref 83–108)
pO2, Arterial: 83 mmHg (ref 83–108)

## 2022-10-28 LAB — URINALYSIS, ROUTINE W REFLEX MICROSCOPIC
Bilirubin Urine: NEGATIVE
Glucose, UA: >=500 mg/dL — AB
Hgb urine dipstick: NEGATIVE
Ketones, ur: NEGATIVE mg/dL
Leukocytes,Ua: NEGATIVE
Nitrite: NEGATIVE
Protein, ur: 100 mg/dL — AB
Specific Gravity, Urine: 1.025 (ref 1.005–1.030)
pH: 6 (ref 5.0–8.0)

## 2022-10-28 LAB — PROTIME-INR
INR: 1.2 (ref 0.8–1.2)
Prothrombin Time: 15.3 seconds — ABNORMAL HIGH (ref 11.4–15.2)

## 2022-10-28 LAB — BASIC METABOLIC PANEL
Anion gap: 10 (ref 5–15)
BUN: 17 mg/dL (ref 8–23)
CO2: 19 mmol/L — ABNORMAL LOW (ref 22–32)
Calcium: 8.7 mg/dL — ABNORMAL LOW (ref 8.9–10.3)
Chloride: 105 mmol/L (ref 98–111)
Creatinine, Ser: 1.05 mg/dL (ref 0.61–1.24)
GFR, Estimated: >60 mL/min (ref >60)
Glucose, Bld: 228 mg/dL — ABNORMAL HIGH (ref 70–99)
Potassium: 4.6 mmol/L (ref 3.5–5.1)
Sodium: 134 mmol/L — ABNORMAL LOW (ref 135–145)

## 2022-10-28 LAB — MAGNESIUM: Magnesium: 1.7 mg/dL (ref 1.7–2.4)

## 2022-10-28 LAB — GLUCOSE, CAPILLARY
Glucose-Capillary: 135 mg/dL — ABNORMAL HIGH (ref 70–99)
Glucose-Capillary: 149 mg/dL — ABNORMAL HIGH (ref 70–99)
Glucose-Capillary: 154 mg/dL — ABNORMAL HIGH (ref 70–99)
Glucose-Capillary: 162 mg/dL — ABNORMAL HIGH (ref 70–99)
Glucose-Capillary: 91 mg/dL (ref 70–99)

## 2022-10-28 LAB — URINALYSIS, MICROSCOPIC (REFLEX)

## 2022-10-28 LAB — ECHOCARDIOGRAM COMPLETE
Area-P 1/2: 4.8 cm2
Est EF: <20
Height: 72 in
S' Lateral: 5.8 cm
Weight: 3440 oz

## 2022-10-28 LAB — HEPARIN LEVEL (UNFRACTIONATED): Heparin Unfractionated: 0.36 IU/mL (ref 0.30–0.70)

## 2022-10-28 LAB — APTT: aPTT: 34 seconds (ref 24–36)

## 2022-10-28 LAB — HIV ANTIBODY (ROUTINE TESTING W REFLEX): HIV Screen 4th Generation wRfx: NONREACTIVE

## 2022-10-28 LAB — HEMOGLOBIN A1C
Hgb A1c MFr Bld: 6.5 % — ABNORMAL HIGH (ref 4.8–5.6)
Mean Plasma Glucose: 139.85 mg/dL

## 2022-10-28 LAB — TROPONIN I (HIGH SENSITIVITY)
Troponin I (High Sensitivity): 73 ng/L — ABNORMAL HIGH (ref <18)
Troponin I (High Sensitivity): 261 ng/L (ref <18)

## 2022-10-28 LAB — PHOSPHORUS: Phosphorus: 3.2 mg/dL (ref 2.5–4.6)

## 2022-10-28 LAB — LACTIC ACID, PLASMA: Lactic Acid, Venous: 1.4 mmol/L (ref 0.5–1.9)

## 2022-10-28 SURGERY — TRANSCAROTID ARTERY REVASCULARIZATION (TCAR)
Anesthesia: General | Laterality: Right

## 2022-10-28 MED ORDER — LACTATED RINGERS IV SOLN: INTRAVENOUS | Status: DC

## 2022-10-28 MED ORDER — FENTANYL BOLUS VIA INFUSION: 25.0000 ug | INTRAVENOUS | Status: DC | PRN

## 2022-10-28 MED ORDER — POTASSIUM CHLORIDE CRYS ER 20 MEQ PO TBCR: 20.0000 meq | EXTENDED_RELEASE_TABLET | Freq: Once | ORAL | Status: DC

## 2022-10-28 MED ORDER — ORAL CARE MOUTH RINSE: 15.0000 mL | OROMUCOSAL | Status: DC | PRN

## 2022-10-28 MED ORDER — PANTOPRAZOLE SODIUM 40 MG PO TBEC
40.0000 mg | DELAYED_RELEASE_TABLET | Freq: Every day | ORAL | Status: DC
  Administered 2022-10-29 – 2022-11-03 (×6): 40 mg via ORAL
  Filled 2022-10-28 (×7): qty 1

## 2022-10-28 MED ORDER — PROPOFOL 10 MG/ML IV BOLUS
INTRAVENOUS | Status: AC
  Filled 2022-10-28: qty 20

## 2022-10-28 MED ORDER — ACETAMINOPHEN 500 MG PO TABS
1000.0000 mg | ORAL_TABLET | Freq: Once | ORAL | Status: AC
  Administered 2022-10-28: 1000 mg via ORAL
  Filled 2022-10-28: qty 2

## 2022-10-28 MED ORDER — SODIUM CHLORIDE 0.9 % IV SOLN: INTRAVENOUS | Status: DC | PRN

## 2022-10-28 MED ORDER — MIDAZOLAM HCL 2 MG/2ML IJ SOLN: 1.0000 mg | INTRAMUSCULAR | Status: DC | PRN

## 2022-10-28 MED ORDER — HEPARIN (PORCINE) 25000 UT/250ML-% IV SOLN
1400.0000 [IU]/h | INTRAVENOUS | Status: DC
  Administered 2022-10-28 – 2022-10-29 (×2): 1200 [IU]/h via INTRAVENOUS
  Filled 2022-10-28 (×2): qty 250

## 2022-10-28 MED ORDER — EPINEPHRINE PF 1 MG/ML IJ SOLN
INTRAMUSCULAR | Status: DC | PRN
  Administered 2022-10-28 (×3): .5 mg via INTRAVENOUS

## 2022-10-28 MED ORDER — LIDOCAINE 2% (20 MG/ML) 5 ML SYRINGE
INTRAMUSCULAR | Status: DC | PRN
  Administered 2022-10-28: 100 mg via INTRAVENOUS

## 2022-10-28 MED ORDER — KCL IN DEXTROSE-NACL 20-5-0.45 MEQ/L-%-% IV SOLN
INTRAVENOUS | Status: DC
  Filled 2022-10-28: qty 1000

## 2022-10-28 MED ORDER — CHLORHEXIDINE GLUCONATE CLOTH 2 % EX PADS: 6.0000 | MEDICATED_PAD | Freq: Once | CUTANEOUS | Status: DC

## 2022-10-28 MED ORDER — ROCURONIUM BROMIDE 10 MG/ML (PF) SYRINGE
PREFILLED_SYRINGE | INTRAVENOUS | Status: DC | PRN
  Administered 2022-10-28: 60 mg via INTRAVENOUS

## 2022-10-28 MED ORDER — INSULIN ASPART 100 UNIT/ML IJ SOLN
0.0000 [IU] | INTRAMUSCULAR | Status: DC | PRN
  Administered 2022-10-28: 2 [IU] via SUBCUTANEOUS
  Filled 2022-10-28: qty 1

## 2022-10-28 MED ORDER — MIDAZOLAM HCL 2 MG/2ML IJ SOLN
INTRAMUSCULAR | Status: DC | PRN
  Administered 2022-10-28: 2 mg via INTRAVENOUS

## 2022-10-28 MED ORDER — VASOPRESSIN 20 UNIT/ML IV SOLN
INTRAVENOUS | Status: DC | PRN
  Administered 2022-10-28 (×2): 1 [IU] via INTRAVENOUS

## 2022-10-28 MED ORDER — FENTANYL CITRATE PF 50 MCG/ML IJ SOSY
25.0000 ug | PREFILLED_SYRINGE | Freq: Once | INTRAMUSCULAR | Status: AC
  Administered 2022-10-28: 25 ug via INTRAVENOUS
  Filled 2022-10-28: qty 1

## 2022-10-28 MED ORDER — VASOPRESSIN 20 UNIT/ML IV SOLN
INTRAVENOUS | Status: AC
  Filled 2022-10-28: qty 1

## 2022-10-28 MED ORDER — ORAL CARE MOUTH RINSE: 15.0000 mL | Freq: Once | OROMUCOSAL | Status: DC

## 2022-10-28 MED ORDER — INSULIN ASPART 100 UNIT/ML IJ SOLN
0.0000 [IU] | INTRAMUSCULAR | Status: DC
  Administered 2022-10-28 – 2022-10-29 (×3): 1 [IU] via SUBCUTANEOUS
  Administered 2022-10-29: 2 [IU] via SUBCUTANEOUS
  Administered 2022-10-31: 3 [IU] via SUBCUTANEOUS
  Administered 2022-10-31: 2 [IU] via SUBCUTANEOUS
  Administered 2022-10-31 – 2022-11-01 (×3): 1 [IU] via SUBCUTANEOUS
  Administered 2022-11-02: 2 [IU] via SUBCUTANEOUS

## 2022-10-28 MED ORDER — DOCUSATE SODIUM 50 MG/5ML PO LIQD: 100.0000 mg | Freq: Two times a day (BID) | ORAL | Status: DC

## 2022-10-28 MED ORDER — MAGNESIUM SULFATE 2 GM/50ML IV SOLN
2.0000 g | Freq: Once | INTRAVENOUS | Status: AC
  Administered 2022-10-28: 2 g via INTRAVENOUS
  Filled 2022-10-28: qty 50

## 2022-10-28 MED ORDER — PHENYLEPHRINE 80 MCG/ML (10ML) SYRINGE FOR IV PUSH (FOR BLOOD PRESSURE SUPPORT)
PREFILLED_SYRINGE | INTRAVENOUS | Status: DC | PRN
  Administered 2022-10-28: 160 ug via INTRAVENOUS

## 2022-10-28 MED ORDER — SODIUM CHLORIDE 0.9 % IV SOLN: INTRAVENOUS | Status: DC

## 2022-10-28 MED ORDER — VITAMIN D3 25 MCG (1000 UNIT) PO TABS
2000.0000 [IU] | ORAL_TABLET | Freq: Every day | ORAL | Status: DC
  Administered 2022-10-29 – 2022-11-03 (×6): 2000 [IU] via ORAL
  Filled 2022-10-28 (×12): qty 2

## 2022-10-28 MED ORDER — POLYETHYLENE GLYCOL 3350 17 G PO PACK: 17.0000 g | PACK | Freq: Every day | ORAL | Status: DC

## 2022-10-28 MED ORDER — ONDANSETRON HCL 4 MG/2ML IJ SOLN: 4.0000 mg | Freq: Four times a day (QID) | INTRAMUSCULAR | Status: DC | PRN

## 2022-10-28 MED ORDER — HYDROMORPHONE HCL 1 MG/ML IJ SOLN: 0.5000 mg | INTRAMUSCULAR | Status: DC | PRN

## 2022-10-28 MED ORDER — METOPROLOL TARTRATE 5 MG/5ML IV SOLN: 2.0000 mg | INTRAVENOUS | Status: DC | PRN

## 2022-10-28 MED ORDER — POLYETHYLENE GLYCOL 3350 17 G PO PACK: 17.0000 g | PACK | Freq: Every day | ORAL | Status: DC | PRN

## 2022-10-28 MED ORDER — MIDAZOLAM HCL 2 MG/2ML IJ SOLN
INTRAMUSCULAR | Status: AC
  Filled 2022-10-28: qty 2

## 2022-10-28 MED ORDER — CHLORHEXIDINE GLUCONATE 0.12 % MT SOLN: 15.0000 mL | Freq: Once | OROMUCOSAL | Status: DC

## 2022-10-28 MED ORDER — HYDRALAZINE HCL 20 MG/ML IJ SOLN: 5.0000 mg | INTRAMUSCULAR | Status: DC | PRN

## 2022-10-28 MED ORDER — OXIDIZED CELLULOSE EX PADS
1.0000 | MEDICATED_PAD | CUTANEOUS | Status: AC
  Filled 2022-10-28: qty 1

## 2022-10-28 MED ORDER — PHENOL 1.4 % MT LIQD: 1.0000 | OROMUCOSAL | Status: DC | PRN

## 2022-10-28 MED ORDER — HEPARIN SODIUM (PORCINE) 5000 UNIT/ML IJ SOLN: 5000.0000 [IU] | Freq: Three times a day (TID) | INTRAMUSCULAR | Status: DC

## 2022-10-28 MED ORDER — PHENYLEPHRINE HCL-NACL 20-0.9 MG/250ML-% IV SOLN: 0.0000 ug/min | INTRAVENOUS | Status: DC

## 2022-10-28 MED ORDER — FENTANYL CITRATE (PF) 250 MCG/5ML IJ SOLN
INTRAMUSCULAR | Status: AC
  Filled 2022-10-28: qty 5

## 2022-10-28 MED ORDER — PERFLUTREN LIPID MICROSPHERE
1.0000 mL | INTRAVENOUS | Status: AC | PRN
  Administered 2022-10-28: 6 mL via INTRAVENOUS

## 2022-10-28 MED ORDER — FAMOTIDINE 20 MG PO TABS: 20.0000 mg | ORAL_TABLET | Freq: Two times a day (BID) | ORAL | Status: DC

## 2022-10-28 MED ORDER — ALBUMIN HUMAN 5 % IV SOLN: INTRAVENOUS | Status: DC | PRN

## 2022-10-28 MED ORDER — PHENYLEPHRINE HCL-NACL 20-0.9 MG/250ML-% IV SOLN
INTRAVENOUS | Status: DC | PRN
  Administered 2022-10-28: 50 ug/min via INTRAVENOUS

## 2022-10-28 MED ORDER — FENTANYL CITRATE (PF) 250 MCG/5ML IJ SOLN
INTRAMUSCULAR | Status: DC | PRN
  Administered 2022-10-28: 100 ug via INTRAVENOUS

## 2022-10-28 MED ORDER — ALUM & MAG HYDROXIDE-SIMETH 200-200-20 MG/5ML PO SUSP: 15.0000 mL | ORAL | Status: DC | PRN

## 2022-10-28 MED ORDER — GUAIFENESIN-DM 100-10 MG/5ML PO SYRP: 15.0000 mL | ORAL_SOLUTION | ORAL | Status: DC | PRN

## 2022-10-28 MED ORDER — ROSUVASTATIN CALCIUM 5 MG PO TABS
10.0000 mg | ORAL_TABLET | Freq: Every day | ORAL | Status: DC
  Administered 2022-10-29 – 2022-11-03 (×6): 10 mg via ORAL
  Filled 2022-10-28 (×6): qty 2

## 2022-10-28 MED ORDER — DOCUSATE SODIUM 100 MG PO CAPS: 100.0000 mg | ORAL_CAPSULE | Freq: Two times a day (BID) | ORAL | Status: DC | PRN

## 2022-10-28 MED ORDER — EPHEDRINE SULFATE-NACL 50-0.9 MG/10ML-% IV SOSY
PREFILLED_SYRINGE | INTRAVENOUS | Status: DC | PRN
  Administered 2022-10-28: 10 mg via INTRAVENOUS

## 2022-10-28 MED ORDER — NOREPINEPHRINE 4 MG/250ML-% IV SOLN: 0.0000 ug/min | INTRAVENOUS | Status: DC

## 2022-10-28 MED ORDER — FENTANYL 2500MCG IN NS 250ML (10MCG/ML) PREMIX INFUSION
25.0000 ug/h | INTRAVENOUS | Status: DC
  Administered 2022-10-28: 100 ug/h via INTRAVENOUS
  Filled 2022-10-28: qty 250

## 2022-10-28 MED ORDER — ASPIRIN 81 MG PO CHEW
81.0000 mg | CHEWABLE_TABLET | Freq: Every day | ORAL | Status: DC
  Administered 2022-10-29 – 2022-11-02 (×5): 81 mg
  Filled 2022-10-28 (×5): qty 1

## 2022-10-28 MED ORDER — HEPARIN BOLUS VIA INFUSION
4000.0000 [IU] | Freq: Once | INTRAVENOUS | Status: AC
  Administered 2022-10-28: 4000 [IU] via INTRAVENOUS
  Filled 2022-10-28: qty 4000

## 2022-10-28 MED ORDER — SODIUM CHLORIDE 0.9 % IV SOLN
INTRAVENOUS | Status: DC | PRN
  Administered 2022-10-28: 5 ug/min via INTRAVENOUS

## 2022-10-28 MED ORDER — CEFAZOLIN SODIUM-DEXTROSE 2-4 GM/100ML-% IV SOLN
2.0000 g | INTRAVENOUS | Status: AC
  Administered 2022-10-28: 2 g via INTRAVENOUS
  Filled 2022-10-28: qty 100

## 2022-10-28 MED ORDER — ASPIRIN 325 MG PO TABS: 325.0000 mg | ORAL_TABLET | Freq: Once | ORAL | Status: DC

## 2022-10-28 MED ORDER — CHLORHEXIDINE GLUCONATE 0.12 % MT SOLN
OROMUCOSAL | Status: AC
  Administered 2022-10-28: 15 mL
  Filled 2022-10-28: qty 15

## 2022-10-28 MED ORDER — HEPARIN 6000 UNIT IRRIGATION SOLUTION
Status: AC
  Filled 2022-10-28: qty 500

## 2022-10-28 MED ORDER — GLYCOPYRROLATE 0.2 MG/ML IJ SOLN
INTRAMUSCULAR | Status: DC | PRN
  Administered 2022-10-28: .2 mg via INTRAVENOUS

## 2022-10-28 MED ORDER — CALCIUM CHLORIDE 10 % IV SOLN
INTRAVENOUS | Status: DC | PRN
  Administered 2022-10-28: 250 mg via INTRAVENOUS

## 2022-10-28 MED ORDER — EPINEPHRINE HCL 5 MG/250ML IV SOLN IN NS: 0.5000 ug/min | INTRAVENOUS | Status: DC

## 2022-10-28 MED ORDER — LABETALOL HCL 5 MG/ML IV SOLN: 10.0000 mg | INTRAVENOUS | Status: DC | PRN

## 2022-10-28 MED ORDER — CHLORHEXIDINE GLUCONATE CLOTH 2 % EX PADS
6.0000 | MEDICATED_PAD | Freq: Every day | CUTANEOUS | Status: DC
  Administered 2022-10-28 – 2022-10-29 (×3): 6 via TOPICAL

## 2022-10-28 MED ORDER — PROPOFOL 10 MG/ML IV BOLUS
INTRAVENOUS | Status: DC | PRN
  Administered 2022-10-28: 150 mg via INTRAVENOUS

## 2022-10-28 SURGICAL SUPPLY — 50 items
ADH SKN CLS LQ APL DERMABOND (GAUZE/BANDAGES/DRESSINGS)
APL PRP STRL LF DISP 70% ISPRP (MISCELLANEOUS)
BAG BANDED W/RUBBER/TAPE 36X54 (MISCELLANEOUS) ×1 IMPLANT
BAG EQP BAND 135X91 W/RBR TAPE (MISCELLANEOUS)
BALLN STERLING RX 6X20X80 (BALLOONS)
BALLOON STERLING RX 6X20X80 (BALLOONS) IMPLANT
CANISTER SUCT 3000ML PPV (MISCELLANEOUS) ×1 IMPLANT
CHLORAPREP W/TINT 26 (MISCELLANEOUS) ×2 IMPLANT
CLIP LIGATING EXTRA MED SLVR (CLIP) IMPLANT
CLIP LIGATING EXTRA SM BLUE (MISCELLANEOUS) IMPLANT
COVER DOME SNAP 22 D (MISCELLANEOUS) ×1 IMPLANT
COVER PROBE W GEL 5X96 (DRAPES) ×1 IMPLANT
DERMABOND ADVANCED .7 DNX6 (GAUZE/BANDAGES/DRESSINGS) ×1 IMPLANT
DRAPE FEMORAL ANGIO 80X135IN (DRAPES) ×1 IMPLANT
ELECT REM PT RETURN 9FT ADLT (ELECTROSURGICAL)
ELECTRODE REM PT RTRN 9FT ADLT (ELECTROSURGICAL) ×1 IMPLANT
GAUZE SPONGE 4X4 12PLY STRL (GAUZE/BANDAGES/DRESSINGS) ×1 IMPLANT
GLOVE BIO SURGEON STRL SZ8 (GLOVE) ×1 IMPLANT
GOWN STRL REUS W/ TWL LRG LVL3 (GOWN DISPOSABLE) ×2 IMPLANT
GOWN STRL REUS W/ TWL XL LVL3 (GOWN DISPOSABLE) ×1 IMPLANT
GOWN STRL REUS W/TWL LRG LVL3 (GOWN DISPOSABLE)
GOWN STRL REUS W/TWL XL LVL3 (GOWN DISPOSABLE)
GUIDEWIRE ENROUTE 0.014 (WIRE) ×1 IMPLANT
INTRODUCER KIT GALT 7CM (INTRODUCER)
KIT BASIN OR (CUSTOM PROCEDURE TRAY) ×1 IMPLANT
KIT ENCORE 26 ADVANTAGE (KITS) ×1 IMPLANT
KIT INTRODUCER GALT 7 (INTRODUCER) ×1 IMPLANT
KIT TURNOVER KIT B (KITS) ×1 IMPLANT
NDL HYPO 25GX1X1/2 BEV (NEEDLE) IMPLANT
NEEDLE HYPO 25GX1X1/2 BEV (NEEDLE) IMPLANT
NS IRRIG 1000ML POUR BTL (IV SOLUTION) ×1 IMPLANT
PACK CAROTID (CUSTOM PROCEDURE TRAY) ×1 IMPLANT
POSITIONER HEAD DONUT 9IN (MISCELLANEOUS) ×1 IMPLANT
SET MICROPUNCTURE 5F STIFF (MISCELLANEOUS) ×1 IMPLANT
SHEATH AVANTI 11CM 5FR (SHEATH) IMPLANT
SHUNT CAROTID BYPASS 10 (VASCULAR PRODUCTS) IMPLANT
STOPCOCK MORSE 400PSI 3WAY (MISCELLANEOUS) IMPLANT
SUT MNCRL AB 4-0 PS2 18 (SUTURE) ×1 IMPLANT
SUT PROLENE 5 0 C 1 24 (SUTURE) ×1 IMPLANT
SUT SILK 2 0 SH (SUTURE) ×1 IMPLANT
SUT VIC AB 3-0 SH 27 (SUTURE)
SUT VIC AB 3-0 SH 27X BRD (SUTURE) ×1 IMPLANT
SYR 10ML LL (SYRINGE) ×3 IMPLANT
SYR 20ML LL LF (SYRINGE) ×1 IMPLANT
SYR CONTROL 10ML LL (SYRINGE) IMPLANT
SYSTEM TRANSCAROTID NEUROPRTCT (MISCELLANEOUS) ×1 IMPLANT
TOWEL GREEN STERILE (TOWEL DISPOSABLE) ×1 IMPLANT
TRANSCAROTID NEUROPROTECT SYS (MISCELLANEOUS)
WATER STERILE IRR 1000ML POUR (IV SOLUTION) ×1 IMPLANT
WIRE BENTSON .035X145CM (WIRE) ×1 IMPLANT

## 2022-10-28 NOTE — OR Nursing (Signed)
Shortly after the start of TCAR procedure when wire was introduced into the left femoral vein the patient went into pulseless V tach. ACLS was initiated, patient was stabilized, and transferred to the ICU. All supplies were wasted.

## 2022-10-28 NOTE — Consult Note (Addendum)
Cardiology Consultation   Patient ID: [name] MRN: [id number]; DOB: 02-Jan-1956  Admit date: 10/28/2022 Date of Consult: 10/28/2022  PCP:  Tresa Garter, MD   [facility] Providers Cardiologist:  Kristeen Miss, MD   {   Patient Profile:   [name] is a 67 y.o. male with a history of CAD s/p CABG x4 in 01/2002, chronic HFrEF with EF of 30-35%, carotid artery disease s/p right CEA and patch angioplasty in 11/2016, PAD s/p right iliofemoral artery endarterectomy in May and patch angioplasty as well as stenting of bilateral common iliac arteries in 04/2021, hypertension, hyperlipidemia, type 2 diabetes mellitus, and TIA who is being seen 10/28/2022 for the evaluation of VT arrest at the request of Dr. Lenell Antu.  History of Present Illness:   [name] is a 66 year old male with the above history who was previously followed by Dr. Myrtis Ser and now follows with [name].  Patient has a long history of CAD and underwent CABG x4 (LIMA as a free graft to the diagonal, RIMA to LAD, SVG to OM 2, and SVG to PDA) in 01/2002 with Dr. Dorris Fetch. It does not look like he has had any repeat cardiac catheterization since that time. Last ischemic evaluation was a Myoview in 11/2016 which showed a large inferior lateral wall infarct from the apex to the base but no ischemia.  He also has known CHF with last known EF of 30-35% on Echo in 2015.  In addition, he has known carotid artery disease and PAD with prior interventions as described above.  He was last seen by [name] in 2019.  Patient presented to [facility] today for planned right transcarotid artery revascularization with Dr. Lenell Antu.  Patient was intubated and sedated.  However, procedure was aborted shortly after procedure began due to VT arrest.  I called and spoke with the Vascular Surgery PA-C Lianne Cure) to get additional details.  She states that it went into VT after the femoral vein wire was pulled out.   Compressions were started and he underwent 1 shock with ROSC.  However, he then went back into VT.  Compressions were restarted with return of ROSC prior to need for additional shock.  BP was reportedly markedly elevated at the time systolic BP in the 250s. Also spoke with Dr. Lenell Antu who states ROSC was obtained in less than 45 seconds during the first VT arrest. He states he was calling the patient's wife during the secondary so he did not see the actual rhythm but mentions that patient became brady'd down and then arrested. He estimates ROSC was obtained within 1-2 minutes that time. He was started on Epinephrine and Neo-Synephrine drip in the OR and then transported to the ED. Post arrest EKG showed sinus bradycardia, rate 55 bpm, with Q waves in lead III and minimal ST depression in leads V4-V6 as well as nonspecific T wave changes in inferior leads and 1 and aVL.  At the time of this evaluation, patient is intubated but awake. He responds to commands and will answer questions my shaking his head. He is having some chest soreness following CPR but denied any chest pain or shortness of breath prior to this event. It sounds like he was doing well from a cardiac standpoint prior to surgery. He has been following with Cardiology at the [facility].   Past Medical History:  Diagnosis Date   Aortic valve sclerosis    aortic valve sclerosis   Arthritis  CAD (coronary artery disease)    Myoview 5/18: 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 no ischemia EF 27%   Carotid artery disease    chronic total occlusion LCCA, retrograde flow from LECA to LICA, ,, 0-39%LICA, s/p Right CEA 2018   CHF (congestive heart failure)    Closed head injury with concussion    x2   Complication of anesthesia    Low blood pressure after colonoscopy   Diabetes mellitus without complication    Dizziness    Ejection fraction < 50%    EF 40%, echo 2006, no ICD needed  / EF 40%, echo, 11/2009   Former cigarette smoker     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 Georganna Skeans   Sleep disorder    Dr Shelle Iron, lose weight and consider sleep study later   Sleep disorder    TIA (transient ischemic attack)    by history   Volume overload     Past Surgical History:  Procedure Laterality Date   CHOLECYSTECTOMY     COLONOSCOPY  03/19/2009   CORONARY ARTERY BYPASS GRAFT     ENDARTERECTOMY Right 11/05/2016   Procedure: ENDARTERECTOMY RIGHT CAROTID;  Surgeon: Larina Earthly, MD;  Location: [facility] OR;  Service: Vascular;  Laterality: Right;   ENDARTERECTOMY FEMORAL Right 04/29/2021   Procedure: RIGHT ILIOFEMORAL ARTERY ENDARTERECTOMY WITH PROFUNDOPLASTY;  Surgeon: Leonie Douglas, MD;  Location: [facility] OR;  Service: Vascular;  Laterality: Right;   INSERTION OF ILIAC STENT Right 04/29/2021   Procedure: Ronny Flurry INSERTION OF BILATERAL COMMON ILIAC ARTERY KISSING STENTS;  Surgeon: Leonie Douglas, MD;  Location: [facility] OR;  Service: Vascular;  Laterality: Right;   PATCH ANGIOPLASTY Right 11/05/2016   Procedure: RIGHT CAROTID ARTERY PATCH ANGIOPLASTY USING HEMASHIELD PLATINUM FINESSE PATCH;  Surgeon: Larina Earthly, MD;  Location: [facility] OR;  Service: Vascular;  Laterality: Right;   PATCH ANGIOPLASTY Right 04/29/2021   Procedure: PATCH ANGIOPLASTY OF RIGHT COMMON FEMORAL ARTERY UISNG XENOSURE BOVINE PERICARDIUM PACTH;  Surgeon: Leonie Douglas, MD;  Location: [facility] OR;  Service: Vascular;  Laterality: Right;   SHOULDER ARTHROSCOPY Right 05/25/2018   Procedure: RIGHT ARTHROSCOPY SHOULDER LABRAL DEBRIDEMENT, rotator cuff debridement,  BICEPS TENOTOMY and subacromial decompression;  Surgeon: Jones Broom, MD;  Location: [facility] OR;  Service: Orthopedics;  Laterality: Right;   ULTRASOUND GUIDANCE FOR VASCULAR ACCESS Left 04/29/2021   Procedure: ULTRASOUND  GUIDANCE FOR VASCULAR ACCESS, LEFT FEMORAL ARTERY;  Surgeon: Leonie Douglas, MD;  Location: [facility] OR;  Service: Vascular;  Laterality: Left;     Home Medications:  Prior to Admission medications   Medication Sig Start Date End Date Taking? Authorizing Provider  aspirin 81 MG chewable tablet Chew 81 mg by mouth daily. 06/29/18  Yes [provider]  b complex vitamins capsule Take 1 capsule by mouth daily.   Yes [provider]  carvedilol (COREG) 25 MG tablet TAKE 1 TABLET (25 MG TOTAL) BY MOUTH 2 (TWO) TIMES DAILY WITH A MEAL. 02/06/18  Yes Plotnikov, Georgina Quint, MD  Cholecalciferol (VITAMIN D3) 50 MCG (2000 UT) TABS Take 2,000 Units by mouth daily.   Yes [provider]  clopidogrel (PLAVIX) 75 MG tablet Take 1 tablet (75 mg total) by  mouth daily. 10/12/22  Yes Leonie Douglas, MD  empagliflozin (JARDIANCE) 25 MG TABS tablet Take 25 mg by mouth daily. Patient taking differently: Take 12.5 mg by mouth daily. 08/11/17  Yes Romero Belling, MD  isosorbide dinitrate (ISORDIL) 10 MG tablet Take 10 mg by mouth 3 (three) times daily. 02/13/21  Yes [provider]  magnesium oxide (MAG-OX) 400 (240 Mg) MG tablet Take 400 mg by mouth at bedtime.   Yes [provider]  pantoprazole (PROTONIX) 40 MG tablet TAKE 1 TABLET (40 MG TOTAL) BY MOUTH DAILY. Patient taking differently: Take 40 mg by mouth at bedtime. 07/03/18  Yes Plotnikov, Georgina Quint, MD  rosuvastatin (CRESTOR) 20 MG tablet Take 10 mg by mouth daily. 03/17/21  Yes [provider]  sacubitril-valsartan (ENTRESTO) 97-103 MG Take 1 tablet by mouth 2 (two) times daily. 02/13/21  Yes [provider]  Semaglutide,0.25 or 0.5MG /DOS, (OZEMPIC, 0.25 OR 0.5 MG/DOSE,) 2 MG/1.5ML SOPN Inject 0.5 mg into the skin once a week. If unavailable, please recommend alternative. Patient taking differently: Inject 0.5 mg into the skin once a week. If unavailable, please recommend alternative. SAT 10/06/21  Yes Romero Belling, MD  spironolactone (ALDACTONE) 25 MG tablet Take 25 mg by mouth daily.    Yes [provider]  diclofenac Sodium (VOLTAREN) 1 % GEL Apply 2 g topically daily as needed (pain).    [provider]  linagliptin (TRADJENTA) 5 MG TABS tablet Take 1 tablet (5 mg total) by mouth daily. Patient not taking: Reported on 10/21/2022 04/16/21   Romero Belling, MD    Inpatient Medications: Scheduled Meds:  chlorhexidine  15 mL Mouth/Throat Once   Or   mouth rinse  15 mL Mouth Rinse Once   Chlorhexidine Gluconate Cloth  6 each Topical Once   And   Chlorhexidine Gluconate Cloth  6 each Topical Once   Continuous Infusions:  sodium chloride     lactated ringers 10 mL/hr at 10/28/22 0933   PRN Meds: insulin aspart  Allergies:   No Known Allergies  Social History:   Social History   Socioeconomic History   Marital status: Married    Spouse name: Not on file   Number of children: Not on file   Years of education: Not on file   Highest education level: Not on file  Occupational History   Not on file  Tobacco Use   Smoking status: Former    Types: Cigarettes    Quit date: 05/05/2002    Years since quitting: 20.4    Passive exposure: Never   Smokeless tobacco: Never  Vaping Use   Vaping Use: Never used  Substance and Sexual Activity   Alcohol use: Yes    Alcohol/week: 0.0 standard drinks of alcohol    Comment: beer every now and then   Drug use: No   Sexual activity: Yes  Other Topics Concern   Not on file  Social History Narrative   No Regular exercise   Social Determinants of Health   Financial Resource Strain: Low Risk  (07/06/2022)   Overall Financial Resource Strain (CARDIA)    Difficulty of Paying Living Expenses: Not hard at all  Food Insecurity: No Food Insecurity (07/06/2022)   Hunger Vital Sign    Worried About Running Out of Food in the Last Year: Never true    Ran Out of Food in the Last Year: Never true  Transportation Needs: No Transportation  Needs (07/06/2022)   PRAPARE - Transportation    Lack  of Transportation (Medical): No    Lack of Transportation (Non-Medical): No  Physical Activity: Insufficiently Active (07/06/2022)   Exercise Vital Sign    Days of Exercise per Week: 5 days    Minutes of Exercise per Session: 20 min  Stress: No Stress Concern Present (07/06/2022)   [employer] of Occupational Health - Occupational Stress Questionnaire    Feeling of Stress : Not at all  Social Connections: Socially Integrated (07/06/2022)   Social Connection and Isolation Panel [NHANES]    Frequency of Communication with Friends and Family: More than three times a week    Frequency of Social Gatherings with Friends and Family: Once a week    Attends Religious Services: More than 4 times per year    Active Member of [employer] or Organizations: Yes    Attends Engineer, structural: More than 4 times per year    Marital Status: Married  Catering manager Violence: Not At Risk (07/06/2022)   Humiliation, Afraid, Rape, and Kick questionnaire    Fear of Current or Ex-Partner: No    Emotionally Abused: No    Physically Abused: No    Sexually Abused: No    Family History:   Family History  Problem Relation Age of Onset   Stroke Mother    Diabetes Father    COPD Father    Arthritis Father    Heart disease Father    Coronary artery disease Other        Male 1st degree relative <50   Diabetes Other        1st degree relative   Stroke Other        Male 1st degree relative<50   Deep vein thrombosis Daughter    Pulmonary fibrosis Daughter      ROS:  Please see the history of present illness.  Review of Systems  Unable to perform ROS: Intubated     Physical Exam/Data:   Vitals:   10/28/22 0728  BP: 114/68  Pulse: 65  Resp: 17  Temp: 98.2 F (36.8 C)  TempSrc: Oral  SpO2: 98%  Weight: 97.5 kg  Height: 6' (1.829 m)   No intake or output data in the 24 hours ending 10/28/22 1044    10/28/2022    7:28 AM 10/22/2022    10:57 AM 10/12/2022    1:13 PM  Last 3 Weights  Weight (lbs) 215 lb 219 lb 219 lb  Weight (kg) 97.523 kg 99.338 kg 99.338 kg     Body mass index is 29.16 kg/m.  General: 67 y.o. Caucasian male who is intubated but awake, HEENT: Normocephalic and atraumatic. Sclera clear. EOMs intact. Neck: Supple. No JVD. Heart: RRR. Distinct S1 and S2. No murmurs, gallops, or rubs. Radial and distal pedal pulses 2+ and equal bilaterally. Lungs: Intubated. Clear to ausculation bilaterally. No wheezes, rhonchi, or rales.  Abdomen: Soft, non-distended, and non-tender to palpation.  Extremities: No lower extremity edema.    Skin: Warm and dry. Neuro: Intubated but awake and alert following commands. No focal deficits. Psych: Intubated but awake and alert following commands. Answers questions by shaking his head.   EKG:  The EKG was personally reviewed and demonstrates:  Sinus bradycardia, rate 55 bpm, with Q waves in lead III and minimal ST depression in leads V4-V6 as well as nonspecific T wave changes in inferior leads and 1 and aVL. Telemetry:  Telemetry was personally reviewed and demonstrates:  Normal sinus rhythm with rates in the 70s.  Relevant CV Studies:  Echocardiogram 12/17/2013: Study Conclusions: - Left ventricle: The cavity size was mildly dilated. Wall    thickness was normal. Systolic function was moderately to    severely reduced. The estimated ejection fraction was in the    range of 30% to 35%. Diffuse hypokinesis. There is akinesis of    the inferolateral myocardium. Doppler parameters are consistent    with abnormal left ventricular relaxation (grade 1 diastolic    dysfunction).  - Left atrium: The atrium was mildly dilated.  - Right ventricle: The cavity size was mildly dilated.  - Right atrium: The atrium was mildly dilated.   Impressions: - Technically difficult; contrast used; global hypokinesis and    inferolateral akinesis; EF 30-35.  _______________  Myoview  11/03/2016: Nuclear stress EF: 27%. Blood pressure demonstrated a normal response to exercise. There was no ST segment deviation noted during stress. Findings consistent with prior myocardial infarction. This is a high risk study. The left ventricular ejection fraction is severely decreased (<30%).   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 no ischemia EF 27% _______________   Laboratory Data:  High Sensitivity Troponin:  No results for input(s): "TROPONINIHS" in the last 720 hours.   Chemistry Recent Labs  Lab 10/22/22 1200  NA 138  K 4.4  CL 105  CO2 26  GLUCOSE 150*  BUN 15  CREATININE 1.07  CALCIUM 9.2  GFRNONAA >60  ANIONGAP 7    Recent Labs  Lab 10/22/22 1200  PROT 7.1  ALBUMIN 4.0  AST 21  ALT 25  ALKPHOS 47  BILITOT 0.9   Lipids No results for input(s): "CHOL", "TRIG", "HDL", "LABVLDL", "LDLCALC", "CHOLHDL" in the last 168 hours.  Hematology Recent Labs  Lab 10/22/22 1200  WBC 7.5  RBC 4.99  HGB 16.2  HCT 48.5  MCV 97.2  MCH 32.5  MCHC 33.4  RDW 12.2  PLT 183   Thyroid No results for input(s): "TSH", "FREET4" in the last 168 hours.  BNPNo results for input(s): "BNP", "PROBNP" in the last 168 hours.  DDimer No results for input(s): "DDIMER" in the last 168 hours.   Radiology/Studies:  HYBRID OR IMAGING ([facility] ONLY)  Result Date: 10/28/2022 There is no interpretation for this exam.  This order is for images obtained during a surgical procedure.  Please See "Surgeries" Tab for more information regarding the procedure.     Assessment and Plan:   Cardiac Arrest VT Patient presented for planned right transcarotid artery revascularization with Dr. Lenell Antu. Shortly after procedure started (after trying to obtain venous access), patient went into VT. Chest compressions were started and patient was shocked x1 with quick return of ROSC.  Patient then went had subsequent arrest again with quick return of ROSC without need for another shock. One  report reports second arrest was also VT but then there are also reports that patient brady'd down and then arrest. Procedure was aborted. He was started on pressor and transferred to the ICU. Post-arrest EKG showed sinus bradycardia, rate 55 bpm, with Q waves in lead III and minimal ST depression in leads V4-V6 as well as nonspecific T wave changes in inferior leads and 1 and aVL. High-sensitivity troponin and stat Echo pending. - Telemetry shows no recurrent arrhythmias. Rare PVCs.  - Denies any chest pain prior to event. - Discussed with Dr. Elberta Fortis - OK to hold off on Amiodarone for now given no recurrence. However, low threshold to start this if he starts having a lot of ventricular ectopy. - Initial  troponin 73, will trend to peak - Echocardiogram shows EF less than 20% - Plan LHC/RHC tomorrow - Cardiac MRI  CAD s/p CABG History of CABG x+4 in 2003. Last ischemic evaluation was a Myoview in 2018 which showed a large inferior lateral wall infarct from the apex to the base but no ischemia. Presented for vascular surgery that was aborted due to cardiac arrest as described  above. - Patient denies chest pain prior to surgery but reports some chest soreness now following chest compressions. - Echo shows EF <20% - Continue Aspirin. Will defer resuming home Plavix to Vascular Surgery. - Restart home Crestor - Plan LHC/RHC tomorrow  Acute on Chronic Combined Heart Failure Last Echo in 2015 showed EF of 30-35% with diffuse hypokinesis and akinesis of the inferolateral Myocardium as well as grade 1 diastolic dysfunction.  Echo this admission shows EF less than 20% - Plan cardiac MRI for further evaluation of cardiomyopathy - Euvolemic on exam.  - Home GDMT (Entresto, Coreg, Isordil, Spironolactone, Jardiance) on hold given cardiac arrest requiring intubation and pressors. Can gradually resume as patient and BP recovers and he is able to take PO medications.  Hypertension History of hypertension  but currently on pressors and intubated after cardiac arrest. - Can gradually restart home BP medications as able.  Hyperlipidemia - Restart home Crestor when able to take PO medications.  Type 2 Diabetes Mellitus - Management per primary team.     Risk Assessment/Risk Scores:    For questions or updates, please contact [facility] Please consult [email or web address] for contact info under    Signed, Corrin Parker, PA-C  10/28/2022 10:44 AM   Patient seen and examined.  Agree with above documentation.  [name] is a 67 year old male with a history of CAD status post CABG in 2003, chronic combined heart failure (EF 30 to 35%), carotid artery disease, PAD, T2DM, hypertension who were consulted by Dr. Lenell Antu for evaluation of cardiac arrest.  He underwent CABG in 2003 (free LIMA to the diagonal, RIMA to LAD, SVG-OM 2, SVG-PDA).  Most recent ischemic evaluation was in 2018 underwent Myoview which showed large inferolateral wall infarct but no ischemia.  Most recent echocardiogram in 2015 showed EF 30 to 35%.  He has a history of PAD status post right iliofemoral artery endarterectomy in May and patch angioplasty and stenting of bilateral common iliac arteries and 04/2021.  Has carotid artery disease status post right CEA and patch angioplasty in 11/21/2016.  He presented today for right transcarotid artery revascularization with Dr. Lenell Antu.  Femoral sheath was being placed when patient went into VT.  ROSC obtained after 1 shock.  However he subsequently went back into VT and compressions were started and patient received an additional shock.  He was intubated and transferred to [facility].  He was able to be extubated this afternoon.  Initially required pressor support, has been weaned off.  Most recent vitals show BP 105/63, pulse 73, SpO2 96% on room air.  Labs notable for creatinine 1.05, sodium 134, glucose 228, lactate 1.4, WBC 10, hemoglobin 14.5, A1c 6.5, troponin 73.  EKG shows normal  sinus rhythm, rate 77, low voltage, nonspecific T wave flattening, QTc 450.  Echo today shows EF less than 20%, grade 2 diastolic dysfunction, moderately reduced RV function, no significant valvular disease.  On exam, patient is alert and oriented, regular rate and rhythm, no murmurs, lungs CTAB, no LE edema.  Suspect his VT arrest occurred due to scar from his ischemic cardiomyopathy.  Will plan for LHC to rule out any ischemia.  Check cardiac MRI.  Will follow-up results and plan EP consult for ICD.  Risks and benefits of cardiac catheterization have been discussed with the patient.  These include bleeding, infection, kidney damage, stroke, heart attack, death.  The patient understands these risks and is willing to proceed.   Little Ishikawa, MD

## 2022-10-28 NOTE — Anesthesia Procedure Notes (Signed)
Arterial Line Insertion Start/End4/25/2024 8:20 AM, 10/28/2022 8:25 AM Performed by: Marena Chancy, CRNA, CRNA  Preanesthetic checklist: patient identified, IV checked, site marked, risks and benefits discussed, surgical consent, monitors and equipment checked, pre-op evaluation, timeout performed and anesthesia consent Lidocaine 1% used for infiltration Right, radial was placed Catheter size: 20 G Hand hygiene performed  and maximum sterile barriers used   Attempts: 1 Procedure performed without using ultrasound guided technique. Following insertion, dressing applied and Biopatch. Post procedure assessment: normal  Patient tolerated the procedure well with no immediate complications.

## 2022-10-28 NOTE — Interval H&P Note (Signed)
History and Physical Interval Note:  10/28/2022 9:25 AM  [name]  has presented today for surgery, with the diagnosis of Carotid artery stenosis, symptomatic, right.  The various methods of treatment have been discussed with the patient and family. After consideration of risks, benefits and other options for treatment, the patient has consented to  Procedure(s): Right Transcarotid Artery Revascularization (Right) as a surgical intervention.  The patient's history has been reviewed, patient examined, no change in status, stable for surgery.  I have reviewed the patient's chart and labs.  Questions were answered to the patient's satisfaction.     Leonie Douglas

## 2022-10-28 NOTE — Progress Notes (Signed)
Pt's care team working on pt, will wait until ready.

## 2022-10-28 NOTE — Progress Notes (Signed)
ANTICOAGULATION CONSULT NOTE - Initial Consult  Pharmacy Consult for Heparin Indication: ACS/STEMI  No Known Allergies  Patient Measurements: Height: 6' (182.9 cm) Weight: 97.5 kg (215 lb) IBW/kg (Calculated) : 77.6 Heparin Dosing Weight: actual weight  Vital Signs: Temp: 98.2 F (36.8 C) (04/25 0728) Temp Source: Oral (04/25 0728) BP: 114/68 (04/25 0728) Pulse Rate: 75 (04/25 1145)  Labs: Recent Labs    10/28/22 1150  HGB 14.5  HCT 42.7  PLT 189  APTT 34  LABPROT 15.3*  INR 1.2    Estimated Creatinine Clearance: 80 mL/min (by C-G formula based on SCr of 1.07 mg/dL).   Medical History: Past Medical History:  Diagnosis Date   Aortic valve sclerosis    aortic valve sclerosis   Arthritis    CAD (coronary artery disease)    Myoview 5/18: 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 no ischemia EF 27%   Carotid artery disease    chronic total occlusion LCCA, retrograde flow from LECA to LICA, ,, 0-39%LICA, s/p Right CEA 2018   CHF (congestive heart failure)    Closed head injury with concussion    x2   Complication of anesthesia    Low blood pressure after colonoscopy   Diabetes mellitus without complication    Dizziness    Ejection fraction < 50%    EF 40%, echo 2006, no ICD needed  / EF 40%, echo, 11/2009   Former cigarette smoker    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 Georganna Skeans   Sleep disorder    Dr Shelle Iron, lose weight and consider sleep study later   Sleep disorder    TIA (transient ischemic attack)    by history   Volume overload     Medications:  Scheduled:   aspirin  325 mg Per Tube Once   Followed by   [START ON 10/29/2022] aspirin  81 mg Per Tube Daily   docusate  100 mg Per Tube BID   famotidine  20 mg Per Tube BID   heparin  4,000 Units  Intravenous Once   insulin aspart  0-9 Units Subcutaneous Q4H   oxidized cellulose  1 each Topical STAT   pantoprazole  40 mg Oral Daily   polyethylene glycol  17 g Per Tube Daily   potassium chloride  20-40 mEq Oral Once   Infusions:   dextrose 5 % and 0.45 % NaCl with KCl 20 mEq/L     epinephrine     fentaNYL infusion INTRAVENOUS 50 mcg/hr (10/28/22 1225)   heparin 1,200 Units/hr (10/28/22 1234)   lactated ringers     norepinephrine (LEVOPHED) Adult infusion 2 mcg/min (10/28/22 1235)   phenylephrine (NEO-SYNEPHRINE) Adult infusion     PRN: alum & mag hydroxide-simeth, docusate sodium, fentaNYL, guaiFENesin-dextromethorphan, hydrALAZINE, HYDROmorphone (DILAUDID) injection, labetalol, metoprolol tartrate, midazolam, ondansetron, perflutren lipid microspheres (DEFINITY) IV suspension, phenol, polyethylene glycol  Assessment: 67 yo male who presented for TCAR, but procedure was cancelled due to pulseless VT requiring shock and brief compressions, recurrent arrest with ROSC. Pharmacy is consulted to dose IV heparin for r/o ACS. No PTA anticoagulants, baseline aPTT, PT/INR, CBC wnl  Goal of Therapy:  Heparin level 0.3-0.7 units/ml Monitor platelets by anticoagulation protocol: Yes  Plan:  Give 4000 units bolus x 1 Start heparin infusion at 1200 units/hr Check anti-Xa level in 8 hours and daily while on heparin Continue to monitor H&H and platelets  Loralee Pacas, PharmD, BCPS Pharmacy: [phone] 10/28/2022,12:49 PM

## 2022-10-28 NOTE — Anesthesia Postprocedure Evaluation (Signed)
Anesthesia Post Note  Patient: [name]  Procedure(s) Performed: Right Transcarotid Artery Revascularization (Right)     Patient location during evaluation: SICU Anesthesia Type: General Level of consciousness: sedated Pain management: pain level controlled Vital Signs Assessment: vitals unstable Respiratory status: patient remains intubated per anesthesia plan Cardiovascular status: unstable Postop Assessment: no apparent nausea or vomiting Anesthetic complications: yes   Encounter Notable Events  Notable Event Outcome Phase Comment  Cardiogenic shock Unresolved Intraprocedure   Dysrhythmia Resolved in Lab Intraprocedure     Last Vitals:  Vitals:   10/28/22 1130 10/28/22 1145  BP:    Pulse: 79 75  Resp: (!) 21 (!) 21  Temp:    SpO2: 100% 100%    Last Pain:  Vitals:   10/28/22 0819  TempSrc:   PainSc: 0-No pain                 Kathan Kirker DANIEL

## 2022-10-28 NOTE — Procedures (Signed)
Extubation Procedure Note  Patient Details:   Name: [name] DOB: 02-12-1956 MRN: [id number]   Airway Documentation:    Vent end date: 10/28/22 Vent end time: 1310   Evaluation  O2 sats: stable throughout Complications: No apparent complications Patient did tolerate procedure well. Bilateral Breath Sounds: Clear   Yes  Patient extubated per MD order. Positive cuff leak. No stridor noted. Vitals are stable on 3L. RN at bedside.  Koki Buxton H Aryanne Gilleland 10/28/2022, 1:10 PM

## 2022-10-28 NOTE — Progress Notes (Signed)
ANTICOAGULATION CONSULT NOTE  Pharmacy Consult for Heparin Indication: ACS/STEMI  No Known Allergies  Patient Measurements: Height: 6' (182.9 cm) Weight: 97.5 kg (215 lb) IBW/kg (Calculated) : 77.6 Heparin Dosing Weight: actual weight  Vital Signs: Temp: 97.9 F (36.6 C) (04/25 2000) Temp Source: Bladder (04/25 2000) BP: 118/75 (04/25 2000) Pulse Rate: 78 (04/25 2000)  Labs: Recent Labs    10/28/22 1150 10/28/22 1152 10/28/22 1437 10/28/22 1642 10/28/22 2140  HGB 14.5 14.3 14.6  --   --   HCT 42.7 42.0 43.0  --   --   PLT 189  --   --   --   --   APTT 34  --   --   --   --   LABPROT 15.3*  --   --   --   --   INR 1.2  --   --   --   --   HEPARINUNFRC  --   --   --   --  0.36  CREATININE 1.05  --   --   --   --   TROPONINIHS 73*  --   --  261*  --      Estimated Creatinine Clearance: 81.5 mL/min (by C-G formula based on SCr of 1.05 mg/dL).   Assessment: 67 yo male who presented for TCAR, but procedure was cancelled due to pulseless VT requiring shock and brief compressions, recurrent arrest with ROSC. Pharmacy is consulted to dose IV heparin for r/o ACS. No PTA anticoagulants, baseline aPTT, PT/INR, CBC wnl.  Heparin level 0.36 (therapeutic) on infusion at 1200 units/hr. No bleeding noted.  Goal of Therapy:  Heparin level 0.3-0.7 units/ml Monitor platelets by anticoagulation protocol: Yes   Plan:  Continue heparin at 1200 units/hr F/u a.m. heparin level to confirm therapeutic  Christoper Fabian, PharmD, BCPS Please see amion for complete clinical pharmacist phone list 10/28/2022,10:14 PM

## 2022-10-28 NOTE — Procedures (Signed)
Central Venous Catheter Insertion Procedure Note  [name]  [id number]  05-17-1956  Date:10/28/22  Time:11:18 AM   Provider Performing:Jacobi Ryant Judie Petit Janyth Contes   Procedure: Insertion of Non-tunneled Central Venous [phone]) with US guidance ([id number])   Indication(s) Medication administration  Consent Risks of the procedure as well as the alternatives and risks of each were explained to the patient and/or caregiver.  Consent for the procedure was obtained and is signed in the bedside chart  Anesthesia Topical only with 1% lidocaine   Timeout Verified patient identification, verified procedure, site/side was marked, verified correct patient position, special equipment/implants available, medications/allergies/relevant history reviewed, required imaging and test results available.  Sterile Technique Maximal sterile technique including full sterile barrier drape, hand hygiene, sterile gown, sterile gloves, mask, hair covering, sterile ultrasound probe cover (if used).  Procedure Description Area of catheter insertion was cleaned with chlorhexidine and draped in sterile fashion.  With real-time ultrasound guidance a central venous catheter was placed into the right internal jugular vein. Nonpulsatile blood flow and easy flushing noted in all ports.  The catheter was sutured in place and sterile dressing applied.  Complications/Tolerance None; patient tolerated the procedure well. Chest X-ray is ordered to verify placement for internal jugular or subclavian cannulation.   Chest x-ray is not ordered for femoral cannulation.  EBL Minimal  Specimen(s) None

## 2022-10-28 NOTE — Op Note (Signed)
DATE OF SERVICE: 10/28/2022  PATIENT:  [name]  67 y.o. male  PRE-OPERATIVE DIAGNOSIS:  symptomatic right carotid artery stenosis. Occluded left carotid artery.  POST-OPERATIVE DIAGNOSIS:  Same  PROCEDURE:   Aborted right transcarotid artery revascularization  SURGEON:  Surgeon(s) and Role:    * Leonie Douglas, MD - Primary  ASSISTANT: Lianne Cure, PA-C  An experienced assistant was required given the complexity of this procedure and the standard of surgical care. My assistant helped with exposure through counter tension, suctioning, ligation and retraction to better visualize the surgical field.  My assistant expedited sewing during the case by following my sutures. Wherever I use the term "we" in the report, my assistant actively helped me with that portion of the procedure.  ANESTHESIA:   general  EBL: 0mL  BLOOD ADMINISTERED:none  DRAINS: none   LOCAL MEDICATIONS USED:  NONE  SPECIMEN:  none  COUNTS: confirmed correct.  TOURNIQUET:  none  PATIENT DISPOSITION:  PACU - hemodynamically stable.   Delay start of Pharmacological VTE agent (>24hrs) due to surgical blood loss or risk of bleeding: no  INDICATION FOR PROCEDURE: [name] is a 67 y.o. male with symptomatic right carotid artery stenosis. After careful discussion of risks, benefits, and alternatives the patient was offered TCAR. The patient understood and wished to proceed.  OPERATIVE FINDINGS:  Shortly after placing left common femoral sheath, the patient entered a pulseless ventricular tachycardia. Wire withdrawn and sheath removed.  ACLS was rapidly initiated.  ROSC achieved after about 45 seconds of compressions, one dose of epinephrine and defibrillation.  Patient arrested again about 5-10 minutes later. ROSC again achieved.  Patient transferred to unit.  DESCRIPTION OF PROCEDURE: After identification of the patient in the pre-operative holding area, the patient was transferred to the  operating room. The patient was positioned supine on the operating room table. Anesthesia was induced. The neck and groins were prepped and draped in standard fashion. A surgical pause was performed confirming correct patient, procedure, and operative location.  Using ultrasound guidance the left common femoral vein was cannulated with micropuncture technique. The micro sheath was introduced into the vein with seldinger technique. The wire was exchanged for a benson wire. The flow reversal sheath was introduced into the vein. Shortly after this, the patient entered a ventricular tachycardia rhythm. I withdrew the wire, but this did not stop the arrythmia. ACLS was initiated. The sheath was removed. The case was aborted.   The patient was transferred to the ICU in critical condition.  Rande Brunt. Lenell Antu, MD [facility] Phone Number: [phone] 10/28/2022 11:00 AM

## 2022-10-28 NOTE — Transfer of Care (Signed)
Immediate Anesthesia Transfer of Care Note  Patient: [name]  Procedure(s) Performed: Right Transcarotid Artery Revascularization (Right)  Patient Location: SICU  Anesthesia Type:General  Level of Consciousness: Patient remains intubated per anesthesia plan  Airway & Oxygen Therapy: Patient placed on Ventilator (see vital sign flow sheet for setting)  Post-op Assessment: Post -op Vital signs reviewed and stable  Post vital signs: stable  Last Vitals:  Vitals Value Taken Time  BP 130/90 10/28/22 1055  Temp    Pulse 80 10/28/22 1059  Resp 19 10/28/22 1059  SpO2 100 % 10/28/22 1059  Vitals shown include unvalidated device data.  Last Pain:  Vitals:   10/28/22 0819  TempSrc:   PainSc: 0-No pain         Complications: No notable events documented.

## 2022-10-28 NOTE — Anesthesia Procedure Notes (Signed)
Procedure Name: Intubation Date/Time: 10/28/2022 10:00 AM  Performed by: Dorie Rank, CRNAPre-anesthesia Checklist: Patient identified, Emergency Drugs available, Suction available, Patient being monitored and Timeout performed Patient Re-evaluated:Patient Re-evaluated prior to induction Oxygen Delivery Method: Circle system utilized Preoxygenation: Pre-oxygenation with 100% oxygen Induction Type: IV induction Ventilation: Mask ventilation without difficulty Laryngoscope Size: Mac and 4 Grade View: Grade II Tube type: Oral Tube size: 7.5 mm Number of attempts: 1 Airway Equipment and Method: Stylet Placement Confirmation: ETT inserted through vocal cords under direct vision, positive ETCO2 and breath sounds checked- equal and bilateral Secured at: 23 cm Tube secured with: Tape Dental Injury: Teeth and Oropharynx as per pre-operative assessment

## 2022-10-28 NOTE — H&P (Signed)
NAME:  [name], MRN:  [id number], DOB:  1955/08/27, LOS: 0 ADMISSION DATE:  10/28/2022, CONSULTATION DATE:  10/28/22 REFERRING MD:  Lenell Antu, CHIEF COMPLAINT:  cardiac arrest   History of Present Illness:  67 year old man with extensive vascular history detailed in Dr. Verita Lamb note, DM, ischemic cardiomyopathy w/ prior CABG, DM, HTN who presented today for treatment of his symptomatic carotid artery stenosis w/ TCAR.  During femoral vein wiring went into pulseless VT requiring shock and brief compressions.  Subsequently brady'd and had recurrent arrest again with quick ROSC, started on epi drip and sent to ICU for further management.  Procedure cancelled due to hemodynamic instability.  Pertinent  Medical History  As above  Significant Hospital Events: Including procedures, antibiotic start and stop dates in addition to other pertinent events   10/28/22 admit  Interim History / Subjective:  Consult/Admit  Objective   Blood pressure 114/68, pulse 83, temperature 98.2 F (36.8 C), temperature source Oral, resp. rate 17, height 6' (1.829 m), weight 97.5 kg, SpO2 91 %.    Vent Mode: PRVC FiO2 (%):  [100 %] 100 % Set Rate:  [24 bmp] 24 bmp Vt Set:  [620 mL] 620 mL PEEP:  [8 cmH20] 8 cmH20 Plateau Pressure:  [24 cmH20] 24 cmH20   Intake/Output Summary (Last 24 hours) at 10/28/2022 1101 Last data filed at 10/28/2022 1100 Gross per 24 hour  Intake 250 ml  Output --  Net 250 ml   Filed Weights   10/28/22 0728  Weight: 97.5 kg    Examination: General: no distress sedated on vent HENT: ETT minimal secretions Lungs: + bilateral breath sounds, not triggering vent at present Cardiovascular: RRR, ext lukewarm, RLE needs doppler to find pusle Abdomen: soft, hypoactive bs Extremities: L femoral access site looks soft Neuro: starting to move around Skin: slightly pale  Labs/imaging pending  Resolved Hospital Problem list   N/a  Assessment & Plan:  IHCA VT x 2 brief; r/o  ACS Hx CAD, prior CABG Hx HTN Severe PAD, multiple prior revascularizations RLE DM2 Post arrest vent management  - Switch from epi/neo to levophed - Stat echo/EKG, trops, lactate, CXR, BMP, CBC, type screen - CVL - Wife updated in waiting room, consents for blood and CVL after risk/benefit explained - Vent bundle, fent and PRN versed for sedation - Cardiology to be consulted by [facility]  Best Practice (right click and "Reselect all SmartList Selections" daily)   Diet/type: NPO DVT prophylaxis: not indicated GI prophylaxis: N/A Lines: Central line Foley:  Yes, and it is still needed Code Status:  full code Last date of multidisciplinary goals of care discussion [updating wife through day]  Labs   CBC: Recent Labs  Lab 10/22/22 1200  WBC 7.5  HGB 16.2  HCT 48.5  MCV 97.2  PLT 183    Basic Metabolic Panel: Recent Labs  Lab 10/22/22 1200  NA 138  K 4.4  CL 105  CO2 26  GLUCOSE 150*  BUN 15  CREATININE 1.07  CALCIUM 9.2   GFR: Estimated Creatinine Clearance: 80 mL/min (by C-G formula based on SCr of 1.07 mg/dL). Recent Labs  Lab 10/22/22 1200  WBC 7.5    Liver Function Tests: Recent Labs  Lab 10/22/22 1200  AST 21  ALT 25  ALKPHOS 47  BILITOT 0.9  PROT 7.1  ALBUMIN 4.0   No results for input(s): "LIPASE", "AMYLASE" in the last 168 hours. No results for input(s): "AMMONIA" in the last 168 hours.  ABG  Component Value Date/Time   PHART 7.372 04/28/2021 0936   PCO2ART 36.5 04/28/2021 0936   PO2ART 105 04/28/2021 0936   HCO3 20.7 04/28/2021 0936   ACIDBASEDEF 3.6 (H) 04/28/2021 0936   O2SAT 97.7 04/28/2021 0936     Coagulation Profile: Recent Labs  Lab 10/22/22 1200  INR 1.1    Cardiac Enzymes: No results for input(s): "CKTOTAL", "CKMB", "CKMBINDEX", "TROPONINI" in the last 168 hours.  HbA1C: Hemoglobin A1C  Date/Time Value Ref Range Status  07/23/2021 10:48 AM 6.3 (A) 4.0 - 5.6 % Final  04/16/2021 09:22 AM 7.7 (A) 4.0 - 5.6 % Final    Hgb A1c MFr Bld  Date/Time Value Ref Range Status  09/29/2021 03:29 PM 7.2 (H) 4.6 - 6.5 % Final    Comment:    Glycemic Control Guidelines for People with Diabetes:Non Diabetic:  <6%Goal of Therapy: <7%Additional Action Suggested:  >8%   03/31/2021 04:10 PM 8.0 (H) 4.6 - 6.5 % Final    Comment:    Glycemic Control Guidelines for People with Diabetes:Non Diabetic:  <6%Goal of Therapy: <7%Additional Action Suggested:  >8%     CBG: Recent Labs  Lab 10/22/22 1101 10/28/22 0732  GLUCAP 170* 154*    Review of Systems:   Intubated/sedated  Past Medical History:  He,  has a past medical history of Aortic valve sclerosis, Arthritis, CAD (coronary artery disease), Carotid artery disease, CHF (congestive heart failure), Closed head injury with concussion, Complication of anesthesia, Diabetes mellitus without complication, Dizziness, Ejection fraction < 50%, Former cigarette smoker, CABG (01/2002), Hyperlipidemia, Hypertension, Mitral regurgitation, Myocardial infarction, Peripheral vascular disease, Sleep disorder, Sleep disorder, TIA (transient ischemic attack), and Volume overload.   Surgical History:   Past Surgical History:  Procedure Laterality Date   CHOLECYSTECTOMY     COLONOSCOPY  03/19/2009   CORONARY ARTERY BYPASS GRAFT     ENDARTERECTOMY Right 11/05/2016   Procedure: ENDARTERECTOMY RIGHT CAROTID;  Surgeon: Larina Earthly, MD;  Location: [facility] OR;  Service: Vascular;  Laterality: Right;   ENDARTERECTOMY FEMORAL Right 04/29/2021   Procedure: RIGHT ILIOFEMORAL ARTERY ENDARTERECTOMY WITH PROFUNDOPLASTY;  Surgeon: Leonie Douglas, MD;  Location: [facility] OR;  Service: Vascular;  Laterality: Right;   INSERTION OF ILIAC STENT Right 04/29/2021   Procedure: Ronny Flurry INSERTION OF BILATERAL COMMON ILIAC ARTERY KISSING STENTS;  Surgeon: Leonie Douglas, MD;  Location: [facility] OR;  Service: Vascular;  Laterality: Right;   PATCH ANGIOPLASTY Right 11/05/2016   Procedure: RIGHT CAROTID ARTERY PATCH  ANGIOPLASTY USING HEMASHIELD PLATINUM FINESSE PATCH;  Surgeon: Larina Earthly, MD;  Location: [facility] OR;  Service: Vascular;  Laterality: Right;   PATCH ANGIOPLASTY Right 04/29/2021   Procedure: PATCH ANGIOPLASTY OF RIGHT COMMON FEMORAL ARTERY UISNG XENOSURE BOVINE PERICARDIUM PACTH;  Surgeon: Leonie Douglas, MD;  Location: [facility] OR;  Service: Vascular;  Laterality: Right;   SHOULDER ARTHROSCOPY Right 05/25/2018   Procedure: RIGHT ARTHROSCOPY SHOULDER LABRAL DEBRIDEMENT, rotator cuff debridement,  BICEPS TENOTOMY and subacromial decompression;  Surgeon: Jones Broom, MD;  Location: [facility] OR;  Service: Orthopedics;  Laterality: Right;   ULTRASOUND GUIDANCE FOR VASCULAR ACCESS Left 04/29/2021   Procedure: ULTRASOUND GUIDANCE FOR VASCULAR ACCESS, LEFT FEMORAL ARTERY;  Surgeon: Leonie Douglas, MD;  Location: [facility] OR;  Service: Vascular;  Laterality: Left;     Social History:   reports that he quit smoking about 20 years ago. His smoking use included cigarettes. He has never been exposed to tobacco smoke. He has never used smokeless tobacco. He reports current alcohol use. He reports  that he does not use drugs.   Family History:  His family history includes Arthritis in his father; COPD in his father; Coronary artery disease in an other family member; Deep vein thrombosis in his daughter; Diabetes in his father and another family member; Heart disease in his father; Pulmonary fibrosis in his daughter; Stroke in his mother and another family member.   Allergies No Known Allergies   Home Medications  Prior to Admission medications   Medication Sig Start Date End Date Taking? Authorizing Provider  aspirin 81 MG chewable tablet Chew 81 mg by mouth daily. 06/29/18  Yes [provider]  b complex vitamins capsule Take 1 capsule by mouth daily.   Yes [provider]  carvedilol (COREG) 25 MG tablet TAKE 1 TABLET (25 MG TOTAL) BY MOUTH 2 (TWO) TIMES DAILY WITH A MEAL. 02/06/18  Yes Plotnikov,  Georgina Quint, MD  Cholecalciferol (VITAMIN D3) 50 MCG (2000 UT) TABS Take 2,000 Units by mouth daily.   Yes [provider]  clopidogrel (PLAVIX) 75 MG tablet Take 1 tablet (75 mg total) by mouth daily. 10/12/22  Yes Leonie Douglas, MD  empagliflozin (JARDIANCE) 25 MG TABS tablet Take 25 mg by mouth daily. Patient taking differently: Take 12.5 mg by mouth daily. 08/11/17  Yes Romero Belling, MD  isosorbide dinitrate (ISORDIL) 10 MG tablet Take 10 mg by mouth 3 (three) times daily. 02/13/21  Yes [provider]  magnesium oxide (MAG-OX) 400 (240 Mg) MG tablet Take 400 mg by mouth at bedtime.   Yes [provider]  pantoprazole (PROTONIX) 40 MG tablet TAKE 1 TABLET (40 MG TOTAL) BY MOUTH DAILY. Patient taking differently: Take 40 mg by mouth at bedtime. 07/03/18  Yes Plotnikov, Georgina Quint, MD  rosuvastatin (CRESTOR) 20 MG tablet Take 10 mg by mouth daily. 03/17/21  Yes [provider]  sacubitril-valsartan (ENTRESTO) 97-103 MG Take 1 tablet by mouth 2 (two) times daily. 02/13/21  Yes [provider]  Semaglutide,0.25 or 0.5MG /DOS, (OZEMPIC, 0.25 OR 0.5 MG/DOSE,) 2 MG/1.5ML SOPN Inject 0.5 mg into the skin once a week. If unavailable, please recommend alternative. Patient taking differently: Inject 0.5 mg into the skin once a week. If unavailable, please recommend alternative. SAT 10/06/21  Yes Romero Belling, MD  spironolactone (ALDACTONE) 25 MG tablet Take 25 mg by mouth daily.    Yes [provider]  diclofenac Sodium (VOLTAREN) 1 % GEL Apply 2 g topically daily as needed (pain).    [provider]  linagliptin (TRADJENTA) 5 MG TABS tablet Take 1 tablet (5 mg total) by mouth daily. Patient not taking: Reported on 10/21/2022 04/16/21   Romero Belling, MD     Critical care time: 34 mins

## 2022-10-29 ENCOUNTER — Inpatient Hospital Stay (HOSPITAL_COMMUNITY): Payer: Medicare Other

## 2022-10-29 ENCOUNTER — Encounter (HOSPITAL_COMMUNITY): Payer: Self-pay | Admitting: Certified Registered"

## 2022-10-29 ENCOUNTER — Encounter (HOSPITAL_COMMUNITY): Payer: Self-pay | Admitting: Vascular Surgery

## 2022-10-29 ENCOUNTER — Inpatient Hospital Stay (HOSPITAL_COMMUNITY)
Admission: RE | Disposition: A | Discharge status: Home Health | Payer: Self-pay | Source: Home / Self Care | Attending: Vascular Surgery

## 2022-10-29 DIAGNOSIS — I469 Cardiac arrest, cause unspecified: Secondary | ICD-10-CM

## 2022-10-29 DIAGNOSIS — I5043 Acute on chronic combined systolic (congestive) and diastolic (congestive) heart failure: Secondary | ICD-10-CM | POA: Diagnosis not present

## 2022-10-29 DIAGNOSIS — I472 Ventricular tachycardia, unspecified: Secondary | ICD-10-CM | POA: Diagnosis not present

## 2022-10-29 DIAGNOSIS — I2510 Atherosclerotic heart disease of native coronary artery without angina pectoris: Secondary | ICD-10-CM | POA: Diagnosis not present

## 2022-10-29 HISTORY — PX: RIGHT/LEFT HEART CATH AND CORONARY/GRAFT ANGIOGRAPHY: CATH118267

## 2022-10-29 LAB — POCT I-STAT 7, (LYTES, BLD GAS, ICA,H+H)
Acid-base deficit: 5 mmol/L — ABNORMAL HIGH (ref 0.0–2.0)
Bicarbonate: 20.5 mmol/L (ref 20.0–28.0)
Calcium, Ion: 1.2 mmol/L (ref 1.15–1.40)
HCT: 40 % (ref 39.0–52.0)
Hemoglobin: 13.6 g/dL (ref 13.0–17.0)
O2 Saturation: 91 %
Potassium: 3.6 mmol/L (ref 3.5–5.1)
Sodium: 131 mmol/L — ABNORMAL LOW (ref 135–145)
TCO2: 22 mmol/L (ref 22–32)
pCO2 arterial: 40.1 mmHg (ref 32–48)
pH, Arterial: 7.316 — ABNORMAL LOW (ref 7.35–7.45)
pO2, Arterial: 67 mmHg — ABNORMAL LOW (ref 83–108)

## 2022-10-29 LAB — COMPREHENSIVE METABOLIC PANEL
ALT: 33 U/L (ref 0–44)
AST: 23 U/L (ref 15–41)
Albumin: 3.3 g/dL — ABNORMAL LOW (ref 3.5–5.0)
Alkaline Phosphatase: 39 U/L (ref 38–126)
Anion gap: 8 (ref 5–15)
BUN: 11 mg/dL (ref 8–23)
CO2: 22 mmol/L (ref 22–32)
Calcium: 8.8 mg/dL — ABNORMAL LOW (ref 8.9–10.3)
Chloride: 108 mmol/L (ref 98–111)
Creatinine, Ser: 0.85 mg/dL (ref 0.61–1.24)
GFR, Estimated: >60 mL/min (ref >60)
Glucose, Bld: 128 mg/dL — ABNORMAL HIGH (ref 70–99)
Potassium: 3.9 mmol/L (ref 3.5–5.1)
Sodium: 138 mmol/L (ref 135–145)
Total Bilirubin: 1.2 mg/dL (ref 0.3–1.2)
Total Protein: 6.2 g/dL — ABNORMAL LOW (ref 6.5–8.1)

## 2022-10-29 LAB — TROPONIN I (HIGH SENSITIVITY)
Troponin I (High Sensitivity): 222 ng/L (ref <18)
Troponin I (High Sensitivity): 294 ng/L (ref <18)

## 2022-10-29 LAB — POCT I-STAT EG7
Acid-base deficit: 2 mmol/L (ref 0.0–2.0)
Bicarbonate: 24.4 mmol/L (ref 20.0–28.0)
Calcium, Ion: 1.25 mmol/L (ref 1.15–1.40)
HCT: 40 % (ref 39.0–52.0)
Hemoglobin: 13.6 g/dL (ref 13.0–17.0)
O2 Saturation: 59 %
Potassium: 3.9 mmol/L (ref 3.5–5.1)
Sodium: 140 mmol/L (ref 135–145)
TCO2: 26 mmol/L (ref 22–32)
pCO2, Ven: 45.1 mmHg (ref 44–60)
pH, Ven: 7.341 (ref 7.25–7.43)
pO2, Ven: 33 mmHg (ref 32–45)

## 2022-10-29 LAB — GLUCOSE, CAPILLARY
Glucose-Capillary: 116 mg/dL — ABNORMAL HIGH (ref 70–99)
Glucose-Capillary: 172 mg/dL — ABNORMAL HIGH (ref 70–99)
Glucose-Capillary: 124 mg/dL — ABNORMAL HIGH (ref 70–99)
Glucose-Capillary: 114 mg/dL — ABNORMAL HIGH (ref 70–99)

## 2022-10-29 LAB — HIV ANTIBODY (ROUTINE TESTING W REFLEX): HIV Screen 4th Generation wRfx: NONREACTIVE

## 2022-10-29 LAB — HEPARIN LEVEL (UNFRACTIONATED): Heparin Unfractionated: 0.28 IU/mL — ABNORMAL LOW (ref 0.30–0.70)

## 2022-10-29 LAB — PROTIME-INR
INR: 1.1 (ref 0.8–1.2)
Prothrombin Time: 14.5 seconds (ref 11.4–15.2)

## 2022-10-29 LAB — MAGNESIUM: Magnesium: 1.9 mg/dL (ref 1.7–2.4)

## 2022-10-29 LAB — PHOSPHORUS: Phosphorus: 3.2 mg/dL (ref 2.5–4.6)

## 2022-10-29 LAB — CBC
HCT: 41.4 % (ref 39.0–52.0)
Hemoglobin: 14.4 g/dL (ref 13.0–17.0)
MCH: 33.3 pg (ref 26.0–34.0)
MCHC: 34.8 g/dL (ref 30.0–36.0)
MCV: 95.6 fL (ref 80.0–100.0)
Platelets: 133 10*3/uL — ABNORMAL LOW (ref 150–400)
RBC: 4.33 MIL/uL (ref 4.22–5.81)
RDW: 12.5 % (ref 11.5–15.5)
WBC: 9.8 10*3/uL (ref 4.0–10.5)
nRBC: 0 % (ref 0.0–0.2)

## 2022-10-29 SURGERY — RIGHT/LEFT HEART CATH AND CORONARY/GRAFT ANGIOGRAPHY
Anesthesia: LOCAL

## 2022-10-29 MED ORDER — SODIUM CHLORIDE 0.9% FLUSH: 3.0000 mL | INTRAVENOUS | Status: DC | PRN

## 2022-10-29 MED ORDER — GADOBUTROL 1 MMOL/ML IV SOLN
13.0000 mL | Freq: Once | INTRAVENOUS | Status: AC | PRN
  Administered 2022-10-29: 13 mL via INTRAVENOUS

## 2022-10-29 MED ORDER — MIDAZOLAM HCL 2 MG/2ML IJ SOLN
INTRAMUSCULAR | Status: DC | PRN
  Administered 2022-10-29: 2 mg via INTRAVENOUS

## 2022-10-29 MED ORDER — ACETAMINOPHEN 325 MG PO TABS
650.0000 mg | ORAL_TABLET | Freq: Four times a day (QID) | ORAL | Status: DC | PRN
  Administered 2022-10-29: 650 mg via ORAL
  Filled 2022-10-29: qty 2

## 2022-10-29 MED ORDER — LIDOCAINE HCL (PF) 1 % IJ SOLN
INTRAMUSCULAR | Status: DC | PRN
  Administered 2022-10-29: 5 mL
  Administered 2022-10-29: 10 mL

## 2022-10-29 MED ORDER — IOHEXOL 350 MG/ML SOLN
INTRAVENOUS | Status: DC | PRN
  Administered 2022-10-29: 80 mL via INTRA_ARTERIAL

## 2022-10-29 MED ORDER — CARVEDILOL 3.125 MG PO TABS
3.1250 mg | ORAL_TABLET | Freq: Two times a day (BID) | ORAL | Status: DC
  Administered 2022-10-29 – 2022-10-31 (×5): 3.125 mg via ORAL
  Filled 2022-10-29 (×4): qty 1

## 2022-10-29 MED ORDER — SACUBITRIL-VALSARTAN 24-26 MG PO TABS
1.0000 | ORAL_TABLET | Freq: Two times a day (BID) | ORAL | Status: DC
  Administered 2022-10-29 – 2022-11-03 (×10): 1 via ORAL
  Filled 2022-10-29 (×10): qty 1

## 2022-10-29 MED ORDER — HEPARIN SODIUM (PORCINE) 1000 UNIT/ML IJ SOLN
INTRAMUSCULAR | Status: AC
  Filled 2022-10-29: qty 20

## 2022-10-29 MED ORDER — FENTANYL CITRATE (PF) 100 MCG/2ML IJ SOLN
INTRAMUSCULAR | Status: DC | PRN
  Administered 2022-10-29: 25 ug via INTRAVENOUS

## 2022-10-29 MED ORDER — EMPAGLIFLOZIN 10 MG PO TABS
10.0000 mg | ORAL_TABLET | Freq: Every day | ORAL | Status: DC
  Administered 2022-10-29 – 2022-11-03 (×6): 10 mg via ORAL
  Filled 2022-10-29 (×6): qty 1

## 2022-10-29 MED ORDER — SPIRONOLACTONE 12.5 MG HALF TABLET
12.5000 mg | ORAL_TABLET | Freq: Every day | ORAL | Status: DC
  Administered 2022-10-29 – 2022-11-03 (×6): 12.5 mg via ORAL
  Filled 2022-10-29 (×6): qty 1

## 2022-10-29 MED ORDER — SODIUM CHLORIDE 0.9 % IV SOLN: 250.0000 mL | INTRAVENOUS | Status: DC | PRN

## 2022-10-29 MED ORDER — SODIUM CHLORIDE 0.9 % IV SOLN
INTRAVENOUS | Status: AC | PRN
  Administered 2022-10-29: 190 mL via INTRAVENOUS

## 2022-10-29 MED ORDER — SODIUM CHLORIDE 0.9% FLUSH
3.0000 mL | Freq: Two times a day (BID) | INTRAVENOUS | Status: DC
  Administered 2022-10-29 – 2022-11-01 (×7): 3 mL via INTRAVENOUS

## 2022-10-29 MED ORDER — MIDAZOLAM HCL 2 MG/2ML IJ SOLN
INTRAMUSCULAR | Status: AC
  Filled 2022-10-29: qty 2

## 2022-10-29 MED ORDER — HYDRALAZINE HCL 20 MG/ML IJ SOLN: 10.0000 mg | INTRAMUSCULAR | Status: AC | PRN

## 2022-10-29 MED ORDER — SODIUM CHLORIDE 0.9 % IV SOLN
INTRAVENOUS | Status: DC
Start: 2022-10-30 — End: 2022-10-29

## 2022-10-29 MED ORDER — HEPARIN (PORCINE) IN NACL 1000-0.9 UT/500ML-% IV SOLN
INTRAVENOUS | Status: DC | PRN
  Administered 2022-10-29 (×2): 500 mL via INTRA_ARTERIAL

## 2022-10-29 MED ORDER — SODIUM CHLORIDE 0.9% FLUSH
3.0000 mL | Freq: Two times a day (BID) | INTRAVENOUS | Status: DC
  Administered 2022-10-29 – 2022-11-01 (×7): 3 mL via INTRAVENOUS

## 2022-10-29 MED ORDER — FENTANYL CITRATE (PF) 100 MCG/2ML IJ SOLN
INTRAMUSCULAR | Status: AC
  Filled 2022-10-29: qty 2

## 2022-10-29 MED ORDER — SODIUM CHLORIDE 0.9 % IV SOLN: INTRAVENOUS | Status: AC

## 2022-10-29 MED ORDER — HEPARIN SODIUM (PORCINE) 5000 UNIT/ML IJ SOLN
5000.0000 [IU] | Freq: Three times a day (TID) | INTRAMUSCULAR | Status: AC
  Administered 2022-10-29 – 2022-10-31 (×7): 5000 [IU] via SUBCUTANEOUS
  Filled 2022-10-29 (×7): qty 1

## 2022-10-29 MED ORDER — LABETALOL HCL 5 MG/ML IV SOLN: 10.0000 mg | INTRAVENOUS | Status: AC | PRN

## 2022-10-29 MED ORDER — SODIUM CHLORIDE 0.9 % IV SOLN: INTRAVENOUS | Status: DC

## 2022-10-29 MED ORDER — LIDOCAINE HCL (PF) 1 % IJ SOLN
INTRAMUSCULAR | Status: AC
  Filled 2022-10-29: qty 30

## 2022-10-29 SURGICAL SUPPLY — 22 items
CATH EXPO 5F MPA-1 (CATHETERS) IMPLANT
CATH INFINITI 5 FR IM (CATHETERS) IMPLANT
CATH INFINITI 5 FR RCB (CATHETERS) IMPLANT
CATH INFINITI 5FR MULTPACK ANG (CATHETERS) IMPLANT
CATH SWAN GANZ 7F STRAIGHT (CATHETERS) IMPLANT
ELECT DEFIB PAD ADLT CADENCE (PAD) IMPLANT
GLIDESHEATH SLEND SS 6F .021 (SHEATH) IMPLANT
GUIDEWIRE INQWIRE 1.5J.035X260 (WIRE) IMPLANT
GUIDEWIRE NITREX 0.018X80X5 (WIRE) IMPLANT
INQWIRE 1.5J .035X260CM (WIRE) ×1
KIT HEART LEFT (KITS) ×1 IMPLANT
KIT MICROPUNCTURE NIT STIFF (SHEATH) IMPLANT
MAT PREVALON FULL STRYKER (MISCELLANEOUS) IMPLANT
PACK CARDIAC CATHETERIZATION (CUSTOM PROCEDURE TRAY) ×1 IMPLANT
SHEATH PINNACLE 5F 10CM (SHEATH) IMPLANT
SHEATH PINNACLE 7F 10CM (SHEATH) IMPLANT
SYR MEDRAD MARK 7 150ML (SYRINGE) ×1 IMPLANT
TRANSDUCER W/STOPCOCK (MISCELLANEOUS) ×1 IMPLANT
TUBING CIL FLEX 10 FLL-RA (TUBING) ×1 IMPLANT
WIRE EMERALD 3MM-J .035X150CM (WIRE) IMPLANT
WIRE EMERALD 3MM-J .035X260CM (WIRE) IMPLANT
WIRE EMERALD ST .035X260CM (WIRE) IMPLANT

## 2022-10-29 NOTE — Interval H&P Note (Signed)
History and Physical Interval Note:  10/29/2022 11:29 AM  [name]  has presented today for surgery, with the diagnosis of cardiac arrest.  The various methods of treatment have been discussed with the patient and family. After consideration of risks, benefits and other options for treatment, the patient has consented to  Procedure(s): RIGHT/LEFT HEART CATH AND CORONARY/GRAFT ANGIOGRAPHY (N/A) as a surgical intervention.  The patient's history has been reviewed, patient examined, no change in status, stable for surgery.  I have reviewed the patient's chart and labs.  Questions were answered to the patient's satisfaction.     Tonny Bollman

## 2022-10-29 NOTE — H&P (View-Only) (Signed)
 Rounding Note    Patient Name: [name] Date of Encounter: 10/29/2022   [facility] Cardiologist: Philip Nahser, MD   Subjective   Denies any chest pain or dyspnea  Inpatient Medications    Scheduled Meds:  aspirin  325 mg Per Tube Once   Followed by   aspirin  81 mg Per Tube Daily   Chlorhexidine Gluconate Cloth  6 each Topical Daily   cholecalciferol  2,000 Units Oral Daily   insulin aspart  0-9 Units Subcutaneous Q4H   oxidized cellulose  1 each Topical STAT   pantoprazole  40 mg Oral Daily   rosuvastatin  10 mg Oral Daily   sodium chloride flush  3 mL Intravenous Q12H   Continuous Infusions:  sodium chloride Stopped (10/28/22 2252)   sodium chloride     sodium chloride 10 mL/hr at 10/29/22 0655   heparin 1,200 Units/hr (10/29/22 0655)   PRN Meds: sodium chloride, sodium chloride, acetaminophen, alum & mag hydroxide-simeth, docusate sodium, guaiFENesin-dextromethorphan, hydrALAZINE, HYDROmorphone (DILAUDID) injection, labetalol, metoprolol tartrate, ondansetron, mouth rinse, phenol, polyethylene glycol, sodium chloride flush   Vital Signs    Vitals:   10/29/22 0400 10/29/22 0411 10/29/22 0500 10/29/22 0600  BP: 105/61  123/63 122/67  Pulse: 70  75 77  Resp: 19  10 15  Temp: 98.4 F (36.9 C)     TempSrc: Oral     SpO2: 97%  98% 98%  Weight:  101.6 kg    Height:        Intake/Output Summary (Last 24 hours) at 10/29/2022 0803 Last data filed at 10/29/2022 0655 Gross per 24 hour  Intake 885.59 ml  Output 1955 ml  Net -1069.41 ml      10/29/2022    4:11 AM 10/28/2022    7:28 AM 10/22/2022   10:57 AM  Last 3 Weights  Weight (lbs) 223 lb 15.8 oz 215 lb 219 lb  Weight (kg) 101.6 kg 97.523 kg 99.338 kg      Telemetry    NSR, occasional PVCs - Personally Reviewed  ECG    No new EKG - Personally Reviewed  Physical Exam   GEN: No acute distress.   Neck: No JVD Cardiac: RRR, no murmurs, rubs, or gallops.  Respiratory: Clear to  auscultation bilaterally. GI: Soft, nontender, non-distended  MS: No edema; No deformity. Neuro:  Nonfocal  Psych: Normal affect   Labs    High Sensitivity Troponin:   Recent Labs  Lab 10/28/22 1150 10/28/22 1642 10/28/22 2347 10/29/22 0410  TROPONINIHS 73* 261* 294* 222*     Chemistry Recent Labs  Lab 10/22/22 1200 10/28/22 1020 10/28/22 1150 10/28/22 1152 10/28/22 1437 10/29/22 0410  NA 138   < > 134* 136 137 138  K 4.4   < > 4.6 4.7 4.6 3.9  CL 105  --  105  --   --  108  CO2 26  --  19*  --   --  22  GLUCOSE 150*  --  228*  --   --  128*  BUN 15  --  17  --   --  11  CREATININE 1.07  --  1.05  --   --  0.85  CALCIUM 9.2  --  8.7*  --   --  8.8*  MG  --   --  1.7  --   --  1.9  PROT 7.1  --   --   --   --  6.2*  ALBUMIN 4.0  --   --   --   --    3.3*  AST 21  --   --   --   --  23  ALT 25  --   --   --   --  33  ALKPHOS 47  --   --   --   --  39  BILITOT 0.9  --   --   --   --  1.2  GFRNONAA >60  --  >60  --   --  >60  ANIONGAP 7  --  10  --   --  8   < > = values in this interval not displayed.    Lipids No results for input(s): "CHOL", "TRIG", "HDL", "LABVLDL", "LDLCALC", "CHOLHDL" in the last 168 hours.  Hematology Recent Labs  Lab 10/22/22 1200 10/28/22 1020 10/28/22 1150 10/28/22 1152 10/28/22 1437 10/29/22 0410  WBC 7.5  --  10.0  --   --  9.8  RBC 4.99  --  4.41  --   --  4.33  HGB 16.2   < > 14.5 14.3 14.6 14.4  HCT 48.5   < > 42.7 42.0 43.0 41.4  MCV 97.2  --  96.8  --   --  95.6  MCH 32.5  --  32.9  --   --  33.3  MCHC 33.4  --  34.0  --   --  34.8  RDW 12.2  --  12.3  --   --  12.5  PLT 183  --  189  --   --  133*   < > = values in this interval not displayed.   Thyroid No results for input(s): "TSH", "FREET4" in the last 168 hours.  BNPNo results for input(s): "BNP", "PROBNP" in the last 168 hours.  DDimer No results for input(s): "DDIMER" in the last 168 hours.   Radiology    DG Chest Port 1 View  Result Date: 10/28/2022 CLINICAL  DATA:  Cardiac arrest EXAM: PORTABLE CHEST 1 VIEW COMPARISON:  CXR 02/19/04 FINDINGS: Right-sided central venous catheter with tip in the upper SVC. Status post median sternotomy. No pleural effusion. No pneumothorax. No focal airspace opacity. Normal cardiac and mediastinal contours. No radiographically apparent displaced rib fractures. Visualized upper abdomen is unremarkable. Vertebral body heights are maintained. IMPRESSION: Right-sided central venous catheter with tip in the upper SVC. No pneumothorax Electronically Signed   By: Hemant  Desai M.D.   On: 10/28/2022 13:21   ECHOCARDIOGRAM COMPLETE  Result Date: 10/28/2022    ECHOCARDIOGRAM REPORT   Patient Name:   [name] Date of Exam: 10/28/2022 Medical Rec #:  [id number]      Height:       72.0 in Accession #:    [id number]     Weight:       215.0 lb Date of Birth:  12/21/1955       BSA:          2.197 m Patient Age:    67 years       BP:           104/52 mmHg Patient Gender: M              HR:           73 bpm. Exam Location:  Inpatient Procedure: 2D Echo, Color Doppler, Cardiac Doppler and Intracardiac            Opacification Agent Indications:    Cardiac arrest I46.9  History:        Patient has no prior   history of Echocardiogram examinations.                 CHF, CAD, Prior CABG, TIA and Carotid Disease, Aortic Valve                 Disease; Risk Factors:Former Smoker, Hypertension, Dyslipidemia,                 Sleep Apnea and Diabetes.  Sonographer:    Norma Walker Referring Phys: [id number] DANIEL C SMITH  Sonographer Comments: Echo performed with patient supine and on artificial respirator and Technically difficult study due to poor echo windows. Image acquisition challenging due to patient body habitus and Image acquisition challenging due to respiratory  motion. IMPRESSIONS  1. Left ventricular ejection fraction, by estimation, is <20%. The left ventricle has severely decreased function. The left ventricle demonstrates global hypokinesis. The  left ventricular internal cavity size was severely dilated. Left ventricular diastolic parameters are consistent with Grade II diastolic dysfunction (pseudonormalization). Elevated left ventricular end-diastolic pressure.  2. Right ventricular systolic function is moderately reduced. The right ventricular size is normal. There is normal pulmonary artery systolic pressure. The estimated right ventricular systolic pressure is 22.2 mmHg.  3. Left atrial size was mildly dilated.  4. The mitral valve is normal in structure. Trivial mitral valve regurgitation. No evidence of mitral stenosis.  5. The aortic valve was not well visualized. Aortic valve regurgitation is not visualized. Aortic valve sclerosis/calcification is present, without any evidence of aortic stenosis.  6. The inferior vena cava is dilated in size with <50% respiratory variability, suggesting right atrial pressure of 15 mmHg. FINDINGS  Left Ventricle: Left ventricular ejection fraction, by estimation, is <20%. The left ventricle has severely decreased function. The left ventricle demonstrates global hypokinesis. Definity contrast agent was given IV to delineate the left ventricular endocardial borders. The left ventricular internal cavity size was severely dilated. There is no left ventricular hypertrophy. Left ventricular diastolic parameters are consistent with Grade II diastolic dysfunction (pseudonormalization). Elevated left ventricular end-diastolic pressure. Right Ventricle: The right ventricular size is normal. No increase in right ventricular wall thickness. Right ventricular systolic function is moderately reduced. There is normal pulmonary artery systolic pressure. The tricuspid regurgitant velocity is 1.34 m/s, and with an assumed right atrial pressure of 15 mmHg, the estimated right ventricular systolic pressure is 22.2 mmHg. Left Atrium: Left atrial size was mildly dilated. Right Atrium: Right atrial size was normal in size. Pericardium:  There is no evidence of pericardial effusion. Mitral Valve: The mitral valve is normal in structure. Trivial mitral valve regurgitation. No evidence of mitral valve stenosis. Tricuspid Valve: The tricuspid valve is normal in structure. Tricuspid valve regurgitation is trivial. No evidence of tricuspid stenosis. Aortic Valve: The aortic valve was not well visualized. Aortic valve regurgitation is not visualized. Aortic valve sclerosis/calcification is present, without any evidence of aortic stenosis. Pulmonic Valve: The pulmonic valve was normal in structure. Pulmonic valve regurgitation is not visualized. No evidence of pulmonic stenosis. Aorta: The aortic root is normal in size and structure. Venous: The inferior vena cava is dilated in size with less than 50% respiratory variability, suggesting right atrial pressure of 15 mmHg. IAS/Shunts: No atrial level shunt detected by color flow Doppler.  LEFT VENTRICLE PLAX 2D LVIDd:         6.20 cm   Diastology LVIDs:         5.80 cm   LV e' medial:    3.81 cm/s LV PW:           1.00 cm   LV E/e' medial:  19.2 LV IVS:        0.80 cm   LV e' lateral:   5.44 cm/s LVOT diam:     2.20 cm   LV E/e' lateral: 13.5 LV SV:         39 LV SV Index:   18 LVOT Area:     3.80 cm  RIGHT VENTRICLE RV S prime:     9.46 cm/s TAPSE (M-mode): 1.2 cm LEFT ATRIUM             Index        RIGHT ATRIUM           Index LA diam:        5.00 cm 2.28 cm/m   RA Area:     22.20 cm LA Vol (A2C):   80.6 ml 36.69 ml/m  RA Volume:   64.40 ml  29.31 ml/m LA Vol (A4C):   69.4 ml 31.59 ml/m LA Biplane Vol: 77.0 ml 35.05 ml/m  AORTIC VALVE LVOT Vmax:   53.80 cm/s LVOT Vmean:  38.200 cm/s LVOT VTI:    0.103 m  AORTA Ao Root diam: 3.40 cm MITRAL VALVE               TRICUSPID VALVE MV Area (PHT): 4.80 cm    TR Peak grad:   7.2 mmHg MV Decel Time: 158 msec    TR Vmax:        134.00 cm/s MV E velocity: 73.30 cm/s MV A velocity: 82.90 cm/s  SHUNTS MV E/A ratio:  0.88        Systemic VTI:  0.10 m                             Systemic Diam: 2.20 cm Traci Turner MD Electronically signed by Traci Turner MD Signature Date/Time: 10/28/2022/1:05:01 PM    Final    PERIPHERAL VASCULAR CATHETERIZATION  Result Date: 10/28/2022 See surgical note for result.  HYBRID OR IMAGING ([facility] ONLY)  Result Date: 10/28/2022 There is no interpretation for this exam.  This order is for images obtained during a surgical procedure.  Please See "Surgeries" Tab for more information regarding the procedure.    Cardiac Studies     Patient Profile     67 y.o. male with a history of CAD s/p CABG x4 in 01/2002, chronic HFrEF with EF of 30-35%, carotid artery disease s/p right CEA and patch angioplasty in 11/2016, PAD s/p right iliofemoral artery endarterectomy in May and patch angioplasty as well as stenting of bilateral common iliac arteries in 04/2021, hypertension, hyperlipidemia, type 2 diabetes mellitus, and TIA who is being seen  for the evaluation of VT arrest   Assessment & Plan    Cardiac arrest: Presented for planned right transcarotid artery revascularization on 4/25, had cardiac arrest shortly after procedure started.  Initial rhythm was pulseless VT.  Chest compressions were started and patient was shocked x 1 with quick return of ROSC.  Subsequently had second episode of VT but ROSC was obtained after brief ACLS.  Echo showed EF less than 20%, severe LV dilatation, grade 2 diastolic dysfunction, moderately reduced RV function, no significant valvular disease -Plan RHC/LHC today.  Risks and benefits of cardiac catheterization have been discussed with the patient.  These include bleeding, infection, kidney damage, stroke, heart attack, death.  The patient understands these risks and is willing to proceed. -Cardiac MRI -Suspect   VT due to his ischemic cardiomyopathy and will need ICD evaluation.  Discussed with EP: will follow-up result of cath and MRI  Acute on chronic combined heart failure: Last echo at [facility] was in 2015 showed  EF 30 to 35%.  [facility] and was being evaluated for ICD but reports has been told EF had improved and ICD was deferred.  Echo this admission shows EF less than 20% -Off home heart failure medications(coreg 25 mg BID, entresto 97/103 BID, isordil 10mg TID, aldactone 25 mg, jardiance) due to hypotension yesterday requiring pressor support.  BP stable today off pressors, will slowly add back heart failure meds to ensure BP tolerating.  Start coreg 3.125 mg BID, jardiance 10 mg daily, and spironolactone 12.5 mg daily today.  Hold off on entresto for now until after cath, would restart at lower dose -Appears euvolemic -Check RHC/LHC as above -Cardiac MRI as above  CAD: status post CABG in 2003.  Last ischemic evaluation was Myoview in 2018 which showed large inferolateral infarct but no ischemia.  He denies any recent chest pain.  Troponin peaked 294, likely ddue to cardiac arrest -Plan LHC today -Continue aspirin, Crestor  Hypertension: Hypotensive after cardiac arrest 4/25, currently normotensive despite no medications.  Will monitor BP while adding back GDMT as above  For questions or updates, please contact [facility] Please consult [email or web address] for contact info under        Signed, Raiza Kiesel L Harim Bi, MD  10/29/2022, 8:03 AM    

## 2022-10-29 NOTE — Progress Notes (Signed)
Site area- right  Site Prior to Removal- 0   Pressure Applied For-  20 MInutes   Bedrest Beginning at - 1405   Manual- Yes   Patient Status During Pull- Stable    Post Pull Groin Site- 0   Post Pull Instructions Given- Yes   Post Pull Pulses Present- Yes    Dressing Applied- Tegaderm and Gauze Dressing    Comments:  Pulled both arterial and venous sheaths. Hemostasis for arterial at  1350 and venous at 1405

## 2022-10-29 NOTE — Progress Notes (Addendum)
  Progress Note    10/29/2022 7:52 AM 1 Day Post-Op  Subjective:  sitting up in chair.  Chest wall pain   Vitals:   10/29/22 0500 10/29/22 0600  BP: 123/63 122/67  Pulse: 75 77  Resp: 10 15  Temp:    SpO2: 98% 98%   Physical Exam: Lungs:  non labored Extremities:  moving all ext well Abdomen:  soft, NT, ND Neurologic: A&O  CBC    Component Value Date/Time   WBC 9.8 10/29/2022 0410   RBC 4.33 10/29/2022 0410   HGB 14.4 10/29/2022 0410   HCT 41.4 10/29/2022 0410   PLT 133 (L) 10/29/2022 0410   MCV 95.6 10/29/2022 0410   MCH 33.3 10/29/2022 0410   MCHC 34.8 10/29/2022 0410   RDW 12.5 10/29/2022 0410   LYMPHSABS 1.5 10/28/2022 1150   MONOABS 0.5 10/28/2022 1150   EOSABS 0.2 10/28/2022 1150   BASOSABS 0.0 10/28/2022 1150    BMET    Component Value Date/Time   NA 138 10/29/2022 0410   K 3.9 10/29/2022 0410   CL 108 10/29/2022 0410   CO2 22 10/29/2022 0410   GLUCOSE 128 (H) 10/29/2022 0410   BUN 11 10/29/2022 0410   CREATININE 0.85 10/29/2022 0410   CALCIUM 8.8 (L) 10/29/2022 0410   GFRNONAA >60 10/29/2022 0410   GFRAA >60 05/22/2018 1049    INR    Component Value Date/Time   INR 1.2 10/28/2022 1150     Intake/Output Summary (Last 24 hours) at 10/29/2022 0752 Last data filed at 10/29/2022 6045 Gross per 24 hour  Intake 885.59 ml  Output 1955 ml  Net -1069.41 ml     Assessment/Plan:  67 y.o. male is s/p aborted TCAR due to cardiac arrest 1 Day Post-Op   Neuro exam at baseline; conversational sitting up in the chair this morning Tylenol added for chest wall pain related to compressions Plans noted for heart cath today Dr. Lenell Antu will consider revascularization of R ICA when stable from a cardiac standpoint   Emilie Rutter, PA-C [facility] [phone] 10/29/2022 7:52 AM  VASCULAR STAFF ADDENDUM: I have independently interviewed and examined the patient. I agree with the above.   Rande Brunt. Lenell Antu, MD [facility] Phone Number: [phone] 10/29/2022 11:32 AM

## 2022-10-29 NOTE — Progress Notes (Addendum)
Rounding Note    Patient Name: [name] Date of Encounter: 10/29/2022  [facility] Cardiologist: Kristeen Miss, MD   Subjective   Denies any chest pain or dyspnea  Inpatient Medications    Scheduled Meds:  aspirin  325 mg Per Tube Once   Followed by   aspirin  81 mg Per Tube Daily   Chlorhexidine Gluconate Cloth  6 each Topical Daily   cholecalciferol  2,000 Units Oral Daily   insulin aspart  0-9 Units Subcutaneous Q4H   oxidized cellulose  1 each Topical STAT   pantoprazole  40 mg Oral Daily   rosuvastatin  10 mg Oral Daily   sodium chloride flush  3 mL Intravenous Q12H   Continuous Infusions:  sodium chloride Stopped (10/28/22 2252)   sodium chloride     sodium chloride 10 mL/hr at 10/29/22 0655   heparin 1,200 Units/hr (10/29/22 0655)   PRN Meds: sodium chloride, sodium chloride, acetaminophen, alum & mag hydroxide-simeth, docusate sodium, guaiFENesin-dextromethorphan, hydrALAZINE, HYDROmorphone (DILAUDID) injection, labetalol, metoprolol tartrate, ondansetron, mouth rinse, phenol, polyethylene glycol, sodium chloride flush   Vital Signs    Vitals:   10/29/22 0400 10/29/22 0411 10/29/22 0500 10/29/22 0600  BP: 105/61  123/63 122/67  Pulse: 70  75 77  Resp: 19  10 15   Temp: 98.4 F (36.9 C)     TempSrc: Oral     SpO2: 97%  98% 98%  Weight:  101.6 kg    Height:        Intake/Output Summary (Last 24 hours) at 10/29/2022 0803 Last data filed at 10/29/2022 0655 Gross per 24 hour  Intake 885.59 ml  Output 1955 ml  Net -1069.41 ml      10/29/2022    4:11 AM 10/28/2022    7:28 AM 10/22/2022   10:57 AM  Last 3 Weights  Weight (lbs) 223 lb 15.8 oz 215 lb 219 lb  Weight (kg) 101.6 kg 97.523 kg 99.338 kg      Telemetry    NSR, occasional PVCs - Personally Reviewed  ECG    No new EKG - Personally Reviewed  Physical Exam   GEN: No acute distress.   Neck: No JVD Cardiac: RRR, no murmurs, rubs, or gallops.  Respiratory: Clear to  auscultation bilaterally. GI: Soft, nontender, non-distended  MS: No edema; No deformity. Neuro:  Nonfocal  Psych: Normal affect   Labs    High Sensitivity Troponin:   Recent Labs  Lab 10/28/22 1150 10/28/22 1642 10/28/22 2347 10/29/22 0410  TROPONINIHS 73* 261* 294* 222*     Chemistry Recent Labs  Lab 10/22/22 1200 10/28/22 1020 10/28/22 1150 10/28/22 1152 10/28/22 1437 10/29/22 0410  NA 138   < > 134* 136 137 138  K 4.4   < > 4.6 4.7 4.6 3.9  CL 105  --  105  --   --  108  CO2 26  --  19*  --   --  22  GLUCOSE 150*  --  228*  --   --  128*  BUN 15  --  17  --   --  11  CREATININE 1.07  --  1.05  --   --  0.85  CALCIUM 9.2  --  8.7*  --   --  8.8*  MG  --   --  1.7  --   --  1.9  PROT 7.1  --   --   --   --  6.2*  ALBUMIN 4.0  --   --   --   --  3.3*  AST 21  --   --   --   --  23  ALT 25  --   --   --   --  33  ALKPHOS 47  --   --   --   --  39  BILITOT 0.9  --   --   --   --  1.2  GFRNONAA >60  --  >60  --   --  >60  ANIONGAP 7  --  10  --   --  8   < > = values in this interval not displayed.    Lipids No results for input(s): "CHOL", "TRIG", "HDL", "LABVLDL", "LDLCALC", "CHOLHDL" in the last 168 hours.  Hematology Recent Labs  Lab 10/22/22 1200 10/28/22 1020 10/28/22 1150 10/28/22 1152 10/28/22 1437 10/29/22 0410  WBC 7.5  --  10.0  --   --  9.8  RBC 4.99  --  4.41  --   --  4.33  HGB 16.2   < > 14.5 14.3 14.6 14.4  HCT 48.5   < > 42.7 42.0 43.0 41.4  MCV 97.2  --  96.8  --   --  95.6  MCH 32.5  --  32.9  --   --  33.3  MCHC 33.4  --  34.0  --   --  34.8  RDW 12.2  --  12.3  --   --  12.5  PLT 183  --  189  --   --  133*   < > = values in this interval not displayed.   Thyroid No results for input(s): "TSH", "FREET4" in the last 168 hours.  BNPNo results for input(s): "BNP", "PROBNP" in the last 168 hours.  DDimer No results for input(s): "DDIMER" in the last 168 hours.   Radiology    DG Chest Port 1 View  Result Date: 10/28/2022 CLINICAL  DATA:  Cardiac arrest EXAM: PORTABLE CHEST 1 VIEW COMPARISON:  CXR 02/19/04 FINDINGS: Right-sided central venous catheter with tip in the upper SVC. Status post median sternotomy. No pleural effusion. No pneumothorax. No focal airspace opacity. Normal cardiac and mediastinal contours. No radiographically apparent displaced rib fractures. Visualized upper abdomen is unremarkable. Vertebral body heights are maintained. IMPRESSION: Right-sided central venous catheter with tip in the upper SVC. No pneumothorax Electronically Signed   By: Lorenza Cambridge M.D.   On: 10/28/2022 13:21   ECHOCARDIOGRAM COMPLETE  Result Date: 10/28/2022    ECHOCARDIOGRAM REPORT   Patient Name:   [name] Date of Exam: 10/28/2022 Medical Rec #:  [id number]      Height:       72.0 in Accession #:    [id number]     Weight:       215.0 lb Date of Birth:  1955-07-17       BSA:          2.197 m Patient Age:    67 years       BP:           104/52 mmHg Patient Gender: M              HR:           73 bpm. Exam Location:  Inpatient Procedure: 2D Echo, Color Doppler, Cardiac Doppler and Intracardiac            Opacification Agent Indications:    Cardiac arrest I46.9  History:        Patient has no prior  history of Echocardiogram examinations.                 CHF, CAD, Prior CABG, TIA and Carotid Disease, Aortic Valve                 Disease; Risk Factors:Former Smoker, Hypertension, Dyslipidemia,                 Sleep Apnea and Diabetes.  Sonographer:    Aron Baba Referring Phys: [id number] Lorin Glass  Sonographer Comments: Echo performed with patient supine and on artificial respirator and Technically difficult study due to poor echo windows. Image acquisition challenging due to patient body habitus and Image acquisition challenging due to respiratory  motion. IMPRESSIONS  1. Left ventricular ejection fraction, by estimation, is <20%. The left ventricle has severely decreased function. The left ventricle demonstrates global hypokinesis. The  left ventricular internal cavity size was severely dilated. Left ventricular diastolic parameters are consistent with Grade II diastolic dysfunction (pseudonormalization). Elevated left ventricular end-diastolic pressure.  2. Right ventricular systolic function is moderately reduced. The right ventricular size is normal. There is normal pulmonary artery systolic pressure. The estimated right ventricular systolic pressure is 22.2 mmHg.  3. Left atrial size was mildly dilated.  4. The mitral valve is normal in structure. Trivial mitral valve regurgitation. No evidence of mitral stenosis.  5. The aortic valve was not well visualized. Aortic valve regurgitation is not visualized. Aortic valve sclerosis/calcification is present, without any evidence of aortic stenosis.  6. The inferior vena cava is dilated in size with <50% respiratory variability, suggesting right atrial pressure of 15 mmHg. FINDINGS  Left Ventricle: Left ventricular ejection fraction, by estimation, is <20%. The left ventricle has severely decreased function. The left ventricle demonstrates global hypokinesis. Definity contrast agent was given IV to delineate the left ventricular endocardial borders. The left ventricular internal cavity size was severely dilated. There is no left ventricular hypertrophy. Left ventricular diastolic parameters are consistent with Grade II diastolic dysfunction (pseudonormalization). Elevated left ventricular end-diastolic pressure. Right Ventricle: The right ventricular size is normal. No increase in right ventricular wall thickness. Right ventricular systolic function is moderately reduced. There is normal pulmonary artery systolic pressure. The tricuspid regurgitant velocity is 1.34 m/s, and with an assumed right atrial pressure of 15 mmHg, the estimated right ventricular systolic pressure is 22.2 mmHg. Left Atrium: Left atrial size was mildly dilated. Right Atrium: Right atrial size was normal in size. Pericardium:  There is no evidence of pericardial effusion. Mitral Valve: The mitral valve is normal in structure. Trivial mitral valve regurgitation. No evidence of mitral valve stenosis. Tricuspid Valve: The tricuspid valve is normal in structure. Tricuspid valve regurgitation is trivial. No evidence of tricuspid stenosis. Aortic Valve: The aortic valve was not well visualized. Aortic valve regurgitation is not visualized. Aortic valve sclerosis/calcification is present, without any evidence of aortic stenosis. Pulmonic Valve: The pulmonic valve was normal in structure. Pulmonic valve regurgitation is not visualized. No evidence of pulmonic stenosis. Aorta: The aortic root is normal in size and structure. Venous: The inferior vena cava is dilated in size with less than 50% respiratory variability, suggesting right atrial pressure of 15 mmHg. IAS/Shunts: No atrial level shunt detected by color flow Doppler.  LEFT VENTRICLE PLAX 2D LVIDd:         6.20 cm   Diastology LVIDs:         5.80 cm   LV e' medial:    3.81 cm/s LV PW:  1.00 cm   LV E/e' medial:  19.2 LV IVS:        0.80 cm   LV e' lateral:   5.44 cm/s LVOT diam:     2.20 cm   LV E/e' lateral: 13.5 LV SV:         39 LV SV Index:   18 LVOT Area:     3.80 cm  RIGHT VENTRICLE RV S prime:     9.46 cm/s TAPSE (M-mode): 1.2 cm LEFT ATRIUM             Index        RIGHT ATRIUM           Index LA diam:        5.00 cm 2.28 cm/m   RA Area:     22.20 cm LA Vol (A2C):   80.6 ml 36.69 ml/m  RA Volume:   64.40 ml  29.31 ml/m LA Vol (A4C):   69.4 ml 31.59 ml/m LA Biplane Vol: 77.0 ml 35.05 ml/m  AORTIC VALVE LVOT Vmax:   53.80 cm/s LVOT Vmean:  38.200 cm/s LVOT VTI:    0.103 m  AORTA Ao Root diam: 3.40 cm MITRAL VALVE               TRICUSPID VALVE MV Area (PHT): 4.80 cm    TR Peak grad:   7.2 mmHg MV Decel Time: 158 msec    TR Vmax:        134.00 cm/s MV E velocity: 73.30 cm/s MV A velocity: 82.90 cm/s  SHUNTS MV E/A ratio:  0.88        Systemic VTI:  0.10 m                             Systemic Diam: 2.20 cm Armanda Magic MD Electronically signed by Armanda Magic MD Signature Date/Time: 10/28/2022/1:05:01 PM    Final    PERIPHERAL VASCULAR CATHETERIZATION  Result Date: 10/28/2022 See surgical note for result.  HYBRID OR IMAGING ([facility] ONLY)  Result Date: 10/28/2022 There is no interpretation for this exam.  This order is for images obtained during a surgical procedure.  Please See "Surgeries" Tab for more information regarding the procedure.    Cardiac Studies     Patient Profile     67 y.o. male with a history of CAD s/p CABG x4 in 01/2002, chronic HFrEF with EF of 30-35%, carotid artery disease s/p right CEA and patch angioplasty in 11/2016, PAD s/p right iliofemoral artery endarterectomy in May and patch angioplasty as well as stenting of bilateral common iliac arteries in 04/2021, hypertension, hyperlipidemia, type 2 diabetes mellitus, and TIA who is being seen  for the evaluation of VT arrest   Assessment & Plan    Cardiac arrest: Presented for planned right transcarotid artery revascularization on 4/25, had cardiac arrest shortly after procedure started.  Initial rhythm was pulseless VT.  Chest compressions were started and patient was shocked x 1 with quick return of ROSC.  Subsequently had second episode of VT but ROSC was obtained after brief ACLS.  Echo showed EF less than 20%, severe LV dilatation, grade 2 diastolic dysfunction, moderately reduced RV function, no significant valvular disease -Plan RHC/LHC today.  Risks and benefits of cardiac catheterization have been discussed with the patient.  These include bleeding, infection, kidney damage, stroke, heart attack, death.  The patient understands these risks and is willing to proceed. -Cardiac MRI -Suspect  VT due to his ischemic cardiomyopathy and will need ICD evaluation.  Discussed with EP: will follow-up result of cath and MRI  Acute on chronic combined heart failure: Last echo at [facility] was in 2015 showed  EF 30 to 35%.  States that he follows at the [facility] for cardiology now and was being evaluated for ICD but reports has been told EF had improved and ICD was deferred.  Echo this admission shows EF less than 20% -Off home heart failure medications(coreg 25 mg BID, entresto 97/103 BID, isordil 10mg  TID, aldactone 25 mg, jardiance) due to hypotension yesterday requiring pressor support.  BP stable today off pressors, will slowly add back heart failure meds to ensure BP tolerating.  Start coreg 3.125 mg BID, jardiance 10 mg daily, and spironolactone 12.5 mg daily today.  Hold off on entresto for now until after cath, would restart at lower dose -Appears euvolemic -Check RHC/LHC as above -Cardiac MRI as above  CAD: status post CABG in 2003.  Last ischemic evaluation was Myoview in 2018 which showed large inferolateral infarct but no ischemia.  He denies any recent chest pain.  Troponin peaked 294, likely ddue to cardiac arrest -Plan LHC today -Continue aspirin, Crestor  Hypertension: Hypotensive after cardiac arrest 4/25, currently normotensive despite no medications.  Will monitor BP while adding back GDMT as above  For questions or updates, please contact  [facility] Please consult [email or web address] for contact info under        Signed, Little Ishikawa, MD  10/29/2022, 8:03 AM

## 2022-10-29 NOTE — Progress Notes (Signed)
ANTICOAGULATION CONSULT NOTE  Pharmacy Consult for Heparin Indication: ACS/STEMI  No Known Allergies  Patient Measurements: Height: 6' (182.9 cm) Weight: 101.6 kg (223 lb 15.8 oz) IBW/kg (Calculated) : 77.6 Heparin Dosing Weight: actual weight  Vital Signs: Temp: 98.4 F (36.9 C) (04/26 0400) Temp Source: Oral (04/26 0400) BP: 122/67 (04/26 0600) Pulse Rate: 77 (04/26 0600)  Labs: Recent Labs    10/28/22 1150 10/28/22 1152 10/28/22 1437 10/28/22 1642 10/28/22 2140 10/28/22 2347 10/29/22 0410  HGB 14.5 14.3 14.6  --   --   --  14.4  HCT 42.7 42.0 43.0  --   --   --  41.4  PLT 189  --   --   --   --   --  133*  APTT 34  --   --   --   --   --   --   LABPROT 15.3*  --   --   --   --   --   --   INR 1.2  --   --   --   --   --   --   HEPARINUNFRC  --   --   --   --  0.36  --  0.28*  CREATININE 1.05  --   --   --   --   --  0.85  TROPONINIHS 73*  --   --  261*  --  294* 222*     Estimated Creatinine Clearance: 102.6 mL/min (by C-G formula based on SCr of 0.85 mg/dL).   Assessment: 67 yo male who presented for TCAR, but procedure was cancelled due to pulseless VT requiring shock and brief compressions, recurrent arrest with ROSC. Pharmacy is consulted to dose IV heparin for r/o ACS. No PTA anticoagulants, baseline aPTT, PT/INR, CBC wnl.  Heparin level 0.28 (subtherapeutic) on infusion at 1200 units/hr. H/H stable, Plts slightly decreased 133. No bleeding noted.  Goal of Therapy:  Heparin level 0.3-0.7 units/ml Monitor platelets by anticoagulation protocol: Yes   Plan:  Increase heparin at 1400 units/hr Recheck heparin level in 6hr or f/u after cath (whichever comes first)  Loralee Pacas, PharmD, BCPS Please see amion for complete clinical pharmacist phone list 10/29/2022,9:00 AM

## 2022-10-29 NOTE — Progress Notes (Addendum)
NAME:  [name], MRN:  [id number], DOB:  1956/04/23, LOS: 1 ADMISSION DATE:  10/28/2022, CONSULTATION DATE:  10/28/22 REFERRING MD:  Lenell Antu, CHIEF COMPLAINT:  cardiac arrest   History of Present Illness:  67 year old man with extensive vascular history detailed in Dr. Verita Lamb note, DM, ischemic cardiomyopathy w/ prior CABG, DM, HTN who presented today for treatment of his symptomatic carotid artery stenosis w/ TCAR.  During femoral vein wiring went into pulseless VT requiring shock and brief compressions.  Subsequently brady'd and had recurrent arrest again with quick ROSC, started on epi drip and sent to ICU for further management.  Procedure cancelled due to hemodynamic instability.  On arrival to ICU patient remains intubated on levophed. Fentanyl started, central line placed. Later in afternoon patient alert, following commands, passed SBT. Extubated to 2L [place].   Pertinent  Medical History  ischemic cardiomyopathy w/ prior CABG, DM, HTN   Significant Hospital Events: Including procedures, antibiotic start and stop dates in addition to other pertinent events   10/28/22 admit  Interim History / Subjective:  This AM remains off vasopressors, plans for heart cath and cardiac MRI today   Objective   Blood pressure 122/67, pulse 77, temperature 98.5 F (36.9 C), temperature source Oral, resp. rate 15, height 6' (1.829 m), weight 101.6 kg, SpO2 98 %. CVP:  [4 mmHg-24 mmHg] 24 mmHg  Vent Mode: PRVC FiO2 (%):  [100 %] 100 % Set Rate:  [24 bmp] 24 bmp Vt Set:  [191 mL] 620 mL PEEP:  [8 cmH20] 8 cmH20 Plateau Pressure:  [24 cmH20] 24 cmH20   Intake/Output Summary (Last 24 hours) at 10/29/2022 1007 Last data filed at 10/29/2022 4782 Gross per 24 hour  Intake 767.76 ml  Output 1955 ml  Net -1187.24 ml   Filed Weights   10/28/22 0728 10/29/22 0411  Weight: 97.5 kg 101.6 kg    Examination: General: Adult male, sitting in chair, no distress  HENT: MMM Lungs: Clear breath sounds   Cardiovascular: RRR, HR 70, no mRG Abdomen: soft, distended, active bowel sounds  Extremities: L femoral access site >> no hematoma  Neuro: alert, oriented, follows commands  Skin: intact    Resolved Hospital Problem list   N/a  Assessment & Plan:   Brief VT arrest  - ECHO with EF 20%, Severe LV dilatation, grade 2 DD, moderately reduced RV function - Prior EF 30-35 2015  Ischemic Cardiomyopathy  CAD, prior CABG 2003 Carotid Artery stenosis, Carotid artery disease s/p right CEA and patch angioplasty in 11/2016  HTN Severe PAD, s/p right iliofemoral artery endarterectomy in May and patch angioplasty as well as stenting of bilateral common iliac arteries in 04/2021  Plan - Cardiac Monitoring - Cardiology Following  - EP consulted - Plans for Cardiac Cath and MRI today  - Cardiology restarted Coreg 3.125 BID and Spironolactone 12.5 mg  - Continue ASA, Crestor  - Continues on heparin gtt  - Home Plavix, Isordil, Entresto remain on hold   DM2 - Hemoglobin AIC 6.5  Plan - Cardiology restarted Jardiance  - Trend Glucose, SSI   GERD Plan - Continue PPI   Best Practice (right click and "Reselect all SmartList Selections" daily)   Diet/type: NPO DVT prophylaxis: systemic heparin GI prophylaxis: N/A Lines: Central line Foley:  Yes, and it is still needed Code Status:  full code Last date of multidisciplinary goals of care discussion [wife and daughters updated at bedside}  Labs   CBC: Recent Labs  Lab 10/22/22 1200  10/28/22 1020 10/28/22 1150 10/28/22 1152 10/28/22 1437 10/29/22 0410  WBC 7.5  --  10.0  --   --  9.8  NEUTROABS  --   --  7.8*  --   --   --   HGB 16.2 15.6 14.5 14.3 14.6 14.4  HCT 48.5 46.0 42.7 42.0 43.0 41.4  MCV 97.2  --  96.8  --   --  95.6  PLT 183  --  189  --   --  133*    Basic Metabolic Panel: Recent Labs  Lab 10/22/22 1200 10/28/22 1020 10/28/22 1150 10/28/22 1152 10/28/22 1437 10/29/22 0410  NA 138 138 134* 136 137 138  K  4.4 4.4 4.6 4.7 4.6 3.9  CL 105  --  105  --   --  108  CO2 26  --  19*  --   --  22  GLUCOSE 150*  --  228*  --   --  128*  BUN 15  --  17  --   --  11  CREATININE 1.07  --  1.05  --   --  0.85  CALCIUM 9.2  --  8.7*  --   --  8.8*  MG  --   --  1.7  --   --  1.9  PHOS  --   --  3.2  --   --  3.2   GFR: Estimated Creatinine Clearance: 102.6 mL/min (by C-G formula based on SCr of 0.85 mg/dL). Recent Labs  Lab 10/22/22 1200 10/28/22 1150 10/29/22 0410  WBC 7.5 10.0 9.8  LATICACIDVEN  --  1.4  --     Liver Function Tests: Recent Labs  Lab 10/22/22 1200 10/29/22 0410  AST 21 23  ALT 25 33  ALKPHOS 47 39  BILITOT 0.9 1.2  PROT 7.1 6.2*  ALBUMIN 4.0 3.3*   No results for input(s): "LIPASE", "AMYLASE" in the last 168 hours. No results for input(s): "AMMONIA" in the last 168 hours.  ABG    Component Value Date/Time   PHART 7.344 (L) 10/28/2022 1437   PCO2ART 34.0 10/28/2022 1437   PO2ART 83 10/28/2022 1437   HCO3 18.7 (L) 10/28/2022 1437   TCO2 20 (L) 10/28/2022 1437   ACIDBASEDEF 6.0 (H) 10/28/2022 1437   O2SAT 96 10/28/2022 1437     Coagulation Profile: Recent Labs  Lab 10/22/22 1200 10/28/22 1150  INR 1.1 1.2    Cardiac Enzymes: No results for input(s): "CKTOTAL", "CKMB", "CKMBINDEX", "TROPONINI" in the last 168 hours.  HbA1C: Hgb A1c MFr Bld  Date/Time Value Ref Range Status  10/28/2022 11:50 AM 6.5 (H) 4.8 - 5.6 % Final    Comment:    (NOTE) Pre diabetes:          5.7%-6.4%  Diabetes:              >6.4%  Glycemic control for   <7.0% adults with diabetes   09/29/2021 03:29 PM 7.2 (H) 4.6 - 6.5 % Final    Comment:    Glycemic Control Guidelines for People with Diabetes:Non Diabetic:  <6%Goal of Therapy: <7%Additional Action Suggested:  >8%     CBG: Recent Labs  Lab 10/28/22 1603 10/28/22 1957 10/28/22 2344 10/29/22 0408 10/29/22 0923  GLUCAP 135* 149* 91 116* 172*    Review of Systems:   Denies Pain, SOB    Time Spent: 45 minutes      Jovita Kussmaul, AGACNP-BC [facility]  PCCM Pgr: [phone]

## 2022-10-30 DIAGNOSIS — I469 Cardiac arrest, cause unspecified: Secondary | ICD-10-CM | POA: Diagnosis not present

## 2022-10-30 LAB — CBC
HCT: 44.8 % (ref 39.0–52.0)
Hemoglobin: 14.7 g/dL (ref 13.0–17.0)
MCH: 32 pg (ref 26.0–34.0)
MCHC: 32.8 g/dL (ref 30.0–36.0)
MCV: 97.6 fL (ref 80.0–100.0)
Platelets: 125 10*3/uL — ABNORMAL LOW (ref 150–400)
RBC: 4.59 MIL/uL (ref 4.22–5.81)
RDW: 12.5 % (ref 11.5–15.5)
WBC: 8.8 10*3/uL (ref 4.0–10.5)
nRBC: 0 % (ref 0.0–0.2)

## 2022-10-30 LAB — COMPREHENSIVE METABOLIC PANEL
ALT: 26 U/L (ref 0–44)
AST: 19 U/L (ref 15–41)
Albumin: 3.4 g/dL — ABNORMAL LOW (ref 3.5–5.0)
Alkaline Phosphatase: 46 U/L (ref 38–126)
Anion gap: 8 (ref 5–15)
BUN: 12 mg/dL (ref 8–23)
CO2: 24 mmol/L (ref 22–32)
Calcium: 8.9 mg/dL (ref 8.9–10.3)
Chloride: 104 mmol/L (ref 98–111)
Creatinine, Ser: 0.97 mg/dL (ref 0.61–1.24)
GFR, Estimated: >60 mL/min (ref >60)
Glucose, Bld: 120 mg/dL — ABNORMAL HIGH (ref 70–99)
Potassium: 3.8 mmol/L (ref 3.5–5.1)
Sodium: 136 mmol/L (ref 135–145)
Total Bilirubin: 1 mg/dL (ref 0.3–1.2)
Total Protein: 6.4 g/dL — ABNORMAL LOW (ref 6.5–8.1)

## 2022-10-30 LAB — GLUCOSE, CAPILLARY: Glucose-Capillary: 94 mg/dL (ref 70–99)

## 2022-10-30 LAB — MAGNESIUM: Magnesium: 1.8 mg/dL (ref 1.7–2.4)

## 2022-10-30 LAB — PHOSPHORUS: Phosphorus: 3.4 mg/dL (ref 2.5–4.6)

## 2022-10-30 MED ORDER — CLOPIDOGREL BISULFATE 75 MG PO TABS: 75.0000 mg | ORAL_TABLET | Freq: Every day | ORAL | Status: DC

## 2022-10-30 MED ORDER — CLOPIDOGREL BISULFATE 75 MG PO TABS
300.0000 mg | ORAL_TABLET | Freq: Once | ORAL | Status: AC
  Administered 2022-10-30: 300 mg via ORAL
  Filled 2022-10-30: qty 4

## 2022-10-30 NOTE — Progress Notes (Signed)
Rounding Note    Patient Name: [name] Date of Encounter: 10/30/2022  [facility] Cardiologist: Kristeen Miss, MD   Subjective  No complaints  Inpatient Medications    Scheduled Meds:  aspirin  325 mg Per Tube Once   Followed by   aspirin  81 mg Per Tube Daily   carvedilol  3.125 mg Oral BID WC   cholecalciferol  2,000 Units Oral Daily   [START ON 10/31/2022] clopidogrel  75 mg Oral Daily   empagliflozin  10 mg Oral Daily   heparin  5,000 Units Subcutaneous Q8H   insulin aspart  0-9 Units Subcutaneous Q4H   pantoprazole  40 mg Oral Daily   rosuvastatin  10 mg Oral Daily   sacubitril-valsartan  1 tablet Oral BID   sodium chloride flush  3 mL Intravenous Q12H   sodium chloride flush  3 mL Intravenous Q12H   spironolactone  12.5 mg Oral Daily   Continuous Infusions:  sodium chloride Stopped (10/28/22 2252)   sodium chloride     PRN Meds: sodium chloride, sodium chloride, acetaminophen, alum & mag hydroxide-simeth, docusate sodium, guaiFENesin-dextromethorphan, hydrALAZINE, HYDROmorphone (DILAUDID) injection, labetalol, metoprolol tartrate, ondansetron, mouth rinse, phenol, polyethylene glycol, sodium chloride flush   Vital Signs    Vitals:   10/29/22 1930 10/29/22 2000 10/30/22 0015 10/30/22 0445  BP: 136/73 (!) 140/73 138/72 137/74  Pulse:   80   Resp: 19 (!) 23 (!) 22 20  Temp:    97.8 F (36.6 C)  TempSrc:    Oral  SpO2:    92%  Weight:      Height:        Intake/Output Summary (Last 24 hours) at 10/30/2022 0902 Last data filed at 10/30/2022 0800 Gross per 24 hour  Intake 855.52 ml  Output 2000 ml  Net -1144.48 ml      10/29/2022    4:11 AM 10/28/2022    7:28 AM 10/22/2022   10:57 AM  Last 3 Weights  Weight (lbs) 223 lb 15.8 oz 215 lb 219 lb  Weight (kg) 101.6 kg 97.523 kg 99.338 kg      Telemetry    SR - Personally Reviewed  ECG    N/a - Personally Reviewed  Physical Exam   GEN: No acute distress.   Neck: No JVD Cardiac:  RRR, no murmurs, rubs, or gallops.  Respiratory: Clear to auscultation bilaterally. GI: Soft, nontender, non-distended  MS: No edema; No deformity. Neuro:  Nonfocal  Psych: Normal affect   Labs    High Sensitivity Troponin:   Recent Labs  Lab 10/28/22 1150 10/28/22 1642 10/28/22 2347 10/29/22 0410  TROPONINIHS 73* 261* 294* 222*     Chemistry Recent Labs  Lab 10/28/22 1150 10/28/22 1152 10/29/22 0410 10/29/22 1303 10/29/22 1306 10/30/22 0122  NA 134*   < > 138 131* 140 136  K 4.6   < > 3.9 3.6 3.9 3.8  CL 105  --  108  --   --  104  CO2 19*  --  22  --   --  24  GLUCOSE 228*  --  128*  --   --  120*  BUN 17  --  11  --   --  12  CREATININE 1.05  --  0.85  --   --  0.97  CALCIUM 8.7*  --  8.8*  --   --  8.9  MG 1.7  --  1.9  --   --  1.8  PROT  --   --  6.2*  --   --  6.4*  ALBUMIN  --   --  3.3*  --   --  3.4*  AST  --   --  23  --   --  19  ALT  --   --  33  --   --  26  ALKPHOS  --   --  39  --   --  46  BILITOT  --   --  1.2  --   --  1.0  GFRNONAA >60  --  >60  --   --  >60  ANIONGAP 10  --  8  --   --  8   < > = values in this interval not displayed.    Lipids No results for input(s): "CHOL", "TRIG", "HDL", "LABVLDL", "LDLCALC", "CHOLHDL" in the last 168 hours.  Hematology Recent Labs  Lab 10/28/22 1150 10/28/22 1152 10/29/22 0410 10/29/22 1303 10/29/22 1306 10/30/22 0122  WBC 10.0  --  9.8  --   --  8.8  RBC 4.41  --  4.33  --   --  4.59  HGB 14.5   < > 14.4 13.6 13.6 14.7  HCT 42.7   < > 41.4 40.0 40.0 44.8  MCV 96.8  --  95.6  --   --  97.6  MCH 32.9  --  33.3  --   --  32.0  MCHC 34.0  --  34.8  --   --  32.8  RDW 12.3  --  12.5  --   --  12.5  PLT 189  --  133*  --   --  125*   < > = values in this interval not displayed.   Thyroid No results for input(s): "TSH", "FREET4" in the last 168 hours.  BNPNo results for input(s): "BNP", "PROBNP" in the last 168 hours.  DDimer No results for input(s): "DDIMER" in the last 168 hours.   Radiology     MR CARDIAC MORPHOLOGY W WO CONTRAST  Result Date: 10/30/2022 CLINICAL DATA:  9M with CAD s/p CABG, HFrEF (EF<20% on echo) presents with cardiac arrest. Cath shows patent RIMA-LAD and freee LIMA-diagonal, but occluded SVG-OM2 and SVG-LPDA EXAM: CARDIAC MRI TECHNIQUE: The patient was scanned on a 1.5 Tesla Siemens magnet. A dedicated cardiac coil was used. Functional imaging was done using Fiesta sequences. 2,3, and 4 chamber views were done to assess for RWMA's. Modified Simpson's rule using a short axis stack was used to calculate an ejection fraction on a dedicated work [employer], [employer]. The patient received 13 cc of Gadavist. After 10 minutes inversion recovery sequences were used to assess for infiltration and scar tissue. Phase contrast velocity mapping was performed above the aortic and pulmonic valves CONTRAST:  13 cc  of Gadavist FINDINGS: Left ventricle: -Mild dilatation -Severe systolic dysfunction Thinning/akinesis of basal to mid inferior and lateral walls -Elevated ECV (40%) -Subendocardial LGE consistent with infarct in basal to mid inferior wall and basal to apical lateral walls. LGE is greater than 50% transmural suggesting nonviability LV EF: 23% (Normal 49-79%) Absolute volumes: LV EDV: (Normal 95-215 mL) LV ESV: (Normal 25-85 mL) LV SV: 65mL (Normal 61-145 mL) CO: 4.5L/min (Normal 3.4-7.8 L/min) Indexed volumes: LV EDV: 121mL/sq-m (Normal 50-108 mL/sq-m) LV ESV: 71mL/sq-m (Normal 11-47 mL/sq-m) LV SV: 76mL/sq-m (Normal 33-72 mL/sq-m) CI: 2.0L/min/sq-m (Normal 1.8-4.2 L/min/sq-m) Right ventricle: Normal size and systolic function RV EF:  54% (Normal 51-80%)  Absolute volumes: RV EDV: (Normal 109-217 mL) RV ESV: 57mL (Normal 23-91 mL) RV SV: 66mL (Normal 71-141 mL) CO: 4.6L/min (Normal 2.8-8.8 L/min) Indexed volumes: RV EDV: 74mL/sq-m (Normal 58-109 mL/sq-m) RV ESV: 63mL/sq-m (Normal 12-46 mL/sq-m) RV SV: 45mL/sq-m (Normal 38-71 mL/sq-m) CI: 2.0L/min/sq-m (Normal  1.7-4.2 L/min/sq-m) Left atrium: Mild enlargement Right atrium: Normal size Mitral valve: No regurgitation Aortic valve: Trivial regurgitation Tricuspid valve: Mild regurgitation Pulmonic valve: No regurgitation Aorta: Normal proximal ascending aorta Pericardium: Normal IMPRESSION: 1. Subendocardial late gadolinium enhancement consistent with infarct in basal to mid inferior wall and basal to apical lateral wall. LGE is greater than 50% transmural suggesting inferior and lateral walls are nonviable 2. Mild LV dilatation with severe systolic dysfunction (EF 23%). Thinning/akinesis of basal to mid inferior and lateral walls 3.  Normal RV size and systolic function (EF 54%) Electronically Signed   By: Epifanio Lesches M.D.   On: 10/30/2022 00:14   MR CARDIAC VELOCITY FLOW MAP  Result Date: 10/30/2022 CLINICAL DATA:  51M with CAD s/p CABG, HFrEF (EF<20% on echo) presents with cardiac arrest. Cath shows patent RIMA-LAD and freee LIMA-diagonal, but occluded SVG-OM2 and SVG-LPDA EXAM: CARDIAC MRI TECHNIQUE: The patient was scanned on a 1.5 Tesla Siemens magnet. A dedicated cardiac coil was used. Functional imaging was done using Fiesta sequences. 2,3, and 4 chamber views were done to assess for RWMA's. Modified Simpson's rule using a short axis stack was used to calculate an ejection fraction on a dedicated work [employer] officer, [employer]. The patient received 13 cc of Gadavist. After 10 minutes inversion recovery sequences were used to assess for infiltration and scar tissue. Phase contrast velocity mapping was performed above the aortic and pulmonic valves CONTRAST:  13 cc  of Gadavist FINDINGS: Left ventricle: -Mild dilatation -Severe systolic dysfunction Thinning/akinesis of basal to mid inferior and lateral walls -Elevated ECV (40%) -Subendocardial LGE consistent with infarct in basal to mid inferior wall and basal to apical lateral walls. LGE is greater than 50% transmural suggesting nonviability LV EF:  23% (Normal 49-79%) Absolute volumes: LV EDV: (Normal 95-215 mL) LV ESV: (Normal 25-85 mL) LV SV: 65mL (Normal 61-145 mL) CO: 4.5L/min (Normal 3.4-7.8 L/min) Indexed volumes: LV EDV: 172mL/sq-m (Normal 50-108 mL/sq-m) LV ESV: 51mL/sq-m (Normal 11-47 mL/sq-m) LV SV: 20mL/sq-m (Normal 33-72 mL/sq-m) CI: 2.0L/min/sq-m (Normal 1.8-4.2 L/min/sq-m) Right ventricle: Normal size and systolic function RV EF:  54% (Normal 51-80%) Absolute volumes: RV EDV: (Normal 109-217 mL) RV ESV: 57mL (Normal 23-91 mL) RV SV: 66mL (Normal 71-141 mL) CO: 4.6L/min (Normal 2.8-8.8 L/min) Indexed volumes: RV EDV: 76mL/sq-m (Normal 58-109 mL/sq-m) RV ESV: 62mL/sq-m (Normal 12-46 mL/sq-m) RV SV: 28mL/sq-m (Normal 38-71 mL/sq-m) CI: 2.0L/min/sq-m (Normal 1.7-4.2 L/min/sq-m) Left atrium: Mild enlargement Right atrium: Normal size Mitral valve: No regurgitation Aortic valve: Trivial regurgitation Tricuspid valve: Mild regurgitation Pulmonic valve: No regurgitation Aorta: Normal proximal ascending aorta Pericardium: Normal IMPRESSION: 1. Subendocardial late gadolinium enhancement consistent with infarct in basal to mid inferior wall and basal to apical lateral wall. LGE is greater than 50% transmural suggesting inferior and lateral walls are nonviable 2. Mild LV dilatation with severe systolic dysfunction (EF 23%). Thinning/akinesis of basal to mid inferior and lateral walls 3.  Normal RV size and systolic function (EF 54%) Electronically Signed   By: Epifanio Lesches M.D.   On: 10/30/2022 00:14   MR CARDIAC VELOCITY FLOW MAP  Result Date: 10/30/2022 CLINICAL DATA:  51M with CAD s/p CABG, HFrEF (EF<20% on echo) presents with cardiac arrest. Cath  shows patent RIMA-LAD and freee LIMA-diagonal, but occluded SVG-OM2 and SVG-LPDA EXAM: CARDIAC MRI TECHNIQUE: The patient was scanned on a 1.5 Tesla Siemens magnet. A dedicated cardiac coil was used. Functional imaging was done using Fiesta sequences. 2,3, and 4 chamber views were  done to assess for RWMA's. Modified Simpson's rule using a short axis stack was used to calculate an ejection fraction on a dedicated work [employer] officer, [employer]. The patient received 13 cc of Gadavist. After 10 minutes inversion recovery sequences were used to assess for infiltration and scar tissue. Phase contrast velocity mapping was performed above the aortic and pulmonic valves CONTRAST:  13 cc  of Gadavist FINDINGS: Left ventricle: -Mild dilatation -Severe systolic dysfunction Thinning/akinesis of basal to mid inferior and lateral walls -Elevated ECV (40%) -Subendocardial LGE consistent with infarct in basal to mid inferior wall and basal to apical lateral walls. LGE is greater than 50% transmural suggesting nonviability LV EF: 23% (Normal 49-79%) Absolute volumes: LV EDV: (Normal 95-215 mL) LV ESV: (Normal 25-85 mL) LV SV: 65mL (Normal 61-145 mL) CO: 4.5L/min (Normal 3.4-7.8 L/min) Indexed volumes: LV EDV: 132mL/sq-m (Normal 50-108 mL/sq-m) LV ESV: 60mL/sq-m (Normal 11-47 mL/sq-m) LV SV: 23mL/sq-m (Normal 33-72 mL/sq-m) CI: 2.0L/min/sq-m (Normal 1.8-4.2 L/min/sq-m) Right ventricle: Normal size and systolic function RV EF:  54% (Normal 51-80%) Absolute volumes: RV EDV: (Normal 109-217 mL) RV ESV: 57mL (Normal 23-91 mL) RV SV: 66mL (Normal 71-141 mL) CO: 4.6L/min (Normal 2.8-8.8 L/min) Indexed volumes: RV EDV: 41mL/sq-m (Normal 58-109 mL/sq-m) RV ESV: 17mL/sq-m (Normal 12-46 mL/sq-m) RV SV: 62mL/sq-m (Normal 38-71 mL/sq-m) CI: 2.0L/min/sq-m (Normal 1.7-4.2 L/min/sq-m) Left atrium: Mild enlargement Right atrium: Normal size Mitral valve: No regurgitation Aortic valve: Trivial regurgitation Tricuspid valve: Mild regurgitation Pulmonic valve: No regurgitation Aorta: Normal proximal ascending aorta Pericardium: Normal IMPRESSION: 1. Subendocardial late gadolinium enhancement consistent with infarct in basal to mid inferior wall and basal to apical lateral wall. LGE is greater than 50%  transmural suggesting inferior and lateral walls are nonviable 2. Mild LV dilatation with severe systolic dysfunction (EF 23%). Thinning/akinesis of basal to mid inferior and lateral walls 3.  Normal RV size and systolic function (EF 54%) Electronically Signed   By: Epifanio Lesches M.D.   On: 10/30/2022 00:14   CARDIAC CATHETERIZATION  Result Date: 10/29/2022 1.  Severe native vessel coronary artery disease with total occlusion of the proximal LAD and subtotal occlusion of the proximal and mid circumflex, total occlusion of the distal circumflex, and total occlusion of the midportion of the nondominant RCA 2.  Status post aortocoronary bypass surgery with continued patency of the RIMA to LAD and free LIMA to diagonal.  Total occlusion of the saphenous vein graft to OM 2 and saphenous vein graft to left PDA 3.  Preserved cardiac output of 4.75, cardiac index 2.12, mild pulmonary hypertension with a mean PA pressure of 27 mmHg and transpulmonary gradient of 12 mmHg, PVR of 2.5 Wood units Recommend ongoing medical therapy for CAD and ischemic cardiomyopathy, no targets for PCI   DG Chest Port 1 View  Result Date: 10/28/2022 CLINICAL DATA:  Cardiac arrest EXAM: PORTABLE CHEST 1 VIEW COMPARISON:  CXR 02/19/04 FINDINGS: Right-sided central venous catheter with tip in the upper SVC. Status post median sternotomy. No pleural effusion. No pneumothorax. No focal airspace opacity. Normal cardiac and mediastinal contours. No radiographically apparent displaced rib fractures. Visualized upper abdomen is unremarkable. Vertebral body heights are maintained. IMPRESSION: Right-sided central venous catheter with tip in the upper SVC. No pneumothorax  Electronically Signed   By: Lorenza Cambridge M.D.   On: 10/28/2022 13:21   ECHOCARDIOGRAM COMPLETE  Result Date: 10/28/2022    ECHOCARDIOGRAM REPORT   Patient Name:   [name] Date of Exam: 10/28/2022 Medical Rec #:  [id number]      Height:       72.0 in Accession #:     [id number]     Weight:       215.0 lb Date of Birth:  02-24-1956       BSA:          2.197 m Patient Age:    67 years       BP:           104/52 mmHg Patient Gender: M              HR:           73 bpm. Exam Location:  Inpatient Procedure: 2D Echo, Color Doppler, Cardiac Doppler and Intracardiac            Opacification Agent Indications:    Cardiac arrest I46.9  History:        Patient has no prior history of Echocardiogram examinations.                 CHF, CAD, Prior CABG, TIA and Carotid Disease, Aortic Valve                 Disease; Risk Factors:Former Smoker, Hypertension, Dyslipidemia,                 Sleep Apnea and Diabetes.  Sonographer:    Aron Baba Referring Phys: [id number] Lorin Glass  Sonographer Comments: Echo performed with patient supine and on artificial respirator and Technically difficult study due to poor echo windows. Image acquisition challenging due to patient body habitus and Image acquisition challenging due to respiratory  motion. IMPRESSIONS  1. Left ventricular ejection fraction, by estimation, is <20%. The left ventricle has severely decreased function. The left ventricle demonstrates global hypokinesis. The left ventricular internal cavity size was severely dilated. Left ventricular diastolic parameters are consistent with Grade II diastolic dysfunction (pseudonormalization). Elevated left ventricular end-diastolic pressure.  2. Right ventricular systolic function is moderately reduced. The right ventricular size is normal. There is normal pulmonary artery systolic pressure. The estimated right ventricular systolic pressure is 22.2 mmHg.  3. Left atrial size was mildly dilated.  4. The mitral valve is normal in structure. Trivial mitral valve regurgitation. No evidence of mitral stenosis.  5. The aortic valve was not well visualized. Aortic valve regurgitation is not visualized. Aortic valve sclerosis/calcification is present, without any evidence of aortic stenosis.  6. The  inferior vena cava is dilated in size with <50% respiratory variability, suggesting right atrial pressure of 15 mmHg. FINDINGS  Left Ventricle: Left ventricular ejection fraction, by estimation, is <20%. The left ventricle has severely decreased function. The left ventricle demonstrates global hypokinesis. Definity contrast agent was given IV to delineate the left ventricular endocardial borders. The left ventricular internal cavity size was severely dilated. There is no left ventricular hypertrophy. Left ventricular diastolic parameters are consistent with Grade II diastolic dysfunction (pseudonormalization). Elevated left ventricular end-diastolic pressure. Right Ventricle: The right ventricular size is normal. No increase in right ventricular wall thickness. Right ventricular systolic function is moderately reduced. There is normal pulmonary artery systolic pressure. The tricuspid regurgitant velocity is 1.34 m/s, and with an assumed right atrial pressure of 15 mmHg, the estimated right  ventricular systolic pressure is 22.2 mmHg. Left Atrium: Left atrial size was mildly dilated. Right Atrium: Right atrial size was normal in size. Pericardium: There is no evidence of pericardial effusion. Mitral Valve: The mitral valve is normal in structure. Trivial mitral valve regurgitation. No evidence of mitral valve stenosis. Tricuspid Valve: The tricuspid valve is normal in structure. Tricuspid valve regurgitation is trivial. No evidence of tricuspid stenosis. Aortic Valve: The aortic valve was not well visualized. Aortic valve regurgitation is not visualized. Aortic valve sclerosis/calcification is present, without any evidence of aortic stenosis. Pulmonic Valve: The pulmonic valve was normal in structure. Pulmonic valve regurgitation is not visualized. No evidence of pulmonic stenosis. Aorta: The aortic root is normal in size and structure. Venous: The inferior vena cava is dilated in size with less than 50% respiratory  variability, suggesting right atrial pressure of 15 mmHg. IAS/Shunts: No atrial level shunt detected by color flow Doppler.  LEFT VENTRICLE PLAX 2D LVIDd:         6.20 cm   Diastology LVIDs:         5.80 cm   LV e' medial:    3.81 cm/s LV PW:         1.00 cm   LV E/e' medial:  19.2 LV IVS:        0.80 cm   LV e' lateral:   5.44 cm/s LVOT diam:     2.20 cm   LV E/e' lateral: 13.5 LV SV:         39 LV SV Index:   18 LVOT Area:     3.80 cm  RIGHT VENTRICLE RV S prime:     9.46 cm/s TAPSE (M-mode): 1.2 cm LEFT ATRIUM             Index        RIGHT ATRIUM           Index LA diam:        5.00 cm 2.28 cm/m   RA Area:     22.20 cm LA Vol (A2C):   80.6 ml 36.69 ml/m  RA Volume:   64.40 ml  29.31 ml/m LA Vol (A4C):   69.4 ml 31.59 ml/m LA Biplane Vol: 77.0 ml 35.05 ml/m  AORTIC VALVE LVOT Vmax:   53.80 cm/s LVOT Vmean:  38.200 cm/s LVOT VTI:    0.103 m  AORTA Ao Root diam: 3.40 cm MITRAL VALVE               TRICUSPID VALVE MV Area (PHT): 4.80 cm    TR Peak grad:   7.2 mmHg MV Decel Time: 158 msec    TR Vmax:        134.00 cm/s MV E velocity: 73.30 cm/s MV A velocity: 82.90 cm/s  SHUNTS MV E/A ratio:  0.88        Systemic VTI:  0.10 m                            Systemic Diam: 2.20 cm Armanda Magic MD Electronically signed by Armanda Magic MD Signature Date/Time: 10/28/2022/1:05:01 PM    Final    PERIPHERAL VASCULAR CATHETERIZATION  Result Date: 10/28/2022 See surgical note for result.  HYBRID OR IMAGING ([facility] ONLY)  Result Date: 10/28/2022 There is no interpretation for this exam.  This order is for images obtained during a surgical procedure.  Please See "Surgeries" Tab for more information regarding the procedure.    Cardiac  Studies    Patient Profile     67 y.o. male with a history of CAD s/p CABG x4 in 01/2002, chronic HFrEF with EF of 30-35%, carotid artery disease s/p right CEA and patch angioplasty in 11/2016, PAD s/p right iliofemoral artery endarterectomy in May and patch angioplasty as well as  stenting of bilateral common iliac arteries in 04/2021, hypertension, hyperlipidemia, type 2 diabetes mellitus, and TIA who is being seen  for the evaluation of VT arrest   Assessment & Plan    1.Cardiac arrest - Presented for planned right transcarotid artery revascularization on 4/25, had cardiac arrest shortly after procedure started.  - Initial rhythm was pulseless VT. Chest compressions were started and patient was shocked x 1 with quick return of ROSC.  - Subsequently had second episode of VT but ROSC was obtained after brief ACLS   - Echo showed EF less than 20%, severe LV dilatation, grade 2 diastolic dysfunction, moderately reduced RV function, no significant valvular disease  - RHC/LHC: ostial LCX 99% and distal occluded, RCA mid occlusion, LIMA-diag patent, RIMA-mid LAD patent, SVG-Om2 occlude,d SVG-LPDA occluded. No PCI targets - mean PA 27, PCWP 15, CI 2.12 -cMRI  Subendocardial late gadolinium enhancement consistent with infarct in basal to mid inferior wall and basal to apical lateral wall. LGE is greater than 50% transmural suggesting inferior and lateral walls are nonviable. LVEF 23% - EP to see to consider ICD for secondary prevention on Monday   2.Acute on chronic HFrEF - 2015 echo LVEF 30-35%  Echo this admit showed EF less than 20%, severe LV dilatation, grade 2 diastolic dysfunction, moderately reduced RV function, no significant valvular disease   - meds held initialy due to low bp's - now back on coreg 3.125mg  bid, jardiance 10mg , crestor 10, entresto 24/26mg  bid, aldactone 12.5mg .   For questions or updates, please contact [facility] Please consult [email or web address] for contact info under        Signed, Dina Rich, MD  10/30/2022, 9:02 AM

## 2022-10-30 NOTE — Progress Notes (Addendum)
  Progress Note    10/30/2022 8:13 AM 1 Day Post-Op  Subjective:  some chest soreness otherwise feeling fine   Vitals:   10/30/22 0015 10/30/22 0445  BP: 138/72 137/74  Pulse: 80   Resp: (!) 22 20  Temp:  97.8 F (36.6 C)  SpO2:  92%   Physical Exam: Cardiac:  regular Lungs:  non labored Extremities:  moving all extremities without deficits. Palpable Radial and DP pulses bilaterally Abdomen:  soft, non distended Neurologic: alert and oriented  CBC    Component Value Date/Time   WBC 8.8 10/30/2022 0122   RBC 4.59 10/30/2022 0122   HGB 14.7 10/30/2022 0122   HCT 44.8 10/30/2022 0122   PLT 125 (L) 10/30/2022 0122   MCV 97.6 10/30/2022 0122   MCH 32.0 10/30/2022 0122   MCHC 32.8 10/30/2022 0122   RDW 12.5 10/30/2022 0122   LYMPHSABS 1.5 10/28/2022 1150   MONOABS 0.5 10/28/2022 1150   EOSABS 0.2 10/28/2022 1150   BASOSABS 0.0 10/28/2022 1150    BMET    Component Value Date/Time   NA 136 10/30/2022 0122   K 3.8 10/30/2022 0122   CL 104 10/30/2022 0122   CO2 24 10/30/2022 0122   GLUCOSE 120 (H) 10/30/2022 0122   BUN 12 10/30/2022 0122   CREATININE 0.97 10/30/2022 0122   CALCIUM 8.9 10/30/2022 0122   GFRNONAA >60 10/30/2022 0122   GFRAA >60 05/22/2018 1049    INR    Component Value Date/Time   INR 1.1 10/29/2022 1522     Intake/Output Summary (Last 24 hours) at 10/30/2022 0813 Last data filed at 10/29/2022 2000 Gross per 24 hour  Intake 615.52 ml  Output 2000 ml  Net -1384.48 ml     Assessment/Plan:  67 y.o. male is s/p aborted right TCAR due to cardiac arrest x 2  Neuro exam at baseline Cath showed severe multilevel CAD no amenable to intervention - medical management Echo showed EF of <20% Eval pending for possible ICD Appreciate Cardiology assistance Dr. Lenell Antu will consider revascularization of R ICA when stable from a cardiac standpoint   DVT prophylaxis:  Heparin gtt   Graceann Congress, PA-C [facility] [phone] 10/30/2022 8:13 AM  VASCULAR STAFF ADDENDUM: I have independently interviewed and examined the patient. I agree with the above.  Will await EP plan for patient before addressing carotid. ASA / Plavix until carotid lesion can be treated.  Rande Brunt. Lenell Antu, MD [facility] Phone Number: [phone] 10/30/2022 9:33 AM

## 2022-10-31 DIAGNOSIS — I469 Cardiac arrest, cause unspecified: Secondary | ICD-10-CM | POA: Diagnosis not present

## 2022-10-31 LAB — MAGNESIUM: Magnesium: 1.8 mg/dL (ref 1.7–2.4)

## 2022-10-31 LAB — COMPREHENSIVE METABOLIC PANEL
ALT: 23 U/L (ref 0–44)
AST: 17 U/L (ref 15–41)
Albumin: 3.5 g/dL (ref 3.5–5.0)
Alkaline Phosphatase: 48 U/L (ref 38–126)
Anion gap: 9 (ref 5–15)
BUN: 16 mg/dL (ref 8–23)
CO2: 25 mmol/L (ref 22–32)
Calcium: 9.1 mg/dL (ref 8.9–10.3)
Chloride: 103 mmol/L (ref 98–111)
Creatinine, Ser: 1.07 mg/dL (ref 0.61–1.24)
GFR, Estimated: >60 mL/min (ref >60)
Glucose, Bld: 123 mg/dL — ABNORMAL HIGH (ref 70–99)
Potassium: 4.1 mmol/L (ref 3.5–5.1)
Sodium: 137 mmol/L (ref 135–145)
Total Bilirubin: 1.1 mg/dL (ref 0.3–1.2)
Total Protein: 6.6 g/dL (ref 6.5–8.1)

## 2022-10-31 LAB — PHOSPHORUS: Phosphorus: 4.8 mg/dL — ABNORMAL HIGH (ref 2.5–4.6)

## 2022-10-31 LAB — CBC
HCT: 43.4 % (ref 39.0–52.0)
Hemoglobin: 14.4 g/dL (ref 13.0–17.0)
MCH: 32.1 pg (ref 26.0–34.0)
MCHC: 33.2 g/dL (ref 30.0–36.0)
MCV: 96.9 fL (ref 80.0–100.0)
Platelets: 131 10*3/uL — ABNORMAL LOW (ref 150–400)
RBC: 4.48 MIL/uL (ref 4.22–5.81)
RDW: 12.4 % (ref 11.5–15.5)
WBC: 6.3 10*3/uL (ref 4.0–10.5)
nRBC: 0 % (ref 0.0–0.2)

## 2022-10-31 LAB — GLUCOSE, CAPILLARY
Glucose-Capillary: 185 mg/dL — ABNORMAL HIGH (ref 70–99)
Glucose-Capillary: 115 mg/dL — ABNORMAL HIGH (ref 70–99)
Glucose-Capillary: 143 mg/dL — ABNORMAL HIGH (ref 70–99)
Glucose-Capillary: 225 mg/dL — ABNORMAL HIGH (ref 70–99)

## 2022-10-31 MED ORDER — CARVEDILOL 3.125 MG PO TABS
3.1250 mg | ORAL_TABLET | Freq: Once | ORAL | Status: AC
  Administered 2022-10-31: 3.125 mg via ORAL
  Filled 2022-10-31: qty 1

## 2022-10-31 MED ORDER — CARVEDILOL 6.25 MG PO TABS
6.2500 mg | ORAL_TABLET | Freq: Two times a day (BID) | ORAL | Status: DC
  Administered 2022-10-31 – 2022-11-03 (×6): 6.25 mg via ORAL
  Filled 2022-10-31 (×6): qty 1

## 2022-10-31 MED ORDER — MAGNESIUM SULFATE 2 GM/50ML IV SOLN
2.0000 g | Freq: Once | INTRAVENOUS | Status: AC
  Administered 2022-10-31: 2 g via INTRAVENOUS
  Filled 2022-10-31: qty 50

## 2022-10-31 NOTE — Progress Notes (Addendum)
  Progress Note    10/31/2022 7:52 AM 2 Days Post-Op  Subjective:  sitting up in chair. No complaints this morning   Vitals:   10/30/22 2000 10/31/22 0000  BP: 113/65 114/71  Pulse: 72 69  Resp: 18 18  Temp: 97.8 F (36.6 C) 97.8 F (36.6 C)  SpO2: 98% 99%   Physical Exam: Cardiac:  regular Lungs:  non labored Extremities:  moving all extremities without deficits Abdomen:  soft Neurologic: alert and oriented  CBC    Component Value Date/Time   WBC 6.3 10/31/2022 0114   RBC 4.48 10/31/2022 0114   HGB 14.4 10/31/2022 0114   HCT 43.4 10/31/2022 0114   PLT 131 (L) 10/31/2022 0114   MCV 96.9 10/31/2022 0114   MCH 32.1 10/31/2022 0114   MCHC 33.2 10/31/2022 0114   RDW 12.4 10/31/2022 0114   LYMPHSABS 1.5 10/28/2022 1150   MONOABS 0.5 10/28/2022 1150   EOSABS 0.2 10/28/2022 1150   BASOSABS 0.0 10/28/2022 1150    BMET    Component Value Date/Time   NA 137 10/31/2022 0114   K 4.1 10/31/2022 0114   CL 103 10/31/2022 0114   CO2 25 10/31/2022 0114   GLUCOSE 123 (H) 10/31/2022 0114   BUN 16 10/31/2022 0114   CREATININE 1.07 10/31/2022 0114   CALCIUM 9.1 10/31/2022 0114   GFRNONAA >60 10/31/2022 0114   GFRAA >60 05/22/2018 1049    INR    Component Value Date/Time   INR 1.1 10/29/2022 1522     Intake/Output Summary (Last 24 hours) at 10/31/2022 1610 Last data filed at 10/30/2022 0800 Gross per 24 hour  Intake 240 ml  Output --  Net 240 ml     Assessment/Plan:  67 y.o. 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 I agree with the above.   Rande Brunt. Lenell Antu, MD [facility] Phone Number: [phone] 10/31/2022 9:06 AM

## 2022-10-31 NOTE — Consult Note (Incomplete)
ELECTROPHYSIOLOGY CONSULT NOTE    Patient ID: [name] MRN: [id number], DOB/AGE: 1956/01/23 67 y.o.  Admit date: 10/28/2022 Date of Consult: 11/01/2022  Primary Physician: Tresa Garter, MD Primary Cardiologist: Kristeen Miss, MD  Electrophysiologist: Ellis Parents Graciela Husbands previously consulted 2015)   Referring Provider: Dr. Bjorn Pippin  Patient Profile: [name] is a 67 y.o. male with a h/o CAD s/p CABG x4 in 01/2002, chronic HFrEF with EF of 30-35%, carotid artery disease s/p right CEA and patch angioplasty in 11/2016, PAD s/p right iliofemoral artery endarterectomy in May and patch angioplasty as well as stenting of bilateral common iliac arteries in 04/2021, hypertension, hyperlipidemia, type 2 diabetes mellitus, and TIA who is being seen 10/28/2022 for the evaluation of VT arrest at the request of Dr. Bjorn Pippin  HPI:  [name] is a 67 year old male with the above history who was previously followed by Dr. Myrtis Ser and now follows with [name].  Patient has a long history of CAD and underwent CABG x4 (LIMA as a free graft to the diagonal, RIMA to LAD, SVG to OM 2, and SVG to PDA) in 01/2002 with Dr. Dorris Fetch. It does not look like he has had any repeat cardiac catheterization since that time. Last ischemic evaluation was a Myoview in 11/2016 which showed a large inferior lateral wall infarct from the apex to the base but no ischemia.  He also has known CHF with last known EF of 30-35% on Echo in 2015.  In addition, he has known carotid artery disease and PAD with prior interventions as described above.  He was last seen by [name] in 2019.   Patient presented to [facility] 4/25 for planned right transcarotid artery revascularization with Dr. Lenell Antu.  Patient was intubated and sedated.  However, procedure was aborted shortly after procedure began due to VT arrest.  PA states that pt went into VT after the femoral vein wire was pulled out.  Compressions were started and he  underwent 1 shock with ROSC.  However, he then went back into VT.  Compressions were restarted with return of ROSC prior to need for additional shock.  BP was reportedly markedly elevated at the time systolic BP in the 250s. Dr. Lenell Antu who states ROSC was obtained in less than 45 seconds during the first VT arrest. He states he was calling the patient's wife during the secondary so he did not see the actual rhythm but mentions that patient became brady'd down and then arrested. He estimates ROSC was obtained within 1-2 minutes that time. He was started on Epinephrine and Neo-Synephrine drip in the OR and then transported to the ED. Post arrest EKG showed sinus bradycardia, rate 55 bpm, with Q waves in lead III and minimal ST depression in leads V4-V6 as well as nonspecific T wave changes in inferior leads and 1 and aVL.  No strips of arrest, unfortunately.   Echo 4/25 LVEF <20%, severe LV diltation, grade 2 DD, Mod reduced RV function  RHC/LHC 4/26: ostial LCX 99% and distal occluded, RCA mid occlusion, LIMA-diag patent, RIMA-mid LAD patent, SVG-Om2 occlude,d SVG-LPDA occluded. No PCI targets Mean PA 27, PCWP 15, CI 2.12  cMRI 4/27 Subendocardial late gadolinium enhancement consistent with infarct in basal to mid inferior wall and basal to apical lateral wall. LGE is greater than 50% transmural suggesting inferior and lateral walls are nonviable. LVEF 23%  Today pt is ambulatory, bright and alert.  Some chest soreness from CPR, but otherwise no complaints.  Denies angina PTA.  No h/o syncope. Counts himself lucky that arrest happened while in hospital.   Labs Potassium3.1* (04/29 0143) Magnesium  2.0 (04/29 0143) Creatinine, ser  1.10 (04/29 0143) PLT  137* (04/29 0143) HGB  14.3 (04/29 0143) WBC 7.6 (04/29 0143)  .    Past Medical History:  Diagnosis Date   Aortic valve sclerosis    aortic valve sclerosis   Arthritis    CAD (coronary artery disease)    Myoview 5/18: Large inferior  lateral wall infarct from apex to base no ischemia EF 27%   Carotid artery disease (HCC)    chronic total occlusion LCCA, retrograde flow from LECA to LICA, ,, 0-39%LICA, s/p Right CEA 2018   CHF (congestive heart failure) (HCC)    Closed head injury with concussion    x2   Complication of anesthesia    Low blood pressure after colonoscopy   Diabetes mellitus without complication (HCC)    Dizziness    Ejection fraction < 50%    EF 40%, echo 2006, no ICD needed  / EF 40%, echo, 11/2009   Former cigarette smoker    Hx of CABG 01/2002   01/2002   Hyperlipidemia    Low HDL   Hypertension    Mitral regurgitation    mild, echo, 11/2009   Myocardial infarction [facility])    "they told me i had a silent heart attack"   Peripheral vascular disease (HCC)    Dr. Georganna Skeans   Sleep disorder    Dr Shelle Iron, lose weight and consider sleep study later   Sleep disorder    TIA (transient ischemic attack)    by history   Volume overload      Surgical History:  Past Surgical History:  Procedure Laterality Date   CHOLECYSTECTOMY     COLONOSCOPY  03/19/2009   CORONARY ARTERY BYPASS GRAFT     ENDARTERECTOMY Right 11/05/2016   Procedure: ENDARTERECTOMY RIGHT CAROTID;  Surgeon: Larina Earthly, MD;  Location[facility] OR;  Service: Vascular;  Laterality: Right;   ENDARTERECTOMY FEMORAL Right 04/29/2021   Procedure: RIGHT ILIOFEMORAL ARTERY ENDARTERECTOMY WITH PROFUNDOPLASTY;  Surgeon: Leonie Douglas, MD;  Location: [facility] OR;  Service: Vascular;  Laterality: Right;   INSERTION OF ILIAC STENT Right 04/29/2021   Procedure: Ronny Flurry INSERTION OF BILATERAL COMMON ILIAC ARTERY KISSING STENTS;  Surgeon: Leonie Douglas, MD;  Location: [facility] OR;  Service: Vascular;  Laterality: Right;   PATCH ANGIOPLASTY Right 11/05/2016   Procedure: RIGHT CAROTID ARTERY PATCH ANGIOPLASTY USING HEMASHIELD PLATINUM FINESSE PATCH;  Surgeon: Larina Earthly, MD;  Location: [facility] OR;  Service: Vascular;  Laterality: Right;   PATCH ANGIOPLASTY Right  04/29/2021   Procedure: PATCH ANGIOPLASTY OF RIGHT COMMON FEMORAL ARTERY UISNG XENOSURE BOVINE PERICARDIUM PACTH;  Surgeon: Leonie Douglas, MD;  Location: [facility] OR;  Service: Vascular;  Laterality: Right;   RIGHT/LEFT HEART CATH AND CORONARY/GRAFT ANGIOGRAPHY N/A 10/29/2022   Procedure: RIGHT/LEFT HEART CATH AND CORONARY/GRAFT ANGIOGRAPHY;  Surgeon: Tonny Bollman, MD;  Location: [facility] INVASIVE CV LAB;  Service: Cardiovascular;  Laterality: N/A;   SHOULDER ARTHROSCOPY Right 05/25/2018   Procedure: RIGHT ARTHROSCOPY SHOULDER LABRAL DEBRIDEMENT, rotator cuff debridement,  BICEPS TENOTOMY and subacromial decompression;  Surgeon: Jones Broom, MD;  Location: [facility] OR;  Service: Orthopedics;  Laterality: Right;   TRANSCAROTID ARTERY REVASCULARIZATION  Right 10/28/2022   Procedure: Right Transcarotid Artery Revascularization;  Surgeon: Leonie Douglas, MD;  Location: [facility] OR;  Service: Vascular;  Laterality: Right;   ULTRASOUND GUIDANCE  FOR VASCULAR ACCESS Left 04/29/2021   Procedure: ULTRASOUND GUIDANCE FOR VASCULAR ACCESS, LEFT FEMORAL ARTERY;  Surgeon: Leonie Douglas, MD;  Location: [facility] OR;  Service: Vascular;  Laterality: Left;     Medications Prior to Admission  Medication Sig Dispense Refill Last Dose   aspirin 81 MG chewable tablet Chew 81 mg by mouth daily.   10/28/2022 at 0600   b complex vitamins capsule Take 1 capsule by mouth daily.   Past Week   carvedilol (COREG) 25 MG tablet TAKE 1 TABLET (25 MG TOTAL) BY MOUTH 2 (TWO) TIMES DAILY WITH A MEAL. 180 tablet 3 10/28/2022 at 0600   Cholecalciferol (VITAMIN D3) 50 MCG (2000 UT) TABS Take 2,000 Units by mouth daily.   Past Week   clopidogrel (PLAVIX) 75 MG tablet Take 1 tablet (75 mg total) by mouth daily. 30 tablet 6 10/28/2022 at 0600   empagliflozin (JARDIANCE) 25 MG TABS tablet Take 25 mg by mouth daily. (Patient taking differently: Take 12.5 mg by mouth daily.) 30 tablet 11 Past Week   isosorbide dinitrate (ISORDIL) 10 MG tablet Take 10 mg by  mouth 3 (three) times daily.   10/28/2022 at 0600   magnesium oxide (MAG-OX) 400 (240 Mg) MG tablet Take 400 mg by mouth at bedtime.   Past Week   pantoprazole (PROTONIX) 40 MG tablet TAKE 1 TABLET (40 MG TOTAL) BY MOUTH DAILY. (Patient taking differently: Take 40 mg by mouth at bedtime.) 90 tablet 0 10/27/2022   rosuvastatin (CRESTOR) 20 MG tablet Take 10 mg by mouth daily.   10/28/2022 at 0600   sacubitril-valsartan (ENTRESTO) 97-103 MG Take 1 tablet by mouth 2 (two) times daily.   10/27/2022   Semaglutide,0.25 or 0.5MG /DOS, (OZEMPIC, 0.25 OR 0.5 MG/DOSE,) 2 MG/1.5ML SOPN Inject 0.5 mg into the skin once a week. If unavailable, please recommend alternative. (Patient taking differently: Inject 0.5 mg into the skin once a week. If unavailable, please recommend alternative. SAT) 1.5 mL 3 10/16/2022   spironolactone (ALDACTONE) 25 MG tablet Take 25 mg by mouth daily.    10/27/2022   diclofenac Sodium (VOLTAREN) 1 % GEL Apply 2 g topically daily as needed (pain).   More than a month   linagliptin (TRADJENTA) 5 MG TABS tablet Take 1 tablet (5 mg total) by mouth daily. (Patient not taking: Reported on 10/21/2022) 90 tablet 3 Not Taking    Inpatient Medications:   aspirin  325 mg Per Tube Once   Followed by   aspirin  81 mg Per Tube Daily   carvedilol  6.25 mg Oral BID WC   cholecalciferol  2,000 Units Oral Daily   empagliflozin  10 mg Oral Daily   insulin aspart  0-9 Units Subcutaneous Q4H   pantoprazole  40 mg Oral Daily   rosuvastatin  10 mg Oral Daily   sacubitril-valsartan  1 tablet Oral BID   sodium chloride flush  3 mL Intravenous Q12H   sodium chloride flush  3 mL Intravenous Q12H   spironolactone  12.5 mg Oral Daily    Allergies: No Known Allergies  Family History  Problem Relation Age of Onset   Stroke Mother    Diabetes Father    COPD Father    Arthritis Father    Heart disease Father    Coronary artery disease Other        Male 1st degree relative <50   Diabetes Other         1st degree relative   Stroke Other  Male 1st degree relative<50   Deep vein thrombosis Daughter    Pulmonary fibrosis Daughter      Physical Exam: Vitals:   10/31/22 2333 11/01/22 0357 11/01/22 0436 11/01/22 0805  BP: (!) 93/52 124/73  130/68  Pulse: 80 80  80  Resp: 20 20  18   Temp: 97.8 F (36.6 C) 97.7 F (36.5 C)  98.1 F (36.7 C)  TempSrc: Oral Oral  Oral  SpO2: 98% 98%  100%  Weight:   98.7 kg   Height:        GEN- NAD, A&O x 3, normal affect HEENT: Normocephalic, atraumatic Lungs- CTAB, Normal effort.  Heart- Regular rate and rhythm, No M/G/R.  GI- Soft, NT, ND.  Extremities- No clubbing, cyanosis, or edema   Radiology/Studies: CARDIAC CATHETERIZATION  Result Date: 11/01/2022 1.  Severe native vessel coronary artery disease with total occlusion of the proximal LAD and subtotal occlusion of the proximal and mid circumflex, total occlusion of the distal circumflex, and total occlusion of the midportion of the nondominant RCA 2.  Status post aortocoronary bypass surgery with continued patency of the RIMA to LAD and free LIMA to diagonal.  Total occlusion of the saphenous vein graft to OM 2 and saphenous vein graft to left PDA 3.  Preserved cardiac output of 4.75, cardiac index 2.12, mild pulmonary hypertension with a mean PA pressure of 27 mmHg and transpulmonary gradient of 12 mmHg, PVR of 2.5 Wood units Recommend ongoing medical therapy for CAD and ischemic cardiomyopathy, no targets for PCI   MR CARDIAC MORPHOLOGY W WO CONTRAST  Result Date: 10/30/2022 CLINICAL DATA:  23M with CAD s/p CABG, HFrEF (EF<20% on echo) presents with cardiac arrest. Cath shows patent RIMA-LAD and freee LIMA-diagonal, but occluded SVG-OM2 and SVG-LPDA EXAM: CARDIAC MRI TECHNIQUE: The patient was scanned on a 1.5 Tesla Siemens magnet. A dedicated cardiac coil was used. Functional imaging was done using Fiesta sequences. 2,3, and 4 chamber views were done to assess for RWMA's. Modified  Simpson's rule using a short axis stack was used to calculate an ejection fraction on a dedicated work [employer]. The patient received 13 cc of Gadavist. After 10 minutes inversion recovery sequences were used to assess for infiltration and scar tissue. Phase contrast velocity mapping was performed above the aortic and pulmonic valves CONTRAST:  13 cc  of Gadavist FINDINGS: Left ventricle: -Mild dilatation -Severe systolic dysfunction Thinning/akinesis of basal to mid inferior and lateral walls -Elevated ECV (40%) -Subendocardial LGE consistent with infarct in basal to mid inferior wall and basal to apical lateral walls. LGE is greater than 50% transmural suggesting nonviability LV EF: 23% (Normal 49-79%) Absolute volumes: LV EDV: (Normal 95-215 mL) LV ESV: (Normal 25-85 mL) LV SV: 65mL (Normal 61-145 mL) CO: 4.5L/min (Normal 3.4-7.8 L/min) Indexed volumes: LV EDV: 184mL/sq-m (Normal 50-108 mL/sq-m) LV ESV: 73mL/sq-m (Normal 11-47 mL/sq-m) LV SV: 46mL/sq-m (Normal 33-72 mL/sq-m) CI: 2.0L/min/sq-m (Normal 1.8-4.2 L/min/sq-m) Right ventricle: Normal size and systolic function RV EF:  54% (Normal 51-80%) Absolute volumes: RV EDV: (Normal 109-217 mL) RV ESV: 57mL (Normal 23-91 mL) RV SV: 66mL (Normal 71-141 mL) CO: 4.6L/min (Normal 2.8-8.8 L/min) Indexed volumes: RV EDV: 47mL/sq-m (Normal 58-109 mL/sq-m) RV ESV: 65mL/sq-m (Normal 12-46 mL/sq-m) RV SV: 49mL/sq-m (Normal 38-71 mL/sq-m) CI: 2.0L/min/sq-m (Normal 1.7-4.2 L/min/sq-m) Left atrium: Mild enlargement Right atrium: Normal size Mitral valve: No regurgitation Aortic valve: Trivial regurgitation Tricuspid valve: Mild regurgitation Pulmonic valve: No regurgitation Aorta: Normal proximal ascending aorta Pericardium: Normal IMPRESSION:  1. Subendocardial late gadolinium enhancement consistent with infarct in basal to mid inferior wall and basal to apical lateral wall. LGE is greater than 50% transmural suggesting inferior and  lateral walls are nonviable 2. Mild LV dilatation with severe systolic dysfunction (EF 23%). Thinning/akinesis of basal to mid inferior and lateral walls 3.  Normal RV size and systolic function (EF 54%) Electronically Signed   By: Epifanio Lesches M.D.   On: 10/30/2022 00:14   MR CARDIAC VELOCITY FLOW MAP  Result Date: 10/30/2022 CLINICAL DATA:  27M with CAD s/p CABG, HFrEF (EF<20% on echo) presents with cardiac arrest. Cath shows patent RIMA-LAD and freee LIMA-diagonal, but occluded SVG-OM2 and SVG-LPDA EXAM: CARDIAC MRI TECHNIQUE: The patient was scanned on a 1.5 Tesla Siemens magnet. A dedicated cardiac coil was used. Functional imaging was done using Fiesta sequences. 2,3, and 4 chamber views were done to assess for RWMA's. Modified Simpson's rule using a short axis stack was used to calculate an ejection fraction on a dedicated work [employer], [employer]. The patient received 13 cc of Gadavist. After 10 minutes inversion recovery sequences were used to assess for infiltration and scar tissue. Phase contrast velocity mapping was performed above the aortic and pulmonic valves CONTRAST:  13 cc  of Gadavist FINDINGS: Left ventricle: -Mild dilatation -Severe systolic dysfunction Thinning/akinesis of basal to mid inferior and lateral walls -Elevated ECV (40%) -Subendocardial LGE consistent with infarct in basal to mid inferior wall and basal to apical lateral walls. LGE is greater than 50% transmural suggesting nonviability LV EF: 23% (Normal 49-79%) Absolute volumes: LV EDV: (Normal 95-215 mL) LV ESV: (Normal 25-85 mL) LV SV: 65mL (Normal 61-145 mL) CO: 4.5L/min (Normal 3.4-7.8 L/min) Indexed volumes: LV EDV: 136mL/sq-m (Normal 50-108 mL/sq-m) LV ESV: 48mL/sq-m (Normal 11-47 mL/sq-m) LV SV: 57mL/sq-m (Normal 33-72 mL/sq-m) CI: 2.0L/min/sq-m (Normal 1.8-4.2 L/min/sq-m) Right ventricle: Normal size and systolic function RV EF:  54% (Normal 51-80%) Absolute volumes: RV EDV: (Normal  109-217 mL) RV ESV: 57mL (Normal 23-91 mL) RV SV: 66mL (Normal 71-141 mL) CO: 4.6L/min (Normal 2.8-8.8 L/min) Indexed volumes: RV EDV: 29mL/sq-m (Normal 58-109 mL/sq-m) RV ESV: 47mL/sq-m (Normal 12-46 mL/sq-m) RV SV: 29mL/sq-m (Normal 38-71 mL/sq-m) CI: 2.0L/min/sq-m (Normal 1.7-4.2 L/min/sq-m) Left atrium: Mild enlargement Right atrium: Normal size Mitral valve: No regurgitation Aortic valve: Trivial regurgitation Tricuspid valve: Mild regurgitation Pulmonic valve: No regurgitation Aorta: Normal proximal ascending aorta Pericardium: Normal IMPRESSION: 1. Subendocardial late gadolinium enhancement consistent with infarct in basal to mid inferior wall and basal to apical lateral wall. LGE is greater than 50% transmural suggesting inferior and lateral walls are nonviable 2. Mild LV dilatation with severe systolic dysfunction (EF 23%). Thinning/akinesis of basal to mid inferior and lateral walls 3.  Normal RV size and systolic function (EF 54%) Electronically Signed   By: Epifanio Lesches M.D.   On: 10/30/2022 00:14   MR CARDIAC VELOCITY FLOW MAP  Result Date: 10/30/2022 CLINICAL DATA:  27M with CAD s/p CABG, HFrEF (EF<20% on echo) presents with cardiac arrest. Cath shows patent RIMA-LAD and freee LIMA-diagonal, but occluded SVG-OM2 and SVG-LPDA EXAM: CARDIAC MRI TECHNIQUE: The patient was scanned on a 1.5 Tesla Siemens magnet. A dedicated cardiac coil was used. Functional imaging was done using Fiesta sequences. 2,3, and 4 chamber views were done to assess for RWMA's. Modified Simpson's rule using a short axis stack was used to calculate an ejection fraction on a dedicated work [employer], [employer]. The patient received 13 cc of Gadavist. After 10 minutes  inversion recovery sequences were used to assess for infiltration and scar tissue. Phase contrast velocity mapping was performed above the aortic and pulmonic valves CONTRAST:  13 cc  of Gadavist FINDINGS: Left ventricle: -Mild dilatation -Severe  systolic dysfunction Thinning/akinesis of basal to mid inferior and lateral walls -Elevated ECV (40%) -Subendocardial LGE consistent with infarct in basal to mid inferior wall and basal to apical lateral walls. LGE is greater than 50% transmural suggesting nonviability LV EF: 23% (Normal 49-79%) Absolute volumes: LV EDV: (Normal 95-215 mL) LV ESV: (Normal 25-85 mL) LV SV: 65mL (Normal 61-145 mL) CO: 4.5L/min (Normal 3.4-7.8 L/min) Indexed volumes: LV EDV: 1109mL/sq-m (Normal 50-108 mL/sq-m) LV ESV: 23mL/sq-m (Normal 11-47 mL/sq-m) LV SV: 52mL/sq-m (Normal 33-72 mL/sq-m) CI: 2.0L/min/sq-m (Normal 1.8-4.2 L/min/sq-m) Right ventricle: Normal size and systolic function RV EF:  54% (Normal 51-80%) Absolute volumes: RV EDV: (Normal 109-217 mL) RV ESV: 57mL (Normal 23-91 mL) RV SV: 66mL (Normal 71-141 mL) CO: 4.6L/min (Normal 2.8-8.8 L/min) Indexed volumes: RV EDV: 21mL/sq-m (Normal 58-109 mL/sq-m) RV ESV: 40mL/sq-m (Normal 12-46 mL/sq-m) RV SV: 26mL/sq-m (Normal 38-71 mL/sq-m) CI: 2.0L/min/sq-m (Normal 1.7-4.2 L/min/sq-m) Left atrium: Mild enlargement Right atrium: Normal size Mitral valve: No regurgitation Aortic valve: Trivial regurgitation Tricuspid valve: Mild regurgitation Pulmonic valve: No regurgitation Aorta: Normal proximal ascending aorta Pericardium: Normal IMPRESSION: 1. Subendocardial late gadolinium enhancement consistent with infarct in basal to mid inferior wall and basal to apical lateral wall. LGE is greater than 50% transmural suggesting inferior and lateral walls are nonviable 2. Mild LV dilatation with severe systolic dysfunction (EF 23%). Thinning/akinesis of basal to mid inferior and lateral walls 3.  Normal RV size and systolic function (EF 54%) Electronically Signed   By: Epifanio Lesches M.D.   On: 10/30/2022 00:14   DG Chest Port 1 View  Result Date: 10/28/2022 CLINICAL DATA:  Cardiac arrest EXAM: PORTABLE CHEST 1 VIEW COMPARISON:  CXR 02/19/04 FINDINGS: Right-sided  central venous catheter with tip in the upper SVC. Status post median sternotomy. No pleural effusion. No pneumothorax. No focal airspace opacity. Normal cardiac and mediastinal contours. No radiographically apparent displaced rib fractures. Visualized upper abdomen is unremarkable. Vertebral body heights are maintained. IMPRESSION: Right-sided central venous catheter with tip in the upper SVC. No pneumothorax Electronically Signed   By: Lorenza Cambridge M.D.   On: 10/28/2022 13:21   ECHOCARDIOGRAM COMPLETE  Result Date: 10/28/2022    ECHOCARDIOGRAM REPORT   Patient Name:   [name] Date of Exam: 10/28/2022 Medical Rec #:  [id number]      Height:       72.0 in Accession #:    [id number]     Weight:       215.0 lb Date of Birth:  1956/01/04       BSA:          2.197 m Patient Age:    67 years       BP:           104/52 mmHg Patient Gender: M              HR:           73 bpm. Exam Location:  Inpatient Procedure: 2D Echo, Color Doppler, Cardiac Doppler and Intracardiac            Opacification Agent Indications:    Cardiac arrest I46.9  History:        Patient has no prior history of Echocardiogram examinations.  CHF, CAD, Prior CABG, TIA and Carotid Disease, Aortic Valve                 Disease; Risk Factors:Former Smoker, Hypertension, Dyslipidemia,                 Sleep Apnea and Diabetes.  Sonographer:    Aron Baba Referring Phys: [id number] Lorin Glass  Sonographer Comments: Echo performed with patient supine and on artificial respirator and Technically difficult study due to poor echo windows. Image acquisition challenging due to patient body habitus and Image acquisition challenging due to respiratory  motion. IMPRESSIONS  1. Left ventricular ejection fraction, by estimation, is <20%. The left ventricle has severely decreased function. The left ventricle demonstrates global hypokinesis. The left ventricular internal cavity size was severely dilated. Left ventricular diastolic parameters  are consistent with Grade II diastolic dysfunction (pseudonormalization). Elevated left ventricular end-diastolic pressure.  2. Right ventricular systolic function is moderately reduced. The right ventricular size is normal. There is normal pulmonary artery systolic pressure. The estimated right ventricular systolic pressure is 22.2 mmHg.  3. Left atrial size was mildly dilated.  4. The mitral valve is normal in structure. Trivial mitral valve regurgitation. No evidence of mitral stenosis.  5. The aortic valve was not well visualized. Aortic valve regurgitation is not visualized. Aortic valve sclerosis/calcification is present, without any evidence of aortic stenosis.  6. The inferior vena cava is dilated in size with <50% respiratory variability, suggesting right atrial pressure of 15 mmHg. FINDINGS  Left Ventricle: Left ventricular ejection fraction, by estimation, is <20%. The left ventricle has severely decreased function. The left ventricle demonstrates global hypokinesis. Definity contrast agent was given IV to delineate the left ventricular endocardial borders. The left ventricular internal cavity size was severely dilated. There is no left ventricular hypertrophy. Left ventricular diastolic parameters are consistent with Grade II diastolic dysfunction (pseudonormalization). Elevated left ventricular end-diastolic pressure. Right Ventricle: The right ventricular size is normal. No increase in right ventricular wall thickness. Right ventricular systolic function is moderately reduced. There is normal pulmonary artery systolic pressure. The tricuspid regurgitant velocity is 1.34 m/s, and with an assumed right atrial pressure of 15 mmHg, the estimated right ventricular systolic pressure is 22.2 mmHg. Left Atrium: Left atrial size was mildly dilated. Right Atrium: Right atrial size was normal in size. Pericardium: There is no evidence of pericardial effusion. Mitral Valve: The mitral valve is normal in structure.  Trivial mitral valve regurgitation. No evidence of mitral valve stenosis. Tricuspid Valve: The tricuspid valve is normal in structure. Tricuspid valve regurgitation is trivial. No evidence of tricuspid stenosis. Aortic Valve: The aortic valve was not well visualized. Aortic valve regurgitation is not visualized. Aortic valve sclerosis/calcification is present, without any evidence of aortic stenosis. Pulmonic Valve: The pulmonic valve was normal in structure. Pulmonic valve regurgitation is not visualized. No evidence of pulmonic stenosis. Aorta: The aortic root is normal in size and structure. Venous: The inferior vena cava is dilated in size with less than 50% respiratory variability, suggesting right atrial pressure of 15 mmHg. IAS/Shunts: No atrial level shunt detected by color flow Doppler.  LEFT VENTRICLE PLAX 2D LVIDd:         6.20 cm   Diastology LVIDs:         5.80 cm   LV e' medial:    3.81 cm/s LV PW:         1.00 cm   LV E/e' medial:  19.2 LV IVS:  0.80 cm   LV e' lateral:   5.44 cm/s LVOT diam:     2.20 cm   LV E/e' lateral: 13.5 LV SV:         39 LV SV Index:   18 LVOT Area:     3.80 cm  RIGHT VENTRICLE RV S prime:     9.46 cm/s TAPSE (M-mode): 1.2 cm LEFT ATRIUM             Index        RIGHT ATRIUM           Index LA diam:        5.00 cm 2.28 cm/m   RA Area:     22.20 cm LA Vol (A2C):   80.6 ml 36.69 ml/m  RA Volume:   64.40 ml  29.31 ml/m LA Vol (A4C):   69.4 ml 31.59 ml/m LA Biplane Vol: 77.0 ml 35.05 ml/m  AORTIC VALVE LVOT Vmax:   53.80 cm/s LVOT Vmean:  38.200 cm/s LVOT VTI:    0.103 m  AORTA Ao Root diam: 3.40 cm MITRAL VALVE               TRICUSPID VALVE MV Area (PHT): 4.80 cm    TR Peak grad:   7.2 mmHg MV Decel Time: 158 msec    TR Vmax:        134.00 cm/s MV E velocity: 73.30 cm/s MV A velocity: 82.90 cm/s  SHUNTS MV E/A ratio:  0.88        Systemic VTI:  0.10 m                            Systemic Diam: 2.20 cm Armanda Magic MD Electronically signed by Armanda Magic MD Signature  Date/Time: 10/28/2022/1:05:01 PM    Final    PERIPHERAL VASCULAR CATHETERIZATION  Result Date: 10/28/2022 See surgical note for result.  HYBRID OR IMAGING ([facility] ONLY)  Result Date: 10/28/2022 There is no interpretation for this exam.  This order is for images obtained during a surgical procedure.  Please See "Surgeries" Tab for more information regarding the procedure.    ZOX:WRUEA bradycardia at 55 bpm, IVCD at 120 ms(personally reviewed)  TELEMETRY: NSR 60-70s, occasionally PVCs (personally reviewed)  Assessment/Plan: Cardiac arrest - Presented for planned right transcarotid artery revascularization on 4/25, had cardiac arrest shortly after procedure started.  - Initial rhythm was pulseless VT. Chest compressions were started and patient was shocked x 1 with quick return of ROSC.  - Subsequently had second episode of VT but ROSC was obtained after brief ACLS   Pt has longstanding HF/ICM and is now s/p VT arrest. He was previously offered ICD for primary prevention in 2015, now, meets criteria for secondary prevention..   Explained risks, benefits, and alternatives to ICD implantation, including but not limited to bleeding, infection, pneumothorax, pericardial effusion, lead dislodgement, heart attack, stroke, or death.  Pt verbalized understanding and agrees to proceed.  Acute on chronic HFrEF EF this admision < 20%, severe LV diltation, grade 2 DD, Mod reduced RV function RHC/LHC: ostial LCX 99% and distal occluded, RCA mid occlusion, LIMA-diag patent, RIMA-mid LAD patent, SVG-Om2 occlude,d SVG-LPDA occluded. No PCI targets Mean PA 27, PCWP 15, CI 2.12 cMRI  Subendocardial late gadolinium enhancement consistent with infarct in basal to mid inferior wall and basal to apical lateral wall. LGE is greater than 50% transmural suggesting inferior and lateral walls are nonviable. LVEF 23% GDMT per  gen cards  With biventricular CHF, would benefit from HF f/u    PVCs/NSVT Likely scar  mediated Potassium3.1* (04/29 0143) Magnesium  2.0 (04/29 0143) Creatinine, ser  1.10 (04/29 0143) Keep K > 4.0 and Mg > 2.0   For questions or updates, please contact [facility] Please consult [email or web address] for contact info under Cardiology/STEMI.  Dustin Flock, PA-C  11/01/2022 8:43 AM

## 2022-10-31 NOTE — Progress Notes (Addendum)
Rounding Note    Patient Name: [name] Date of Encounter: 10/31/2022  [facility] Cardiologist: Kristeen Miss, MD   Subjective   No complaints  Inpatient Medications    Scheduled Meds:  aspirin  325 mg Per Tube Once   Followed by   aspirin  81 mg Per Tube Daily   carvedilol  3.125 mg Oral BID WC   cholecalciferol  2,000 Units Oral Daily   clopidogrel  75 mg Oral Daily   empagliflozin  10 mg Oral Daily   heparin  5,000 Units Subcutaneous Q8H   insulin aspart  0-9 Units Subcutaneous Q4H   pantoprazole  40 mg Oral Daily   rosuvastatin  10 mg Oral Daily   sacubitril-valsartan  1 tablet Oral BID   sodium chloride flush  3 mL Intravenous Q12H   sodium chloride flush  3 mL Intravenous Q12H   spironolactone  12.5 mg Oral Daily   Continuous Infusions:  sodium chloride Stopped (10/28/22 2252)   sodium chloride     PRN Meds: sodium chloride, sodium chloride, acetaminophen, alum & mag hydroxide-simeth, docusate sodium, guaiFENesin-dextromethorphan, hydrALAZINE, HYDROmorphone (DILAUDID) injection, labetalol, metoprolol tartrate, ondansetron, mouth rinse, phenol, polyethylene glycol, sodium chloride flush   Vital Signs    Vitals:   10/30/22 1719 10/30/22 2000 10/31/22 0000 10/31/22 0600  BP: (!) 114/54 113/65 114/71   Pulse: 71 72 69   Resp: 19 18 18    Temp: 98.9 F (37.2 C) 97.8 F (36.6 C) 97.8 F (36.6 C)   TempSrc: Oral Oral Oral   SpO2: 97% 98% 99%   Weight:    98.2 kg  Height:       No intake or output data in the 24 hours ending 10/31/22 0836    10/31/2022    6:00 AM 10/29/2022    4:11 AM 10/28/2022    7:28 AM  Last 3 Weights  Weight (lbs) 216 lb 8 oz 223 lb 15.8 oz 215 lb  Weight (kg) 98.204 kg 101.6 kg 97.523 kg      Telemetry    SR, PVCs and bigeminy. Isolated 19 beat run NSVT - Personally Reviewed  ECG    N/a - Personally Reviewed  Physical Exam   GEN: No acute distress.   Neck: No JVD Cardiac: RRR, no murmurs, rubs, or  gallops.  Respiratory: Clear to auscultation bilaterally. GI: Soft, nontender, non-distended  MS: No edema; No deformity. Neuro:  Nonfocal  Psych: Normal affect   Labs    High Sensitivity Troponin:   Recent Labs  Lab 10/28/22 1150 10/28/22 1642 10/28/22 2347 10/29/22 0410  TROPONINIHS 73* 261* 294* 222*     Chemistry Recent Labs  Lab 10/29/22 0410 10/29/22 1303 10/29/22 1306 10/30/22 0122 10/31/22 0114  NA 138   < > 140 136 137  K 3.9   < > 3.9 3.8 4.1  CL 108  --   --  104 103  CO2 22  --   --  24 25  GLUCOSE 128*  --   --  120* 123*  BUN 11  --   --  12 16  CREATININE 0.85  --   --  0.97 1.07  CALCIUM 8.8*  --   --  8.9 9.1  MG 1.9  --   --  1.8 1.8  PROT 6.2*  --   --  6.4* 6.6  ALBUMIN 3.3*  --   --  3.4* 3.5  AST 23  --   --  19 17  ALT 33  --   --  26 23  ALKPHOS 39  --   --  46 48  BILITOT 1.2  --   --  1.0 1.1  GFRNONAA >60  --   --  >60 >60  ANIONGAP 8  --   --  8 9   < > = values in this interval not displayed.    Lipids No results for input(s): "CHOL", "TRIG", "HDL", "LABVLDL", "LDLCALC", "CHOLHDL" in the last 168 hours.  Hematology Recent Labs  Lab 10/29/22 0410 10/29/22 1303 10/29/22 1306 10/30/22 0122 10/31/22 0114  WBC 9.8  --   --  8.8 6.3  RBC 4.33  --   --  4.59 4.48  HGB 14.4   < > 13.6 14.7 14.4  HCT 41.4   < > 40.0 44.8 43.4  MCV 95.6  --   --  97.6 96.9  MCH 33.3  --   --  32.0 32.1  MCHC 34.8  --   --  32.8 33.2  RDW 12.5  --   --  12.5 12.4  PLT 133*  --   --  125* 131*   < > = values in this interval not displayed.   Thyroid No results for input(s): "TSH", "FREET4" in the last 168 hours.  BNPNo results for input(s): "BNP", "PROBNP" in the last 168 hours.  DDimer No results for input(s): "DDIMER" in the last 168 hours.   Radiology    MR CARDIAC MORPHOLOGY W WO CONTRAST  Result Date: 10/30/2022 CLINICAL DATA:  67M with CAD s/p CABG, HFrEF (EF<20% on echo) presents with cardiac arrest. Cath shows patent RIMA-LAD and freee  LIMA-diagonal, but occluded SVG-OM2 and SVG-LPDA EXAM: CARDIAC MRI TECHNIQUE: The patient was scanned on a 1.5 Tesla Siemens magnet. A dedicated cardiac coil was used. Functional imaging was done using Fiesta sequences. 2,3, and 4 chamber views were done to assess for RWMA's. Modified Simpson's rule using a short axis stack was used to calculate an ejection fraction on a dedicated work [employer]. The patient received 13 cc of Gadavist. After 10 minutes inversion recovery sequences were used to assess for infiltration and scar tissue. Phase contrast velocity mapping was performed above the aortic and pulmonic valves CONTRAST:  13 cc  of Gadavist FINDINGS: Left ventricle: -Mild dilatation -Severe systolic dysfunction Thinning/akinesis of basal to mid inferior and lateral walls -Elevated ECV (40%) -Subendocardial LGE consistent with infarct in basal to mid inferior wall and basal to apical lateral walls. LGE is greater than 50% transmural suggesting nonviability LV EF: 23% (Normal 49-79%) Absolute volumes: LV EDV: (Normal 95-215 mL) LV ESV: (Normal 25-85 mL) LV SV: 65mL (Normal 61-145 mL) CO: 4.5L/min (Normal 3.4-7.8 L/min) Indexed volumes: LV EDV: 125mL/sq-m (Normal 50-108 mL/sq-m) LV ESV: 42mL/sq-m (Normal 11-47 mL/sq-m) LV SV: 83mL/sq-m (Normal 33-72 mL/sq-m) CI: 2.0L/min/sq-m (Normal 1.8-4.2 L/min/sq-m) Right ventricle: Normal size and systolic function RV EF:  54% (Normal 51-80%) Absolute volumes: RV EDV: (Normal 109-217 mL) RV ESV: 57mL (Normal 23-91 mL) RV SV: 66mL (Normal 71-141 mL) CO: 4.6L/min (Normal 2.8-8.8 L/min) Indexed volumes: RV EDV: 63mL/sq-m (Normal 58-109 mL/sq-m) RV ESV: 96mL/sq-m (Normal 12-46 mL/sq-m) RV SV: 14mL/sq-m (Normal 38-71 mL/sq-m) CI: 2.0L/min/sq-m (Normal 1.7-4.2 L/min/sq-m) Left atrium: Mild enlargement Right atrium: Normal size Mitral valve: No regurgitation Aortic valve: Trivial regurgitation Tricuspid valve: Mild regurgitation Pulmonic valve:  No regurgitation Aorta: Normal proximal ascending aorta Pericardium: Normal IMPRESSION: 1. Subendocardial late gadolinium enhancement consistent with infarct in basal to  mid inferior wall and basal to apical lateral wall. LGE is greater than 50% transmural suggesting inferior and lateral walls are nonviable 2. Mild LV dilatation with severe systolic dysfunction (EF 23%). Thinning/akinesis of basal to mid inferior and lateral walls 3.  Normal RV size and systolic function (EF 54%) Electronically Signed   By: Epifanio Lesches M.D.   On: 10/30/2022 00:14   MR CARDIAC VELOCITY FLOW MAP  Result Date: 10/30/2022 CLINICAL DATA:  78M with CAD s/p CABG, HFrEF (EF<20% on echo) presents with cardiac arrest. Cath shows patent RIMA-LAD and freee LIMA-diagonal, but occluded SVG-OM2 and SVG-LPDA EXAM: CARDIAC MRI TECHNIQUE: The patient was scanned on a 1.5 Tesla Siemens magnet. A dedicated cardiac coil was used. Functional imaging was done using Fiesta sequences. 2,3, and 4 chamber views were done to assess for RWMA's. Modified Simpson's rule using a short axis stack was used to calculate an ejection fraction on a dedicated work [employer], [employer]. The patient received 13 cc of Gadavist. After 10 minutes inversion recovery sequences were used to assess for infiltration and scar tissue. Phase contrast velocity mapping was performed above the aortic and pulmonic valves CONTRAST:  13 cc  of Gadavist FINDINGS: Left ventricle: -Mild dilatation -Severe systolic dysfunction Thinning/akinesis of basal to mid inferior and lateral walls -Elevated ECV (40%) -Subendocardial LGE consistent with infarct in basal to mid inferior wall and basal to apical lateral walls. LGE is greater than 50% transmural suggesting nonviability LV EF: 23% (Normal 49-79%) Absolute volumes: LV EDV: (Normal 95-215 mL) LV ESV: (Normal 25-85 mL) LV SV: 65mL (Normal 61-145 mL) CO: 4.5L/min (Normal 3.4-7.8 L/min) Indexed volumes: LV EDV:  159mL/sq-m (Normal 50-108 mL/sq-m) LV ESV: 45mL/sq-m (Normal 11-47 mL/sq-m) LV SV: 72mL/sq-m (Normal 33-72 mL/sq-m) CI: 2.0L/min/sq-m (Normal 1.8-4.2 L/min/sq-m) Right ventricle: Normal size and systolic function RV EF:  54% (Normal 51-80%) Absolute volumes: RV EDV: (Normal 109-217 mL) RV ESV: 57mL (Normal 23-91 mL) RV SV: 66mL (Normal 71-141 mL) CO: 4.6L/min (Normal 2.8-8.8 L/min) Indexed volumes: RV EDV: 51mL/sq-m (Normal 58-109 mL/sq-m) RV ESV: 79mL/sq-m (Normal 12-46 mL/sq-m) RV SV: 74mL/sq-m (Normal 38-71 mL/sq-m) CI: 2.0L/min/sq-m (Normal 1.7-4.2 L/min/sq-m) Left atrium: Mild enlargement Right atrium: Normal size Mitral valve: No regurgitation Aortic valve: Trivial regurgitation Tricuspid valve: Mild regurgitation Pulmonic valve: No regurgitation Aorta: Normal proximal ascending aorta Pericardium: Normal IMPRESSION: 1. Subendocardial late gadolinium enhancement consistent with infarct in basal to mid inferior wall and basal to apical lateral wall. LGE is greater than 50% transmural suggesting inferior and lateral walls are nonviable 2. Mild LV dilatation with severe systolic dysfunction (EF 23%). Thinning/akinesis of basal to mid inferior and lateral walls 3.  Normal RV size and systolic function (EF 54%) Electronically Signed   By: Epifanio Lesches M.D.   On: 10/30/2022 00:14   MR CARDIAC VELOCITY FLOW MAP  Result Date: 10/30/2022 CLINICAL DATA:  78M with CAD s/p CABG, HFrEF (EF<20% on echo) presents with cardiac arrest. Cath shows patent RIMA-LAD and freee LIMA-diagonal, but occluded SVG-OM2 and SVG-LPDA EXAM: CARDIAC MRI TECHNIQUE: The patient was scanned on a 1.5 Tesla Siemens magnet. A dedicated cardiac coil was used. Functional imaging was done using Fiesta sequences. 2,3, and 4 chamber views were done to assess for RWMA's. Modified Simpson's rule using a short axis stack was used to calculate an ejection fraction on a dedicated work [employer], [employer]. The patient received 13  cc of Gadavist. After 10 minutes inversion recovery sequences were used to assess for infiltration and scar  tissue. Phase contrast velocity mapping was performed above the aortic and pulmonic valves CONTRAST:  13 cc  of Gadavist FINDINGS: Left ventricle: -Mild dilatation -Severe systolic dysfunction Thinning/akinesis of basal to mid inferior and lateral walls -Elevated ECV (40%) -Subendocardial LGE consistent with infarct in basal to mid inferior wall and basal to apical lateral walls. LGE is greater than 50% transmural suggesting nonviability LV EF: 23% (Normal 49-79%) Absolute volumes: LV EDV: (Normal 95-215 mL) LV ESV: (Normal 25-85 mL) LV SV: 65mL (Normal 61-145 mL) CO: 4.5L/min (Normal 3.4-7.8 L/min) Indexed volumes: LV EDV: 149mL/sq-m (Normal 50-108 mL/sq-m) LV ESV: 74mL/sq-m (Normal 11-47 mL/sq-m) LV SV: 75mL/sq-m (Normal 33-72 mL/sq-m) CI: 2.0L/min/sq-m (Normal 1.8-4.2 L/min/sq-m) Right ventricle: Normal size and systolic function RV EF:  54% (Normal 51-80%) Absolute volumes: RV EDV: (Normal 109-217 mL) RV ESV: 57mL (Normal 23-91 mL) RV SV: 66mL (Normal 71-141 mL) CO: 4.6L/min (Normal 2.8-8.8 L/min) Indexed volumes: RV EDV: 14mL/sq-m (Normal 58-109 mL/sq-m) RV ESV: 38mL/sq-m (Normal 12-46 mL/sq-m) RV SV: 60mL/sq-m (Normal 38-71 mL/sq-m) CI: 2.0L/min/sq-m (Normal 1.7-4.2 L/min/sq-m) Left atrium: Mild enlargement Right atrium: Normal size Mitral valve: No regurgitation Aortic valve: Trivial regurgitation Tricuspid valve: Mild regurgitation Pulmonic valve: No regurgitation Aorta: Normal proximal ascending aorta Pericardium: Normal IMPRESSION: 1. Subendocardial late gadolinium enhancement consistent with infarct in basal to mid inferior wall and basal to apical lateral wall. LGE is greater than 50% transmural suggesting inferior and lateral walls are nonviable 2. Mild LV dilatation with severe systolic dysfunction (EF 23%). Thinning/akinesis of basal to mid inferior and lateral walls 3.  Normal  RV size and systolic function (EF 54%) Electronically Signed   By: Epifanio Lesches M.D.   On: 10/30/2022 00:14   CARDIAC CATHETERIZATION  Result Date: 10/29/2022 1.  Severe native vessel coronary artery disease with total occlusion of the proximal LAD and subtotal occlusion of the proximal and mid circumflex, total occlusion of the distal circumflex, and total occlusion of the midportion of the nondominant RCA 2.  Status post aortocoronary bypass surgery with continued patency of the RIMA to LAD and free LIMA to diagonal.  Total occlusion of the saphenous vein graft to OM 2 and saphenous vein graft to left PDA 3.  Preserved cardiac output of 4.75, cardiac index 2.12, mild pulmonary hypertension with a mean PA pressure of 27 mmHg and transpulmonary gradient of 12 mmHg, PVR of 2.5 Wood units Recommend ongoing medical therapy for CAD and ischemic cardiomyopathy, no targets for PCI    Cardiac Studies     Patient Profile     67 y.o. male with a history of CAD s/p CABG x4 in 01/2002, chronic HFrEF with EF of 30-35%, carotid artery disease s/p right CEA and patch angioplasty in 11/2016, PAD s/p right iliofemoral artery endarterectomy in May and patch angioplasty as well as stenting of bilateral common iliac arteries in 04/2021, hypertension, hyperlipidemia, type 2 diabetes mellitus, and TIA who is being seen for the evaluation of VT arrest   Assessment & Plan    1.Cardiac arrest - Presented for planned right transcarotid artery revascularization on 4/25, had cardiac arrest shortly after procedure started.  - Initial rhythm was pulseless VT. Chest compressions were started and patient was shocked x 1 with quick return of ROSC.  - Subsequently had second episode of VT but ROSC was obtained after brief ACLS    - Echo showed EF less than 20%, severe LV dilatation, grade 2 diastolic dysfunction, moderately reduced RV function, no significant valvular disease  - RHC/LHC:  ostial LCX 99% and distal  occluded, RCA mid occlusion, LIMA-diag patent, RIMA-mid LAD patent, SVG-Om2 occlude,d SVG-LPDA occluded. No PCI targets - mean PA 27, PCWP 15, CI 2.12 -cMRI  Subendocardial late gadolinium enhancement consistent with infarct in basal to mid inferior wall and basal to apical lateral wall. LGE is greater than 50% transmural suggesting inferior and lateral walls are nonviable. LVEF 23% - EP to see to consider ICD for secondary prevention on Monday, disucssed with team but would touch base again on Monday.  -EP has already placed for liquid thin diet Monday in case procedure needed     2.Acute on chronic HFrEF - 2015 echo LVEF 30-35%  Echo this admit showed EF less than 20%, severe LV dilatation, grade 2 diastolic dysfunction, moderately reduced RV function, no significant valvular disease  - RHC/LHC: ostial LCX 99% and distal occluded, RCA mid occlusion, LIMA-diag patent, RIMA-mid LAD patent, SVG-Om2 occlude,d SVG-LPDA occluded. No PCI targets - mean PA 27, PCWP 15, CI 2.12 -cMRI  Subendocardial late gadolinium enhancement consistent with infarct in basal to mid inferior wall and basal to apical lateral wall. LGE is greater than 50% transmural suggesting inferior and lateral walls are nonviable. LVEF 23%   - meds held initialy due to low bp's - now back on coreg 3.125mg  bid, jardiance 10mg , crestor 10, entresto 24/26mg  bid, aldactone 12.5mg .  - bp's tolerating regimen, lower dosing than home regimen. Can work to titrate back to home doses over time bp permitting.   3. PVCs/NSVT - likely scar mediated - isolated 19 beat run NSVT. K 4.1, Mg 1.8. Replete magnesium - increase coreg to 6.25mg  bid.    For questions or updates, please contact [facility] Please consult [email or web address] for contact info under        Signed, Dina Rich, MD  10/31/2022, 8:36 AM

## 2022-11-01 ENCOUNTER — Encounter (HOSPITAL_COMMUNITY): Payer: Self-pay | Admitting: Cardiovascular Disease

## 2022-11-01 ENCOUNTER — Other Ambulatory Visit (HOSPITAL_COMMUNITY): Payer: Self-pay

## 2022-11-01 DIAGNOSIS — I2583 Coronary atherosclerosis due to lipid rich plaque: Secondary | ICD-10-CM

## 2022-11-01 DIAGNOSIS — I469 Cardiac arrest, cause unspecified: Secondary | ICD-10-CM | POA: Diagnosis not present

## 2022-11-01 DIAGNOSIS — I255 Ischemic cardiomyopathy: Secondary | ICD-10-CM

## 2022-11-01 DIAGNOSIS — I472 Ventricular tachycardia, unspecified: Secondary | ICD-10-CM | POA: Diagnosis not present

## 2022-11-01 DIAGNOSIS — I2510 Atherosclerotic heart disease of native coronary artery without angina pectoris: Secondary | ICD-10-CM

## 2022-11-01 DIAGNOSIS — I5042 Chronic combined systolic (congestive) and diastolic (congestive) heart failure: Secondary | ICD-10-CM

## 2022-11-01 LAB — CBC
HCT: 41.4 % (ref 39.0–52.0)
Hemoglobin: 14.3 g/dL (ref 13.0–17.0)
MCH: 32.8 pg (ref 26.0–34.0)
MCHC: 34.5 g/dL (ref 30.0–36.0)
MCV: 95 fL (ref 80.0–100.0)
Platelets: 137 10*3/uL — ABNORMAL LOW (ref 150–400)
RBC: 4.36 MIL/uL (ref 4.22–5.81)
RDW: 12.3 % (ref 11.5–15.5)
WBC: 7.6 10*3/uL (ref 4.0–10.5)
nRBC: 0 % (ref 0.0–0.2)

## 2022-11-01 LAB — BASIC METABOLIC PANEL
Anion gap: 11 (ref 5–15)
BUN: 14 mg/dL (ref 8–23)
CO2: 25 mmol/L (ref 22–32)
Calcium: 9.2 mg/dL (ref 8.9–10.3)
Chloride: 101 mmol/L (ref 98–111)
Creatinine, Ser: 0.97 mg/dL (ref 0.61–1.24)
GFR, Estimated: >60 mL/min (ref >60)
Glucose, Bld: 110 mg/dL — ABNORMAL HIGH (ref 70–99)
Potassium: 4 mmol/L (ref 3.5–5.1)
Sodium: 137 mmol/L (ref 135–145)

## 2022-11-01 LAB — GLUCOSE, CAPILLARY
Glucose-Capillary: 147 mg/dL — ABNORMAL HIGH (ref 70–99)
Glucose-Capillary: 123 mg/dL — ABNORMAL HIGH (ref 70–99)
Glucose-Capillary: 114 mg/dL — ABNORMAL HIGH (ref 70–99)
Glucose-Capillary: 117 mg/dL — ABNORMAL HIGH (ref 70–99)
Glucose-Capillary: 133 mg/dL — ABNORMAL HIGH (ref 70–99)
Glucose-Capillary: 115 mg/dL — ABNORMAL HIGH (ref 70–99)

## 2022-11-01 LAB — COMPREHENSIVE METABOLIC PANEL
ALT: 23 U/L (ref 0–44)
AST: 21 U/L (ref 15–41)
Albumin: 3.4 g/dL — ABNORMAL LOW (ref 3.5–5.0)
Alkaline Phosphatase: 47 U/L (ref 38–126)
Anion gap: 9 (ref 5–15)
BUN: 16 mg/dL (ref 8–23)
CO2: 23 mmol/L (ref 22–32)
Calcium: 8.5 mg/dL — ABNORMAL LOW (ref 8.9–10.3)
Chloride: 103 mmol/L (ref 98–111)
Creatinine, Ser: 1.1 mg/dL (ref 0.61–1.24)
GFR, Estimated: >60 mL/min (ref >60)
Glucose, Bld: 149 mg/dL — ABNORMAL HIGH (ref 70–99)
Potassium: 3.1 mmol/L — ABNORMAL LOW (ref 3.5–5.1)
Sodium: 135 mmol/L (ref 135–145)
Total Bilirubin: 0.8 mg/dL (ref 0.3–1.2)
Total Protein: 6.4 g/dL — ABNORMAL LOW (ref 6.5–8.1)

## 2022-11-01 LAB — MAGNESIUM: Magnesium: 2 mg/dL (ref 1.7–2.4)

## 2022-11-01 LAB — PHOSPHORUS: Phosphorus: 4.5 mg/dL (ref 2.5–4.6)

## 2022-11-01 MED ORDER — SODIUM CHLORIDE 0.9 % IV SOLN: INTRAVENOUS | Status: DC

## 2022-11-01 MED ORDER — POTASSIUM CHLORIDE CRYS ER 20 MEQ PO TBCR
40.0000 meq | EXTENDED_RELEASE_TABLET | Freq: Once | ORAL | Status: AC
  Administered 2022-11-01: 40 meq via ORAL
  Filled 2022-11-01: qty 2

## 2022-11-01 MED FILL — Heparin Sodium (Porcine) Inj 1000 Unit/ML: INTRAMUSCULAR | Qty: 10 | Status: AC

## 2022-11-01 NOTE — Care Management Important Message (Signed)
Important Message  Patient Details  Name: [name] MRN: [id number] Date of Birth: 02-06-1956   Medicare Important Message Given:  Yes     Renie Ora 11/01/2022, 11:12 AM

## 2022-11-01 NOTE — Progress Notes (Signed)
Rounding Note    Patient Name: [name] Date of Encounter: 11/01/2022  [facility] Cardiologist: Kristeen Miss, MD   Subjective   No chest pain  Inpatient Medications    Scheduled Meds:  aspirin  325 mg Per Tube Once   Followed by   aspirin  81 mg Per Tube Daily   carvedilol  6.25 mg Oral BID WC   cholecalciferol  2,000 Units Oral Daily   empagliflozin  10 mg Oral Daily   insulin aspart  0-9 Units Subcutaneous Q4H   pantoprazole  40 mg Oral Daily   rosuvastatin  10 mg Oral Daily   sacubitril-valsartan  1 tablet Oral BID   sodium chloride flush  3 mL Intravenous Q12H   sodium chloride flush  3 mL Intravenous Q12H   spironolactone  12.5 mg Oral Daily   Continuous Infusions:  sodium chloride Stopped (10/28/22 2252)   sodium chloride     PRN Meds: sodium chloride, sodium chloride, acetaminophen, alum & mag hydroxide-simeth, docusate sodium, guaiFENesin-dextromethorphan, hydrALAZINE, HYDROmorphone (DILAUDID) injection, labetalol, metoprolol tartrate, mouth rinse, phenol, polyethylene glycol, sodium chloride flush   Vital Signs    Vitals:   10/31/22 2333 11/01/22 0357 11/01/22 0436 11/01/22 0805  BP: (!) 93/52 124/73  130/68  Pulse: 80 80  80  Resp: 20 20  18   Temp: 97.8 F (36.6 C) 97.7 F (36.5 C)  98.1 F (36.7 C)  TempSrc: Oral Oral  Oral  SpO2: 98% 98%  100%  Weight:   98.7 kg   Height:        Intake/Output Summary (Last 24 hours) at 11/01/2022 1009 Last data filed at 11/01/2022 0800 Gross per 24 hour  Intake 0 ml  Output 0 ml  Net 0 ml      11/01/2022    4:36 AM 10/31/2022    6:00 AM 10/29/2022    4:11 AM  Last 3 Weights  Weight (lbs) 217 lb 11.2 oz 216 lb 8 oz 223 lb 15.8 oz  Weight (kg) 98.748 kg 98.204 kg 101.6 kg      Telemetry    SR with freq PVCs short bursts on NSVT  - Personally Reviewed  ECG    No new - Personally Reviewed  Physical Exam  Exam per Dr. Mayford Knife GEN: No acute distress.   Neck: No JVD Cardiac: RRR,  no murmurs, rubs, or gallops.  Respiratory: Clear to auscultation bilaterally. GI: Soft, nontender, non-distended  MS: No edema; No deformity. Neuro:  Nonfocal  Psych: Normal affect   Labs    High Sensitivity Troponin:   Recent Labs  Lab 10/28/22 1150 10/28/22 1642 10/28/22 2347 10/29/22 0410  TROPONINIHS 73* 261* 294* 222*     Chemistry Recent Labs  Lab 10/30/22 0122 10/31/22 0114 11/01/22 0143  NA 136 137 135  K 3.8 4.1 3.1*  CL 104 103 103  CO2 24 25 23   GLUCOSE 120* 123* 149*  BUN 12 16 16   CREATININE 0.97 1.07 1.10  CALCIUM 8.9 9.1 8.5*  MG 1.8 1.8 2.0  PROT 6.4* 6.6 6.4*  ALBUMIN 3.4* 3.5 3.4*  AST 19 17 21   ALT 26 23 23   ALKPHOS 46 48 47  BILITOT 1.0 1.1 0.8  GFRNONAA >60 >60 >60  ANIONGAP 8 9 9     Lipids No results for input(s): "CHOL", "TRIG", "HDL", "LABVLDL", "LDLCALC", "CHOLHDL" in the last 168 hours.  Hematology Recent Labs  Lab 10/30/22 0122 10/31/22 0114 11/01/22 0143  WBC 8.8 6.3 7.6  RBC  4.59 4.48 4.36  HGB 14.7 14.4 14.3  HCT 44.8 43.4 41.4  MCV 97.6 96.9 95.0  MCH 32.0 32.1 32.8  MCHC 32.8 33.2 34.5  RDW 12.5 12.4 12.3  PLT 125* 131* 137*   Thyroid No results for input(s): "TSH", "FREET4" in the last 168 hours.  BNPNo results for input(s): "BNP", "PROBNP" in the last 168 hours.  DDimer No results for input(s): "DDIMER" in the last 168 hours.   Radiology    No results found.  Cardiac Studies   Cardiac cath 10/29/22  Diagnostic Dominance: Left   1.  Severe native vessel coronary artery disease with total occlusion of the proximal LAD and subtotal occlusion of the proximal and mid circumflex, total occlusion of the distal circumflex, and total occlusion of the midportion of the nondominant RCA 2.  Status post aortocoronary bypass surgery with continued patency of the RIMA to LAD and free LIMA to diagonal.  Total occlusion of the saphenous vein graft to OM 2 and saphenous vein graft to left PDA 3.  Preserved cardiac output of  4.75, cardiac index 2.12, mild pulmonary hypertension with a mean PA pressure of 27 mmHg and transpulmonary gradient of 12 mmHg, PVR of 2.5 Wood units   Recommend ongoing medical therapy for CAD and ischemic cardiomyopathy, no targets for PCI   Echo 10/28/22 IMPRESSIONS     1. Left ventricular ejection fraction, by estimation, is <20%. The left  ventricle has severely decreased function. The left ventricle demonstrates  global hypokinesis. The left ventricular internal cavity size was severely  dilated. Left ventricular  diastolic parameters are consistent with Grade II diastolic dysfunction  (pseudonormalization). Elevated left ventricular end-diastolic pressure.   2. Right ventricular systolic function is moderately reduced. The right  ventricular size is normal. There is normal pulmonary artery systolic  pressure. The estimated right ventricular systolic pressure is 22.2 mmHg.   3. Left atrial size was mildly dilated.   4. The mitral valve is normal in structure. Trivial mitral valve  regurgitation. No evidence of mitral stenosis.   5. The aortic valve was not well visualized. Aortic valve regurgitation  is not visualized. Aortic valve sclerosis/calcification is present,  without any evidence of aortic stenosis.   6. The inferior vena cava is dilated in size with <50% respiratory  variability, suggesting right atrial pressure of 15 mmHg.   FINDINGS   Left Ventricle: Left ventricular ejection fraction, by estimation, is  <20%. The left ventricle has severely decreased function. The left  ventricle demonstrates global hypokinesis. Definity contrast agent was  given IV to delineate the left ventricular  endocardial borders. The left ventricular internal cavity size was  severely dilated. There is no left ventricular hypertrophy. Left  ventricular diastolic parameters are consistent with Grade II diastolic  dysfunction (pseudonormalization). Elevated left  ventricular end-diastolic  pressure.   Right Ventricle: The right ventricular size is normal. No increase in  right ventricular wall thickness. Right ventricular systolic function is  moderately reduced. There is normal pulmonary artery systolic pressure.  The tricuspid regurgitant velocity is  1.34 m/s, and with an assumed right atrial pressure of 15 mmHg, the  estimated right ventricular systolic pressure is 22.2 mmHg.   Left Atrium: Left atrial size was mildly dilated.   Right Atrium: Right atrial size was normal in size.   Pericardium: There is no evidence of pericardial effusion.   Mitral Valve: The mitral valve is normal in structure. Trivial mitral  valve regurgitation. No evidence of mitral valve stenosis.  Tricuspid Valve: The tricuspid valve is normal in structure. Tricuspid  valve regurgitation is trivial. No evidence of tricuspid stenosis.   Aortic Valve: The aortic valve was not well visualized. Aortic valve  regurgitation is not visualized. Aortic valve sclerosis/calcification is  present, without any evidence of aortic stenosis.   Pulmonic Valve: The pulmonic valve was normal in structure. Pulmonic valve  regurgitation is not visualized. No evidence of pulmonic stenosis.   Aorta: The aortic root is normal in size and structure.   Venous: The inferior vena cava is dilated in size with less than 50%  respiratory variability, suggesting right atrial pressure of 15 mmHg.   IAS/Shunts: No atrial level shunt detected by color flow Doppler.   MR -cardiac 10/29/22  EXAM: CARDIAC MRI   TECHNIQUE: The patient was scanned on a 1.5 Tesla Siemens magnet. A dedicated cardiac coil was used. Functional imaging was done using Fiesta sequences. 2,3, and 4 chamber views were done to assess for RWMA's. Modified Simpson's rule using a short axis stack was used to calculate an ejection fraction on a dedicated work [employer] officer, [employer]. The patient received 13 cc of Gadavist. After 10 minutes  inversion recovery sequences were used to assess for infiltration and scar tissue. Phase contrast velocity mapping was performed above the aortic and pulmonic valves   CONTRAST:  13 cc  of Gadavist   FINDINGS: Left ventricle:   -Mild dilatation   -Severe systolic dysfunction Thinning/akinesis of basal to mid inferior and lateral walls   -Elevated ECV (40%)   -Subendocardial LGE consistent with infarct in basal to mid inferior wall and basal to apical lateral walls. LGE is greater than 50% transmural suggesting nonviability   LV EF: 23% (Normal 49-79%)   Absolute volumes:   LV EDV: (Normal 95-215 mL)   LV ESV: (Normal 25-85 mL)   LV SV: 65mL (Normal 61-145 mL)   CO: 4.5L/min (Normal 3.4-7.8 L/min)  Patient Profile     67 y.o. male with a history of CAD s/p CABG x4 in 01/2002, chronic HFrEF with EF of 30-35%, carotid artery disease s/p right CEA and patch angioplasty in 11/2016, PAD s/p right iliofemoral artery endarterectomy in May and patch angioplasty as well as stenting of bilateral common iliac arteries in 04/2021, hypertension, hyperlipidemia, type 2 diabetes mellitus, and TIA now admitted with cardiac arrest 10/28/22.   Assessment & Plan    Cardiac Arrest  --- Presented for planned right transcarotid artery revascularization on 4/25, had cardiac arrest shortly after procedure started.  - Initial rhythm was pulseless VT. Chest compressions were started and patient was shocked x 1 with quick return of ROSC.  - Subsequently had second episode of VT but ROSC was obtained after brief ACLS    - Echo showed EF less than 20%, severe LV dilatation, grade 2 diastolic dysfunction, moderately reduced RV function, no significant valvular disease  - RHC/LHC: ostial LCX 99% and distal occluded, RCA mid occlusion, LIMA-diag patent, RIMA-mid LAD patent, SVG-Om2 occlude,d SVG-LPDA occluded. No PCI targets - mean PA 27, PCWP 15, CI 2.12 -cMRI  Subendocardial late gadolinium  enhancement consistent with infarct in basal to mid inferior wall and basal to apical lateral wall. LGE is greater than 50% transmural suggesting inferior and lateral walls are nonviable. LVEF 23% --EP is seeing    Acute on chronic HFrEF - 2015 echo LVEF 30-35%  Echo this admit showed EF less than 20%, severe LV dilatation, grade 2 diastolic dysfunction, moderately reduced RV function, no  significant valvular disease  - RHC/LHC: ostial LCX 99% and distal occluded, RCA mid occlusion, LIMA-diag patent, RIMA-mid LAD patent, SVG-Om2 occlude,d SVG-LPDA occluded. No PCI targets - mean PA 27, PCWP 15, CI 2.12 -cMRI  Subendocardial late gadolinium enhancement consistent with infarct in basal to mid inferior wall and basal to apical lateral wall. LGE is greater than 50% transmural suggesting inferior and lateral walls are nonviable. LVEF 23%   - meds held initialy due to low bp's - now back on coreg 3.125mg  bid, jardiance 10mg , crestor 10, entresto 24/26mg  bid, aldactone 12.5mg .  - bp's tolerating regimen, lower dosing than home regimen. Can work to titrate back to home doses over time bp permitting.   PVCs/NSVT --likely scar mediated  --today freq PVCs and bursts NSVT  --coreg 6.25 BID   Hypokalemia today will replace --Mg+ WNL 2.0        For questions or updates, please contact [facility] Please consult [email or web address] for contact info under        Signed, Nada Boozer, NP  11/01/2022, 10:09 AM

## 2022-11-01 NOTE — Progress Notes (Addendum)
  Progress Note    11/01/2022 7:41 AM 3 Days Post-Op  Subjective:  no complaints this morning, walking around in room   Vitals:   10/31/22 2333 11/01/22 0357  BP: (!) 93/52 124/73  Pulse: 80 80  Resp: 20 20  Temp: 97.8 F (36.6 C) 97.7 F (36.5 C)  SpO2: 98% 98%   Physical Exam: Cardiac:  regular Lungs:  non labored Extremities:  moving all extremities without deficits Neurologic: alert and oriented  CBC    Component Value Date/Time   WBC 7.6 11/01/2022 0143   RBC 4.36 11/01/2022 0143   HGB 14.3 11/01/2022 0143   HCT 41.4 11/01/2022 0143   PLT 137 (L) 11/01/2022 0143   MCV 95.0 11/01/2022 0143   MCH 32.8 11/01/2022 0143   MCHC 34.5 11/01/2022 0143   RDW 12.3 11/01/2022 0143   LYMPHSABS 1.5 10/28/2022 1150   MONOABS 0.5 10/28/2022 1150   EOSABS 0.2 10/28/2022 1150   BASOSABS 0.0 10/28/2022 1150    BMET    Component Value Date/Time   NA 135 11/01/2022 0143   K 3.1 (L) 11/01/2022 0143   CL 103 11/01/2022 0143   CO2 23 11/01/2022 0143   GLUCOSE 149 (H) 11/01/2022 0143   BUN 16 11/01/2022 0143   CREATININE 1.10 11/01/2022 0143   CALCIUM 8.5 (L) 11/01/2022 0143   GFRNONAA >60 11/01/2022 0143   GFRAA >60 05/22/2018 1049    INR    Component Value Date/Time   INR 1.1 10/29/2022 1522     Intake/Output Summary (Last 24 hours) at 11/01/2022 0741 Last data filed at 10/31/2022 0850 Gross per 24 hour  Intake 240 ml  Output --  Net 240 ml     Assessment/Plan:  67 y.o. male is s/p aborted right TCAR due to cardiac arrest x 2  Neurologically stable EP to see and evaluate today for ICD Restart Plavix when okay from Cardiology, continue Larina Bras pending EP/ Cardiology recs   Graceann Congress, [facility] [phone] 11/01/2022 7:41 AM  VASCULAR STAFF ADDENDUM: I have independently interviewed and examined the patient. I agree with the above.  Noted plans for ICD with EP - maybe later today. Will re-load with plavix after ICD  is placed. Will tentatively add to OR schedule Wednesday.   Rande Brunt. Lenell Antu, MD [facility] Phone Number: [phone] 11/01/2022 9:54 AM

## 2022-11-01 NOTE — Progress Notes (Signed)
   Heart Failure Stewardship Pharmacist Progress Note   PCP: Plotnikov, Georgina Quint, MD PCP-Cardiologist: Kristeen Miss, MD    HPI:  67 yo M with PMH of CAD, CHF, PAD, HTN, HLD, T2DM, and TIA.   Presented to [facility] on 4/25 for planned right TCAR. Procedure was then aborted due to VT arrest. Suspect this occurred due to scar from ischemic cardiomyopathy. Cardiology and EP consulted for MRI/cath and ICD. ECHO showed LVEF <20%, global hypokinesis, G2DD, RV moderately reduced. Taken for [facility] on 4/26 and found to have total occlusion of saphenous graft to OM 2 and saphenous graft to left PDA. No PCI targets. RA 9, PA 27, wedge 15, CO 4.8, CI 2.1. Cardiac MRI on 4/26 showed subendocardial late gadolinium enhancement consistent with infarct in basal to mid inferior wall and basal to apical lateral wall. LGE is greater than 50% transmural suggesting inferior and lateral walls are nonviable. LVEF 23%, RVEF 54%.  EP planning for ICD.   TCAR planned for 5/1.   Current HF Medications: Beta Blocker: carvedilol 6.25 mg BID ACE/ARB/ARNI: Entresto 24/26 mg BID MRA: spironolactone 12.5 mg daily SGLT2i: Jardiance 10 mg daily  Prior to admission HF Medications: Beta blocker: carvedilol 25 mg BID ACE/ARB/ARNI: Entresto 97/103 mg BID MRA: spironolactone 25 mg daily SGLT2i: Jardiance 12.5 mg daily  Pertinent Lab Values: Serum creatinine 1.10, BUN 16, Potassium 3.1, Sodium 135, Magnesium 2.0, A1c 6.5   Vital Signs: Weight: 217 lbs (admission weight: 223 lbs) Blood pressure: 130/60s  Heart rate: 70s  I/O: incomplete  Medication Assistance / Insurance Benefits Check: Does the patient have prescription insurance?  Yes Type of insurance plan: BCBS supplement + [facility]  Outpatient Pharmacy:  Prior to admission outpatient [facility] Is the patient willing to use [facility] TOC pharmacy at discharge? Yes Is the patient willing to transition their outpatient pharmacy to utilize a [facility] outpatient  pharmacy?   No    Assessment: 1. Acute on chronic systolic CHF (LVEF <20%), due to ICM. NYHA class II symptoms. - Not volume overloaded on exam, no indication for diuretics - Continue carvedilol 6.25 mg BID - Continue Entresto 24/26 mg BID - Continue spironolactone 12.5 mg daily, consider increasing back to 25 mg daily - Continue Jardiance 10 mg daily   Plan: 1) Medication changes recommended at this time: - Increase spironolactone to 25 mg daily  2) Patient assistance: - Has [facility] benefits  3)  Education  - Patient has been educated on current HF medications and potential additions to HF medication regimen - Patient verbalizes understanding that over the next few months, these medication doses may change and more medications may be added to optimize HF regimen - Patient has been educated on basic disease state pathophysiology and goals of therapy   Sharen Hones, PharmD, BCPS Heart Failure Stewardship Pharmacist Phone [phone]

## 2022-11-01 NOTE — Progress Notes (Signed)
Heart Failure Nurse Navigator Progress Note  PCP: Plotnikov, Georgina Quint, MD PCP-Cardiologist: Nahser Admission Diagnosis: None Admitted from: Vascular surgery  Presentation:   [name] presented for Planned surgery, Carotid artery stenosis, symptomatic. After procedure began had a VT arrest, compression started and ROSC was returned with in 1-2 minutes. Had R/L HC on 4/26 found a total occlusion of saphenous graft . EP is planning a ICD placement this admission. TCAR planned for 11/03/2022.   Patient was educated on the sign and symptoms of heart failure, daily weights, when to call his doctor or go to the ED. Diet/ fluid restrictions, taking all medications as prescribed and attending all medical appointments. Patient verbalized his understanding of education. A HF TOC appointment was scheduled for /14/2024 @ 3 pm.   ECHO/ LVEF: <20% Chronic HFrEF  Clinical Course:  Past Medical History:  Diagnosis Date   Aortic valve sclerosis    aortic valve sclerosis   Arthritis    CAD (coronary artery disease)    Myoview 5/18: 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 no ischemia EF 27%   Carotid artery disease (HCC)    chronic total occlusion LCCA, retrograde flow from LECA to LICA, ,, 0-39%LICA, s/p Right CEA 2018   CHF (congestive heart failure) (HCC)    Closed head injury with concussion    x2   Complication of anesthesia    Low blood pressure after colonoscopy   Diabetes mellitus without complication (HCC)    Dizziness    Ejection fraction < 50%    EF 40%, echo 2006, no ICD needed  / EF 40%, echo, 11/2009   Former cigarette smoker    Hx of CABG 01/2002   01/2002   Hyperlipidemia    Low HDL   Hypertension    Mitral regurgitation    mild, echo, 11/2009   Myocardial infarction [facility])    "they told me i had a silent heart attack"   Peripheral vascular disease (HCC)    Dr. Georganna Skeans   Sleep disorder    Dr Shelle Iron, lose weight and consider sleep study later   Sleep  disorder    TIA (transient ischemic attack)    by history   Volume overload      Social History   Socioeconomic History   Marital status: Married    Spouse name: Not on file   Number of children: Not on file   Years of education: Not on file   Highest education level: Not on file  Occupational History   Not on file  Tobacco Use   Smoking status: Former    Types: Cigarettes    Quit date: 05/05/2002    Years since quitting: 20.5    Passive exposure: Never   Smokeless tobacco: Never  Vaping Use   Vaping Use: Never used  Substance and Sexual Activity   Alcohol use: Yes    Alcohol/week: 0.0 standard drinks of alcohol    Comment: beer every now and then   Drug use: No   Sexual activity: Yes  Other Topics Concern   Not on file  Social History Narrative   No Regular exercise   Social Determinants of Health   Financial Resource Strain: Low Risk  (07/06/2022)   Overall Financial Resource Strain (CARDIA)    Difficulty of Paying Living Expenses: Not hard at all  Food Insecurity: No Food Insecurity (07/06/2022)   Hunger Vital Sign    Worried About Running Out of Food in the Last Year:  Never true    Ran Out of Food in the Last Year: Never true  Transportation Needs: No Transportation Needs (07/06/2022)   PRAPARE - Administrator, Civil Service (Medical): No    Lack of Transportation (Non-Medical): No  Physical Activity: Insufficiently Active (07/06/2022)   Exercise Vital Sign    Days of Exercise per Week: 5 days    Minutes of Exercise per Session: 20 min  Stress: No Stress Concern Present (07/06/2022)   [employer] of Occupational Health - Occupational Stress Questionnaire    Feeling of Stress : Not at all  Social Connections: Socially Integrated (07/06/2022)   Social Connection and Isolation Panel [NHANES]    Frequency of Communication with Friends and Family: More than three times a week    Frequency of Social Gatherings with Friends and Family: Once a week     Attends Religious Services: More than 4 times per year    Active Member of [employer] or Organizations: Yes    Attends Engineer, structural: More than 4 times per year    Marital Status: Married   Water engineer and Provision:  Detailed education and instructions provided on heart failure disease management including the following:  Signs and symptoms of Heart Failure When to call the physician Importance of daily weights Low sodium diet Fluid restriction Medication management Anticipated future follow-up appointments  Patient education given on each of the above topics.  Patient acknowledges understanding via teach back method and acceptance of all instructions.  Education Materials:  "Living Better With Heart Failure" Booklet, HF zone tool, & Daily Weight Tracker Tool.  Patient has scale at home: Yes Patient has pill box at home: Yes    High Risk Criteria for Readmission and/or Poor Patient Outcomes: Heart failure hospital admissions (last 6 months): 0  No Show rate: 2% Difficult social situation: No Demonstrates medication adherence: Yes Primary Language: English Literacy level: Reading, writing, and comprehension  Barriers of Care:   Diet/ fluid restriction  Daily weights   Considerations/Referrals:   Referral made to Heart Failure Pharmacist Stewardship: Yes Referral made to Heart Failure CSW/NCM TOC: NO Referral made to Heart & Vascular TOC clinic: Yes, 11/16/2022 @ 3 pm.  Items for Follow-up on DC/TOC: Continued HF education Diet/ fluids/ daily weights   Rhae Hammock, BSN, RN Heart Failure Teacher, adult education Only

## 2022-11-02 DIAGNOSIS — I469 Cardiac arrest, cause unspecified: Secondary | ICD-10-CM | POA: Diagnosis not present

## 2022-11-02 LAB — COMPREHENSIVE METABOLIC PANEL
ALT: 24 U/L (ref 0–44)
AST: 20 U/L (ref 15–41)
Albumin: 3.4 g/dL — ABNORMAL LOW (ref 3.5–5.0)
Alkaline Phosphatase: 48 U/L (ref 38–126)
Anion gap: 6 (ref 5–15)
BUN: 15 mg/dL (ref 8–23)
CO2: 24 mmol/L (ref 22–32)
Calcium: 8.6 mg/dL — ABNORMAL LOW (ref 8.9–10.3)
Chloride: 106 mmol/L (ref 98–111)
Creatinine, Ser: 1.11 mg/dL (ref 0.61–1.24)
GFR, Estimated: >60 mL/min (ref >60)
Glucose, Bld: 108 mg/dL — ABNORMAL HIGH (ref 70–99)
Potassium: 4.1 mmol/L (ref 3.5–5.1)
Sodium: 136 mmol/L (ref 135–145)
Total Bilirubin: 0.9 mg/dL (ref 0.3–1.2)
Total Protein: 6.3 g/dL — ABNORMAL LOW (ref 6.5–8.1)

## 2022-11-02 LAB — PHOSPHORUS: Phosphorus: 4.4 mg/dL (ref 2.5–4.6)

## 2022-11-02 LAB — CBC
HCT: 39.6 % (ref 39.0–52.0)
Hemoglobin: 13.8 g/dL (ref 13.0–17.0)
MCH: 33.3 pg (ref 26.0–34.0)
MCHC: 34.8 g/dL (ref 30.0–36.0)
MCV: 95.4 fL (ref 80.0–100.0)
Platelets: 144 10*3/uL — ABNORMAL LOW (ref 150–400)
RBC: 4.15 MIL/uL — ABNORMAL LOW (ref 4.22–5.81)
RDW: 12.5 % (ref 11.5–15.5)
WBC: 5.9 10*3/uL (ref 4.0–10.5)
nRBC: 0 % (ref 0.0–0.2)

## 2022-11-02 LAB — GLUCOSE, CAPILLARY
Glucose-Capillary: 103 mg/dL — ABNORMAL HIGH (ref 70–99)
Glucose-Capillary: 141 mg/dL — ABNORMAL HIGH (ref 70–99)
Glucose-Capillary: 118 mg/dL — ABNORMAL HIGH (ref 70–99)
Glucose-Capillary: 151 mg/dL — ABNORMAL HIGH (ref 70–99)
Glucose-Capillary: 172 mg/dL — ABNORMAL HIGH (ref 70–99)

## 2022-11-02 LAB — LIPOPROTEIN A (LPA): Lipoprotein (a): 71.3 nmol/L — ABNORMAL HIGH (ref <75.0)

## 2022-11-02 LAB — MAGNESIUM: Magnesium: 2 mg/dL (ref 1.7–2.4)

## 2022-11-02 MED ORDER — CEFAZOLIN SODIUM-DEXTROSE 2-4 GM/100ML-% IV SOLN
2.0000 g | INTRAVENOUS | Status: AC
  Administered 2022-11-03: 2 g via INTRAVENOUS

## 2022-11-02 MED ORDER — SODIUM CHLORIDE 0.9 % IV SOLN: INTRAVENOUS | Status: DC

## 2022-11-02 MED ORDER — SODIUM CHLORIDE 0.9 % IV SOLN
80.0000 mg | INTRAVENOUS | Status: AC
  Administered 2022-11-03: 80 mg
  Filled 2022-11-02: qty 2

## 2022-11-02 MED ORDER — ASPIRIN 81 MG PO CHEW
81.0000 mg | CHEWABLE_TABLET | Freq: Every day | ORAL | Status: DC
  Administered 2022-11-03: 81 mg via ORAL
  Filled 2022-11-02: qty 1

## 2022-11-02 MED ORDER — AMIODARONE HCL 200 MG PO TABS
200.0000 mg | ORAL_TABLET | Freq: Two times a day (BID) | ORAL | Status: DC
  Administered 2022-11-02 – 2022-11-03 (×3): 200 mg via ORAL
  Filled 2022-11-02 (×3): qty 1

## 2022-11-02 MED ORDER — CHLORHEXIDINE GLUCONATE 4 % EX SOLN
60.0000 mL | Freq: Once | CUTANEOUS | Status: AC
  Administered 2022-11-03: 4 via TOPICAL
  Filled 2022-11-02: qty 60

## 2022-11-02 MED ORDER — CHLORHEXIDINE GLUCONATE 4 % EX SOLN
60.0000 mL | Freq: Once | CUTANEOUS | Status: AC
  Administered 2022-11-02: 4 via TOPICAL
  Filled 2022-11-02: qty 60

## 2022-11-02 NOTE — H&P (View-Only) (Signed)
Patient Name: [name] Date of Encounter: 11/02/2022  Primary Cardiologist: Kristeen Miss, MD Electrophysiologist: New  Interval Summary   NAEO  Feeling well without complaint  Inpatient Medications    Scheduled Meds:  amiodarone  200 mg Oral BID   aspirin  325 mg Per Tube Once   Followed by   [START ON 11/03/2022] aspirin  81 mg Oral Daily   carvedilol  6.25 mg Oral BID WC   cholecalciferol  2,000 Units Oral Daily   empagliflozin  10 mg Oral Daily   insulin aspart  0-9 Units Subcutaneous Q4H   pantoprazole  40 mg Oral Daily   rosuvastatin  10 mg Oral Daily   sacubitril-valsartan  1 tablet Oral BID   sodium chloride flush  3 mL Intravenous Q12H   sodium chloride flush  3 mL Intravenous Q12H   spironolactone  12.5 mg Oral Daily   Continuous Infusions:  sodium chloride Stopped (10/28/22 2252)   sodium chloride     sodium chloride 50 mL/hr at 11/02/22 0446   PRN Meds: sodium chloride, sodium chloride, acetaminophen, alum & mag hydroxide-simeth, docusate sodium, guaiFENesin-dextromethorphan, hydrALAZINE, HYDROmorphone (DILAUDID) injection, labetalol, metoprolol tartrate, mouth rinse, phenol, polyethylene glycol, sodium chloride flush   Vital Signs    Vitals:   11/02/22 0254 11/02/22 0444 11/02/22 0832 11/02/22 1339  BP: (!) 100/51  130/80 113/63  Pulse: 80  68 67  Resp: 19  11 12   Temp: 97.8 F (36.6 C)  98.7 F (37.1 C) 98.4 F (36.9 C)  TempSrc: Oral  Oral Oral  SpO2:   97% 98%  Weight:  97.8 kg    Height:        Intake/Output Summary (Last 24 hours) at 11/02/2022 1444 Last data filed at 11/02/2022 0446 Gross per 24 hour  Intake 839.88 ml  Output --  Net 839.88 ml   Filed Weights   10/31/22 0600 11/01/22 0436 11/02/22 0444  Weight: 98.2 kg 98.7 kg 97.8 kg    Physical Exam    GEN- The patient is well appearing, alert and oriented x 3 today.   Lungs- Clear to ausculation bilaterally, normal work of breathing Cardiac- Regular rate and rhythm, no  murmurs, rubs or gallops GI- soft, NT, ND, + BS Extremities- no clubbing or cyanosis. No edema  Telemetry    Sinus rhythm (personally reviewed)  Hospital Course    [name] is a 67 y.o. male with a h/o CAD s/p CABG x4 in 01/2002, chronic HFrEF with EF of 30-35%, carotid artery disease s/p right CEA and patch angioplasty in 11/2016, PAD s/p right iliofemoral artery endarterectomy in May and patch angioplasty as well as stenting of bilateral common iliac arteries in 04/2021, hypertension, hyperlipidemia, type 2 diabetes mellitus, and TIA who is being seen 10/28/2022 for the evaluation of VT arrest at the request of Dr. Bjorn Pippin   Assessment & Plan    Cardiac arrest VT Presented for planned right transcarotid artery revascularization on 4/25, had cardiac arrest shortly after procedure started.  Initial rhythm was pulseless VT. Chest compressions were started and patient was shocked x 1 with quick return of ROSC.  Subsequently had second episode of VT but ROSC was obtained after brief ACLS  No strips available. We have started amiodarone to help bridge through procedures.  Would consider stopping in 2-3 months.    Pt has longstanding HF/ICM and is now s/p VT arrest. He was previously offered ICD for primary prevention in 2015, now, meets criteria for secondary prevention.  We have explained risks and benefits, and pt is agreeable to proceed this am.    Acute on chronic HFrEF EF this admission < 20%, severe LV diltation, grade 2 DD, Mod reduced RV function RHC/LHC: ostial LCX 99% and distal occluded, RCA mid occlusion, LIMA-diag patent, RIMA-mid LAD patent, SVG-Om2 occlude,d SVG-LPDA occluded. No PCI targets Mean PA 27, PCWP 15, CI 2.12 cMRI  Subendocardial late gadolinium enhancement consistent with infarct in basal to mid inferior wall and basal to apical lateral wall. LGE is greater than 50% transmural suggesting inferior and lateral walls are nonviable. LVEF 23% GDMT per gen  cards   With biventricular CHF, would benefit from HF f/u    PVCs/NSVT Potassium4.1 (04/30 0132) Magnesium  2.0 (04/30 0132) Creatinine, ser  1.11 (04/30 0132) Keep K > 4.0 and Mg > 2.0   Carotid disease We have recommended waiting approximately a week s/p ICD to start plavix, then would wait at least 2-3 weeks post procedure to do TCAR.  We have started amiodarone to help bridge pt through procedures.  Would consider stopping once stable post procedures in the coming months.   For questions or updates, please contact [facility] Please consult [email or web address] for contact info under Cardiology/STEMI.  Signed, Graciella Freer, PA-C  11/02/2022, 2:44 PM     I have seen and examined this patient with Otilio Saber.  Agree with above, note added to reflect my findings.  Patient feeling well this morning without complaint.  Ready for ICD implant.  GEN: Well nourished, well developed, in no acute distress  HEENT: normal  Neck: no JVD, carotid bruits, or masses Cardiac: RRR; no murmurs, rubs, or gallops,no edema  Respiratory:  clear to auscultation bilaterally, normal work of breathing GI: soft, nontender, nondistended, + BS MS: no deformity or atrophy  Skin: warm and dry Neuro:  Strength and sensation are intact Psych: euthymic mood, full affect   Ventricular tachycardia cardiac arrest: Plan for ICD implant today.  Risk and benefits have been discussed.  Risk include bleeding, tamponade, infection, pneumothorax, lead dislodgment, kidney damage, stroke, MI, death.  He understands these risks and is agreed to the procedure.  Dakota Stangl M. Jakim Drapeau MD 11/03/2022 6:52 AM

## 2022-11-02 NOTE — Progress Notes (Signed)
Patient Name: [name] Date of Encounter: 11/02/2022  Primary Cardiologist: Kristeen Miss, MD Electrophysiologist: New  Interval Summary   The patient is doing well today.  At this time, the patient denies chest pain, shortness of breath, or any new concerns.  Inpatient Medications    Scheduled Meds:  aspirin  325 mg Per Tube Once   Followed by   aspirin  81 mg Per Tube Daily   carvedilol  6.25 mg Oral BID WC   cholecalciferol  2,000 Units Oral Daily   empagliflozin  10 mg Oral Daily   insulin aspart  0-9 Units Subcutaneous Q4H   pantoprazole  40 mg Oral Daily   rosuvastatin  10 mg Oral Daily   sacubitril-valsartan  1 tablet Oral BID   sodium chloride flush  3 mL Intravenous Q12H   sodium chloride flush  3 mL Intravenous Q12H   spironolactone  12.5 mg Oral Daily   Continuous Infusions:  sodium chloride Stopped (10/28/22 2252)   sodium chloride     sodium chloride 50 mL/hr at 11/02/22 0446   PRN Meds: sodium chloride, sodium chloride, acetaminophen, alum & mag hydroxide-simeth, docusate sodium, guaiFENesin-dextromethorphan, hydrALAZINE, HYDROmorphone (DILAUDID) injection, labetalol, metoprolol tartrate, mouth rinse, phenol, polyethylene glycol, sodium chloride flush   Vital Signs    Vitals:   11/01/22 2323 11/02/22 0254 11/02/22 0444 11/02/22 0832  BP: 121/75 (!) 100/51  130/80  Pulse: 96 80  68  Resp: 19 19  11   Temp: 97.6 F (36.4 C) 97.8 F (36.6 C)  98.7 F (37.1 C)  TempSrc: Oral Oral  Oral  SpO2: 96%   97%  Weight:   97.8 kg   Height:        Intake/Output Summary (Last 24 hours) at 11/02/2022 0851 Last data filed at 11/02/2022 0446 Gross per 24 hour  Intake 839.88 ml  Output 0 ml  Net 839.88 ml   Filed Weights   10/31/22 0600 11/01/22 0436 11/02/22 0444  Weight: 98.2 kg 98.7 kg 97.8 kg    Physical Exam    GEN- The patient is well appearing, alert and oriented x 3 today.   Lungs- Clear to ausculation bilaterally, normal work of  breathing Cardiac- Regular rate and rhythm, no murmurs, rubs or gallops GI- soft, NT, ND, + BS Extremities- no clubbing or cyanosis. No edema  Telemetry    NSR with occasional PVCs 80-90s (personally reviewed)  Hospital Course    [name] is a 67 y.o. male with a h/o CAD s/p CABG x4 in 01/2002, chronic HFrEF with EF of 30-35%, carotid artery disease s/p right CEA and patch angioplasty in 11/2016, PAD s/p right iliofemoral artery endarterectomy in May and patch angioplasty as well as stenting of bilateral common iliac arteries in 04/2021, hypertension, hyperlipidemia, type 2 diabetes mellitus, and TIA who is being seen 10/28/2022 for the evaluation of VT arrest at the request of Dr. Bjorn Pippin   Assessment & Plan    Cardiac arrest Presented for planned right transcarotid artery revascularization on 4/25, had cardiac arrest shortly after procedure started.  Initial rhythm was pulseless VT. Chest compressions were started and patient was shocked x 1 with quick return of ROSC.  Subsequently had second episode of VT but ROSC was obtained after brief ACLS    Pt has longstanding HF/ICM and is now s/p VT arrest. He was previously offered ICD for primary prevention in 2015, now, meets criteria for secondary prevention.  We have explained risks and benefits, and pt is agreeable  to proceed at next available time.    Acute on chronic HFrEF EF this admision < 20%, severe LV diltation, grade 2 DD, Mod reduced RV function RHC/LHC: ostial LCX 99% and distal occluded, RCA mid occlusion, LIMA-diag patent, RIMA-mid LAD patent, SVG-Om2 occlude,d SVG-LPDA occluded. No PCI targets Mean PA 27, PCWP 15, CI 2.12 cMRI  Subendocardial late gadolinium enhancement consistent with infarct in basal to mid inferior wall and basal to apical lateral wall. LGE is greater than 50% transmural suggesting inferior and lateral walls are nonviable. LVEF 23% GDMT per gen cards   With biventricular CHF, would benefit from  HF f/u    PVCs/NSVT Potassium4.1 (04/30 0132) Magnesium  2.0 (04/30 0132) Creatinine, ser  1.11 (04/30 0132) Keep K > 4.0 and Mg > 2.0   Clear breakfast and then NPO in the event we are able to get to him today. He is very understanding.   For questions or updates, please contact [facility] Please consult [email or web address] for contact info under Cardiology/STEMI.  Signed, Graciella Freer, PA-C  11/02/2022, 8:51 AM

## 2022-11-02 NOTE — Progress Notes (Signed)
   Heart Failure Stewardship Pharmacist Progress Note   PCP: Plotnikov, Georgina Quint, MD PCP-Cardiologist: Kristeen Miss, MD    HPI:  67 yo M with PMH of CAD, CHF, PAD, HTN, HLD, T2DM, and TIA.   Presented to [facility] on 4/25 for planned right TCAR. Procedure was then aborted due to VT arrest. Suspect this occurred due to scar from ischemic cardiomyopathy. Cardiology and EP consulted for MRI/cath and ICD. ECHO showed LVEF <20%, global hypokinesis, G2DD, RV moderately reduced. Taken for [facility] on 4/26 and found to have total occlusion of saphenous graft to OM 2 and saphenous graft to left PDA. No PCI targets. RA 9, PA 27, wedge 15, CO 4.8, CI 2.1. Cardiac MRI on 4/26 showed subendocardial late gadolinium enhancement consistent with infarct in basal to mid inferior wall and basal to apical lateral wall. LGE is greater than 50% transmural suggesting inferior and lateral walls are nonviable. LVEF 23%, RVEF 54%.  EP planning for ICD.  TCAR planned for after ICD implant.    Current HF Medications: Beta Blocker: carvedilol 6.25 mg BID ACE/ARB/ARNI: Entresto 24/26 mg BID MRA: spironolactone 12.5 mg daily SGLT2i: Jardiance 10 mg daily  Prior to admission HF Medications: Beta blocker: carvedilol 25 mg BID ACE/ARB/ARNI: Entresto 97/103 mg BID MRA: spironolactone 25 mg daily SGLT2i: Jardiance 12.5 mg daily  Pertinent Lab Values: Serum creatinine 1.11, BUN 15, Potassium 4.1, Sodium 136, Magnesium 2.0, A1c 6.5   Vital Signs: Weight: 215 lbs (admission weight: 223 lbs) Blood pressure: 110-130/60s  Heart rate: 70s  I/O: incomplete  Medication Assistance / Insurance Benefits Check: Does the patient have prescription insurance?  Yes Type of insurance plan: BCBS supplement + [facility]  Outpatient Pharmacy:  Prior to admission outpatient [facility]: [facility] Is the patient willing to use [facility] T[facility] pharmacy at discharge? Yes Is the patient willing to transition their outpatient pharmacy to utilize a [facility] outpatient pharmacy?   No    Assessment: 1. Acute on chronic systolic CHF (LVEF <20%), due to ICM. NYHA class II symptoms. - Not volume overloaded on exam, no indication for diuretics - Continue carvedilol 6.25 mg BID - Continue Entresto 24/26 mg BID - Continue spironolactone 12.5 mg daily, consider increasing back to 25 mg daily - Continue Jardiance 10 mg daily   Plan: 1) Medication changes recommended at this time: - Increase spironolactone to 25 mg daily  2) Patient assistance: - Has [facility] benefits  3)  Education  - Patient has been educated on current HF medications and potential additions to HF medication regimen - Patient verbalizes understanding that over the next few months, these medication doses may change and more medications may be added to optimize HF regimen - Patient has been educated on basic disease state pathophysiology and goals of therapy   Sharen Hones, PharmD, BCPS Heart Failure Stewardship Pharmacist Phone [phone]

## 2022-11-02 NOTE — Progress Notes (Incomplete)
Patient Name: [name] Date of Encounter: 11/03/2022  Primary Cardiologist: Kristeen Miss, MD Electrophysiologist: New  Interval Summary   NAEO  Feeling well without complaint  Inpatient Medications    Scheduled Meds:  [MAR Hold] amiodarone  200 mg Oral BID   [MAR Hold] aspirin  325 mg Per Tube Once   Followed by   [facility] Hold] aspirin  81 mg Oral Daily   [MAR Hold] carvedilol  6.25 mg Oral BID WC   [MAR Hold] cholecalciferol  2,000 Units Oral Daily   [MAR Hold] empagliflozin  10 mg Oral Daily   gentamicin (GARAMYCIN) 80 mg in sodium chloride 0.9 % 500 mL irrigation  80 mg Irrigation To Cath   [MAR Hold] insulin aspart  0-9 Units Subcutaneous Q4H   [MAR Hold] pantoprazole  40 mg Oral Daily   [MAR Hold] rosuvastatin  10 mg Oral Daily   [MAR Hold] sacubitril-valsartan  1 tablet Oral BID   sodium chloride 0.9 % with gentamicin (GARAMYCIN) ADS Med       [MAR Hold] sodium chloride flush  3 mL Intravenous Q12H   [MAR Hold] sodium chloride flush  3 mL Intravenous Q12H   [MAR Hold] spironolactone  12.5 mg Oral Daily   Continuous Infusions:  [MAR Hold] sodium chloride Stopped (10/28/22 2252)   [MAR Hold] sodium chloride     sodium chloride 50 mL/hr at 11/03/22 0101   sodium chloride 50 mL/hr at 11/03/22 0100   ceFAZolin      ceFAZolin (ANCEF) IV 2 g (11/03/22 0706)   PRN Meds: [ZOX Hold] sodium chloride, [MAR Hold] sodium chloride, [MAR Hold] acetaminophen, [MAR Hold] alum & mag hydroxide-simeth, ceFAZolin, [MAR Hold] docusate sodium, [MAR Hold] guaiFENesin-dextromethorphan, Heparin (Porcine) in NaCl, [MAR Hold] hydrALAZINE, [MAR Hold]  HYDROmorphone (DILAUDID) injection, [MAR Hold] labetalol, [MAR Hold] metoprolol tartrate, [MAR Hold] mouth rinse, [MAR Hold] phenol, [MAR Hold] polyethylene glycol, sodium chloride 0.9 % with gentamicin (GARAMYCIN) ADS Med, [MAR Hold] sodium chloride flush   Vital Signs    Vitals:   11/02/22 2008 11/02/22 2359 11/03/22 0500 11/03/22 0708  BP:  116/68 132/74    Pulse:      Resp: (!) 22 17    Temp: 98.9 F (37.2 C) 98.8 F (37.1 C)    TempSrc: Oral Oral    SpO2:    98%  Weight:   97.6 kg   Height:        Intake/Output Summary (Last 24 hours) at 11/03/2022 0713 Last data filed at 11/03/2022 0300 Gross per 24 hour  Intake 438.08 ml  Output --  Net 438.08 ml   Filed Weights   11/01/22 0436 11/02/22 0444 11/03/22 0500  Weight: 98.7 kg 97.8 kg 97.6 kg    Physical Exam    GEN- The patient is well appearing, alert and oriented x 3 today.   Lungs- Clear to ausculation bilaterally, normal work of breathing Cardiac- Regular rate and rhythm, no murmurs, rubs or gallops GI- soft, NT, ND, + BS Extremities- no clubbing or cyanosis. No edema  Telemetry    Sinus rhythm (personally reviewed)  Hospital Course    [name] is a 67 y.o. male with a h/o CAD s/p CABG x4 in 01/2002, chronic HFrEF with EF of 30-35%, carotid artery disease s/p right CEA and patch angioplasty in 11/2016, PAD s/p right iliofemoral artery endarterectomy in May and patch angioplasty as well as stenting of bilateral common iliac arteries in 04/2021, hypertension, hyperlipidemia, type 2 diabetes mellitus, and TIA who is  being seen 10/28/2022 for the evaluation of VT arrest at the request of Dr. Bjorn Pippin   Assessment & Plan    Cardiac arrest VT Presented for planned right transcarotid artery revascularization on 4/25, had cardiac arrest shortly after procedure started.  Initial rhythm was pulseless VT. Chest compressions were started and patient was shocked x 1 with quick return of ROSC.  Subsequently had second episode of VT but ROSC was obtained after brief ACLS  No strips available. We have started amiodarone to help bridge through procedures.  Would consider stopping in 2-3 months.    Pt has longstanding HF/ICM and is now s/p VT arrest. He was previously offered ICD for primary prevention in 2015, now, meets criteria for secondary prevention.   We  have explained risks and benefits, and pt is agreeable to proceed this am.    Acute on chronic HFrEF EF this admission < 20%, severe LV diltation, grade 2 DD, Mod reduced RV function RHC/LHC: ostial LCX 99% and distal occluded, RCA mid occlusion, LIMA-diag patent, RIMA-mid LAD patent, SVG-Om2 occlude,d SVG-LPDA occluded. No PCI targets Mean PA 27, PCWP 15, CI 2.12 cMRI  Subendocardial late gadolinium enhancement consistent with infarct in basal to mid inferior wall and basal to apical lateral wall. LGE is greater than 50% transmural suggesting inferior and lateral walls are nonviable. LVEF 23% GDMT per gen cards   With biventricular CHF, would benefit from HF f/u    PVCs/NSVT Potassium4.1 (04/30 0132) Magnesium  2.0 (04/30 0132) Creatinine, ser  1.11 (04/30 0132) Keep K > 4.0 and Mg > 2.0   Carotid disease We have recommended waiting approximately a week s/p ICD to start plavix, then would wait at least 2-3 weeks post procedure to do TCAR.  We have started amiodarone to help bridge pt through procedures.  Would consider stopping once stable post procedures in the coming months.   For questions or updates, please contact [facility] Please consult [email or web address] for contact info under Cardiology/STEMI.  Signed, Otilio Saber 11/03/2022, 7:13 AM     I have seen and examined this patient with Otilio Saber.  Agree with above, note added to reflect my findings.  Patient feeling well this morning without complaint.  Ready for ICD implant.  GEN: Well nourished, well developed, in no acute distress  HEENT: normal  Neck: no JVD, carotid bruits, or masses Cardiac: RRR; no murmurs, rubs, or gallops,no edema  Respiratory:  clear to auscultation bilaterally, normal work of breathing GI: soft, nontender, nondistended, + BS MS: no deformity or atrophy  Skin: warm and dry Neuro:  Strength and sensation are intact Psych: euthymic mood, full affect   Ventricular tachycardia cardiac arrest:  Plan for ICD implant today.  Risk and benefits have been discussed.  Risk include bleeding, tamponade, infection, pneumothorax, lead dislodgment, kidney damage, stroke, MI, death.  He understands these risks and is agreed to the procedure.  Iseah Plouff M. Samyak Sackmann MD 11/03/2022 7:13 AM

## 2022-11-02 NOTE — Progress Notes (Signed)
  Progress Note    11/02/2022 7:36 AM 4 Days Post-Op  Subjective:  no complaints, sitting up on side of bed   Vitals:   11/01/22 2323 11/02/22 0254  BP: 121/75 (!) 100/51  Pulse: 96 80  Resp: 19 19  Temp: 97.6 F (36.4 C) 97.8 F (36.6 C)  SpO2: 96%    Physical Exam: Cardiac:  regular Lungs:  non labored Extremities:  moving all extremities without deficits Neurologic: alert and oriented  CBC    Component Value Date/Time   WBC 5.9 11/02/2022 0132   RBC 4.15 (L) 11/02/2022 0132   HGB 13.8 11/02/2022 0132   HCT 39.6 11/02/2022 0132   PLT 144 (L) 11/02/2022 0132   MCV 95.4 11/02/2022 0132   MCH 33.3 11/02/2022 0132   MCHC 34.8 11/02/2022 0132   RDW 12.5 11/02/2022 0132   LYMPHSABS 1.5 10/28/2022 1150   MONOABS 0.5 10/28/2022 1150   EOSABS 0.2 10/28/2022 1150   BASOSABS 0.0 10/28/2022 1150    BMET    Component Value Date/Time   NA 136 11/02/2022 0132   K 4.1 11/02/2022 0132   CL 106 11/02/2022 0132   CO2 24 11/02/2022 0132   GLUCOSE 108 (H) 11/02/2022 0132   BUN 15 11/02/2022 0132   CREATININE 1.11 11/02/2022 0132   CALCIUM 8.6 (L) 11/02/2022 0132   GFRNONAA >60 11/02/2022 0132   GFRAA >60 05/22/2018 1049    INR    Component Value Date/Time   INR 1.1 10/29/2022 1522     Intake/Output Summary (Last 24 hours) at 11/02/2022 0736 Last data filed at 11/02/2022 0446 Gross per 24 hour  Intake 839.88 ml  Output 0 ml  Net 839.88 ml     Assessment/Plan:  67 y.o. male is s/p aborted right TCAR due to cardiac arrest x 2   Remains neurologically intact Plan is for ICD placement at next availability today or over next couple days Pending right TCAR following ICD placement Will await recs by EP regarding TCAR timing  Dory Horn [facility] [phone] 11/02/2022 7:36 AM

## 2022-11-03 ENCOUNTER — Inpatient Hospital Stay (HOSPITAL_COMMUNITY)
Admission: RE | Disposition: A | Discharge status: Home Health | Payer: Self-pay | Source: Home / Self Care | Attending: Vascular Surgery

## 2022-11-03 ENCOUNTER — Inpatient Hospital Stay (HOSPITAL_COMMUNITY): Payer: Medicare Other

## 2022-11-03 ENCOUNTER — Encounter (HOSPITAL_COMMUNITY): Payer: Self-pay | Admitting: Certified Registered"

## 2022-11-03 ENCOUNTER — Inpatient Hospital Stay: Admit: 2022-11-03 | Payer: Medicare Other | Admitting: Vascular Surgery

## 2022-11-03 ENCOUNTER — Other Ambulatory Visit: Payer: Self-pay

## 2022-11-03 ENCOUNTER — Other Ambulatory Visit (HOSPITAL_COMMUNITY): Payer: Self-pay

## 2022-11-03 DIAGNOSIS — Z95810 Presence of automatic (implantable) cardiac defibrillator: Secondary | ICD-10-CM

## 2022-11-03 DIAGNOSIS — I469 Cardiac arrest, cause unspecified: Secondary | ICD-10-CM | POA: Diagnosis not present

## 2022-11-03 HISTORY — PX: ICD IMPLANT: EP1208

## 2022-11-03 HISTORY — DX: Presence of automatic (implantable) cardiac defibrillator: Z95.810

## 2022-11-03 LAB — GLUCOSE, CAPILLARY
Glucose-Capillary: 110 mg/dL — ABNORMAL HIGH (ref 70–99)
Glucose-Capillary: 123 mg/dL — ABNORMAL HIGH (ref 70–99)
Glucose-Capillary: 135 mg/dL — ABNORMAL HIGH (ref 70–99)
Glucose-Capillary: 189 mg/dL — ABNORMAL HIGH (ref 70–99)

## 2022-11-03 SURGERY — ICD IMPLANT

## 2022-11-03 SURGERY — TRANSCAROTID ARTERY REVASCULARIZATION (TCAR)
Anesthesia: General | Laterality: Right

## 2022-11-03 MED ORDER — FENTANYL CITRATE (PF) 100 MCG/2ML IJ SOLN
INTRAMUSCULAR | Status: AC
  Filled 2022-11-03: qty 2

## 2022-11-03 MED ORDER — LIDOCAINE HCL (PF) 1 % IJ SOLN
INTRAMUSCULAR | Status: DC | PRN
  Administered 2022-11-03: 60 mL

## 2022-11-03 MED ORDER — CARVEDILOL 6.25 MG PO TABS
6.2500 mg | ORAL_TABLET | Freq: Two times a day (BID) | ORAL | 2 refills | Status: DC
  Filled 2022-11-03 – 2022-12-04 (×2): qty 60, 30d supply, fill #0

## 2022-11-03 MED ORDER — FENTANYL CITRATE (PF) 100 MCG/2ML IJ SOLN
INTRAMUSCULAR | Status: DC | PRN
  Administered 2022-11-03: 25 ug via INTRAVENOUS

## 2022-11-03 MED ORDER — LIDOCAINE HCL (PF) 1 % IJ SOLN
INTRAMUSCULAR | Status: AC
  Filled 2022-11-03: qty 60

## 2022-11-03 MED ORDER — SACUBITRIL-VALSARTAN 24-26 MG PO TABS
1.0000 | ORAL_TABLET | Freq: Two times a day (BID) | ORAL | 3 refills | Status: DC
  Filled 2022-11-03 – 2022-12-04 (×2): qty 60, 30d supply, fill #0

## 2022-11-03 MED ORDER — SODIUM CHLORIDE 0.9 % IV SOLN
INTRAVENOUS | Status: AC
  Filled 2022-11-03: qty 2

## 2022-11-03 MED ORDER — MIDAZOLAM HCL 5 MG/5ML IJ SOLN
INTRAMUSCULAR | Status: DC | PRN
  Administered 2022-11-03: 1 mg via INTRAVENOUS

## 2022-11-03 MED ORDER — ONDANSETRON HCL 4 MG/2ML IJ SOLN: 4.0000 mg | Freq: Four times a day (QID) | INTRAMUSCULAR | Status: DC | PRN

## 2022-11-03 MED ORDER — CLOPIDOGREL BISULFATE 75 MG PO TABS
75.0000 mg | ORAL_TABLET | Freq: Every day | ORAL | 6 refills | Status: DC
  Filled 2022-11-03: qty 30, 30d supply, fill #0

## 2022-11-03 MED ORDER — EMPAGLIFLOZIN 10 MG PO TABS
10.0000 mg | ORAL_TABLET | Freq: Every day | ORAL | 3 refills | Status: AC
  Filled 2022-11-03 – 2022-12-04 (×2): qty 30, 30d supply, fill #0

## 2022-11-03 MED ORDER — MIDAZOLAM HCL 2 MG/2ML IJ SOLN
INTRAMUSCULAR | Status: AC
  Filled 2022-11-03: qty 2

## 2022-11-03 MED ORDER — CEFAZOLIN SODIUM-DEXTROSE 1-4 GM/50ML-% IV SOLN
1.0000 g | Freq: Four times a day (QID) | INTRAVENOUS | Status: DC
  Administered 2022-11-03: 1 g via INTRAVENOUS
  Filled 2022-11-03 (×2): qty 50

## 2022-11-03 MED ORDER — CEFAZOLIN SODIUM-DEXTROSE 2-4 GM/100ML-% IV SOLN
INTRAVENOUS | Status: AC
  Filled 2022-11-03: qty 100

## 2022-11-03 MED ORDER — OXYCODONE-ACETAMINOPHEN 5-325 MG PO TABS
1.0000 | ORAL_TABLET | Freq: Four times a day (QID) | ORAL | 0 refills | Status: DC | PRN
  Filled 2022-11-03: qty 10, 3d supply, fill #0

## 2022-11-03 MED ORDER — HEPARIN (PORCINE) IN NACL 1000-0.9 UT/500ML-% IV SOLN
INTRAVENOUS | Status: DC | PRN
  Administered 2022-11-03: 500 mL

## 2022-11-03 SURGICAL SUPPLY — 7 items
CABLE SURGICAL S-101-97-12 (CABLE) ×1 IMPLANT
ICD COBALT XT VR DVPA2D4 (ICD Generator) IMPLANT
LEAD SPRINT QUAT SEC 6935M-62 (Lead) IMPLANT
PAD DEFIB RADIO PHYSIO CONN (PAD) ×1 IMPLANT
SHEATH 9FR PRELUDE SNAP 13 (SHEATH) IMPLANT
SHEATH PROBE COVER 6X72 (BAG) IMPLANT
TRAY PACEMAKER INSERTION (PACKS) ×1 IMPLANT

## 2022-11-03 NOTE — Progress Notes (Addendum)
  Progress Note    11/03/2022 7:27 AM Day of Surgery  Patient off the floor for ICD placement this morning Will follow up later this afternoon May be able to d/c this afternoon vs tomorrow pending Cardiology/EP recs Right TCAR will be arranged as outpatient in 2-3 weeks   Graceann Congress, PA-C [facility] [phone] 11/03/2022 7:27 AM  VASCULAR STAFF ADDENDUM: I have independently interviewed and examined the patient. I agree with the above.  Discharge today.  Start Plavix Sunday 11/07/22. R TCAR 11/17/22.  Rande Brunt. Lenell Antu, MD [facility] Phone Number: [phone] 11/03/2022 3:17 PM

## 2022-11-03 NOTE — Progress Notes (Signed)
   Heart Failure Stewardship Pharmacist Progress Note   PCP: Plotnikov, Georgina Quint, MD PCP-Cardiologist: Kristeen Miss, MD    HPI:  67 yo M with PMH of CAD, CHF, PAD, HTN, HLD, T2DM, and TIA.   Presented to [facility] on 4/25 for planned right TCAR. Procedure was then aborted due to VT arrest. Suspect this occurred due to scar from ischemic cardiomyopathy. Cardiology and EP consulted for MRI/cath and ICD. ECHO showed LVEF <20%, global hypokinesis, G2DD, RV moderately reduced. 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 No PCI targets. RA 9, PA 27, wedge 15, CO 4.8, CI 2.1. Cardiac MRI on 4/26 showed subendocardial late gadolinium enhancement consistent with infarct in basal to mid inferior wall and basal to apical lateral wall. LGE is greater than 50% transmural suggesting inferior and lateral walls are nonviable. LVEF 23%, RVEF 54%.  EP placing ICD on 5/1.  TCAR planned for outpatient in 2-3 weeks per vascular.    Current HF Medications: Beta Blocker: carvedilol 6.25 mg BID ACE/ARB/ARNI: Entresto 24/26 mg BID MRA: spironolactone 12.5 mg daily SGLT2i: Jardiance 10 mg daily  Prior to admission HF Medications: Beta blocker: carvedilol 25 mg BID ACE/ARB/ARNI: Entresto 97/103 mg BID MRA: spironolactone 25 mg daily SGLT2i: Jardiance 12.5 mg daily  Pertinent Lab Values: Serum creatinine 1.11, BUN 15, Potassium 4.1, Sodium 136, Magnesium 2.0, A1c 6.5   Vital Signs: Weight: 215 lbs (admission weight: 223 lbs) Blood pressure: 130/80s  Heart rate: 60-70s  I/O: incomplete  Medication Assistance / Insurance Benefits Check: Does the patient have prescription insurance?  Yes Type of insurance plan: BCBS supplement + [facility]  Outpatient Pharmacy:  Prior to admission outpatient [facility] Is the patient willing to use [facility] TOC pharmacy at discharge? Yes Is the patient willing to transition their outpatient pharmacy to  utilize a [facility] outpatient pharmacy?   No    Assessment: 1. Acute on chronic systolic CHF (LVEF <20%), due to ICM. NYHA class II symptoms. - Not volume overloaded on exam, no indication for diuretics - Continue carvedilol 6.25 mg BID - Continue Entresto 24/26 mg BID - Continue spironolactone 12.5 mg daily, consider increasing back to 25 mg daily - Continue Jardiance 10 mg daily   Plan: 1) Medication changes recommended at this time: - Increase spironolactone to 25 mg daily  2) Patient assistance: - Has [facility] benefits  3)  Education  - Patient has been educated on current HF medications and potential additions to HF medication regimen - Patient verbalizes understanding that over the next few months, these medication doses may change and more medications may be added to optimize HF regimen - Patient has been educated on basic disease state pathophysiology and goals of therapy   Sharen Hones, PharmD, BCPS Heart Failure Stewardship Pharmacist Phone [phone]

## 2022-11-03 NOTE — Plan of Care (Signed)
DISCHARGE NOTE HOME ACHILLE XIANG to be discharged home per MD order. Discussed prescriptions and follow up appointments with the patient. Prescriptions given to patient; medication list explained in detail. Patient verbalized understanding.  Skin clean, dry and intact without evidence of skin break down, no evidence of skin tears noted. IV catheter discontinued intact. Site without signs and symptoms of complications. Dressing and pressure applied. Pt denies pain at the site currently. No complaints noted.  Patient free of lines, drains, and wounds.   An After Visit Summary (AVS) was printed and given to the patient. Patient escorted via wheelchair, and discharged home via private auto.  Arlice Colt, RN

## 2022-11-03 NOTE — Interval H&P Note (Signed)
History and Physical Interval Note:  11/03/2022 6:55 AM  [name]  has presented today for surgery, with the diagnosis of vt.  The various methods of treatment have been discussed with the patient and family. After consideration of risks, benefits and other options for treatment, the patient has consented to  Procedure(s): ICD IMPLANT (N/A) as a surgical intervention.  The patient's history has been reviewed, patient examined, no change in status, stable for surgery.  I have reviewed the patient's chart and labs.  Questions were answered to the patient's satisfaction.     [name] [name]  ICD Criteria  Current LVEF:23%. Within 12 months prior to implant: Yes   Heart failure history: Yes, Class II  Cardiomyopathy history: Yes, Ischemic Cardiomyopathy - Prior MI.  Atrial Fibrillation/Atrial Flutter: No.  Ventricular tachycardia history: Yes, Hemodynamic instability present. VT Type: Sustained Ventricular Tachycardia - Monomorphic.  Cardiac arrest history: Yes, Ventricular Tachycardia.  History of syndromes with risk of sudden death: No.  Previous ICD: No.  Current ICD indication: Secondary  PPM indication: No.  Class I or II Bradycardia indication present: No  Beta Blocker therapy for 3 or more months: Yes, prescribed.   Ace Inhibitor/ARB therapy for 3 or more months: Yes, prescribed.    I have seen [name] is a 67 y.o. malepre-procedural and has been referred by Turner/Hawkins for consideration of ICD implant for secondary prevention of sudden death.  The patient's chart has been reviewed and they meet criteria for ICD implant.  I have had a thorough discussion with the patient reviewing options.  The patient and their family (if available) have had opportunities to ask questions and have them answered. The patient and I have decided together through the [facility] Heart Care Share Decision Support Tool to implant ICD at this time.  Risks, benefits, alternatives to ICD  implantation were discussed in detail with the patient today. The patient  understands that the risks include but are not limited to bleeding, infection, pneumothorax, perforation, tamponade, vascular damage, renal failure, MI, stroke, death, inappropriate shocks, and lead dislodgement and  wishes to proceed.

## 2022-11-03 NOTE — TOC Transition Note (Signed)
Transition of Care (TOC) - CM/SW Discharge Note Donn Pierini RN, BSN Transitions of Care Unit 4E- RN Case Manager See Treatment Team for direct phone #   Patient Details  Name: [name] MRN: [id number] Date of Birth: 21-Jan-1956  Transition of Care [facility]) CM/SW Contact:  Darrold Span, RN Phone Number: 11/03/2022, 2:57 PM   Clinical Narrative:    Pt stable for transition home today, CM Notified by [facility] liaison Rhonda [phone]) -following patient with MD office protocol referral prearranged for [facility] needs. Pt s/p ICD placement and note that plan is for pt so come back for planned TCAR.  CM notified [facility] liaison and they will follow up for any HH needs at this time.    Final next level of care: Home w Home Health Services Barriers to Discharge: No Barriers Identified   Patient Goals and CMS Choice    Return home  Discharge Placement                 Home w/ [facility]        Discharge Plan and Services Additional resources added to the After Visit Summary for   In-house Referral: NA Discharge Planning Services: CM Consult Post Acute Care Choice: Home Health          DME Arranged: N/A DME Agency: NA       HH Arranged: NA HH Agency: NA        Social Determinants of Health (SDOH) Interventions SDOH Screenings   Food Insecurity: No Food Insecurity (11/01/2022)  Housing: Low Risk  (11/01/2022)  Transportation Needs: No Transportation Needs (11/01/2022)  Utilities: Not At Risk (11/02/2022)  Alcohol Screen: Low Risk  (11/01/2022)  Depression (PHQ2-9): Low Risk  (07/06/2022)  Financial Resource Strain: Low Risk  (11/01/2022)  Physical Activity: Insufficiently Active (07/06/2022)  Social Connections: Socially Integrated (07/06/2022)  Stress: No Stress Concern Present (07/06/2022)  Tobacco Use: Medium Risk (11/01/2022)     Readmission Risk Interventions    11/03/2022    2:57 PM  Readmission Risk Prevention Plan  Transportation Screening Complete  PCP or  Specialist Appt within 5-7 Days Complete  Home Care Screening Complete  Medication Review (RN CM) Complete

## 2022-11-03 NOTE — Discharge Instructions (Signed)
After Your ICD (Implantable Cardiac Defibrillator)   You have a Medtronic ICD  ACTIVITY Do not lift your arm above shoulder height for 1 week after your procedure. After 7 days, you may progress as below.  You should remove your sling 24 hours after your procedure, unless otherwise instructed by your provider.     Wednesday Nov 10, 2022  Thursday Nov 11, 2022 Friday Nov 12, 2022 Saturday Nov 13, 2022   Do not lift, push, pull, or carry anything over 10 pounds with the affected arm until 6 weeks (Wednesday December 15, 2022 ) after your procedure.   You may drive AFTER your wound check, unless you have been told otherwise by your provider.   Ask your healthcare provider when you can go back to work   INCISION/Dressing If you are on a blood thinner such as Coumadin, Xarelto, Eliquis, Plavix, or Pradaxa please confirm with your provider when this should be resumed.   If large square, outer bandage is left in place, this can be removed after 24 hours from your procedure. Do not remove steri-strips or glue as below.   Monitor your defibrillator site for redness, swelling, and drainage. Call the device clinic at [phone] if you experience these symptoms or fever/chills.  If your incision is sealed with Steri-strips or staples, you may shower 7 days after your procedure or when told by your provider. Do not remove the steri-strips or let the shower hit directly on your site. You may wash around your site with soap and water.    If you were discharged in a sling, please do not wear this during the day more than 48 hours after your surgery unless otherwise instructed. This may increase the risk of stiffness and soreness in your shoulder.   Avoid lotions, ointments, or perfumes over your incision until it is well-healed.  You may use a hot tub or a pool AFTER your wound check appointment if the incision is completely closed.  Your ICD is designed to protect you from life threatening heart  rhythms. Because of this, you may receive a shock.   1 shock with no symptoms:  Call the office during business hours. 1 shock with symptoms (chest pain, chest pressure, dizziness, lightheadedness, shortness of breath, overall feeling unwell):  Call 911. If you experience 2 or more shocks in 24 hours:  Call 911. If you receive a shock, you should not drive for 6 months per the [place] DMV IF you receive appropriate therapy from your ICD.   ICD Alerts:  Some alerts are vibratory and others beep. These are NOT emergencies. Please call our office to let us know. If this occurs at night or on weekends, it can wait until the next business day. Send a remote transmission.  If your device is capable of reading fluid status (for heart failure), you will be offered monthly monitoring to review this with you.   DEVICE MANAGEMENT Remote monitoring is used to monitor your ICD from home. This monitoring is scheduled every 91 days by our office. It allows [place] to keep an eye on the functioning of your device to ensure it is working properly. You will routinely see your Electrophysiologist annually (more often if necessary).   You should receive your ID card for your new device in 4-8 weeks. Keep this card with you at all times once received. Consider wearing a medical alert bracelet or necklace.  Your ICD  may be MRI compatible. This will be discussed at your next  office visit/wound check.  You should avoid contact with strong electric or magnetic fields.   Do not use amateur (ham) radio equipment or electric (arc) welding torches. MP3 player headphones with magnets should not be used. Some devices are safe to use if held at least 12 inches (30 cm) from your defibrillator. These include power tools, lawn mowers, and speakers. If you are unsure if something is safe to use, ask your health care provider.  When using your cell phone, hold it to the ear that is on the opposite side from the defibrillator. Do not leave  your cell phone in a pocket over the defibrillator.  You may safely use electric blankets, heating pads, computers, and microwave ovens.  Call the office right away if: You have chest pain. You feel more than one shock. You feel more short of breath than you have felt before. You feel more light-headed than you have felt before. Your incision starts to open up.  This information is not intended to replace advice given to you by your health care provider. Make sure you discuss any questions you have with your health care provider.

## 2022-11-04 ENCOUNTER — Encounter (HOSPITAL_COMMUNITY): Payer: Self-pay | Admitting: Cardiology

## 2022-11-04 ENCOUNTER — Telehealth: Payer: Self-pay | Admitting: Cardiology

## 2022-11-04 ENCOUNTER — Telehealth: Payer: Self-pay

## 2022-11-04 ENCOUNTER — Other Ambulatory Visit: Payer: Self-pay

## 2022-11-04 ENCOUNTER — Telehealth: Payer: Self-pay | Admitting: *Deleted

## 2022-11-04 DIAGNOSIS — I6521 Occlusion and stenosis of right carotid artery: Secondary | ICD-10-CM

## 2022-11-04 NOTE — Transitions of Care (Post Inpatient/ED Visit) (Signed)
   11/04/2022  Name: [name] MRN: [id number] DOB: 08-Sep-1955  Today's TOC FU Call Status: Today's TOC FU Call Status:: Unsuccessul Call (1st Attempt) Unsuccessful Call (1st Attempt) Date: 11/04/22  Attempted to reach the patient regarding the most recent Inpatient visit; left HIPAA compliant voice message requesting call back  Follow Up Plan: Additional outreach attempts will be made to reach the patient to complete the Transitions of Care (Post Inpatient visit) call.   Caryl Pina, RN, BSN, CCRN Alumnus RN CM Care Coordination/ Transition of Care- [facility] Care Management [phone]: direct office

## 2022-11-04 NOTE — Telephone Encounter (Signed)
Attempted to reach patient to discuss rescheduling TCAR surgery on 5/15, but no answer. Left VM to return call.

## 2022-11-04 NOTE — Telephone Encounter (Signed)
Patient's spouse is calling to r/s the patient's appt for a wound check.

## 2022-11-04 NOTE — Telephone Encounter (Signed)
Spoke with patient's wife, Clydie Braun. Surgery rescheduled on 5/15. Instructions provided, included to continue ASA and Plavix, with last dose of Ozempic on 5/4. She verbalized understanding and repeated back information.

## 2022-11-05 ENCOUNTER — Telehealth: Payer: Self-pay | Admitting: *Deleted

## 2022-11-05 ENCOUNTER — Encounter: Payer: Self-pay | Admitting: *Deleted

## 2022-11-05 MED FILL — Fentanyl Citrate Preservative Free (PF) Inj 100 MCG/2ML: INTRAMUSCULAR | Qty: 2 | Status: CN

## 2022-11-05 MED FILL — Midazolam HCl Inj 2 MG/2ML (Base Equivalent): INTRAMUSCULAR | Qty: 1 | Status: AC

## 2022-11-05 NOTE — Transitions of Care (Post Inpatient/ED Visit) (Signed)
11/05/2022  Name: [name] MRN: [id number] DOB: 04-17-1956  Today's TOC FU Call Status: Today's TOC FU Call Status:: Successful TOC FU Call Competed TOC FU Call Complete Date: 11/05/22  Transition Care Management Follow-up Telephone Call Date of Discharge: 11/03/22 Discharge Facility: [facility]) Type of Discharge: Inpatient Admission Primary Inpatient Discharge Diagnosis:: cardiac arrest post-carotid revascularization procedure; ICD placement with planned carotid revascularization on 11/17/22 How have you been since you were released from the hospital?: Better Any questions or concerns?: No  Items Reviewed: Did you receive and understand the discharge instructions provided?: Yes (thoroughly reviewed with patient who verbalizes good understanding of same-- patient and spouse have numerous specific questions about discharge instructions-- all were addressed accordingly) Medications obtained,verified, and reconciled?: Yes (Medications Reviewed) (Full medication reconciliation/ review completed; no concerns or discrepancies identified; confirmed patient obtained/ is taking all newly Rx'd medications as instructed; self-manages medications and denies questions/ concerns around medications today) Any new allergies since your discharge?: No Dietary orders reviewed?: Yes Type of Diet Ordered:: Heart Healthy/ low saly/ no caffeine Do you have support at home?: Yes People in Home: spouse Name of Support/Comfort Primary Source: Reports independent in self-care activities; supportive spouse assists as/ if needed/ indicated  Medications Reviewed Today: Medications Reviewed Today     Reviewed by Michaela Corner, RN (Registered Nurse) on 11/05/22 at [phone]  Med List Status: <None>   Medication Order Taking? Sig Documenting Provider Last Dose Status Informant  aspirin 81 MG chewable tablet [id number]  Chew 81 mg by mouth daily. [provider]  Active Self  b complex vitamins capsule  [id number]  Take 1 capsule by mouth daily. [provider]  Active Self  carvedilol (COREG) 6.25 MG tablet [id number]  Take 1 tablet (6.25 mg total) by mouth 2 (two) times daily with a meal. Baglia, Corrina, PA-C  Active   Cholecalciferol (VITAMIN D3) 50 MCG (2000 UT) TABS [id number]  Take 2,000 Units by mouth daily. [provider]  Active Self  clopidogrel (PLAVIX) 75 MG tablet [id number]  Take 1 tablet (75 mg total) by mouth daily. Restart on Sunday 11/07/22 Graceann Congress, PA-C  Active   diclofenac Sodium (VOLTAREN) 1 % GEL [id number]  Apply 2 g topically daily as needed (pain). [provider]  Active Self  empagliflozin (JARDIANCE) 10 MG TABS tablet [id number]  Take 1 tablet (10 mg total) by mouth daily. Baglia, Corrina, PA-C  Active   isosorbide dinitrate (ISORDIL) 10 MG tablet [id number]  Take 10 mg by mouth 3 (three) times daily. [provider]  Active Self  magnesium oxide (MAG-OX) 400 (240 Mg) MG tablet [id number]  Take 400 mg by mouth at bedtime. [provider]  Active Self  oxyCODONE-acetaminophen (PERCOCET) 5-325 MG tablet [id number]  Take 1 tablet by mouth every 6 (six) hours as needed for severe pain. Baglia, Corrina, PA-C  Active   pantoprazole (PROTONIX) 40 MG tablet [id number]  TAKE 1 TABLET (40 MG TOTAL) BY MOUTH DAILY.  Patient taking differently: Take 40 mg by mouth at bedtime.   Plotnikov, Georgina Quint, MD  Active Self  rosuvastatin (CRESTOR) 20 MG tablet [id number]  Take 10 mg by mouth daily. [provider]  Active Self  sacubitril-valsartan (ENTRESTO) 24-26 MG [id number]  Take 1 tablet by mouth 2 (two) times daily. Baglia, Corrina, PA-C  Active   Semaglutide,0.25 or 0.5MG /DOS, (OZEMPIC, 0.25 OR 0.5 MG/DOSE,) 2 MG/1.5ML SOPN [id number]  Inject 0.5 mg into the skin once a week. If unavailable, please recommend alternative.  Patient taking differently: Inject 0.5 mg into the skin once a week. If unavailable, please recommend  alternative. SAT   Romero Belling, MD  Active Self  spironolactone (ALDACTONE) 25 MG tablet [id number]  Take 25 mg by mouth daily.  [provider]  Active Self            Home Care and Equipment/Supplies: Were Home Health Services Ordered?: Yes Name of Home Health Agency:: [facility] Has Agency set up a time to come to your home?: Yes First Home Health Visit Date: 11/05/22 Any new equipment or medical supplies ordered?: No  Functional Questionnaire: Do you need assistance with bathing/showering or dressing?: No Do you need assistance with meal preparation?: No Do you need assistance with eating?: No Do you have difficulty maintaining continence: No Do you need assistance with getting out of bed/getting out of a chair/moving?: No Do you have difficulty managing or taking your medications?: No (wife assists as indicated)  Follow up appointments reviewed: PCP Follow-up appointment confirmed?: NA (verified not indicated per hospital discharging provider discharge notes) Specialist Hospital Follow-up appointment confirmed?: Yes Date of Specialist follow-up appointment?: 11/16/22 Follow-Up Specialty Provider:: cardiology/ CHF provider-- patient has planned carotid procedure scheduled for 11/17/22 Do you need transportation to your follow-up appointment?: No Do you understand care options if your condition(s) worsen?: Yes-patient verbalized understanding  SDOH Interventions Today    Flowsheet Row Most Recent Value  SDOH Interventions   Food Insecurity Interventions Intervention Not Indicated  Transportation Interventions Intervention Not Indicated  [normally drives self,  wife assisting post-recent surgical/ ICD procedures]      TOC Interventions Today    Flowsheet Row Most Recent Value  TOC Interventions   TOC Interventions Discussed/Reviewed TOC Interventions Discussed, S/S of infection, Post op wound/incision care  [provided my direct contact information should  questions/ concerns/ needs arise post-TOC call, prior to RN CM telephone visit 11/25/22,  thoroughly reviewed post- discharge/ post-ICD instructions- pt has numerous very good questions]      Interventions Today    Flowsheet Row Most Recent Value  Chronic Disease   Chronic disease during today's visit Other  [cardiac arrest with ICD placement after planned carotid surgical procedure]  General Interventions   General Interventions Discussed/Reviewed General Interventions Discussed, Referral to Nurse, Doctor Visits  [scheduled with RN CM Care Coordinator for follow up call 11/25/22 after patient has next (planned) carotid procedure on 11/17/22]  Doctor Visits Discussed/Reviewed Doctor Visits Discussed, Specialist, PCP  PCP/Specialist Visits Compliance with follow-up visit  Education Interventions   Education Provided Provided Education  Provided Verbal Education On Other, When to see the doctor, Medication  [reinforced medication purpose/ changes,  role of home health services,  sharing information with [facility] from [facility]CP [facility],  need to discuss resumption of caffeine use with cardiology provider]  Nutrition Interventions   Nutrition Discussed/Reviewed Nutrition Discussed  Pharmacy Interventions   Pharmacy Dicussed/Reviewed Pharmacy Topics Discussed  [Full medication review with updating medication list in EHR per patient report]      Caryl Pina, RN, BSN, CCRN Alumnus RN CM Care Coordination/ Transition of Care- [facility] Care Management [phone]: direct office

## 2022-11-15 NOTE — Progress Notes (Signed)
HEART & VASCULAR TRANSITION OF CARE CONSULT NOTE     Referring Physician: Primary Care: Plotnikov, Georgina Quint, MD Primary Cardiologist: Dr. Nahser/ [facility]  EP: Dr. Elberta Fortis  HPI: Referred to clinic by Dr. Bjorn Pippin for heart failure consultation.   [name] is a 67 y.o.. male with a history of CAD s/p CABG x4 in 01/2002, chronic systolic heart failure, carotid artery disease s/p right CEA and patch angioplasty in 11/2016, PAD s/p right iliofemoral artery endarterectomy in May and patch angioplasty as well as stenting of bilateral common iliac arteries in 04/2021, hypertension, hyperlipidemia, type 2 diabetes mellitus, TIA and new diagnosis of VT arrest.  Admitted 4/24 for planned planned right transcarotid artery revascularization, suffered VT arrest after sedation and intubation. Surgery aborted, underwent CPR and shock x 1 with ROSC. Post-arrest EKG showed sinus bradycardia, rate 55 bpm, with Q waves in lead III and minimal ST depression in leads V4-V6 as well as nonspecific T wave changes in inferior leads and 1 and aVL. He was transferred to ICU, post arrest echo showed EF < 20%, RV moderately down. Underwent L/RHC showing severe native vessel coronary artery disease with total occlusion of the proximal LAD, subtotal occlusion of the proximal and mid circumflex, total occlusion of the distal circumflex, and total occlusion of the midportion of the nondominant RCA; s/p aortocoronary bypass surgery with continued patency of the RIMA to LAD and free LIMA to diagonal, total occlusion of the saphenous vein graft to OM 2 and saphenous vein graft to left PDA. CO/CI 4.75/2.12, mild pulm htn with mean PA 27, PVR 2.5 WU. He was able to be extubated and remained neurologically intact. cMRI showed LVEF 23%, RVEF 54%, LGE consistent with infarct in basal to mid inferior wall and basal to apical lateral wall. EP consulted and underwent ICD implantation for secondary prevention. Interestingly,  he had seen Dr. Graciela Husbands in the past for ICD consideration but patient declined ICD. He was started on amiodarone to help bridge him through his procedures, with plans to stop in 2-3 months. He was started on GDMT and discharged home, weight 215 lbs.  Today he presents to [facility] for post hospital HF follow up. Overall feeling fine, NYHA Class II. Denies increasing SOB, CP, dizziness, edema, or PND/Orthopnea. Wt stable. No LEE. Appetite ok. No fever or chills. Taking all medications, tolerating well w/o side effects. BP 110/70. No further VT on device interrogation. Corvue data not yet available.    Cardiac Testing   - cMRI (4/24): LVEF 23%, RVEF 54%, LGE consistent with infarct in basal to mid inferior wall and basal to apical lateral wall.  - R/LHC (4/24): severe native vessel coronary artery disease with total occlusion of the proximal LAD, subtotal occlusion of the proximal and mid circumflex, total occlusion of the distal circumflex, and total occlusion of the midportion of the nondominant RCA; s/p aortocoronary bypass surgery with continued patency of the RIMA to LAD and free LIMA to diagonal, total occlusion of the saphenous vein graft to OM 2 and saphenous vein graft to left PDA.  CO/CI 4.75/2/12, mild pulm htn with mean PA 27, PVR 2.5 WU. mRA 9, mPCWP 15   - Echo, post arrest (4/24): EF < 20%, RV moderately down  - Echo (2015): 30-35%  Review of Systems: [y] = yes, [ ]  = no   General: Weight gain [ ] ; Weight loss [ ] ; Anorexia [ ] ; Fatigue [ ] ; Fever [ ] ; Chills [ ] ; Weakness [ ]   Cardiac: Chest pain/pressure [ ] ; Resting SOB [ ] ; Exertional SOB [Y ]; Orthopnea [ ] ; Pedal Edema [ ] ; Palpitations [ ] ; Syncope [ ] ; Presyncope [ ] ; Paroxysmal nocturnal dyspnea[ ]   Pulmonary: Cough [ ] ; Wheezing[ ] ; Hemoptysis[ ] ; Sputum [ ] ; Snoring [ ]   GI: Vomiting[ ] ; Dysphagia[ ] ; Melena[ ] ; Hematochezia [ ] ; Heartburn[ ] ; Abdominal pain [ ] ; Constipation [ ] ; Diarrhea [ ] ; BRBPR [ ]   GU: Hematuria[ ] ;  Dysuria [ ] ; Nocturia[ ]   Vascular: Pain in legs with walking [ ] ; Pain in feet with lying flat [ ] ; Non-healing sores [ ] ; Stroke [ ] ; TIA [ ] ; Slurred speech [ ] ;  Neuro: Headaches[ ] ; Vertigo[ ] ; Seizures[ ] ; Paresthesias[ ] ;Blurred vision [ ] ; Diplopia [ ] ; Vision changes [ ]   Ortho/Skin: Arthritis [ ] ; Joint pain [ ] ; Muscle pain [ ] ; Joint swelling [ ] ; Back Pain [ ] ; Rash [ ]   Psych: Depression[ ] ; Anxiety[ ]   Heme: Bleeding problems [ ] ; Clotting disorders [ ] ; Anemia [ ]   Endocrine: Diabetes [ Y]; Thyroid dysfunction[ ]   Past Medical History:  Diagnosis Date   AICD (automatic cardioverter/defibrillator) present 11/03/2022   Aortic valve sclerosis    aortic valve sclerosis   Arthritis    CAD (coronary artery disease)    Myoview 5/18: 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 no ischemia EF 27%   Carotid artery disease (HCC)    chronic total occlusion LCCA, retrograde flow from LECA to LICA, ,, 0-39%LICA, s/p Right CEA 2018   CHF (congestive heart failure) (HCC)    Closed head injury with concussion    x2   Complication of anesthesia    Low blood pressure after colonoscopy   Diabetes mellitus without complication (HCC)    Dizziness    Ejection fraction < 50%    EF 40%, echo 2006, no ICD needed  / EF 40%, echo, 11/2009   Former cigarette smoker    Hx of CABG 01/2002   01/2002   Hyperlipidemia    Low HDL   Hypertension    Mitral regurgitation    mild, echo, 11/2009   Myocardial infarction [facility])    "they told me i had a silent heart attack"   Peripheral vascular disease (HCC)    Dr. Georganna Skeans   Sleep disorder    Dr Shelle Iron, lose weight and consider sleep study later   Sleep disorder    TIA (transient ischemic attack)    by history   Volume overload    Current Outpatient Medications  Medication Sig Dispense Refill   aspirin 81 MG chewable tablet Chew 81 mg by mouth daily.     b complex vitamins capsule Take 1 capsule by mouth daily.     carvedilol (COREG)  6.25 MG tablet Take 1 tablet (6.25 mg total) by mouth 2 (two) times daily with a meal. 60 tablet 2   Cholecalciferol (VITAMIN D3) 50 MCG (2000 UT) TABS Take 2,000 Units by mouth daily.     clopidogrel (PLAVIX) 75 MG tablet Take 1 tablet (75 mg total) by mouth daily. Restart on Sunday 11/07/22 30 tablet 6   diclofenac Sodium (VOLTAREN) 1 % GEL Apply 2 g topically daily as needed (pain).     empagliflozin (JARDIANCE) 10 MG TABS tablet Take 1 tablet (10 mg total) by mouth daily. 30 tablet 3   isosorbide dinitrate (ISORDIL) 10 MG tablet Take 10 mg by mouth 3 (three) times daily.     magnesium oxide (  MAG-OX) 400 (240 Mg) MG tablet Take 400 mg by mouth at bedtime.     oxyCODONE-acetaminophen (PERCOCET) 5-325 MG tablet Take 1 tablet by mouth every 6 (six) hours as needed for severe pain. 10 tablet 0   pantoprazole (PROTONIX) 40 MG tablet TAKE 1 TABLET (40 MG TOTAL) BY MOUTH DAILY. (Patient taking differently: Take 40 mg by mouth at bedtime.) 90 tablet 0   rosuvastatin (CRESTOR) 20 MG tablet Take 10 mg by mouth daily.     sacubitril-valsartan (ENTRESTO) 24-26 MG Take 1 tablet by mouth 2 (two) times daily. 60 tablet 3   Semaglutide,0.25 or 0.5MG /DOS, (OZEMPIC, 0.25 OR 0.5 MG/DOSE,) 2 MG/1.5ML SOPN Inject 0.5 mg into the skin once a week. If unavailable, please recommend alternative. (Patient taking differently: Inject 0.5 mg into the skin once a week. If unavailable, please recommend alternative. SAT) 1.5 mL 3   spironolactone (ALDACTONE) 25 MG tablet Take 25 mg by mouth daily.      No current facility-administered medications for this encounter.   No Known Allergies  Social History   Socioeconomic History   Marital status: Married    Spouse name: Clydie Braun   Number of children: 2   Years of education: Not on file   Highest education level: High school graduate  Occupational History   Occupation: Retired  Tobacco Use   Smoking status: Former    Types: Cigarettes    Quit date: 05/05/2002    Years  since quitting: 20.5    Passive exposure: Never   Smokeless tobacco: Never  Vaping Use   Vaping Use: Never used  Substance and Sexual Activity   Alcohol use: Yes    Alcohol/week: 0.0 standard drinks of alcohol    Comment: beer every now and then   Drug use: No   Sexual activity: Yes  Other Topics Concern   Not on file  Social History Narrative   No Regular exercise   Social Determinants of Health   Financial Resource Strain: Low Risk  (11/01/2022)   Overall Financial Resource Strain (CARDIA)    Difficulty of Paying Living Expenses: Not hard at all  Food Insecurity: No Food Insecurity (11/05/2022)   Hunger Vital Sign    Worried About Running Out of Food in the Last Year: Never true    Ran Out of Food in the Last Year: Never true  Transportation Needs: No Transportation Needs (11/05/2022)   PRAPARE - Administrator, Civil Service (Medical): No    Lack of Transportation (Non-Medical): No  Physical Activity: Insufficiently Active (07/06/2022)   Exercise Vital Sign    Days of Exercise per Week: 5 days    Minutes of Exercise per Session: 20 min  Stress: No Stress Concern Present (07/06/2022)   [employer] of Occupational Health - Occupational Stress Questionnaire    Feeling of Stress : Not at all  Social Connections: Socially Integrated (07/06/2022)   Social Connection and Isolation Panel [NHANES]    Frequency of Communication with Friends and Family: More than three times a week    Frequency of Social Gatherings with Friends and Family: Once a week    Attends Religious Services: More than 4 times per year    Active Member of [employer] or Organizations: Yes    Attends Banker Meetings: More than 4 times per year    Marital Status: Married  Catering manager Violence: Not At Risk (11/02/2022)   Humiliation, Afraid, Rape, and Kick questionnaire    Fear of Current or Ex-Partner:  No    Emotionally Abused: No    Physically Abused: No    Sexually Abused: No    Family History  Problem Relation Age of Onset   Stroke Mother    Diabetes Father    COPD Father    Arthritis Father    Heart disease Father    Coronary artery disease Other        Male 1st degree relative <50   Diabetes Other        1st degree relative   Stroke Other        Male 1st degree relative<50   Deep vein thrombosis Daughter    Pulmonary fibrosis Daughter    Vitals:   11/16/22 1447  BP: 110/70  Pulse: 60  SpO2: 97%  Weight: 98.2 kg (216 lb 6.4 oz)    PHYSICAL EXAM: General:  Well appearing. No respiratory difficulty HEENT: normal Neck: supple. no JVD. Carotids 2+ bilat; no bruits. No lymphadenopathy or thyromegaly appreciated. Cor: PMI nondisplaced. Regular rate & rhythm. No rubs, gallops or murmurs. Device pocket left upper chest ok  Lungs: clear Abdomen: soft, nontender, nondistended. No hepatosplenomegaly. No bruits or masses. Good bowel sounds. Extremities: no cyanosis, clubbing, rash, edema Neuro: alert & oriented x 3, cranial nerves grossly intact. moves all 4 extremities w/o difficulty. Affect pleasant.  ECG (personally reviewed): Sinus bradycardia, 56 bpm, incomplete LBBB, QRS 120 ms   ASSESSMENT & PLAN:  Chronic Systolic Heart Failure - long standing history, 2/2 ischemic CM. EF chronically low, ranging from 20-40% over last several years - recent echo 4/24 EF < 20%, RV moderately reduced (post VT arrest)  - LHC 4/24 w/ 2/4 patent grafts (see below). RHC w/ normal filling pressures and marginal CO w/ CI 2.12   - cMRI 4/24 EF 23% + LGE in basal to mid inferior wall and basal to apical lateral wall.  - s/p ICD 11/03/22 - NYHA II - Volume stable. Euvolemic on exam. Does not need loop diuretic  - on 4 Pillar GDMT.  Ideally, would try to further optimize and titrate up Entresto. However, he is scheduled to go back to the OR tomorrow. Will hold off on further dose titration today and will reassess post-operatively.  - Continue Jardiance 10 mg daily  -  Continue Entresto 24-26 mg daily. He took a dose this morning. Advised that he hold tonight's dose and tomorrow morning ahead of OR to minimize risk of hypotension/ perioperative vasoplegia  - Continue Spironolactone 25 mg daily  - Continue Coreg 6.25 mg bid. Bradycardia limits dose titration  - Continue Isordil 10 mg tid  - We discussed referral to the [facility]. He is currently NYHA Class II. EF has not improved despite good GDMT. RV was moderately reduced on recent echo but this was post arrest. I discussed treatment offerings at the [facility] including LVAD and he would be interested in this if clinical condition worsened, though this would depend on RV function and would also require re-do sternotomy. Otherwise, he would be a good candidate. Will refer to the [facility] and will follow trajectory of HF/RV. He has [employer] w/ the [place]. He is in agreement w/ this.    CAD - s/p CABG x4 (LIMA as a free graft to the diagonal, RIMA to LAD, SVG to OM 2, and SVG to PDA) in 01/2002 with Dr. Dorris Fetch  - [facility] 4/24 showed severe native vessel coronary artery disease with total occlusion of the proximal LAD and subtotal occlusion of the proximal and  mid circumflex, total occlusion of the distal circumflex, and total occlusion of the midportion of the nondominant RCA, continued patency of the RIMA to LAD and free LIMA to diagonal.  Total occlusion of the saphenous vein graft to OM 2 and saphenous vein graft to left PDA. No targets for intervention  - stable w/o CP  - continue medical management (on ASA, Plavix, Statin + ? blocker)   VT arrest - long history of chronic systolic heart failure, previously declined ICD for primary prevention  - VT arrest 4/24. Cath per above (patent RIMA-LAD and LIMA- Diag, occluded SVG- LCx and SVG - RCA)  - cMRI + for subendocardial late gadolinium enhancement consistent with infarct in basal to mid inferior wall and basal to apical lateral wall. LGE is greater than 50% transmural  suggesting inferior and lateral walls are nonviable. LVEF 23% - Suspect his VT arrest occurred due to scar from his ischemic cardiomyopathy - s/p ICD placement on 5/1 for secondary prevention. Followed by Dr. Elberta Fortis.  - No VT on today's device interrogation  - No driving x 6 months    PVCs/NSVT - s/p ICD  - SB on EKG today , 56 bpm. No PVCs  - Continue Coreg   5. 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 Aborted attempt at rt trans carotid revascularization 10/29/22 per above  - VVS planning repeat attempt at Right Transcarotid revascularization on 11/17/22  - on ASA, Plavix + statin   6. Type 2DM - controlled, Hgb A1c 6.5 - continue  Jardiance - continue  Ozempic    Referred to HFSW (PCP, Medications, Transportation, ETOH Abuse, Drug Abuse, Insurance, Financial ):  No Refer to Pharmacy: No Refer to Home Health: No Refer to Advanced Heart Failure Clinic: Yes (Assign to Dr. Gasper Lloyd) Refer to General Cardiology: Yes ([facility]/ [facility] and [facility] Elberta Fortis)   Follow up  w/ Dr. Gasper Lloyd in 3 wks   Robbie Lis, PA-C 11/16/2022

## 2022-11-16 ENCOUNTER — Encounter (HOSPITAL_COMMUNITY): Payer: Self-pay | Admitting: Vascular Surgery

## 2022-11-16 ENCOUNTER — Ambulatory Visit (HOSPITAL_BASED_OUTPATIENT_CLINIC_OR_DEPARTMENT_OTHER)
Admit: 2022-11-16 | Discharge: 2022-11-16 | Disposition: A | Discharge status: Home / Self Care | Payer: Medicare Other | Attending: Family Medicine | Admitting: Family Medicine

## 2022-11-16 ENCOUNTER — Other Ambulatory Visit: Payer: Self-pay

## 2022-11-16 ENCOUNTER — Encounter: Payer: Self-pay | Admitting: Cardiology

## 2022-11-16 VITALS — BP 110/70 | HR 60 | Wt 216.4 lb

## 2022-11-16 DIAGNOSIS — Z8679 Personal history of other diseases of the circulatory system: Secondary | ICD-10-CM | POA: Diagnosis not present

## 2022-11-16 DIAGNOSIS — I2510 Atherosclerotic heart disease of native coronary artery without angina pectoris: Secondary | ICD-10-CM | POA: Insufficient documentation

## 2022-11-16 DIAGNOSIS — I5022 Chronic systolic (congestive) heart failure: Secondary | ICD-10-CM

## 2022-11-16 DIAGNOSIS — Z87891 Personal history of nicotine dependence: Secondary | ICD-10-CM | POA: Insufficient documentation

## 2022-11-16 DIAGNOSIS — E1151 Type 2 diabetes mellitus with diabetic peripheral angiopathy without gangrene: Secondary | ICD-10-CM | POA: Insufficient documentation

## 2022-11-16 DIAGNOSIS — Z79899 Other long term (current) drug therapy: Secondary | ICD-10-CM | POA: Insufficient documentation

## 2022-11-16 DIAGNOSIS — Z95810 Presence of automatic (implantable) cardiac defibrillator: Secondary | ICD-10-CM | POA: Insufficient documentation

## 2022-11-16 DIAGNOSIS — Z7902 Long term (current) use of antithrombotics/antiplatelets: Secondary | ICD-10-CM | POA: Insufficient documentation

## 2022-11-16 DIAGNOSIS — Z7984 Long term (current) use of oral hypoglycemic drugs: Secondary | ICD-10-CM | POA: Insufficient documentation

## 2022-11-16 DIAGNOSIS — I272 Pulmonary hypertension, unspecified: Secondary | ICD-10-CM | POA: Insufficient documentation

## 2022-11-16 DIAGNOSIS — I252 Old myocardial infarction: Secondary | ICD-10-CM | POA: Insufficient documentation

## 2022-11-16 DIAGNOSIS — Z7982 Long term (current) use of aspirin: Secondary | ICD-10-CM | POA: Insufficient documentation

## 2022-11-16 DIAGNOSIS — I447 Left bundle-branch block, unspecified: Secondary | ICD-10-CM | POA: Insufficient documentation

## 2022-11-16 DIAGNOSIS — I25810 Atherosclerosis of coronary artery bypass graft(s) without angina pectoris: Secondary | ICD-10-CM | POA: Insufficient documentation

## 2022-11-16 DIAGNOSIS — E785 Hyperlipidemia, unspecified: Secondary | ICD-10-CM | POA: Insufficient documentation

## 2022-11-16 DIAGNOSIS — I110 Hypertensive heart disease with heart failure: Secondary | ICD-10-CM | POA: Insufficient documentation

## 2022-11-16 DIAGNOSIS — I255 Ischemic cardiomyopathy: Secondary | ICD-10-CM | POA: Insufficient documentation

## 2022-11-16 DIAGNOSIS — Z8673 Personal history of transient ischemic attack (TIA), and cerebral infarction without residual deficits: Secondary | ICD-10-CM | POA: Insufficient documentation

## 2022-11-16 DIAGNOSIS — I472 Ventricular tachycardia, unspecified: Secondary | ICD-10-CM | POA: Insufficient documentation

## 2022-11-16 NOTE — Progress Notes (Signed)
PERIOPERATIVE PRESCRIPTION FOR IMPLANTED CARDIAC DEVICE PROGRAMMING  Patient Information: Name:  [name]  DOB:  1956-02-09  MRN:  [id number]    Planned Procedure: Right Transcarotid Artery Revascubrization  Surgeon:  Wille Celeste  Date of Procedure:  11/17/22  Cautery will be used.  Position during surgery:  supine   Please send documentation back to:  [facility] (Fax # [phone])  Device Information:  Clinic EP Physician:  Loman Brooklyn, MD   Device Type:  Defibrillator Manufacturer and Phone #:  Medtronic: [phone] Pacemaker Dependent?:  No. Date of Last Device Check:  11/03/22 Normal Device Function?:  Yes.    Electrophysiologist's Recommendations:  Have magnet available. Provide continuous ECG monitoring when magnet is used or reprogramming is to be performed.  Procedure may interfere with device function.  Magnet should be placed over device during procedure.  Per Device Clinic Standing Orders, Lenor Coffin, RN  9:44 AM 11/16/2022

## 2022-11-16 NOTE — Progress Notes (Addendum)
Mr. Thelen denies chest pain or shortness of breath. Patient denies having any s/s of Covid in his household, also denies any known exposure to Covid.  Mr. Ringstaff any s/s of upper or lower respiratory in the past 8 weeks.   Mr. Cayson PCP is Dr. Alain Honey, cardiologist is Dr. Melburn Popper.   Mr. Lammert has type II diabetes, patient has held Ozempic since 11/06/22.  Mr. Selbe has continued to take Jardiance, last dose was today. I instructed patient to check CBG after awaking and every 2 hours until arrival  to the hospital. I Instructed Mr. [place] if CBG is less than 70 to take 4 Glucose Tablets or 1 tube of Glucose Gel or 1/2 cup of a clear juice. Recheck CBG in 15 minutes if CBG is not over 70 call, pre- op desk at [phone] for further instructions.Mr. Mischler has an appointment at Heart Failure Clinic this after noon.  I notified care coordinotor, Mrs. Emelda Fear of Mr Hiltunen's arrival in am and that he has a Medtronic ICD, I also notified Tobie Lords

## 2022-11-16 NOTE — Progress Notes (Signed)
I sent a request for PreOP device orders for [name].

## 2022-11-16 NOTE — Discharge Summary (Signed)
Carotid Discharge Summary     [name] 23-Aug-1955 67 y.o. male  [id number]  Admission Date: 10/28/2022  Discharge Date: 11-08-2022  Physician: No att. providers found  Admission Diagnosis: Cardiac arrest [facility]) [I46.9] Carotid stenosis [I65.29]  Discharge Day services:    ICD implant  Hospital Course: The patient was admitted to the hospital and taken to the operating room on 11/17/2022 and underwent aborted right transcarotid artery revascularization by Dr. Lenell Antu for symptomatic carotid stenosis. Unfortunately during placement of the flow reversal sheath in the left common femoral vein the patient entered into ventricular tachycardia. The wire was withdrawn but he remained in the arrhythmia. ACLS was initiated with 1 shock with ROSC. At this time the case was aborted. He went back into ventricular tachycardia and again ACLS was restarted with need for an additional shock. ROSC was again obtained. He was started on Epinephrine and Neo- Synephrine drip in OR and transferred to the ICU. Cardiology and critical care were consulted for assistance of management. He remained stable for extubation later post operatively. He tolerated this well  By POD#1, remained neurologically intact. Overall stable without any chest pain or shortness of breath. Some chest soreness due to CPR. Taken for cardiac catheterization which showed severe multivessel coronary artery disease no amenable to angioplasty. Medical management recommended. Echo showing worsening EF (<20%) compared to his prior. Cardiac MRI also ordered. Was able to wean off of pressors. His home heart failure medications slowly restarted. He otherwise remained hemodynamically stable. Tolerating sitting up in chair and ambulating  The remainder of his hospital stay consisted of continued cardiac monitoring. Re initiation of his chronic heart failure medications, neuro checks, increased mobilization, and evaluation by the electrophysiologists  for ICD placement.   POD#4, he was evaluated for ICD placement and he was felt to meet criteria for ICD implantation for secondary prevention of sudden death  On Nov 08, 2022 he had placement of ICD. He tolerated this procedure well. He remained otherwise stable for discharge home. PDMP was reviewed and post operative pain medication was sent to  his pharmacy. He was also sent new prescription for Entresto with dose change to 24/26 mg. He will continue on carvedilol 6.25 mg BID, Spironolactone 25 mg daily, Jardiance 10 mg daily. He will continue Aspirin and Plavix. We will arrange right Transcarotid revascularization on 11/17/22. Patient given instructions on restarting his Plavix on 11/07/22. Reviewed signs and symptoms of TIA and stroke and he and his wife understood that should these occur he needs to go to ER immediately.   No results for input(s): "NA", "K", "CL", "CO2", "GLUCOSE", "BUN", "CALCIUM" in the last 72 hours.  Invalid input(s): "CRATININE" No results for input(s): "WBC", "HGB", "HCT", "PLT" in the last 72 hours. No results for input(s): "INR" in the last 72 hours.   Discharge Instructions     Call MD for:  redness, tenderness, or signs of infection (pain, swelling, bleeding, redness, odor or green/yellow discharge around incision site)   Complete by: As directed    Call MD for:  severe or increased pain, loss or decreased feeling  in affected limb(s)   Complete by: As directed    Call MD for:  temperature >100.5   Complete by: As directed    Discharge instructions   Complete by: As directed    Please restart your Plavix on 11/07/22   Driving Restrictions   Complete by: As directed    No driving  while taking narcotic pain medication   Increase activity slowly  Complete by: As directed    Walk with assistance use walker or cane as needed   Lifting restrictions   Complete by: As directed    No heavy lifting, pushing, pulling > 10 lbs for 2 weeks   Resume previous diet   Complete  by: As directed    may wash over wound with mild soap and water   Complete by: As directed        Discharge Diagnosis:  Cardiac arrest [facility]) [I46.9] Carotid stenosis [I65.29]  Secondary Diagnosis: Patient Active Problem List   Diagnosis Date Noted   Cardiac arrest (HCC) 10/28/2022   Carotid stenosis 10/28/2022   Acute on chronic combined systolic and diastolic CHF (congestive heart failure) (HCC) 10/28/2022   Osteoarthritis 06/30/2021   PAD (peripheral artery disease) (HCC) 04/29/2021   Acute bursitis of left shoulder 02/01/2018   Upper respiratory infection 12/30/2017   Right rotator cuff tear 12/29/2017   Cramps, extremity 01/20/2017   Asymptomatic stenosis of right carotid artery 11/05/2016   Hearing loss d/t noise 08/08/2015   Shoulder pain, right 08/08/2015   Chest wall pain 09/13/2014   Acute upper respiratory infection 05/15/2014   Obesity 05/15/2014   Ischemic cardiomyopathy 11/09/2013   Chronic systolic CHF (congestive heart failure) (HCC) 11/09/2013   Discoloration of skin 05/04/2013   Nocturnal leg cramps 05/04/2013   PVC's (premature ventricular contractions) 03/30/2013   Dizziness    CAD (coronary artery disease)    Diabetes mellitus type 2 in obese 02/09/2013   Cough 06/14/2012   Neoplasm of uncertain behavior of skin 01/03/2012   Well adult exam 12/13/2011   Hypertension    Claudication (HCC)    TIA (transient ischemic attack)    Aortic valve sclerosis    Ejection fraction < 50%    Hyperlipidemia    Volume overload    Sleep disorder    Mitral regurgitation    BRONCHITIS 05/15/2010   KNEE PAIN 05/15/2010   Actinic keratosis 01/19/2010   NECK PAIN 01/19/2010   LEG PAIN 09/30/2009   SKIN RASH 09/30/2009   SNORING 01/15/2009   ABNORMAL LABS 01/15/2009   OTITIS MEDIA, SEROUS 05/01/2008   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   Past Medical History:  Diagnosis Date   Aortic valve sclerosis    aortic valve sclerosis   Arthritis    CAD (coronary artery  disease)    Myoview 5/18: 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 no ischemia EF 27%   Carotid artery disease (HCC)    chronic total occlusion LCCA, retrograde flow from LECA to LICA, ,, 0-39%LICA, s/p Right CEA 2018   CHF (congestive heart failure) (HCC)    Closed head injury with concussion    x2   Complication of anesthesia    Low blood pressure after colonoscopy   Diabetes mellitus without complication (HCC)    Dizziness    Ejection fraction < 50%    EF 40%, echo 2006, no ICD needed  / EF 40%, echo, 11/2009   Former cigarette smoker    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 Georganna Skeans   Sleep disorder    Dr Shelle Iron, lose weight and consider sleep study later   Sleep disorder  TIA (transient ischemic attack)    by history   Volume overload     Allergies as of 11/03/2022   No Known Allergies      Medication List     STOP taking these medications    linagliptin 5 MG Tabs tablet Commonly known as: Tradjenta   sacubitril-valsartan 97-103 MG Commonly known as: ENTRESTO Replaced by: Sherryll Burger 24-26 MG       TAKE these medications    aspirin 81 MG chewable tablet Chew 81 mg by mouth daily.   b complex vitamins capsule Take 1 capsule by mouth daily.   carvedilol 6.25 MG tablet Commonly known as: COREG Take 1 tablet (6.25 mg total) by mouth 2 (two) times daily with a meal. What changed:  medication strength how much to take   clopidogrel 75 MG tablet Commonly known as: PLAVIX Take 1 tablet (75 mg total) by mouth daily. Restart on Sunday 11/07/22 What changed: additional instructions   diclofenac Sodium 1 % Gel Commonly known as: VOLTAREN Apply 2 g topically daily as needed (pain).   Entresto 24-26 MG Generic drug: sacubitril-valsartan Take 1  tablet by mouth 2 (two) times daily. Replaces: sacubitril-valsartan 97-103 MG   isosorbide dinitrate 10 MG tablet Commonly known as: ISORDIL Take 10 mg by mouth 3 (three) times daily.   Jardiance 10 MG Tabs tablet Generic drug: empagliflozin Take 1 tablet (10 mg total) by mouth daily. What changed:  medication strength how much to take   magnesium oxide 400 (240 Mg) MG tablet Commonly known as: MAG-OX Take 400 mg by mouth at bedtime.   oxyCODONE-acetaminophen 5-325 MG tablet Commonly known as: Percocet Take 1 tablet by mouth every 6 (six) hours as needed for severe pain.   Ozempic (0.25 or 0.5 MG/DOSE) 2 MG/1.5ML Sopn Generic drug: Semaglutide(0.25 or 0.5MG /DOS) Inject 0.5 mg into the skin once a week. If unavailable, please recommend alternative. What changed: additional instructions   pantoprazole 40 MG tablet Commonly known as: PROTONIX TAKE 1 TABLET (40 MG TOTAL) BY MOUTH DAILY. What changed: when to take this   rosuvastatin 20 MG tablet Commonly known as: CRESTOR Take 10 mg by mouth daily.   spironolactone 25 MG tablet Commonly known as: ALDACTONE Take 25 mg by mouth daily.   Vitamin D3 50 MCG (2000 UT) Tabs Take 2,000 Units by mouth daily.         Discharge Instructions:   [facility] Discharge Instructions Carotid Endarterectomy (CEA)  Please refer to the following instructions for your post-procedure care. Your surgeon or physician assistant will discuss any changes with you.  Activity  You are encouraged to walk as much as you can. You can slowly return to normal activities but must avoid strenuous activity and heavy lifting until your doctor tell you it's OK. Avoid activities such as vacuuming or swinging a golf club. You can drive after one week if you are comfortable and you are no longer taking prescription pain medications. It is normal to feel tired for serval weeks after your surgery. It is also normal to have  difficulty with sleep habits, eating, and bowel movements after surgery. These will go away with time.  Bathing/Showering  You may shower after you come home. Do not soak in a bathtub, hot tub, or swim until the incision heals completely.  Incision Care  Shower every day. Clean your incision with mild soap and water. Pat the area dry with a clean towel. You do not need a bandage unless  otherwise instructed. Do not apply any ointments or creams to your incision. You may have skin glue on your incision. Do not peel it off. It will come off on its own in about one week. Your incision may feel thickened and raised for several weeks after your surgery. This is normal and the skin will soften over time. For Men Only: It's OK to shave around the incision but do not shave the incision itself for 2 weeks. It is common to have numbness under your chin that could last for several months.  Diet  Resume your normal diet. There are no special food restrictions following this procedure. A low fat/low cholesterol diet is recommended for all patients with vascular disease. In order to heal from your surgery, it is CRITICAL to get adequate nutrition. Your body requires vitamins, minerals, and protein. Vegetables are the best source of vitamins and minerals. Vegetables also provide the perfect balance of protein. Processed food has little nutritional value, so try to avoid this.  Medications  Resume taking all of your medications unless your doctor or physician assistant tells you not to.  If your incision is causing pain, you may take over-the- counter pain relievers such as acetaminophen (Tylenol). If you were prescribed a stronger pain medication, please be aware these medications can cause nausea and constipation.  Prevent nausea by taking the medication with a snack or meal. Avoid constipation by drinking plenty of fluids and eating foods with a high amount of fiber, such as fruits, vegetables, and grains. Do not  take Tylenol if you are taking prescription pain medications.  Follow Up  Our office will schedule a follow up appointment 2-3 weeks following discharge.  Please call us immediately for any of the following conditions  Increased pain, redness, drainage (pus) from your incision site. Fever of 101 degrees or higher. If you should develop stroke (slurred speech, difficulty swallowing, weakness on one side of your body, loss of vision) you should call 911 and go to the nearest emergency room.  Reduce your risk of vascular disease:  Stop smoking. If you would like help call [facility] at 1-800-QUIT-NOW (845 702 1959) or [place] at 548 451 3533. Manage your cholesterol Maintain a desired weight Control your diabetes Keep your blood pressure down  If you have any questions, please call the office at [phone].  Prescriptions given: Entresto 24-26 mg take twice daily #60 Three Refills Percocet 5-325 mg take as needed for pain #10 No refills  Disposition: home  Patient's condition: is Good  Follow up: Will reschedule right TCAR in 2-3 weeks   Kailah Pennel PA-C [facility] [phone]   --- For [facility] Registry use ---   Modified Rankin score at D/C (0-6): 0  IV medication needed for:  1. Hypertension: No 2. Hypotension: Yes  Post-op Complications: Yes  1. Post-op CVA or TIA: No  If yes: Event classification (right eye, left eye, right cortical, left cortical, verterobasilar, other): n/a  If yes: Timing of event (intra-op, <6 hrs post-op, >=6 hrs post-op, unknown): n/a  2. CN injury: No  If yes: CN not injuried   3. Myocardial infarction: No  If yes: Dx by (EKG or clinical, Troponin): n/a  4.  CHF: Yes  5.  Dysrhythmia (new): Yes  6. Wound infection: No  7. Reperfusion symptoms: No  8. Return to OR: No  If yes: return to OR for (bleeding, neurologic, other CEA incision, other): n/a  Discharge medications: Statin use:   Yes ASA use:  Yes  Beta blocker use:  Yes ACE-Inhibitor use:  Yes  ARB use:  Yes CCB use: No P2Y12 Antagonist use: Yes, [ X ] Plavix, [ ]  Plasugrel, [ ]  Ticlopinine, [ ]  Ticagrelor, [ ]  Other, [ ]  No for medical reason, [ ]  Non-compliant, [ ]  Not-indicated Anti-coagulant use:  No, [ ]  Warfarin, [ ]  Rivaroxaban, [ ]  Dabigatran,

## 2022-11-16 NOTE — Patient Instructions (Addendum)
No Labs done today.   No medication changes were made. Please continue all current medications as prescribed.  Your physician recommends that you schedule a follow-up appointment in: 3 weeks  If you have any questions or concerns before your next appointment please send [place] a message through [place] or call our office at [phone].    TO LEAVE A MESSAGE FOR THE NURSE SELECT OPTION 2, PLEASE LEAVE A MESSAGE INCLUDING: YOUR NAME DATE OF BIRTH CALL BACK NUMBER REASON FOR CALL**this is important as we prioritize the call backs  YOU WILL RECEIVE A CALL BACK THE SAME DAY AS LONG AS YOU CALL BEFORE 4:00 PM   Do the following things EVERYDAY: Weigh yourself in the morning before breakfast. Write it down and keep it in a log. Take your medicines as prescribed Eat low salt foods--Limit salt (sodium) to 2000 mg per day.  Stay as active as you can everyday Limit all fluids for the day to less than 2 liters   At the [facility], you and your health needs are our priority. As part of our continuing mission to provide you with exceptional heart care, we have created designated Provider Care Teams. These Care Teams include your primary Cardiologist (physician) and Advanced Practice Providers (APPs- Physician Assistants and Nurse Practitioners) who all work together to provide you with the care you need, when you need it.   You may see any of the following providers on your designated Care Team at your next follow up: Dr Arvilla Meres Dr Marca Ancona Dr. Marcos Eke, NP Robbie Lis, [place] [facility] [place], [place] Brynda Peon, NP Karle Plumber, PharmD   Please be sure to bring in all your medications bottles to every appointment.    Thank you for choosing [facility]

## 2022-11-17 ENCOUNTER — Other Ambulatory Visit: Payer: Self-pay

## 2022-11-17 ENCOUNTER — Inpatient Hospital Stay (HOSPITAL_COMMUNITY): Payer: Medicare Other

## 2022-11-17 ENCOUNTER — Encounter (HOSPITAL_COMMUNITY): Payer: Self-pay | Admitting: Vascular Surgery

## 2022-11-17 ENCOUNTER — Inpatient Hospital Stay (HOSPITAL_COMMUNITY)
Admission: RE | Admit: 2022-11-17 | Discharge: 2022-11-18 | DRG: 035 | Disposition: A | Discharge status: Home Health | Payer: Medicare Other | Attending: Vascular Surgery | Admitting: Vascular Surgery

## 2022-11-17 ENCOUNTER — Encounter (HOSPITAL_COMMUNITY)
Admission: RE | Disposition: A | Discharge status: Home / Self Care | Payer: Self-pay | Source: Home / Self Care | Attending: Vascular Surgery

## 2022-11-17 ENCOUNTER — Ambulatory Visit: Payer: Medicare Other

## 2022-11-17 DIAGNOSIS — I6521 Occlusion and stenosis of right carotid artery: Secondary | ICD-10-CM | POA: Diagnosis present

## 2022-11-17 DIAGNOSIS — I5022 Chronic systolic (congestive) heart failure: Secondary | ICD-10-CM | POA: Diagnosis present

## 2022-11-17 DIAGNOSIS — I2510 Atherosclerotic heart disease of native coronary artery without angina pectoris: Secondary | ICD-10-CM | POA: Diagnosis present

## 2022-11-17 DIAGNOSIS — Z7984 Long term (current) use of oral hypoglycemic drugs: Secondary | ICD-10-CM | POA: Diagnosis not present

## 2022-11-17 DIAGNOSIS — Z9049 Acquired absence of other specified parts of digestive tract: Secondary | ICD-10-CM | POA: Diagnosis not present

## 2022-11-17 DIAGNOSIS — E1165 Type 2 diabetes mellitus with hyperglycemia: Secondary | ICD-10-CM | POA: Diagnosis present

## 2022-11-17 DIAGNOSIS — I493 Ventricular premature depolarization: Secondary | ICD-10-CM | POA: Diagnosis present

## 2022-11-17 DIAGNOSIS — Z87891 Personal history of nicotine dependence: Secondary | ICD-10-CM | POA: Diagnosis not present

## 2022-11-17 DIAGNOSIS — Z7902 Long term (current) use of antithrombotics/antiplatelets: Secondary | ICD-10-CM | POA: Diagnosis not present

## 2022-11-17 DIAGNOSIS — Z955 Presence of coronary angioplasty implant and graft: Secondary | ICD-10-CM

## 2022-11-17 DIAGNOSIS — Z7982 Long term (current) use of aspirin: Secondary | ICD-10-CM

## 2022-11-17 DIAGNOSIS — H539 Unspecified visual disturbance: Secondary | ICD-10-CM | POA: Diagnosis present

## 2022-11-17 DIAGNOSIS — E1151 Type 2 diabetes mellitus with diabetic peripheral angiopathy without gangrene: Secondary | ICD-10-CM | POA: Diagnosis present

## 2022-11-17 DIAGNOSIS — Z79899 Other long term (current) drug therapy: Secondary | ICD-10-CM | POA: Diagnosis not present

## 2022-11-17 DIAGNOSIS — Z7985 Long-term (current) use of injectable non-insulin antidiabetic drugs: Secondary | ICD-10-CM | POA: Diagnosis not present

## 2022-11-17 DIAGNOSIS — Z8249 Family history of ischemic heart disease and other diseases of the circulatory system: Secondary | ICD-10-CM

## 2022-11-17 DIAGNOSIS — Z8673 Personal history of transient ischemic attack (TIA), and cerebral infarction without residual deficits: Secondary | ICD-10-CM

## 2022-11-17 DIAGNOSIS — I255 Ischemic cardiomyopathy: Secondary | ICD-10-CM | POA: Diagnosis present

## 2022-11-17 DIAGNOSIS — I110 Hypertensive heart disease with heart failure: Secondary | ICD-10-CM | POA: Diagnosis present

## 2022-11-17 DIAGNOSIS — Z95810 Presence of automatic (implantable) cardiac defibrillator: Secondary | ICD-10-CM

## 2022-11-17 DIAGNOSIS — Z006 Encounter for examination for normal comparison and control in clinical research program: Secondary | ICD-10-CM | POA: Diagnosis not present

## 2022-11-17 DIAGNOSIS — I509 Heart failure, unspecified: Secondary | ICD-10-CM | POA: Diagnosis not present

## 2022-11-17 DIAGNOSIS — Z833 Family history of diabetes mellitus: Secondary | ICD-10-CM

## 2022-11-17 DIAGNOSIS — I252 Old myocardial infarction: Secondary | ICD-10-CM

## 2022-11-17 DIAGNOSIS — I472 Ventricular tachycardia, unspecified: Secondary | ICD-10-CM | POA: Diagnosis present

## 2022-11-17 DIAGNOSIS — E785 Hyperlipidemia, unspecified: Secondary | ICD-10-CM | POA: Diagnosis present

## 2022-11-17 DIAGNOSIS — I6529 Occlusion and stenosis of unspecified carotid artery: Principal | ICD-10-CM | POA: Diagnosis present

## 2022-11-17 HISTORY — PX: TRANSCAROTID ARTERY REVASCULARIZATIONÂ: SHX6778

## 2022-11-17 LAB — URINALYSIS, ROUTINE W REFLEX MICROSCOPIC
Bacteria, UA: NONE SEEN
Bilirubin Urine: NEGATIVE
Glucose, UA: >=500 mg/dL — AB
Hgb urine dipstick: NEGATIVE
Ketones, ur: NEGATIVE mg/dL
Leukocytes,Ua: NEGATIVE
Nitrite: NEGATIVE
Protein, ur: NEGATIVE mg/dL
Specific Gravity, Urine: 1.035 — ABNORMAL HIGH (ref 1.005–1.030)
pH: 5 (ref 5.0–8.0)

## 2022-11-17 LAB — CBC
HCT: 45.9 % (ref 39.0–52.0)
Hemoglobin: 15.7 g/dL (ref 13.0–17.0)
MCH: 32.7 pg (ref 26.0–34.0)
MCHC: 34.2 g/dL (ref 30.0–36.0)
MCV: 95.6 fL (ref 80.0–100.0)
Platelets: 178 10*3/uL (ref 150–400)
RBC: 4.8 MIL/uL (ref 4.22–5.81)
RDW: 13 % (ref 11.5–15.5)
WBC: 7.1 10*3/uL (ref 4.0–10.5)
nRBC: 0 % (ref 0.0–0.2)

## 2022-11-17 LAB — POCT ACTIVATED CLOTTING TIME: Activated Clotting Time: 293 seconds

## 2022-11-17 LAB — PROTIME-INR
INR: 1 (ref 0.8–1.2)
Prothrombin Time: 13.7 seconds (ref 11.4–15.2)

## 2022-11-17 LAB — COMPREHENSIVE METABOLIC PANEL
ALT: 29 U/L (ref 0–44)
AST: 24 U/L (ref 15–41)
Albumin: 4 g/dL (ref 3.5–5.0)
Alkaline Phosphatase: 67 U/L (ref 38–126)
Anion gap: 11 (ref 5–15)
BUN: 17 mg/dL (ref 8–23)
CO2: 21 mmol/L — ABNORMAL LOW (ref 22–32)
Calcium: 9.3 mg/dL (ref 8.9–10.3)
Chloride: 104 mmol/L (ref 98–111)
Creatinine, Ser: 0.95 mg/dL (ref 0.61–1.24)
GFR, Estimated: >60 mL/min (ref >60)
Glucose, Bld: 163 mg/dL — ABNORMAL HIGH (ref 70–99)
Potassium: 4.1 mmol/L (ref 3.5–5.1)
Sodium: 136 mmol/L (ref 135–145)
Total Bilirubin: 0.7 mg/dL (ref 0.3–1.2)
Total Protein: 7.4 g/dL (ref 6.5–8.1)

## 2022-11-17 LAB — APTT: aPTT: 30 seconds (ref 24–36)

## 2022-11-17 LAB — GLUCOSE, CAPILLARY
Glucose-Capillary: 172 mg/dL — ABNORMAL HIGH (ref 70–99)
Glucose-Capillary: 138 mg/dL — ABNORMAL HIGH (ref 70–99)
Glucose-Capillary: 145 mg/dL — ABNORMAL HIGH (ref 70–99)

## 2022-11-17 LAB — TYPE AND SCREEN
ABO/RH(D): A POS
Antibody Screen: NEGATIVE

## 2022-11-17 SURGERY — TRANSCAROTID ARTERY REVASCULARIZATION (TCAR)
Anesthesia: General | Site: Neck | Laterality: Right

## 2022-11-17 MED ORDER — PROPOFOL 10 MG/ML IV BOLUS
INTRAVENOUS | Status: DC | PRN
  Administered 2022-11-17: 50 ug/kg/min via INTRAVENOUS

## 2022-11-17 MED ORDER — PHENYLEPHRINE 80 MCG/ML (10ML) SYRINGE FOR IV PUSH (FOR BLOOD PRESSURE SUPPORT)
PREFILLED_SYRINGE | INTRAVENOUS | Status: AC
  Filled 2022-11-17: qty 10

## 2022-11-17 MED ORDER — ONDANSETRON HCL 4 MG/2ML IJ SOLN: 4.0000 mg | Freq: Four times a day (QID) | INTRAMUSCULAR | Status: DC | PRN

## 2022-11-17 MED ORDER — DOCUSATE SODIUM 100 MG PO CAPS
100.0000 mg | ORAL_CAPSULE | Freq: Every day | ORAL | Status: DC
  Filled 2022-11-17: qty 1

## 2022-11-17 MED ORDER — MAGNESIUM SULFATE 2 GM/50ML IV SOLN: 2.0000 g | Freq: Every day | INTRAVENOUS | Status: DC | PRN

## 2022-11-17 MED ORDER — ACETAMINOPHEN 160 MG/5ML PO SOLN: 325.0000 mg | ORAL | Status: DC | PRN

## 2022-11-17 MED ORDER — ALUM & MAG HYDROXIDE-SIMETH 200-200-20 MG/5ML PO SUSP: 15.0000 mL | ORAL | Status: DC | PRN

## 2022-11-17 MED ORDER — ROCURONIUM BROMIDE 10 MG/ML (PF) SYRINGE
PREFILLED_SYRINGE | INTRAVENOUS | Status: DC | PRN
  Administered 2022-11-17: 20 mg via INTRAVENOUS
  Administered 2022-11-17: 50 mg via INTRAVENOUS

## 2022-11-17 MED ORDER — NOREPINEPHRINE 4 MG/250ML-% IV SOLN
INTRAVENOUS | Status: DC | PRN
  Administered 2022-11-17: 10 ug/min via INTRAVENOUS

## 2022-11-17 MED ORDER — HYDRALAZINE HCL 20 MG/ML IJ SOLN: 5.0000 mg | INTRAMUSCULAR | Status: DC | PRN

## 2022-11-17 MED ORDER — SODIUM CHLORIDE 0.9 % IV SOLN: INTRAVENOUS | Status: DC

## 2022-11-17 MED ORDER — ROSUVASTATIN CALCIUM 5 MG PO TABS
10.0000 mg | ORAL_TABLET | Freq: Every day | ORAL | Status: DC
  Administered 2022-11-17 – 2022-11-18 (×2): 10 mg via ORAL
  Filled 2022-11-17 (×2): qty 2

## 2022-11-17 MED ORDER — ACETAMINOPHEN 650 MG RE SUPP: 325.0000 mg | RECTAL | Status: DC | PRN

## 2022-11-17 MED ORDER — HEPARIN 6000 UNIT IRRIGATION SOLUTION
Status: DC | PRN
  Administered 2022-11-17: 1

## 2022-11-17 MED ORDER — INSULIN ASPART 100 UNIT/ML IJ SOLN
0.0000 [IU] | INTRAMUSCULAR | Status: DC | PRN
  Administered 2022-11-17: 2 [IU] via SUBCUTANEOUS

## 2022-11-17 MED ORDER — FENTANYL CITRATE (PF) 100 MCG/2ML IJ SOLN: 25.0000 ug | INTRAMUSCULAR | Status: DC | PRN

## 2022-11-17 MED ORDER — FENTANYL CITRATE (PF) 250 MCG/5ML IJ SOLN
INTRAMUSCULAR | Status: DC | PRN
  Administered 2022-11-17 (×2): 50 ug via INTRAVENOUS

## 2022-11-17 MED ORDER — CHLORHEXIDINE GLUCONATE CLOTH 2 % EX PADS: 6.0000 | MEDICATED_PAD | Freq: Once | CUTANEOUS | Status: DC

## 2022-11-17 MED ORDER — EPHEDRINE 5 MG/ML INJ
INTRAVENOUS | Status: AC
  Filled 2022-11-17: qty 5

## 2022-11-17 MED ORDER — GLYCOPYRROLATE 0.2 MG/ML IJ SOLN
INTRAMUSCULAR | Status: DC | PRN
  Administered 2022-11-17 (×2): .1 mg via INTRAVENOUS

## 2022-11-17 MED ORDER — ACETAMINOPHEN 325 MG PO TABS: 325.0000 mg | ORAL_TABLET | ORAL | Status: DC | PRN

## 2022-11-17 MED ORDER — CEFAZOLIN SODIUM-DEXTROSE 2-4 GM/100ML-% IV SOLN
2.0000 g | INTRAVENOUS | Status: AC
  Administered 2022-11-17: 2 g via INTRAVENOUS
  Filled 2022-11-17: qty 100

## 2022-11-17 MED ORDER — 0.9 % SODIUM CHLORIDE (POUR BTL) OPTIME
TOPICAL | Status: DC | PRN
  Administered 2022-11-17: 1000 mL

## 2022-11-17 MED ORDER — ASPIRIN 81 MG PO CHEW
81.0000 mg | CHEWABLE_TABLET | Freq: Every day | ORAL | Status: DC
  Administered 2022-11-18: 81 mg via ORAL
  Filled 2022-11-17: qty 1

## 2022-11-17 MED ORDER — PHENOL 1.4 % MT LIQD: 1.0000 | OROMUCOSAL | Status: DC | PRN

## 2022-11-17 MED ORDER — ORAL CARE MOUTH RINSE: 15.0000 mL | Freq: Once | OROMUCOSAL | Status: AC

## 2022-11-17 MED ORDER — GLYCOPYRROLATE PF 0.2 MG/ML IJ SOSY
PREFILLED_SYRINGE | INTRAMUSCULAR | Status: AC
  Filled 2022-11-17: qty 1

## 2022-11-17 MED ORDER — STERILE WATER FOR IRRIGATION IR SOLN
Status: DC | PRN
  Administered 2022-11-17: 1000 mL

## 2022-11-17 MED ORDER — AMISULPRIDE (ANTIEMETIC) 5 MG/2ML IV SOLN: 10.0000 mg | Freq: Once | INTRAVENOUS | Status: DC | PRN

## 2022-11-17 MED ORDER — ETOMIDATE 2 MG/ML IV SOLN
INTRAVENOUS | Status: DC | PRN
  Administered 2022-11-17: 10 mg via INTRAVENOUS

## 2022-11-17 MED ORDER — SODIUM CHLORIDE 0.9 % IV SOLN: 500.0000 mL | Freq: Once | INTRAVENOUS | Status: DC | PRN

## 2022-11-17 MED ORDER — INSULIN ASPART 100 UNIT/ML IJ SOLN
0.0000 [IU] | Freq: Three times a day (TID) | INTRAMUSCULAR | Status: DC
  Administered 2022-11-18: 3 [IU] via SUBCUTANEOUS

## 2022-11-17 MED ORDER — HEPARIN 6000 UNIT IRRIGATION SOLUTION
Status: AC
  Filled 2022-11-17: qty 500

## 2022-11-17 MED ORDER — EPHEDRINE SULFATE-NACL 50-0.9 MG/10ML-% IV SOSY
PREFILLED_SYRINGE | INTRAVENOUS | Status: DC | PRN
  Administered 2022-11-17 (×2): 5 mg via INTRAVENOUS

## 2022-11-17 MED ORDER — PROTAMINE SULFATE 10 MG/ML IV SOLN
INTRAVENOUS | Status: DC | PRN
  Administered 2022-11-17: 50 mg via INTRAVENOUS

## 2022-11-17 MED ORDER — HEPARIN SODIUM (PORCINE) 1000 UNIT/ML IJ SOLN
INTRAMUSCULAR | Status: DC | PRN
  Administered 2022-11-17: 10000 [IU] via INTRAVENOUS

## 2022-11-17 MED ORDER — POTASSIUM CHLORIDE CRYS ER 20 MEQ PO TBCR: 20.0000 meq | EXTENDED_RELEASE_TABLET | Freq: Every day | ORAL | Status: DC | PRN

## 2022-11-17 MED ORDER — GUAIFENESIN-DM 100-10 MG/5ML PO SYRP: 15.0000 mL | ORAL_SOLUTION | ORAL | Status: DC | PRN

## 2022-11-17 MED ORDER — LIDOCAINE 2% (20 MG/ML) 5 ML SYRINGE
INTRAMUSCULAR | Status: AC
  Filled 2022-11-17: qty 5

## 2022-11-17 MED ORDER — LACTATED RINGERS IV SOLN: INTRAVENOUS | Status: DC

## 2022-11-17 MED ORDER — DEXAMETHASONE SODIUM PHOSPHATE 10 MG/ML IJ SOLN
INTRAMUSCULAR | Status: DC | PRN
  Administered 2022-11-17: 5 mg via INTRAVENOUS

## 2022-11-17 MED ORDER — LIDOCAINE 2% (20 MG/ML) 5 ML SYRINGE
INTRAMUSCULAR | Status: DC | PRN
  Administered 2022-11-17: 40 mg via INTRAVENOUS

## 2022-11-17 MED ORDER — FENTANYL CITRATE (PF) 250 MCG/5ML IJ SOLN
INTRAMUSCULAR | Status: AC
  Filled 2022-11-17: qty 5

## 2022-11-17 MED ORDER — OXYCODONE-ACETAMINOPHEN 5-325 MG PO TABS: 1.0000 | ORAL_TABLET | ORAL | Status: DC | PRN

## 2022-11-17 MED ORDER — SPIRONOLACTONE 25 MG PO TABS
25.0000 mg | ORAL_TABLET | Freq: Every day | ORAL | Status: DC
  Administered 2022-11-17 – 2022-11-18 (×2): 25 mg via ORAL
  Filled 2022-11-17 (×2): qty 1

## 2022-11-17 MED ORDER — SODIUM CHLORIDE 0.9 % IV SOLN
0.1500 ug/kg/min | INTRAVENOUS | Status: AC
  Administered 2022-11-17: .05 ug/kg/min via INTRAVENOUS
  Filled 2022-11-17: qty 2000

## 2022-11-17 MED ORDER — CLOPIDOGREL BISULFATE 75 MG PO TABS
75.0000 mg | ORAL_TABLET | Freq: Every day | ORAL | Status: DC
  Administered 2022-11-18: 75 mg via ORAL
  Filled 2022-11-17: qty 1

## 2022-11-17 MED ORDER — PANTOPRAZOLE SODIUM 40 MG PO TBEC
40.0000 mg | DELAYED_RELEASE_TABLET | Freq: Every day | ORAL | Status: DC
  Administered 2022-11-17 – 2022-11-18 (×2): 40 mg via ORAL
  Filled 2022-11-17 (×2): qty 1

## 2022-11-17 MED ORDER — IODIXANOL 320 MG/ML IV SOLN
INTRAVENOUS | Status: DC | PRN
  Administered 2022-11-17: 12 mL

## 2022-11-17 MED ORDER — ISOSORBIDE DINITRATE 10 MG PO TABS
10.0000 mg | ORAL_TABLET | Freq: Three times a day (TID) | ORAL | Status: DC
  Administered 2022-11-17 – 2022-11-18 (×3): 10 mg via ORAL
  Filled 2022-11-17 (×3): qty 1

## 2022-11-17 MED ORDER — ONDANSETRON HCL 4 MG/2ML IJ SOLN
INTRAMUSCULAR | Status: AC
  Filled 2022-11-17: qty 2

## 2022-11-17 MED ORDER — ROCURONIUM BROMIDE 10 MG/ML (PF) SYRINGE
PREFILLED_SYRINGE | INTRAVENOUS | Status: AC
  Filled 2022-11-17: qty 10

## 2022-11-17 MED ORDER — PROPOFOL 10 MG/ML IV BOLUS
INTRAVENOUS | Status: AC
  Filled 2022-11-17: qty 20

## 2022-11-17 MED ORDER — CEFAZOLIN SODIUM-DEXTROSE 2-4 GM/100ML-% IV SOLN
2.0000 g | Freq: Three times a day (TID) | INTRAVENOUS | Status: AC
  Administered 2022-11-17 – 2022-11-18 (×2): 2 g via INTRAVENOUS
  Filled 2022-11-17 (×2): qty 100

## 2022-11-17 MED ORDER — LABETALOL HCL 5 MG/ML IV SOLN: 10.0000 mg | INTRAVENOUS | Status: DC | PRN

## 2022-11-17 MED ORDER — CARVEDILOL 6.25 MG PO TABS
6.2500 mg | ORAL_TABLET | Freq: Two times a day (BID) | ORAL | Status: DC
  Administered 2022-11-17 – 2022-11-18 (×2): 6.25 mg via ORAL
  Filled 2022-11-17 (×2): qty 1

## 2022-11-17 MED ORDER — METOPROLOL TARTRATE 5 MG/5ML IV SOLN: 2.0000 mg | INTRAVENOUS | Status: DC | PRN

## 2022-11-17 MED ORDER — MORPHINE SULFATE (PF) 2 MG/ML IV SOLN: 2.0000 mg | INTRAVENOUS | Status: DC | PRN

## 2022-11-17 MED ORDER — SACUBITRIL-VALSARTAN 24-26 MG PO TABS
1.0000 | ORAL_TABLET | Freq: Two times a day (BID) | ORAL | Status: DC
  Administered 2022-11-17 – 2022-11-18 (×2): 1 via ORAL
  Filled 2022-11-17 (×2): qty 1

## 2022-11-17 MED ORDER — CHLORHEXIDINE GLUCONATE 0.12 % MT SOLN
15.0000 mL | Freq: Once | OROMUCOSAL | Status: AC
  Administered 2022-11-17: 15 mL via OROMUCOSAL
  Filled 2022-11-17: qty 15

## 2022-11-17 MED ORDER — ACETAMINOPHEN 10 MG/ML IV SOLN: 1000.0000 mg | Freq: Once | INTRAVENOUS | Status: DC | PRN

## 2022-11-17 MED ORDER — ONDANSETRON HCL 4 MG/2ML IJ SOLN
INTRAMUSCULAR | Status: DC | PRN
  Administered 2022-11-17: 4 mg via INTRAVENOUS

## 2022-11-17 SURGICAL SUPPLY — 53 items
ADH SKN CLS APL DERMABOND .7 (GAUZE/BANDAGES/DRESSINGS) ×2
ADH SKN CLS LQ APL DERMABOND (GAUZE/BANDAGES/DRESSINGS) ×1
APL PRP STRL LF DISP 70% ISPRP (MISCELLANEOUS) ×2
BAG BANDED W/RUBBER/TAPE 36X54 (MISCELLANEOUS) ×1 IMPLANT
BAG EQP BAND 135X91 W/RBR TAPE (MISCELLANEOUS) ×1
BALLN STERLING RX 6X20X80 (BALLOONS) ×1
BALLOON STERLING RX 6X20X80 (BALLOONS) IMPLANT
CANISTER SUCT 3000ML PPV (MISCELLANEOUS) ×1 IMPLANT
CHLORAPREP W/TINT 26 (MISCELLANEOUS) ×2 IMPLANT
CLIP LIGATING EXTRA MED SLVR (CLIP) IMPLANT
CLIP LIGATING EXTRA SM BLUE (MISCELLANEOUS) IMPLANT
COVER DOME SNAP 22 D (MISCELLANEOUS) ×1 IMPLANT
COVER PROBE W GEL 5X96 (DRAPES) ×1 IMPLANT
DERMABOND ADVANCED .7 DNX12 (GAUZE/BANDAGES/DRESSINGS) IMPLANT
DERMABOND ADVANCED .7 DNX6 (GAUZE/BANDAGES/DRESSINGS) ×1 IMPLANT
DRAPE FEMORAL ANGIO 80X135IN (DRAPES) ×1 IMPLANT
ELECT REM PT RETURN 9FT ADLT (ELECTROSURGICAL) ×1
ELECTRODE REM PT RTRN 9FT ADLT (ELECTROSURGICAL) ×1 IMPLANT
GAUZE SPONGE 4X4 12PLY STRL (GAUZE/BANDAGES/DRESSINGS) ×1 IMPLANT
GLOVE BIO SURGEON STRL SZ8 (GLOVE) ×1 IMPLANT
GOWN STRL REUS W/ TWL LRG LVL3 (GOWN DISPOSABLE) ×2 IMPLANT
GOWN STRL REUS W/ TWL XL LVL3 (GOWN DISPOSABLE) ×1 IMPLANT
GOWN STRL REUS W/TWL LRG LVL3 (GOWN DISPOSABLE) ×2
GOWN STRL REUS W/TWL XL LVL3 (GOWN DISPOSABLE) ×1
GUIDEWIRE ENROUTE 0.014 (WIRE) ×1 IMPLANT
INTRODUCER KIT GALT 7CM (INTRODUCER) ×1
KIT BASIN OR (CUSTOM PROCEDURE TRAY) ×1 IMPLANT
KIT ENCORE 26 ADVANTAGE (KITS) ×1 IMPLANT
KIT INTRODUCER GALT 7 (INTRODUCER) ×1 IMPLANT
KIT TURNOVER KIT B (KITS) ×1 IMPLANT
NDL HYPO 25GX1X1/2 BEV (NEEDLE) IMPLANT
NEEDLE HYPO 25GX1X1/2 BEV (NEEDLE) IMPLANT
NS IRRIG 1000ML POUR BTL (IV SOLUTION) ×1 IMPLANT
PACK CAROTID (CUSTOM PROCEDURE TRAY) ×1 IMPLANT
POSITIONER HEAD DONUT 9IN (MISCELLANEOUS) ×1 IMPLANT
SET MICROPUNCTURE 5F STIFF (MISCELLANEOUS) ×1 IMPLANT
SHEATH AVANTI 11CM 5FR (SHEATH) IMPLANT
SHUNT CAROTID BYPASS 10 (VASCULAR PRODUCTS) IMPLANT
STENT TRANSCAROTID SYS 10X30 (Permanent Stent) IMPLANT
SUT MNCRL AB 4-0 PS2 18 (SUTURE) ×1 IMPLANT
SUT PROLENE 5 0 C 1 24 (SUTURE) ×1 IMPLANT
SUT SILK 2 0 SH (SUTURE) ×1 IMPLANT
SUT VIC AB 3-0 SH 27 (SUTURE) ×1
SUT VIC AB 3-0 SH 27X BRD (SUTURE) ×1 IMPLANT
SYR 10ML LL (SYRINGE) ×3 IMPLANT
SYR 20ML LL LF (SYRINGE) ×1 IMPLANT
SYR CONTROL 10ML LL (SYRINGE) IMPLANT
SYSTEM TRANSCAROTID NEUROPRTCT (MISCELLANEOUS) ×1 IMPLANT
TOWEL GREEN STERILE (TOWEL DISPOSABLE) ×1 IMPLANT
TRANSCAROTID NEUROPROTECT SYS (MISCELLANEOUS) ×1
WATER STERILE IRR 1000ML POUR (IV SOLUTION) ×1 IMPLANT
WIRE BENTSON .035X145CM (WIRE) ×1 IMPLANT
WIRE TORQFLEX AUST .018X40CM (WIRE) IMPLANT

## 2022-11-17 NOTE — Transfer of Care (Signed)
Immediate Anesthesia Transfer of Care Note  Patient: [name]  Procedure(s) Performed: Right Transcarotid Artery Revascularization (Right: Neck)  Patient Location: PACU  Anesthesia Type:General  Level of Consciousness: drowsy  Airway & Oxygen Therapy: Patient Spontanous Breathing and Patient connected to face mask oxygen  Post-op Assessment: Report given to RN and Post -op Vital signs reviewed and stable  Post vital signs: Reviewed and stable  Last Vitals:  Vitals Value Taken Time  BP 146/78 11/17/22 1339  Temp    Pulse 93 11/17/22 1344  Resp 17 11/17/22 1344  SpO2 92 % 11/17/22 1344  Vitals shown include unvalidated device data.  Last Pain:  Vitals:   11/17/22 0922  TempSrc:   PainSc: 1       Patients Stated Pain Goal: 0 (11/17/22 9604)  Complications: No notable events documented.

## 2022-11-17 NOTE — Anesthesia Preprocedure Evaluation (Addendum)
Anesthesia Evaluation  Patient identified by MRN, date of birth, ID band Patient awake    Reviewed: Allergy & Precautions, NPO status , Patient's Chart, lab work & pertinent test results, reviewed documented beta blocker date and time   Airway Mallampati: I  TM Distance: >3 FB Neck ROM: Full    Dental  (+) Teeth Intact, Dental Advisory Given, Caps   Pulmonary former smoker   breath sounds clear to auscultation       Cardiovascular hypertension, Pt. on medications + CAD, + Past MI, + Peripheral Vascular Disease and +CHF  + Cardiac Defibrillator  Rhythm:Regular Rate:Normal  Echo: 1. Left ventricular ejection fraction, by estimation, is <20%. The left  ventricle has severely decreased function. The left ventricle demonstrates  global hypokinesis. The left ventricular internal cavity size was severely  dilated. Left ventricular  diastolic parameters are consistent with Grade II diastolic dysfunction  (pseudonormalization). Elevated left ventricular end-diastolic pressure.   2. Right ventricular systolic function is moderately reduced. The right  ventricular size is normal. There is normal pulmonary artery systolic  pressure. The estimated right ventricular systolic pressure is 22.2 mmHg.   3. Left atrial size was mildly dilated.   4. The mitral valve is normal in structure. Trivial mitral valve  regurgitation. No evidence of mitral stenosis.   5. The aortic valve was not well visualized. Aortic valve regurgitation  is not visualized. Aortic valve sclerosis/calcification is present,  without any evidence of aortic stenosis.   6. The inferior vena cava is dilated in size with <50% respiratory  variability, suggesting right atrial pressure of 15 mmHg.     Neuro/Psych TIA negative psych ROS   GI/Hepatic Neg liver ROS,GERD  Medicated,,  Endo/Other  diabetes, Type 2, Oral Hypoglycemic Agents    Renal/GU       Musculoskeletal  (+) Arthritis ,    Abdominal   Peds  Hematology negative hematology ROS (+)   Anesthesia Other Findings   Reproductive/Obstetrics                             Anesthesia Physical Anesthesia Plan  ASA: 4  Anesthesia Plan: General   Post-op Pain Management: Minimal or no pain anticipated   Induction: Intravenous  PONV Risk Score and Plan: 2 and Ondansetron and Midazolam  Airway Management Planned: Oral ETT  Additional Equipment: Arterial line  Intra-op Plan:   Post-operative Plan: Extubation in OR  Informed Consent: I have reviewed the patients History and Physical, chart, labs and discussed the procedure including the risks, benefits and alternatives for the proposed anesthesia with the patient or authorized representative who has indicated his/her understanding and acceptance.       Plan Discussed with: CRNA  Anesthesia Plan Comments: (Etomidate, Remifentanil gtt prior to induction)       Anesthesia Quick Evaluation

## 2022-11-17 NOTE — Anesthesia Procedure Notes (Addendum)
Arterial Line Insertion Start/End5/15/2024 9:30 AM, 11/17/2022 9:40 AM Performed by: Darryl Nestle, CRNA, CRNA  Patient location: Pre-op. Preanesthetic checklist: patient identified, IV checked, site marked, risks and benefits discussed, surgical consent, monitors and equipment checked, pre-op evaluation, timeout performed and anesthesia consent Lidocaine 1% used for infiltration Right, radial was placed Catheter size: 20 G Hand hygiene performed  and maximum sterile barriers used   Attempts: 1 Procedure performed without using ultrasound guided technique. Following insertion, dressing applied and Biopatch. Post procedure assessment: normal and unchanged  Patient tolerated the procedure well with no immediate complications.

## 2022-11-17 NOTE — Progress Notes (Signed)
  Day of Surgery Note    Subjective:  no complaints.  Feels groggy   Vitals:   11/17/22 0849  BP: (!) 179/80  Pulse: 67  Resp: 16  Temp: 98.1 F (36.7 C)  SpO2: 97%    Incisions:   clean and dry; left groin looks fine Neuro:  moving all extremities equally; tongue is midline Cardiac:  regular Lungs:  non labored     Assessment/Plan:  This is a 67 y.o. male who is s/p  Right TCAR  -pt doing well in recovery.  Neuro in tact.  Tongue is midline -continue asa/statin/plavix -to 4 east later today -anticipate discharge tomorrow if evening is uneventful   Doreatha Massed, PA-C 11/17/2022 1:48 PM [phone]

## 2022-11-17 NOTE — Op Note (Signed)
DATE OF SERVICE: 11/17/2022  PATIENT:  [name]  67 y.o. male  PRE-OPERATIVE DIAGNOSIS:  symptomatic right carotid artery stenosis  POST-OPERATIVE DIAGNOSIS:  Same  PROCEDURE:   Right transcarotid artery revascularization (TCAR)  SURGEON:  Surgeon(s) and Role:    * Leonie Douglas, MD - Primary  ASSISTANT: Aggie Moats, PA-C  An experienced assistant was required given the complexity of this procedure and the standard of surgical care. My assistant helped with exposure through counter tension, suctioning, ligation and retraction to better visualize the surgical field.  My assistant expedited sewing during the case by following my sutures. Wherever I use the term "we" in the report, my assistant actively helped me with that portion of the procedure.  ANESTHESIA:   general  EBL: 50mL  BLOOD ADMINISTERED:none  DRAINS: none   LOCAL MEDICATIONS USED:  NONE  SPECIMEN:  none  COUNTS: confirmed correct.  TOURNIQUET:  none  PATIENT DISPOSITION:  PACU - hemodynamically stable.   Delay start of Pharmacological VTE agent (>24hrs) due to surgical blood loss or risk of bleeding: no  INDICATION FOR PROCEDURE: [name] is a 67 y.o. 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 He had previously undergone right carotid endarterectomy. After careful discussion of risks, benefits, and alternatives the patient was offered TCAR. We specifically discussed risk of stroke and cranial nerve injury. The patient understood and wished to proceed.  OPERATIVE FINDINGS: successful TCAR. Awoke at neurologic baseline.   DESCRIPTION OF PROCEDURE: After identification of the patient in the pre-operative holding area, the patient was transferred to the operating room. The patient was positioned supine on the operating room table. Anesthesia was induced. The right neck was prepped and draped in standard fashion. A surgical pause was performed confirming correct  patient, procedure, and operative location.  Using intraoperative ultrasound the course of the right common carotid artery was mapped on the skin.  A transverse incision was made between the sternal and clavicular heads of the sternocleidomastoid muscle, below the omohyoid. Following longitudinal division of the carotid sheath the jugular vein was partially skeletonized and retracted medially. Once 3 cm of common carotid artery (CCA) were isolated, umbilical tape was placed around the proximal 1/3 of the CCA under direct vision. A 5-O Prolene suture was pre-placed in the anterior wall of the CCA, in a "U stitch" configuration,  close to the clavicle to facilitate hemostasis upon removal of the arterial sheath at completion of the TCAR procedure.   The contralateral common femoral vein (CFV) was accessed under ultrasound guidance, using standard Seldinger and micropuncture access technique. The venous return sheath was advanced into the CFV over the 0.035" wire provided. Blood was aspirated from the flow line followed by flushing of the Venous Sheath with heparinized saline. The Venous Sheath was secured to the patient's skin with suture to maintain  optimal position in the vessel. Heparin was given to obtain a therapeutic activated clotting time >250 seconds prior to arterial access.   A 4-French non-stiffened ENHANCE Transcarotid / Peripheral Access set was used, puncturing the artery with the 21G needle through the pre-placed "U" stitch while holding gentle traction on the umbilical tape to stabilize and centralize the CCA within the incision. Careful attention was paid to the change in CCA shape when using the umbilical tape to control or lift the artery. The micropuncture wire was then advanced 3-4 cm into the CCA and, the 21G needle was removed. The micropuncture sheath was advanced 2-3 cm into  the CCA and the wire and dilator were removed. Pulsatile backflow indicated correct positioning. The provided  0.035" J-tipped guidewire was inserted as close as possible to the bifurcation without engaging the lesion. After micropuncture sheath removal, the Transcarotid Arterial Sheath was advanced to the 2.5cm marker and the 0.035" wire and dilator were then removed. Arterial Sheath position was assessed under fluoroscopy in two projections to ensure that the sheath tip was oriented coaxially in the CCA. The Arterial Sheath was sutured to the patient with gentle forward tension. Blood was slowly aspirated followed by flushing with heparinized saline. No ingress of air bubbles through the passive hemostatic valve was observed. The stopcocks were closed. Traction applied to the CCA previously to facilitate access was gently released.  The Flow Controller was connected to the Transcarotid Arterial Sheath, prepared by passively allowing a column of arterial blood to fill the line and connected to the Venous Return Sheath. CCA inflow was occluded proximal to the arteriotomy with a vascular clamp to achieve active flow reversal. To confirm flow reversal, a saline bolus was delivered into the venous flow line on both "High" and "Low" flow settings of the Flow Controller. Angiograms were performed with slow injections of a small amount of contrast filling just past the lesion to minimize antegrade transmission of micro-bubbles.  Prior to lesion manipulation, heart rate (70bpm) and systolic BP (140-145mmHg) were managed upwards to optimize flow reversal and procedural neuroprotection. The lesion was crossed with an 0.014" ENROUTE guidewire and pre-dilation of the lesion was performed with a 6x110mm rapid exchange 0.014" compatible balloon catheter to 8 atmospheres for 10 seconds. Stenting was performed with an 10x29mm ENROUTE Transcarotid stent, sized appropriately to the right CCA.  A total of 8 minutes of flow reversal was used.  AP and lateral angiograms (gentle contrast injections) were performed to confirm stent  placement and arterial wall stent apposition. At [facility] case completion, antegrade flow was restored by releasing the clamp on the CCA then closing the NPS stopcocks to the flow lines. The Transcarotid Arterial Sheath was removed and the pre-closure suture was tied. Heparin reversal was employed and a drain was placed. The Venous Return Sheath was removed and hemostasis was achieved with brief manual compression.   Upon completion of the case instrument and sharps counts were confirmed correct. The patient was transferred to the PACU in good condition. I was present for all portions of the procedure.  FOLLOW UP PLAN: Assuming a normal postoperative course, I will see the patient in 4 weeks with carotid duplex. Needs ASA / Plavix until that visit at minimum.   Rande Brunt. Lenell Antu, MD [facility] Phone Number: [phone] 11/17/2022 1:45 PM

## 2022-11-17 NOTE — Discharge Instructions (Signed)
   [facility] [place]  Discharge Instructions   Carotid Surgery  Please refer to the following instructions for your post-procedure care. Your surgeon or physician assistant will discuss any changes with you.  Activity  You are encouraged to walk as much as you can. You can slowly return to normal activities but must avoid strenuous activity and heavy lifting until your doctor tell you it's okay. Avoid activities such as vacuuming or swinging a golf club. You can drive after one week if you are comfortable and you are no longer taking prescription pain medications. It is normal to feel tired for serval weeks after your surgery. It is also normal to have difficulty with sleep habits, eating, and bowel movements after surgery. These will go away with time.  Bathing/Showering  Shower daily after you go home. Do not soak in a bathtub, hot tub, or swim until the incision heals completely.  Incision Care  Shower every day. Clean your incision with mild soap and water. Pat the area dry with a clean towel. You do not need a bandage unless otherwise instructed. Do not apply any ointments or creams to your incision. You may have skin glue on your incision. Do not peel it off. It will come off on its own in about one week. Your incision may feel thickened and raised for several weeks after your surgery. This is normal and the skin will soften over time.   For Men Only: It's okay to shave around the incision but do not shave the incision itself for 2 weeks. It is common to have numbness under your chin that could last for several months.  Diet  Resume your normal diet. There are no special food restrictions following this procedure. A low fat/low cholesterol diet is recommended for all patients with vascular disease. In order to heal from your surgery, it is CRITICAL to get adequate nutrition. Your body requires vitamins, minerals, and protein. Vegetables are the best source of  vitamins and minerals. Vegetables also provide the perfect balance of protein. Processed food has little nutritional value, so try to avoid this.  Medications  Resume taking all of your medications unless your doctor or physician assistant tells you not to. If your incision is causing pain, you may take over-the- counter pain relievers such as acetaminophen (Tylenol). If you were prescribed a stronger pain medication, please be aware these medications can cause nausea and constipation. Prevent nausea by taking the medication with a snack or meal. Avoid constipation by drinking plenty of fluids and eating foods with a high amount of fiber, such as fruits, vegetables, and grains.   Do not take Tylenol if you are taking prescription pain medications.  Follow Up  Our office will schedule a follow up appointment 2-3 weeks following discharge.  Please call us immediately for any of the following conditions  . Increased pain, redness, drainage (pus) from your incision site. . Fever of 101 degrees or higher. . If you should develop stroke (slurred speech, difficulty swallowing, weakness on one side of your body, loss of vision) you should call 911 and go to the nearest emergency room. .  Reduce your risk of vascular disease:  . Stop smoking. If you would like help call [facility] at 1-800-QUIT-NOW (1-800-784-8669) or [place] at [phone]. . Manage your cholesterol . Maintain a desired weight . Control your diabetes . Keep your blood pressure down .  If you have any questions, please call the office at [phone]. 

## 2022-11-17 NOTE — Anesthesia Postprocedure Evaluation (Signed)
Anesthesia Post Note  Patient: LANDIS VARON  Procedure(s) Performed: Right Transcarotid Artery Revascularization (Right: Neck)     Patient location during evaluation: PACU Anesthesia Type: General Level of consciousness: awake and alert Pain management: pain level controlled Vital Signs Assessment: post-procedure vital signs reviewed and stable Respiratory status: spontaneous breathing, nonlabored ventilation, respiratory function stable and patient connected to nasal cannula oxygen Cardiovascular status: blood pressure returned to baseline and stable Postop Assessment: no apparent nausea or vomiting Anesthetic complications: no  No notable events documented.  Last Vitals:  Vitals:   11/17/22 1530 11/17/22 1545  BP: 129/62 124/61  Pulse: 75 76  Resp: 20 12  Temp:  36.6 C  SpO2: 95% 97%    Last Pain:  Vitals:   11/17/22 1545  TempSrc:   PainSc: 0-No pain                 Shelton Silvas

## 2022-11-17 NOTE — Plan of Care (Signed)
  Problem: Education: Goal: Understanding of CV disease, CV risk reduction, and recovery process will improve Outcome: Progressing Goal: Individualized Educational Video(s) Outcome: Progressing   Problem: Activity: Goal: Ability to return to baseline activity level will improve Outcome: Progressing   Problem: Cardiovascular: Goal: Ability to achieve and maintain adequate cardiovascular perfusion will improve Outcome: Progressing Goal: Vascular access site(s) Level 0-1 will be maintained Outcome: Progressing   Problem: Health Behavior/Discharge Planning: Goal: Ability to safely manage health-related needs after discharge will improve Outcome: Progressing   Problem: Education: Goal: Knowledge of cardiac device and self-care will improve Outcome: Progressing Goal: Ability to safely manage health related needs after discharge will improve Outcome: Progressing Goal: Individualized Educational Video(s) Outcome: Progressing   Problem: Cardiac: Goal: Ability to achieve and maintain adequate cardiopulmonary perfusion will improve Outcome: Progressing   Problem: Education: Goal: Ability to describe self-care measures that may prevent or decrease complications (Diabetes Survival Skills Education) will improve Outcome: Progressing Goal: Individualized Educational Video(s) Outcome: Progressing   Problem: Coping: Goal: Ability to adjust to condition or change in health will improve Outcome: Progressing   Problem: Fluid Volume: Goal: Ability to maintain a balanced intake and output will improve Outcome: Progressing   Problem: Health Behavior/Discharge Planning: Goal: Ability to identify and utilize available resources and services will improve Outcome: Progressing Goal: Ability to manage health-related needs will improve Outcome: Progressing   Problem: Metabolic: Goal: Ability to maintain appropriate glucose levels will improve Outcome: Progressing   Problem:  Nutritional: Goal: Maintenance of adequate nutrition will improve Outcome: Progressing Goal: Progress toward achieving an optimal weight will improve Outcome: Progressing   Problem: Skin Integrity: Goal: Risk for impaired skin integrity will decrease Outcome: Progressing   Problem: Tissue Perfusion: Goal: Adequacy of tissue perfusion will improve Outcome: Progressing

## 2022-11-17 NOTE — Anesthesia Procedure Notes (Signed)
Procedure Name: Intubation Date/Time: 11/17/2022 11:56 AM  Performed by: Loleta Lamario Mani, CRNAPre-anesthesia Checklist: Patient identified, Patient being monitored, Timeout performed, Emergency Drugs available and Suction available Patient Re-evaluated:Patient Re-evaluated prior to induction Oxygen Delivery Method: Circle system utilized Preoxygenation: Pre-oxygenation with 100% oxygen Induction Type: IV induction Ventilation: Two handed mask ventilation required Laryngoscope Size: Glidescope and 4 Grade View: Grade I Tube type: Oral Tube size: 7.5 mm Number of attempts: 1 Airway Equipment and Method: Stylet Placement Confirmation: ETT inserted through vocal cords under direct vision, positive ETCO2 and breath sounds checked- equal and bilateral Secured at: 23 cm Tube secured with: Tape Dental Injury: Teeth and Oropharynx as per pre-operative assessment

## 2022-11-17 NOTE — H&P (Signed)
See below for H&P details.  Previous times of TCAR was complicated by intraoperative cardiac arrest.  He has been optimized from a cardiac standpoint.  He has symptomatic carotid artery stenosis and needs this addressed.  Will plan for right TCAR today.  Rande Brunt. Lenell Antu, MD FACS [facility] Phone Number: [phone] 11/17/2022 11:34 AM    [facility] [place]  ASSESSMENT / PLAN: [name] is a 67 y.o. male with symptomatic right 60 - 79 % carotid artery stenosis.   The patient should continue best medical therapy for carotid artery stenosis including: Complete cessation from all tobacco products. Blood glucose control with goal A1c < 7%. Blood pressure control with goal blood pressure < 140/90 mmHg. Lipid reduction therapy with goal LDL-C <70  Aspirin 81mg  PO QD.  Clopidogrel 75mg  PO QD. Atorvastatin 40-80mg  PO QD (or other "high intensity" statin therapy).  Patient describes a branch retinal artery occlusion found at the [place].  CHIEF COMPLAINT: Visual disturbance  HISTORY OF PRESENT ILLNESS: [name] is a 67 y.o. male who returns to clinic for evaluation of carotid artery stenosis.  The patient is well-known to me for revascularization efforts in his right leg which were successful to resolve rest pain.  See below for full detail.  The patient describes visual disturbances which were evaluated at the [place].  A funduscopic exam was performed which was worrisome, per the patient's report.  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  This prompted evaluation for symptomatic carotid artery stenosis.  The patient is status post carotid endarterectomy by my partner Dr. Arbie Cookey in 2018.  He did very well in recovery.  He has been undergoing surveillance.  No early restenosis was noted.  Evaluation was worrisome for restenosis of the right internal carotid artery which had been endarterectomized.  A CT angiogram was  performed which revealed about 85% stenosis of the origin of the right internal carotid artery.  I reviewed these findings in detail with the patient.  The detail available to me from the [place] is a bit incomplete.  I was able to review the radiographic reports, as detailed above.  I was not able to review the CT angiogram images personally.  I counseled him about the 2 modes of treatment for symptomatic carotid artery stenosis.  I encouraged him to initiate clopidogrel therapy today.  Ideally, we would treat his recurrent stenosis with a stent procedure to reduce his risk of cranial nerve injury.  He is understanding and willing to proceed.  VASCULAR SURGICAL HISTORY:  Bilateral common iliac "kissing" angioplasty and stenting (RCIA 11x67mm VBX, LCIA 10x51mm VBX) and right iliofemoral endarterectomy and profundaplasty with bovine pericardial patch angioplasty 04/29/21.  Past Medical History:  Diagnosis Date   AICD (automatic cardioverter/defibrillator) present 11/03/2022   Aortic valve sclerosis    aortic valve sclerosis   Arthritis    CAD (coronary artery disease)    Myoview 5/18: 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 no ischemia EF 27%   Carotid artery disease (HCC)    chronic total occlusion LCCA, retrograde flow from LECA to LICA, ,, 0-39%LICA, s/p Right CEA 2018   CHF (congestive heart failure) (HCC)    Closed head injury with concussion    x2   Complication of anesthesia    Low blood pressure after colonoscopy   Diabetes mellitus without complication (HCC)    Dizziness    Ejection fraction < 50%    EF 40%,  echo 2006, no ICD needed  / EF 40%, echo, 11/2009   Former cigarette smoker    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 Georganna Skeans   Sleep disorder    Dr Shelle Iron,  lose weight and consider sleep study later   Sleep disorder    TIA (transient ischemic attack)    by history   Volume overload     Past Surgical History:  Procedure Laterality Date   CHOLECYSTECTOMY     COLONOSCOPY  03/19/2009   CORONARY ARTERY BYPASS GRAFT     ENDARTERECTOMY Right 11/05/2016   Procedure: ENDARTERECTOMY RIGHT CAROTID;  Surgeon: Larina Earthly, MD;  Location: [facility] OR;  Service: Vascular;  Laterality: Right;   ENDARTERECTOMY FEMORAL Right 04/29/2021   Procedure: RIGHT ILIOFEMORAL ARTERY ENDARTERECTOMY WITH PROFUNDOPLASTY;  Surgeon: Leonie Douglas, MD;  Location: [facility] OR;  Service: Vascular;  Laterality: Right;   ICD IMPLANT N/A 11/03/2022   Procedure: ICD IMPLANT;  Surgeon: Regan Lemming, MD;  Location: [facility] INVASIVE CV LAB;  Service: Cardiovascular;  Laterality: N/A;   INSERTION OF ILIAC STENT Right 04/29/2021   Procedure: AORTAGRAM INSERTION OF BILATERAL COMMON ILIAC ARTERY KISSING STENTS;  Surgeon: Leonie Douglas, MD;  Location: [facility] OR;  Service: Vascular;  Laterality: Right;   PATCH ANGIOPLASTY Right 11/05/2016   Procedure: RIGHT CAROTID ARTERY PATCH ANGIOPLASTY USING HEMASHIELD PLATINUM FINESSE PATCH;  Surgeon: Larina Earthly, MD;  Location: [facility] OR;  Service: Vascular;  Laterality: Right;   PATCH ANGIOPLASTY Right 04/29/2021   Procedure: PATCH ANGIOPLASTY OF RIGHT COMMON FEMORAL ARTERY UISNG XENOSURE BOVINE PERICARDIUM PACTH;  Surgeon: Leonie Douglas, MD;  Location: [facility] OR;  Service: Vascular;  Laterality: Right;   RIGHT/LEFT HEART CATH AND CORONARY/GRAFT ANGIOGRAPHY N/A 10/29/2022   Procedure: RIGHT/LEFT HEART CATH AND CORONARY/GRAFT ANGIOGRAPHY;  Surgeon: Tonny Bollman, MD;  Location: [facility] INVASIVE CV LAB;  Service: Cardiovascular;  Laterality: N/A;   SHOULDER ARTHROSCOPY Right 05/25/2018   Procedure: RIGHT ARTHROSCOPY SHOULDER LABRAL DEBRIDEMENT, rotator cuff debridement,  BICEPS TENOTOMY and subacromial decompression;  Surgeon: Jones Broom, MD;  Location: [facility] OR;   Service: Orthopedics;  Laterality: Right;   TRANSCAROTID ARTERY REVASCULARIZATION  Right 10/28/2022   Procedure: Right Transcarotid Artery Revascularization;  Surgeon: Leonie Douglas, MD;  Location: [facility] OR;  Service: Vascular;  Laterality: Right;   ULTRASOUND GUIDANCE FOR VASCULAR ACCESS Left 04/29/2021   Procedure: ULTRASOUND GUIDANCE FOR VASCULAR ACCESS, LEFT FEMORAL ARTERY;  Surgeon: Leonie Douglas, MD;  Location: [facility] OR;  Service: Vascular;  Laterality: Left;    Family History  Problem Relation Age of Onset   Stroke Mother    Diabetes Father    COPD Father    Arthritis Father    Heart disease Father    Coronary artery disease Other        Male 1st degree relative <50   Diabetes Other        1st degree relative   Stroke Other        Male 1st degree relative<50   Deep vein thrombosis Daughter    Pulmonary fibrosis Daughter     Social History   Socioeconomic History   Marital status: Married    Spouse name: Clydie Braun   Number  of children: 2   Years of education: Not on file   Highest education level: High school graduate  Occupational History   Occupation: Retired  Tobacco Use   Smoking status: Former    Types: Cigarettes    Quit date: 05/05/2002    Years since quitting: 20.5    Passive exposure: Never   Smokeless tobacco: Never  Vaping Use   Vaping Use: Never used  Substance and Sexual Activity   Alcohol use: Yes    Alcohol/week: 0.0 standard drinks of alcohol    Comment: beer every now and then   Drug use: No   Sexual activity: Yes  Other Topics Concern   Not on file  Social History Narrative   No Regular exercise   Social Determinants of Health   Financial Resource Strain: Low Risk  (11/01/2022)   Overall Financial Resource Strain (CARDIA)    Difficulty of Paying Living Expenses: Not hard at all  Food Insecurity: No Food Insecurity (11/05/2022)   Hunger Vital Sign    Worried About Running Out of Food in the Last Year: Never true    Ran Out of Food in the  Last Year: Never true  Transportation Needs: No Transportation Needs (11/05/2022)   PRAPARE - Administrator, Civil Service (Medical): No    Lack of Transportation (Non-Medical): No  Physical Activity: Insufficiently Active (07/06/2022)   Exercise Vital Sign    Days of Exercise per Week: 5 days    Minutes of Exercise per Session: 20 min  Stress: No Stress Concern Present (07/06/2022)   [employer] of Occupational Health - Occupational Stress Questionnaire    Feeling of Stress : Not at all  Social Connections: Socially Integrated (07/06/2022)   Social Connection and Isolation Panel [NHANES]    Frequency of Communication with Friends and Family: More than three times a week    Frequency of Social Gatherings with Friends and Family: Once a week    Attends Religious Services: More than 4 times per year    Active Member of [employer] or Organizations: Yes    Attends Engineer, structural: More than 4 times per year    Marital Status: Married  Catering manager Violence: Not At Risk (11/02/2022)   Humiliation, Afraid, Rape, and Kick questionnaire    Fear of Current or Ex-Partner: No    Emotionally Abused: No    Physically Abused: No    Sexually Abused: No    No Known Allergies  Current Facility-Administered Medications  Medication Dose Route Frequency Provider Last Rate Last Admin   0.9 %  sodium chloride infusion   Intravenous Continuous Leonie Douglas, MD       ceFAZolin (ANCEF) IVPB 2g/100 mL premix  2 g Intravenous 30 min Pre-Op Leonie Douglas, MD       Chlorhexidine Gluconate Cloth 2 % PADS 6 each  6 each Topical Once Leonie Douglas, MD       And   Chlorhexidine Gluconate Cloth 2 % PADS 6 each  6 each Topical Once Leonie Douglas, MD       insulin aspart (novoLOG) injection 0-14 Units  0-14 Units Subcutaneous Q2H PRN Shelton Silvas, MD   2 Units at 11/17/22 0910   lactated ringers infusion   Intravenous Continuous Shelton Silvas, MD 10 mL/hr at 11/17/22 1040  Continued from Pre-op at 11/17/22 1040   remifentanil (ULTIVA) 2 mg in 100 mL normal saline (20 mcg/mL) Optime  0.15 mcg/kg/min Intravenous To  OR Loleta Rose, CRNA        PHYSICAL EXAM Vitals:   11/17/22 0849  BP: (!) 179/80  Pulse: 67  Resp: 16  Temp: 98.1 F (36.7 C)  TempSrc: Oral  SpO2: 97%  Weight: 99.8 kg  Height: 6' (1.829 m)    Well-appearing gentleman in no acute distress Regular rate and rhythm Unlabored breathing No focal neurologic signs or symptoms.  PERTINENT LABORATORY AND RADIOLOGIC DATA  Most recent CBC    Latest Ref Rng & Units 11/17/2022    9:30 AM 11/02/2022    1:32 AM 11/01/2022    1:43 AM  CBC  WBC 4.0 - 10.5 K/uL 7.1  5.9  7.6   Hemoglobin 13.0 - 17.0 g/dL 16.1  09.6  04.5   Hematocrit 39.0 - 52.0 % 45.9  39.6  41.4   Platelets 150 - 400 K/uL 178  144  137      Most recent CMP    Latest Ref Rng & Units 11/17/2022    9:30 AM 11/02/2022    1:32 AM 11/01/2022    3:30 PM  CMP  Glucose 70 - 99 mg/dL 409  811  914   BUN 8 - 23 mg/dL 17  15  14    Creatinine 0.61 - 1.24 mg/dL 7.82  9.56  2.13   Sodium 135 - 145 mmol/L 136  136  137   Potassium 3.5 - 5.1 mmol/L 4.1  4.1  4.0   Chloride 98 - 111 mmol/L 104  106  101   CO2 22 - 32 mmol/L 21  24  25    Calcium 8.9 - 10.3 mg/dL 9.3  8.6  9.2   Total Protein 6.5 - 8.1 g/dL 7.4  6.3    Total Bilirubin 0.3 - 1.2 mg/dL 0.7  0.9    Alkaline Phos 38 - 126 U/L 67  48    AST 15 - 41 U/L 24  20    ALT 0 - 44 U/L 29  24      Renal function Estimated Creatinine Clearance: 91.1 mL/min (by C-G formula based on SCr of 0.95 mg/dL).  Hgb A1c MFr Bld (%)  Date Value  10/28/2022 6.5 (H)    LDL Cholesterol  Date Value Ref Range Status  04/30/2021 174 (H) 0 - 99 mg/dL Final    Comment:           Total Cholesterol/HDL:CHD Risk Coronary Heart Disease Risk Table                     Men   Women  1/2 Average Risk   3.4   3.3  Average Risk       5.0   4.4  2 X Average Risk   9.6   7.1  3 X Average Risk  23.4    11.0        Use the calculated Patient Ratio above and the CHD Risk Table to determine the patient's CHD Risk.        ATP III CLASSIFICATION (LDL):  <100     mg/dL   Optimal  086-578  mg/dL   Near or Above                    Optimal  130-159  mg/dL   Borderline  469-629  mg/dL   High  >528     mg/dL   Very High Performed at [facility], [address], [place], [place] [address]  Direct LDL  Date Value Ref Range Status  02/08/2013 198.1 mg/dL Final    Comment:    Optimal:  <100 mg/dLNear or Above Optimal:  100-129 mg/dLBorderline High:  130-159 mg/dLHigh:  160-189 mg/dLVery High:  >190 mg/dL    VA workup reviewed in detail. Right carotid artery stenosis demonstrated on duplex and CT angiogram.  Images not available for me to review.  Rande Brunt. Lenell Antu, MD FACS [facility] Phone Number: [phone] 11/17/2022 11:34 AM   Total time spent on preparing this encounter including chart reestablished patient, 40 minutes.view, data review, collecting history, examining the patient, coordinating care for this established patient, 40 minutes.  Portions of this report may have been transcribed using voice recognition software.  Every effort has been made to ensure accuracy; however, inadvertent computerized transcription errors may still be present.

## 2022-11-18 ENCOUNTER — Encounter (HOSPITAL_COMMUNITY): Payer: Self-pay | Admitting: Vascular Surgery

## 2022-11-18 LAB — BASIC METABOLIC PANEL
Anion gap: 15 (ref 5–15)
BUN: 17 mg/dL (ref 8–23)
CO2: 18 mmol/L — ABNORMAL LOW (ref 22–32)
Calcium: 9.2 mg/dL (ref 8.9–10.3)
Chloride: 100 mmol/L (ref 98–111)
Creatinine, Ser: 1.27 mg/dL — ABNORMAL HIGH (ref 0.61–1.24)
GFR, Estimated: >60 mL/min (ref >60)
Glucose, Bld: 262 mg/dL — ABNORMAL HIGH (ref 70–99)
Potassium: 4 mmol/L (ref 3.5–5.1)
Sodium: 133 mmol/L — ABNORMAL LOW (ref 135–145)

## 2022-11-18 LAB — CBC
HCT: 44.3 % (ref 39.0–52.0)
Hemoglobin: 15.1 g/dL (ref 13.0–17.0)
MCH: 32.1 pg (ref 26.0–34.0)
MCHC: 34.1 g/dL (ref 30.0–36.0)
MCV: 94.1 fL (ref 80.0–100.0)
Platelets: 201 10*3/uL (ref 150–400)
RBC: 4.71 MIL/uL (ref 4.22–5.81)
RDW: 13 % (ref 11.5–15.5)
WBC: 11.6 10*3/uL — ABNORMAL HIGH (ref 4.0–10.5)
nRBC: 0 % (ref 0.0–0.2)

## 2022-11-18 LAB — GLUCOSE, CAPILLARY: Glucose-Capillary: 180 mg/dL — ABNORMAL HIGH (ref 70–99)

## 2022-11-18 MED ORDER — OXYCODONE-ACETAMINOPHEN 5-325 MG PO TABS: 1.0000 | ORAL_TABLET | Freq: Four times a day (QID) | ORAL | 0 refills | Status: DC | PRN

## 2022-11-18 MED ORDER — EMPAGLIFLOZIN 10 MG PO TABS
10.0000 mg | ORAL_TABLET | Freq: Every day | ORAL | Status: DC
  Administered 2022-11-18: 10 mg via ORAL
  Filled 2022-11-18: qty 1

## 2022-11-18 NOTE — Progress Notes (Addendum)
Pt walked this am, ate breakfast, saw Dr Lenell Antu and the EP PA, pt ready for discharge.  Discharge instructions reviewed with pt and his wife.  Copy of instructions given to pt. No changes in medications, pt informed he does have a pain medication script sent to his pharmacy for pick up. Pt and wife verbalize understanding of instructions, how to care for incision and when to call MD. Pt had requested EP staff to come see him while here for his post ICD visit, EP staff PA Keitha Butte came by and saw the pt at this time. Pt ready for discharge.  Pt d/c'd via wheelchair with belongings, with wife.           Escorted by hospital volunteer.  Raushanah Osmundson,RN SWOT

## 2022-11-18 NOTE — Progress Notes (Addendum)
  Progress Note    11/18/2022 6:37 AM 1 Day Post-Op  Subjective:  wants to go home.  No trouble swallowing.  Has walked and voided.  Wants to know if EP can come evaluate his incision before dc.   Tm 99.1 HR 60's-80's NSR 110's-140's systolic 96% RA  Gtts:  none  Vitals:   11/18/22 0212 11/18/22 0416  BP: 125/71 117/72  Pulse: 88 80  Resp: 19 17  Temp: 99.1 F (37.3 C) 99.1 F (37.3 C)  SpO2: 96% 94%     Physical Exam: Neuro:  in tact; tongue is midline Lungs:  non labored Incision:  clean and dry with some ecchymosis.  CBC    Component Value Date/Time   WBC 11.6 (H) 11/18/2022 0149   RBC 4.71 11/18/2022 0149   HGB 15.1 11/18/2022 0149   HCT 44.3 11/18/2022 0149   PLT 201 11/18/2022 0149   MCV 94.1 11/18/2022 0149   MCH 32.1 11/18/2022 0149   MCHC 34.1 11/18/2022 0149   RDW 13.0 11/18/2022 0149   LYMPHSABS 1.5 10/28/2022 1150   MONOABS 0.5 10/28/2022 1150   EOSABS 0.2 10/28/2022 1150   BASOSABS 0.0 10/28/2022 1150    BMET    Component Value Date/Time   NA 133 (L) 11/18/2022 0149   K 4.0 11/18/2022 0149   CL 100 11/18/2022 0149   CO2 18 (L) 11/18/2022 0149   GLUCOSE 262 (H) 11/18/2022 0149   BUN 17 11/18/2022 0149   CREATININE 1.27 (H) 11/18/2022 0149   CALCIUM 9.2 11/18/2022 0149   GFRNONAA >60 11/18/2022 0149   GFRAA >60 05/22/2018 1049     Intake/Output Summary (Last 24 hours) at 11/18/2022 0637 Last data filed at 11/18/2022 0440 Gross per 24 hour  Intake 826.64 ml  Output 20 ml  Net 806.64 ml     Assessment/Plan:  This is a 67 y.o. male who is s/p right TCAR  1 Day Post-Op  -pt is doing well this am. -creatinine increase to 1.27 from 0.95-pt has been voiding.  He is receiving more IVF.  Discussed with him to f/u with PCP early next week to have his renal function checked just to make sure it is stable  -elevated glucose this am-will restart his Jardiance this morning -pt neuro exam is in tact -pt has ambulated -pt has voided -f/u with  [facility] in 4 weeks with carotid duplex on Dr. Lenell Antu clinic day. -will try to touch base with EP and see if they can check his incision today since he is here and possibly save him an office visit as he lives in [place].   Doreatha Massed, PA-C [facility] [phone]  Addendum: spoke with EP PA and they will check him before he discharges today.   Doreatha Massed, PAC   VASCULAR STAFF ADDENDUM: I have independently interviewed and examined the patient. I agree with the above.  Looks great POD#1 R TCAR for symptomatic carotid stenosis Ready for DC.   Rande Brunt. Lenell Antu, MD [facility] Phone Number: [phone] 11/18/2022 8:55 AM

## 2022-11-18 NOTE — Discharge Summary (Signed)
Discharge Summary     [name] 12/27/1955 67 y.o. male  [id number]  Admission Date: 11/17/2022  Discharge Date: 11/18/2022  Physician: Leonie Douglas, MD  Admission Diagnosis: Carotid artery stenosis [I65.29]   HPI:   This is a 67 y.o. male  who returns to clinic for evaluation of carotid artery stenosis.  The patient is well-known to me for revascularization efforts in his right leg which were successful to resolve rest pain.  See below for full detail.  The patient describes visual disturbances which were evaluated at the [place].  A funduscopic exam was performed which was worrisome, per the patient's report.  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  This prompted evaluation for symptomatic carotid artery stenosis.  The patient is status post carotid endarterectomy by my partner Dr. Arbie Cookey in 2018.  He did very well in recovery.  He has been undergoing surveillance.  No early restenosis was noted.  Evaluation was worrisome for restenosis of the right internal carotid artery which had been endarterectomized.  A CT angiogram was performed which revealed about 85% stenosis of the origin of the right internal carotid artery.   I reviewed these findings in detail with the patient.  The detail available to me from the [place] is a bit incomplete.  I was able to review the radiographic reports, as detailed above.  I was not able to review the CT angiogram images personally.   I counseled him about the 2 modes of treatment for symptomatic carotid artery stenosis.  I encouraged him to initiate clopidogrel therapy today.  Ideally, we would treat his recurrent stenosis with a stent procedure to reduce his risk of cranial nerve injury.  He is understanding and willing to proceed.  Hospital Course:  The patient was admitted to the hospital and taken to the operating room on 11/17/2022 and underwent right TCAR    Findings: successful TCAR. Awoke at neurologic baseline   The pt tolerated the  procedure well and was transported to the PACU in good condition.   By POD 1, the pt neuro status was in tact.  He was able to ambulate, swallow and void without difficulty.  EP was called to check incision and follow up.  Pt is discharged home.    Recent Labs    11/17/22 0930 11/18/22 0149  NA 136 133*  K 4.1 4.0  CL 104 100  CO2 21* 18*  GLUCOSE 163* 262*  BUN 17 17  CALCIUM 9.3 9.2   Recent Labs    11/17/22 0930 11/18/22 0149  WBC 7.1 11.6*  HGB 15.7 15.1  HCT 45.9 44.3  PLT 178 201   Recent Labs    11/17/22 0930  INR 1.0     Discharge Instructions     Discharge patient   Complete by: As directed    D/c home once pt has been seen by Dr. Lenell Antu, walked and eaten breakfast.   Discharge disposition: 01-Home or Self Care   Discharge patient date: 11/18/2022       Discharge Diagnosis:  Carotid artery stenosis [I65.29]  Secondary Diagnosis: Patient Active Problem List   Diagnosis Date Noted   Carotid artery stenosis 11/17/2022   Cardiac arrest (HCC) 10/28/2022   Carotid stenosis 10/28/2022   Acute on chronic combined systolic and diastolic CHF (congestive heart failure) (HCC) 10/28/2022   Osteoarthritis 06/30/2021   PAD (peripheral artery disease) (HCC) 04/29/2021   Acute bursitis of left shoulder 02/01/2018   Upper respiratory infection 12/30/2017   Right  rotator cuff tear 12/29/2017   Cramps, extremity 01/20/2017   Asymptomatic stenosis of right carotid artery 11/05/2016   Hearing loss d/t noise 08/08/2015   Shoulder pain, right 08/08/2015   Chest wall pain 09/13/2014   Acute upper respiratory infection 05/15/2014   Obesity 05/15/2014   Ischemic cardiomyopathy 11/09/2013   Chronic systolic CHF (congestive heart failure) (HCC) 11/09/2013   Discoloration of skin 05/04/2013   Nocturnal leg cramps 05/04/2013   PVC's (premature ventricular contractions) 03/30/2013   Dizziness    CAD (coronary artery disease)    Diabetes mellitus type 2 in obese  02/09/2013   Cough 06/14/2012   Neoplasm of uncertain behavior of skin 01/03/2012   Well adult exam 12/13/2011   Hypertension    Claudication (HCC)    TIA (transient ischemic attack)    Aortic valve sclerosis    Ejection fraction < 50%    Hyperlipidemia    Volume overload    Sleep disorder    Mitral regurgitation    BRONCHITIS 05/15/2010   KNEE PAIN 05/15/2010   Actinic keratosis 01/19/2010   NECK PAIN 01/19/2010   LEG PAIN 09/30/2009   SKIN RASH 09/30/2009   SNORING 01/15/2009   ABNORMAL LABS 01/15/2009   OTITIS MEDIA, SEROUS 05/01/2008   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   Past Medical History:  Diagnosis Date   AICD (automatic cardioverter/defibrillator) present 11/03/2022   Aortic valve sclerosis    aortic valve sclerosis   Arthritis    CAD (coronary artery disease)    Myoview 5/18: 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 no ischemia EF 27%   Carotid artery disease (HCC)    chronic total occlusion LCCA, retrograde flow from LECA to LICA, ,, 0-39%LICA, s/p Right CEA 2018   CHF (congestive heart failure) (HCC)    Closed head injury with concussion    x2   Complication of anesthesia    Low blood pressure after colonoscopy   Diabetes mellitus without complication (HCC)    Dizziness    Ejection fraction < 50%    EF 40%, echo 2006, no ICD needed  / EF 40%, echo, 11/2009   Former cigarette smoker    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 Georganna Skeans   Sleep disorder    Dr Shelle Iron, lose weight and consider sleep study later   Sleep disorder    TIA (transient ischemic attack)    by history   Volume overload     Allergies as of 11/18/2022   No Known Allergies      Medication List     TAKE these medications    aspirin 81 MG chewable tablet Chew 81 mg by mouth  daily.   b complex vitamins capsule Take 1 capsule by mouth daily.   carvedilol 6.25 MG tablet Commonly known as: COREG Take 1 tablet (6.25 mg total) by mouth 2 (two) times daily with a meal.   clopidogrel 75 MG tablet Commonly known as: PLAVIX Take 1 tablet (75 mg total) by mouth daily. Restart on Sunday 11/07/22   diclofenac Sodium 1 % Gel Commonly known as: VOLTAREN Apply 2 g topically daily as needed (pain).   Entresto 24-26 MG Generic drug: sacubitril-valsartan Take 1 tablet by  mouth 2 (two) times daily.   isosorbide dinitrate 10 MG tablet Commonly known as: ISORDIL Take 10 mg by mouth 3 (three) times daily.   Jardiance 10 MG Tabs tablet Generic drug: empagliflozin Take 1 tablet (10 mg total) by mouth daily.   magnesium oxide 400 (240 Mg) MG tablet Commonly known as: MAG-OX Take 400 mg by mouth at bedtime.   oxyCODONE-acetaminophen 5-325 MG tablet Commonly known as: Percocet Take 1 tablet by mouth every 6 (six) hours as needed for severe pain.   Ozempic (0.25 or 0.5 MG/DOSE) 2 MG/1.5ML Sopn Generic drug: Semaglutide(0.25 or 0.5MG /DOS) Inject 0.5 mg into the skin once a week. If unavailable, please recommend alternative. What changed: additional instructions   pantoprazole 40 MG tablet Commonly known as: PROTONIX TAKE 1 TABLET (40 MG TOTAL) BY MOUTH DAILY. What changed: when to take this   rosuvastatin 20 MG tablet Commonly known as: CRESTOR Take 10 mg by mouth daily.   spironolactone 25 MG tablet Commonly known as: ALDACTONE Take 25 mg by mouth daily.   Vitamin D3 50 MCG (2000 UT) Tabs Take 2,000 Units by mouth daily.         [facility] Discharge Instructions Carotid Endarterectomy (CEA)  Please refer to the following instructions for your post-procedure care. Your surgeon or physician assistant will discuss any changes with you.  Activity  You are encouraged to walk as much as you can. You can slowly return to  normal activities but must avoid strenuous activity and heavy lifting until your doctor tell you it's OK. Avoid activities such as vacuuming or swinging a golf club. You can drive after one week if you are comfortable and you are no longer taking prescription pain medications. It is normal to feel tired for serval weeks after your surgery. It is also normal to have difficulty with sleep habits, eating, and bowel movements after surgery. These will go away with time.  Bathing/Showering  You may shower after you come home. Do not soak in a bathtub, hot tub, or swim until the incision heals completely.  Incision Care  Shower every day. Clean your incision with mild soap and water. Pat the area dry with a clean towel. You do not need a bandage unless otherwise instructed. Do not apply any ointments or creams to your incision. You may have skin glue on your incision. Do not peel it off. It will come off on its own in about one week. Your incision may feel thickened and raised for several weeks after your surgery. This is normal and the skin will soften over time. For Men Only: It's OK to shave around the incision but do not shave the incision itself for 2 weeks. It is common to have numbness under your chin that could last for several months.  Diet  Resume your normal diet. There are no special food restrictions following this procedure. A low fat/low cholesterol diet is recommended for all patients with vascular disease. In order to heal from your surgery, it is CRITICAL to get adequate nutrition. Your body requires vitamins, minerals, and protein. Vegetables are the best source of vitamins and minerals. Vegetables also provide the perfect balance of protein. Processed food has little nutritional value, so try to avoid this.  Medications  Resume taking all of your medications unless your doctor or physician assistant tells you not to.  If your incision is causing pain, you may take over-the- counter pain  relievers such as acetaminophen (Tylenol). If you  were prescribed a stronger pain medication, please be aware these medications can cause nausea and constipation.  Prevent nausea by taking the medication with a snack or meal. Avoid constipation by drinking plenty of fluids and eating foods with a high amount of fiber, such as fruits, vegetables, and grains.  Do not take Tylenol if you are taking prescription pain medications.  Follow Up  Our office will schedule a follow up appointment 2-3 weeks following discharge.  Please call us immediately for any of the following conditions  Increased pain, redness, drainage (pus) from your incision site. Fever of 101 degrees or higher. If you should develop stroke (slurred speech, difficulty swallowing, weakness on one side of your body, loss of vision) you should call 911 and go to the nearest emergency room.  Reduce your risk of vascular disease:  Stop smoking. If you would like help call [facility] at 1-800-QUIT-NOW ((717) 885-8973) or [place] at [phone])[phone]. Manage your cholesterol Maintain a desired weight Control your diabetes Keep your blood pressure down  If you have any questions, please call the office at [phone].  Prescriptions given: 1.   Roxicet #4 No Refill  Disposition: home  Patient's condition: is Good  Follow up: 1. [facility] in 4 weeks with carotid duplex on Dr. Lenell Antu clinic day. 2.  PCP early next week to check CBC and BMP   Doreatha Massed, PA-C [facility] [phone]   --- For [facility] Registry use ---   Modified Rankin score at D/C (0-6): 0  IV medication needed for:  1. Hypertension: No 2. Hypotension: No  Post-op Complications: No  1. Post-op CVA or TIA: No  If yes: Event classification (right eye, left eye, right cortical, left cortical, verterobasilar, other): n/a  If yes: Timing of event (intra-op, <6 hrs post-op, >=6 hrs post-op, unknown): n/a  2. CN injury: No  If yes: CN  n/a injuried   3. Myocardial infarction: No  If yes: Dx by (EKG or clinical, Troponin): n/a  4.  CHF: No  5.  Dysrhythmia (new): No  6. Wound infection: No  7. Reperfusion symptoms: No  8. Return to OR: No  If yes: return to OR for (bleeding, neurologic, other CEA incision, other):   Discharge medications: Statin use:  Yes ASA use:  Yes   Beta blocker use:  Yes ACE-Inhibitor use:  No  ARB use:  Yes CCB use: No P2Y12 Antagonist use: Yes, [ ]  Plavix, [ ]  Plasugrel, [ ]  Ticlopinine, [ ]  Ticagrelor, [ ]  Other, [ ]  No for medical reason, [ ]  Non-compliant, [ ]  Not-indicated Anti-coagulant use:  No, [ ]  Warfarin, [ ]  Rivaroxaban, [ ]  Dabigatran,

## 2022-11-18 NOTE — Progress Notes (Signed)
Called for wound check visit (since he is here and is due for wound check in effort to reduce his out patient visit burden Device was checked/managed perioperatively by industry Also checked by industry  rep this morning Verbally reports stable lead measurements, acute implant outputs remain and therapies ar on. Steri strips are removed without difficulty Wound edges are approximated well No erythema, edema, heat, no bleeding, hematoma Well healed No signs of infection Remaining LUE restrictions were reviewed No driving restriction re-enforced 3 mo post implant visit appt is in  in place  [place], [place]

## 2022-11-19 ENCOUNTER — Telehealth: Payer: Self-pay

## 2022-11-19 NOTE — TOC Transition Note (Signed)
Transition of Care (TOC) - CM/SW Discharge Note Donn Pierini RN, BSN Transitions of Care Unit 4E- RN Case Manager See Treatment Team for direct phone #   Patient Details  Name: [name] MRN: [id number] Date of Birth: 06/03/1956  Transition of Care [facility]) CM/SW Contact:  Darrold Span, RN Phone Number: 11/19/2022, 12:12 PM   Clinical Narrative:    Pt stable for transition home today, TOC Notified by [facility] liaison -following patient with MD office protocol referral prearranged for [facility] needs- CM has notified Liaison Rhonda of discharge for start of care.   No further TOC needs noted. Family to transport home         Patient Goals and CMS Choice    Office referal  Discharge Placement               Home w/ [facility]          Discharge Plan and Services Additional resources added to the After Visit Summary for                                       Social Determinants of Health (SDOH) Interventions SDOH Screenings   Food Insecurity: No Food Insecurity (11/05/2022)  Housing: Low Risk  (11/01/2022)  Transportation Needs: No Transportation Needs (11/05/2022)  Utilities: Not At Risk (11/02/2022)  Alcohol Screen: Low Risk  (11/01/2022)  Depression (PHQ2-9): Low Risk  (07/06/2022)  Financial Resource Strain: Low Risk  (11/01/2022)  Physical Activity: Insufficiently Active (07/06/2022)  Social Connections: Socially Integrated (07/06/2022)  Stress: No Stress Concern Present (07/06/2022)  Tobacco Use: Medium Risk (11/18/2022)     Readmission Risk Interventions    11/03/2022    2:57 PM  Readmission Risk Prevention Plan  Transportation Screening Complete  PCP or Specialist Appt within 5-7 Days Complete  Home Care Screening Complete  Medication Review (RN CM) Complete

## 2022-11-19 NOTE — Transitions of Care (Post Inpatient/ED Visit) (Signed)
11/19/2022  Name: [name] MRN: [id number] DOB: 03/01/1956  Today's TOC FU Call Status: Today's TOC FU Call Status:: Successful TOC FU Call Competed TOC FU Call Complete Date: 11/19/22  Transition Care Management Follow-up Telephone Call Date of Discharge: 11/18/22 Discharge Facility: [facility]) Type of Discharge: Inpatient Admission Primary Inpatient Discharge Diagnosis:: Carotid Artery Stenosis-TCAR (Transcarotid Artery Revascularization) How have you been since you were released from the hospital?: Better Any questions or concerns?: No  Items Reviewed: Did you receive and understand the discharge instructions provided?: Yes Medications obtained,verified, and reconciled?: Yes (Medications Reviewed) Any new allergies since your discharge?: No Dietary orders reviewed?: Yes Type of Diet Ordered:: Heart Healthy/Low salt/No caffeine Do you have support at home?: Yes People in Home: spouse Name of Support/Comfort Primary Source: Clydie Braun- supportive spouse  Medications Reviewed Today: Medications Reviewed Today     Reviewed by Jodelle Gross, RN (Case Manager) on 11/19/22 at 1412  Med List Status: <None>   Medication Order Taking? Sig Documenting Provider Last Dose Status Informant  aspirin 81 MG chewable tablet [id number] Yes Chew 81 mg by mouth daily. [provider] Taking Active Self           Med Note Wyline Mood, PHILICIA R   Tue Nov 16, 2022  2:45 PM)    b complex vitamins capsule [id number] Yes Take 1 capsule by mouth daily. [provider] Taking Active Self  carvedilol (COREG) 6.25 MG tablet [id number] Yes Take 1 tablet (6.25 mg total) by mouth 2 (two) times daily with a meal. Baglia, Corrina, PA-C Taking Active   Cholecalciferol (VITAMIN D3) 50 MCG (2000 UT) TABS [id number] Yes Take 2,000 Units by mouth daily. [provider] Taking Active Self  clopidogrel (PLAVIX) 75 MG tablet [id number] Yes Take 1 tablet (75 mg total) by mouth daily. Restart  on Sunday 11/07/22 Graceann Congress, PA-C Taking Active            Med Note Wyline Mood, PHILICIA R   Tue Nov 16, 2022  2:45 PM)    diclofenac Sodium (VOLTAREN) 1 % GEL [id number] Yes Apply 2 g topically daily as needed (pain). [provider] Taking Active Self  empagliflozin (JARDIANCE) 10 MG TABS tablet [id number] Yes Take 1 tablet (10 mg total) by mouth daily. Baglia, Corrina, PA-C Taking Active   isosorbide dinitrate (ISORDIL) 10 MG tablet [id number] Yes Take 10 mg by mouth 3 (three) times daily. [provider] Taking Active Self  magnesium oxide (MAG-OX) 400 (240 Mg) MG tablet [id number] Yes Take 400 mg by mouth at bedtime. [provider] Taking Active Self  oxyCODONE-acetaminophen (PERCOCET) 5-325 MG tablet [id number] No Take 1 tablet by mouth every 6 (six) hours as needed for severe pain.  Patient not taking: Reported on 11/19/2022   Dara Lords, PA-C Not Taking Active            Med Note Electa Sniff, [facility]   Fri Nov 19, 2022  2:12 PM) Patient did not pick up prescription  pantoprazole (PROTONIX) 40 MG tablet [id number] Yes TAKE 1 TABLET (40 MG TOTAL) BY MOUTH DAILY.  Patient taking differently: Take 40 mg by mouth at bedtime.   Plotnikov, Georgina Quint, MD Taking Active Self  rosuvastatin (CRESTOR) 20 MG tablet [id number] Yes Take 10 mg by mouth daily. [provider] Taking Active Self  sacubitril-valsartan (ENTRESTO) 24-26 MG [id number] Yes Take 1 tablet by mouth 2 (two) times daily. Baglia, Corrina, PA-C Taking Active   Semaglutide,0.25 or 0.5MG /DOS, (OZEMPIC, 0.25 OR 0.5 MG/DOSE,)  2 MG/1.5ML SOPN [id number] Yes Inject 0.5 mg into the skin once a week. If unavailable, please recommend alternative.  Patient taking differently: Inject 0.5 mg into the skin once a week. If unavailable, please recommend alternative. SAT   Romero Belling, MD Taking Active Self  spironolactone (ALDACTONE) 25 MG tablet [id number] Yes Take 25 mg by mouth daily.  [provider]  Taking Active Self            Home Care and Equipment/Supplies: Were Home Health Services Ordered?: Yes Name of Home Health Agency:: [facility] Has Agency set up a time to come to your home?: Yes First Home Health Visit Date: 11/19/22 Any new equipment or medical supplies ordered?: No  Functional Questionnaire: Do you need assistance with bathing/showering or dressing?: No Do you need assistance with meal preparation?: No Do you need assistance with eating?: No Do you have difficulty maintaining continence: No Do you need assistance with getting out of bed/getting out of a chair/moving?: No Do you have difficulty managing or taking your medications?: No  Follow up appointments reviewed: PCP Follow-up appointment confirmed?: Yes Date of PCP follow-up appointment?: 11/23/22 Follow-up Provider: Dr. Okey Dupre Specialist [facility] Follow-up appointment confirmed?: Yes Date of Specialist follow-up appointment?: 12/09/22 Follow-Up Specialty Provider:: [facility] Do you need transportation to your follow-up appointment?: No Do you understand care options if your condition(s) worsen?: Yes-patient verbalized understanding  Interventions Today    Flowsheet Row Most Recent Value  Chronic Disease   Chronic disease during today's visit Congestive Heart Failure (CHF), Hypertension (HTN)  General Interventions   General Interventions Discussed/Reviewed General Interventions Discussed  Doctor Visits Discussed/Reviewed Doctor Visits Discussed, Doctor Visits Reviewed  PCP/Specialist Visits Compliance with follow-up visit  Pharmacy Interventions   Pharmacy Dicussed/Reviewed Pharmacy Topics Discussed  [complete med reconciliation]       TOC Interventions Today    Flowsheet Row Most Recent Value  TOC Interventions   TOC Interventions Discussed/Reviewed TOC Interventions Discussed, Post op wound/incision care, Post discharge activity limitations per provider       Jodelle Gross, RN, BSN, CCM Care Management Coordinator [facility] Phone: [phone]/Fax: [phone]

## 2022-11-19 NOTE — Telephone Encounter (Signed)
Luisa Hart, RN with [facility] called to verify [facility] orders at start of care for today's visit.  Reviewed pt's chart, returned call for clarification, two identifiers used. HH wants to see pt on a weekly basis and add PT/OT. Verbal orders given. Pt doing well.

## 2022-11-23 ENCOUNTER — Ambulatory Visit (INDEPENDENT_AMBULATORY_CARE_PROVIDER_SITE_OTHER): Payer: Medicare Other | Admitting: Internal Medicine

## 2022-11-23 ENCOUNTER — Encounter: Payer: Self-pay | Admitting: Internal Medicine

## 2022-11-23 ENCOUNTER — Other Ambulatory Visit (INDEPENDENT_AMBULATORY_CARE_PROVIDER_SITE_OTHER): Payer: Medicare Other

## 2022-11-23 VITALS — BP 113/58 | HR 62 | Temp 98.4°F | Ht 72.0 in | Wt 219.0 lb

## 2022-11-23 DIAGNOSIS — Z7984 Long term (current) use of oral hypoglycemic drugs: Secondary | ICD-10-CM

## 2022-11-23 DIAGNOSIS — E118 Type 2 diabetes mellitus with unspecified complications: Secondary | ICD-10-CM

## 2022-11-23 DIAGNOSIS — I6521 Occlusion and stenosis of right carotid artery: Secondary | ICD-10-CM | POA: Diagnosis not present

## 2022-11-23 DIAGNOSIS — I469 Cardiac arrest, cause unspecified: Secondary | ICD-10-CM

## 2022-11-23 DIAGNOSIS — I5020 Unspecified systolic (congestive) heart failure: Secondary | ICD-10-CM

## 2022-11-23 DIAGNOSIS — Z7985 Long-term (current) use of injectable non-insulin antidiabetic drugs: Secondary | ICD-10-CM

## 2022-11-23 LAB — COMPREHENSIVE METABOLIC PANEL
ALT: 23 U/L (ref 0–53)
AST: 17 U/L (ref 0–37)
Albumin: 4.1 g/dL (ref 3.5–5.2)
Alkaline Phosphatase: 68 U/L (ref 39–117)
BUN: 13 mg/dL (ref 6–23)
CO2: 27 mEq/L (ref 19–32)
Calcium: 9.1 mg/dL (ref 8.4–10.5)
Chloride: 101 mEq/L (ref 96–112)
Creatinine, Ser: 0.97 mg/dL (ref 0.40–1.50)
GFR: 80.34 mL/min (ref >60.00)
Glucose, Bld: 112 mg/dL — ABNORMAL HIGH (ref 70–99)
Potassium: 4 mEq/L (ref 3.5–5.1)
Sodium: 137 mEq/L (ref 135–145)
Total Bilirubin: 0.9 mg/dL (ref 0.2–1.2)
Total Protein: 7.3 g/dL (ref 6.0–8.3)

## 2022-11-23 LAB — CBC
HCT: 44.3 % (ref 39.0–52.0)
Hemoglobin: 14.7 g/dL (ref 13.0–17.0)
MCHC: 33.2 g/dL (ref 30.0–36.0)
MCV: 96.5 fl (ref 78.0–100.0)
Platelets: 185 10*3/uL (ref 150.0–400.0)
RBC: 4.59 Mil/uL (ref 4.22–5.81)
RDW: 13.6 % (ref 11.5–15.5)
WBC: 7 10*3/uL (ref 4.0–10.5)

## 2022-11-23 NOTE — Progress Notes (Unsigned)
   Subjective:   Patient ID: [name], male    DOB: March 25, 1956, 67 y.o.   MRN: [id number]  HPI The patient is a 67 YO man coming in for hospital follow up (several recent hospital stays 1 in April with cardiac arrest (VT during procedure and several shocks and cardiac medications) with reduction in cardiac function and cath with severe disease not amenable to revascularization and carotid procedure and recent hospitalization for carotid stenting). He is limited activity still since being home. Eating okay and drinking okay. Denies chest pains or SOB. Denies diarrhea or constipation. No blood in stool. No neurological changes or stroke symptoms. ICD has not fired. Cardiac failure medications changed in hospital. Denies dizziness or syncope.  PMH, [facility], social history reviewed and updated  Review of Systems  Constitutional:  Positive for activity change and fatigue.  HENT: Negative.    Eyes: Negative.   Respiratory:  Negative for cough, chest tightness and shortness of breath.   Cardiovascular:  Negative for chest pain, palpitations and leg swelling.  Gastrointestinal:  Negative for abdominal distention, abdominal pain, constipation, diarrhea, nausea and vomiting.  Musculoskeletal: Negative.   Skin: Negative.   Neurological: Negative.   Psychiatric/Behavioral: Negative.      Objective:  Physical Exam Constitutional:      Appearance: He is well-developed.  HENT:     Head: Normocephalic and atraumatic.  Neck:     Comments: Bruising right neck without bruit on exam Cardiovascular:     Rate and Rhythm: Normal rate and regular rhythm.  Pulmonary:     Effort: Pulmonary effort is normal. No respiratory distress.     Breath sounds: Normal breath sounds. No wheezing or rales.  Abdominal:     General: Bowel sounds are normal. There is no distension.     Palpations: Abdomen is soft.     Tenderness: There is no abdominal tenderness. There is no rebound.  Musculoskeletal:     Cervical  back: Normal range of motion.  Skin:    General: Skin is warm and dry.  Neurological:     Mental Status: He is alert and oriented to person, place, and time.     Coordination: Coordination abnormal.     Vitals:   11/23/22 1459 11/23/22 1504  BP: (!) 113/58 (!) 113/58  Pulse: 62   Temp: 98.4 F (36.9 C)   TempSrc: Oral   SpO2: 98%   Weight: 219 lb (99.3 kg)   Height: 6' (1.829 m)     Assessment & Plan:  Visit time 20 minutes in face to face communication with patient and coordination of care, additional 20 minutes spent in record review, coordination or care, ordering tests, communicating/referring to other healthcare professionals, documenting in medical records all on the same day of the visit for total time 40 minutes spent on the visit.

## 2022-11-23 NOTE — Patient Instructions (Signed)
We will check the labs today. 

## 2022-11-24 ENCOUNTER — Encounter: Payer: Self-pay | Admitting: Internal Medicine

## 2022-11-24 NOTE — Assessment & Plan Note (Signed)
He will continue on jardiance 10 mg daily and he has been off ozempic due to hospitalization over last month. Asked them to resume 0.25 mg weekly and after 1 month increase to 0.5 mg weekly and should return to PCP within 1-2 months for follow up. He is on ACE-I and statin.

## 2022-11-24 NOTE — Assessment & Plan Note (Signed)
With VT during procedure and loss of pulse with several shocks and medications to help abort. He was on vasopressors for some days after this. He did have ICD placement to help reduce risk for SCD due to persistently low EF.

## 2022-11-24 NOTE — Assessment & Plan Note (Addendum)
Recent hospitalization with catheterization and severe disease not amenable to intervention. He had ICD placement due to reduced EF and medications adjusted. Will follow up with heart failure clinic. Weight not increasing since discharge and taking medications. Checking CBC and CMP today. BP low but no dizziness reported. Continue coreg 6.25 mg BID and entresto 24/26 mg BID and isordil 10 mg TID and jardiance 10 mg daily and spironolactone 25 mg daily.

## 2022-11-25 ENCOUNTER — Ambulatory Visit: Payer: Self-pay

## 2022-11-25 ENCOUNTER — Ambulatory Visit: Payer: Medicare Other

## 2022-11-25 NOTE — Patient Outreach (Signed)
  Care Coordination   Initial Visit Note   11/25/2022 Name: [name] MRN: [id number] DOB: [birth year]  [name] is a 67 y.o. year old male who sees Martinez, Lucas Quint, MD for primary care. I spoke with  [name] by phone today.  What matters to the patients health and wellness today?  Admission 10/28/22-11/03/22 cardiac arrest; 11/17/22-11/18/22 right trans carotid artery revascularization. [name] reports he is improving. Confirms that he is active with home health agency- [phone]. [name] and questions if he needs to see a cardiologist outside of the [place]. Last cardiology visit outside of the [place] was with Dr. Elease Martinez 05/10/2018. Also noted Dr. Excell Martinez completed heart cath on 10/29/22 during hospitalization. He reports he will discuss with [facility] to determine if he needs to see a Cardiologist outside of  the [place]. Patient expresses no concerns at this time.  Goals Addressed             This Visit's Progress    assist with health management-continue to improve post hospitalization       Interventions Today    Flowsheet Row Most Recent Value  Chronic Disease   Chronic disease during today's visit Other, Congestive Heart Failure (CHF), Hypertension (HTN)  [right carotid artery stenosis, cardiac arrest]  General Interventions   General Interventions Discussed/Reviewed General Interventions Discussed  Doctor Visits Discussed/Reviewed Doctor Visits Discussed  PCP/Specialist Visits Compliance with follow-up visit  [reviewed provider instructions per office visit 11/23/22]  Exercise Interventions   Exercise Discussed/Reviewed Exercise Discussed  [reinforced importance of performing exercises per therapist recommendations.]  Education Interventions   Provided Verbal Education On When to see the doctor, Medication, Exercise, Nutrition  [reviewed signs/symptoms of CHF and when to call the doctor]  Nutrition Interventions   Nutrition  Discussed/Reviewed Decreasing salt, Nutrition Discussed  Pharmacy Interventions   Pharmacy Dicussed/Reviewed Pharmacy Topics Discussed  [medication reviewed]            SDOH assessments and interventions completed:  No recently completed.  Care Coordination Interventions:  Yes, provided   Follow up plan: Follow up call scheduled for 12/10/22    Encounter Outcome:  Pt. Visit Completed   Lucas Sheriff, RN, MSN, BSN, CCM [facility] Care Coordinator [phone]

## 2022-11-25 NOTE — Patient Instructions (Addendum)
Visit Information  Thank you for taking time to visit with me today. Please don't hesitate to contact me if I can be of assistance to you.   Following are the goals we discussed today:  Continue to take medications as prescribed. Continue to attend provider visits as scheduled Continue to eat healthy, lean meats, vegetables, fruits, avoid saturated and transfats Continue to weight self and monitor for signs/symptoms of worsening HF  Our next appointment is by telephone on 12/10/22 at 1:30 pm  Please call the care guide team at [phone] if you need to cancel or reschedule your appointment.   If you are experiencing a Mental Health or Behavioral Health Crisis or need someone to talk to, please call the Suicide and Crisis Lifeline: 21   Kathyrn Sheriff, RN, MSN, BSN, CCM [facility] Care Coordinator [phone]    Heart Failure Action Plan A heart failure action plan helps you understand what to do when you have symptoms of heart failure. Your action plan is a color-coded plan that lists the symptoms to watch for and indicates what actions to take. If you have symptoms in the red zone, you need medical care right away. If you have symptoms in the yellow zone, you are having problems. If you have symptoms in the green zone, you are doing well. Follow the plan that was created by you and your health care provider. Review your plan each time you visit your health care provider. Red zone These signs and symptoms mean you should get medical help right away: You have trouble breathing when resting. You have a dry cough that is getting worse. You have swelling or pain in your legs or abdomen that is getting worse. You suddenly gain more than 2-3 lb (0.9-1.4 kg) in 24 hours, or more than 5 lb (2.3 kg) in a week. This amount may be more or less depending on your condition. You have trouble staying awake or you feel confused. You have chest pain. You do not have an appetite. You pass out. You have  worsening sadness or depression. If you have any of these symptoms, call your local emergency services (911 in the [place] right away. Do not drive yourself to the hospital. Yellow zone These signs and symptoms mean your condition may be getting worse and you should make some changes: You have trouble breathing when you are active, or you need to sleep with your head raised on extra pillows to help you breathe. You have swelling in your legs or abdomen. You gain 2-3 lb (0.9-1.4 kg) in 24 hours, or 5 lb (2.3 kg) in a week. This amount may be more or less depending on your condition. You get tired easily. You have trouble sleeping. You have a dry cough. If you have any of these symptoms: Contact your health care provider within the next day. Your health care provider may adjust your medicines. Green zone These signs mean you are doing well and can continue what you are doing: You do not have shortness of breath. You have very little swelling or no new swelling. Your weight is stable (no gain or loss). You have a normal activity level. You do not have chest pain or any other new symptoms. Follow these instructions at home: Take over-the-counter and prescription medicines only as told by your health care provider. Weigh yourself daily. Your target weight is __________ lb (__________ kg). Call your health care provider if you gain more than __________ lb (__________ kg) in 24 hours, or more  than __________ lb (__________ kg) in a week. Health care provider name: _____________________________________________________ Health care provider phone number: _____________________________________________________ Eat a heart-healthy diet. Work with a diet and nutrition specialist (dietitian) to create an eating plan that is best for you. Keep all follow-up visits. This is important. Where to find more information American Heart Association: Summary A heart failure action plan helps you understand what  to do when you have symptoms of heart failure. Follow the action plan that was created by you and your health care provider. Get help right away if you have any symptoms in the red zone. This information is not intended to replace advice given to you by your health care provider. Make sure you discuss any questions you have with your health care provider. Document Revised: 09/29/2021 Document Reviewed: 02/04/2020 Elsevier Patient Education  2024 [employer].

## 2022-12-04 ENCOUNTER — Other Ambulatory Visit: Payer: Self-pay | Admitting: Internal Medicine

## 2022-12-06 ENCOUNTER — Encounter: Payer: Self-pay | Admitting: Internal Medicine

## 2022-12-07 ENCOUNTER — Other Ambulatory Visit: Payer: Self-pay | Admitting: *Deleted

## 2022-12-07 DIAGNOSIS — I6521 Occlusion and stenosis of right carotid artery: Secondary | ICD-10-CM

## 2022-12-08 ENCOUNTER — Ambulatory Visit (HOSPITAL_COMMUNITY)
Admission: RE | Admit: 2022-12-08 | Discharge: 2022-12-08 | Disposition: A | Discharge status: Home / Self Care | Payer: Medicare Other | Source: Ambulatory Visit | Attending: Cardiology | Admitting: Cardiology

## 2022-12-08 ENCOUNTER — Encounter (HOSPITAL_COMMUNITY): Payer: Self-pay | Admitting: Cardiology

## 2022-12-08 VITALS — BP 102/60 | HR 76 | Wt 219.2 lb

## 2022-12-08 DIAGNOSIS — Z7985 Long-term (current) use of injectable non-insulin antidiabetic drugs: Secondary | ICD-10-CM | POA: Diagnosis not present

## 2022-12-08 DIAGNOSIS — I110 Hypertensive heart disease with heart failure: Secondary | ICD-10-CM | POA: Insufficient documentation

## 2022-12-08 DIAGNOSIS — Z87891 Personal history of nicotine dependence: Secondary | ICD-10-CM | POA: Insufficient documentation

## 2022-12-08 DIAGNOSIS — I25810 Atherosclerosis of coronary artery bypass graft(s) without angina pectoris: Secondary | ICD-10-CM | POA: Diagnosis not present

## 2022-12-08 DIAGNOSIS — Z8679 Personal history of other diseases of the circulatory system: Secondary | ICD-10-CM | POA: Diagnosis not present

## 2022-12-08 DIAGNOSIS — I255 Ischemic cardiomyopathy: Secondary | ICD-10-CM | POA: Insufficient documentation

## 2022-12-08 DIAGNOSIS — Z7902 Long term (current) use of antithrombotics/antiplatelets: Secondary | ICD-10-CM | POA: Insufficient documentation

## 2022-12-08 DIAGNOSIS — Z95810 Presence of automatic (implantable) cardiac defibrillator: Secondary | ICD-10-CM | POA: Insufficient documentation

## 2022-12-08 DIAGNOSIS — Z8674 Personal history of sudden cardiac arrest: Secondary | ICD-10-CM | POA: Insufficient documentation

## 2022-12-08 DIAGNOSIS — Z79899 Other long term (current) drug therapy: Secondary | ICD-10-CM | POA: Insufficient documentation

## 2022-12-08 DIAGNOSIS — I5022 Chronic systolic (congestive) heart failure: Secondary | ICD-10-CM | POA: Diagnosis not present

## 2022-12-08 DIAGNOSIS — I2510 Atherosclerotic heart disease of native coronary artery without angina pectoris: Secondary | ICD-10-CM | POA: Diagnosis not present

## 2022-12-08 DIAGNOSIS — I739 Peripheral vascular disease, unspecified: Secondary | ICD-10-CM

## 2022-12-08 DIAGNOSIS — E785 Hyperlipidemia, unspecified: Secondary | ICD-10-CM | POA: Insufficient documentation

## 2022-12-08 DIAGNOSIS — Z7984 Long term (current) use of oral hypoglycemic drugs: Secondary | ICD-10-CM | POA: Diagnosis not present

## 2022-12-08 DIAGNOSIS — E1151 Type 2 diabetes mellitus with diabetic peripheral angiopathy without gangrene: Secondary | ICD-10-CM | POA: Insufficient documentation

## 2022-12-08 DIAGNOSIS — Z7982 Long term (current) use of aspirin: Secondary | ICD-10-CM | POA: Diagnosis not present

## 2022-12-08 DIAGNOSIS — Z8673 Personal history of transient ischemic attack (TIA), and cerebral infarction without residual deficits: Secondary | ICD-10-CM | POA: Diagnosis not present

## 2022-12-08 LAB — BASIC METABOLIC PANEL
Anion gap: 10 (ref 5–15)
BUN: 13 mg/dL (ref 8–23)
CO2: 26 mmol/L (ref 22–32)
Calcium: 8.9 mg/dL (ref 8.9–10.3)
Chloride: 100 mmol/L (ref 98–111)
Creatinine, Ser: 1.01 mg/dL (ref 0.61–1.24)
GFR, Estimated: >60 mL/min (ref >60)
Glucose, Bld: 117 mg/dL — ABNORMAL HIGH (ref 70–99)
Potassium: 4.1 mmol/L (ref 3.5–5.1)
Sodium: 136 mmol/L (ref 135–145)

## 2022-12-08 LAB — LIPID PANEL
Cholesterol: 133 mg/dL (ref 0–200)
HDL: 34 mg/dL — ABNORMAL LOW (ref >40)
LDL Cholesterol: 68 mg/dL (ref 0–99)
Total CHOL/HDL Ratio: 3.9 RATIO
Triglycerides: 155 mg/dL — ABNORMAL HIGH (ref <150)
VLDL: 31 mg/dL (ref 0–40)

## 2022-12-08 LAB — BRAIN NATRIURETIC PEPTIDE: B Natriuretic Peptide: 60.7 pg/mL (ref 0.0–100.0)

## 2022-12-08 NOTE — Patient Instructions (Signed)
There has been no changes to your medications.  Labs done today, your results will be available in MyChart, we will contact you for abnormal readings.  Your physician has requested that you have an echocardiogram. Echocardiography is a painless test that uses sound waves to create images of your heart. It provides your doctor with information about the size and shape of your heart and how well your heart's chambers and valves are working. This procedure takes approximately one hour. There are no restrictions for this procedure. Please do NOT wear cologne, perfume, aftershave, or lotions (deodorant is allowed). Please arrive 15 minutes prior to your appointment time.  Your physician recommends that you schedule a follow-up appointment in: 1 month with an echocardiogram   If you have any questions or concerns before your next appointment please send [place] a message through [place] or call our office at [phone].    TO LEAVE A MESSAGE FOR THE NURSE SELECT OPTION 2, PLEASE LEAVE A MESSAGE INCLUDING: YOUR NAME DATE OF BIRTH CALL BACK NUMBER REASON FOR CALL**this is important as we prioritize the call backs  YOU WILL RECEIVE A CALL BACK THE SAME DAY AS LONG AS YOU CALL BEFORE 4:00 PM  At the [facility], you and your health needs are our priority. As part of our continuing mission to provide you with exceptional heart care, we have created designated Provider Care Teams. These Care Teams include your primary Cardiologist (physician) and Advanced Practice Providers (APPs- Physician Assistants and Nurse Practitioners) who all work together to provide you with the care you need, when you need it.   You may see any of the following providers on your designated Care Team at your next follow up: Dr Arvilla Meres Dr Marca Ancona Dr. Marcos Eke, NP Robbie Lis, [place] [facility] [place], [place] Brynda Peon, NP Karle Plumber, PharmD   Please be sure  to bring in all your medications bottles to every appointment.    Thank you for choosing [facility]

## 2022-12-08 NOTE — Progress Notes (Signed)
ADVANCED HEART FAILURE CLINIC NOTE  Referring Physician: Tresa Garter, MD  Primary Care: Tresa Garter, MD Primary Cardiologist:  HPI: [name] is a 67 y.o. male with CAD status post CABG x 4 in July 2003, chronic systolic heart failure, carotid artery disease status post right CEA and patch angioplasty in May 2018, PAD status post right iliofemoral endarterectomy and stenting of bilateral common iliac arteries in October 2022, hypertension, hyperlipidemia, type 2 diabetes, history of TIA and VT arrest presenting today to establish care.  He was most recently hospitalized in April 2024 after he suffered VT arrest during sedation/intubation for planned right transcarotid artery revascularization.  Echocardiogram shortly after with LVEF less than 20% and moderately reduced RV function.  He underwent right and left heart catheterization that showed total occlusion of the proximal LAD, CTO of the distal circumflex, subtotal occlusion of the proximal and mid circumflex and CTO of the RCA with patency of the RIMA to LAD and free LIMA to diagonal; occlusion of SVG to OM 2 and SVG to PDA.  Cardiac index at that time of 2.1.  He had a follow-up cardiac MRI with LVEF of 23% and underwent secondary mention ICD.  He was also started on amiodarone for a brief duration.  Interval hx: Since discharge from the hospital he has undergone successful right TCAR. From a functional standpoint, he is actually doing very well. He walks everyday with minimal dyspnea or CP; no overt signs of claudication from LE PAD. Compliant with all medications. Lives at home with his wife.   Activity level/exercise tolerance:  NYHA II-III Orthopnea:  Sleeps on 2 pillows Paroxysmal noctural dyspnea:  No Chest pain/pressure:  No Orthostatic lightheadedness:  No Palpitations:  No Lower extremity edema:  No Presyncope/syncope:  No Cough:  No  Past Medical History:  Diagnosis Date   AICD (automatic  cardioverter/defibrillator) present 11/03/2022   Aortic valve sclerosis    aortic valve sclerosis   Arthritis    CAD (coronary artery disease)    Myoview 5/18: 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 no ischemia EF 27%   Carotid artery disease (HCC)    chronic total occlusion LCCA, retrograde flow from LECA to LICA, ,, 0-39%LICA, s/p Right CEA 2018   CHF (congestive heart failure) (HCC)    Closed head injury with concussion    x2   Complication of anesthesia    Low blood pressure after colonoscopy   Diabetes mellitus without complication (HCC)    Dizziness    Ejection fraction < 50%    EF 40%, echo 2006, no ICD needed  / EF 40%, echo, 11/2009   Former cigarette smoker    Hx of CABG 01/2002   01/2002   Hyperlipidemia    Low HDL   Hypertension    Mitral regurgitation    mild, echo, 11/2009   Myocardial infarction [facility])    "they told me i had a silent heart attack"   Peripheral vascular disease (HCC)    Dr. Georganna Skeans   Sleep disorder    Dr Shelle Iron, lose weight and consider sleep study later   Sleep disorder    TIA (transient ischemic attack)    by history   Volume overload     Current Outpatient Medications  Medication Sig Dispense Refill   aspirin 81 MG chewable tablet Chew 81 mg by mouth daily.     b complex vitamins capsule Take 1 capsule by mouth daily.     carvedilol (  COREG) 6.25 MG tablet Take 1 tablet (6.25 mg total) by mouth 2 (two) times daily with a meal. 60 tablet 2   Cholecalciferol (VITAMIN D3) 50 MCG (2000 UT) TABS Take 2,000 Units by mouth daily.     clopidogrel (PLAVIX) 75 MG tablet Take 1 tablet (75 mg total) by mouth daily. Restart on Sunday 11/07/22 30 tablet 6   diclofenac Sodium (VOLTAREN) 1 % GEL Apply 2 g topically daily as needed (pain).     diphenhydrAMINE (BENADRYL ALLERGY) 25 MG tablet Take 25 mg by mouth as needed.     empagliflozin (JARDIANCE) 10 MG TABS tablet Take 1 tablet (10 mg total) by mouth daily. 30 tablet 3   isosorbide  dinitrate (ISORDIL) 10 MG tablet Take 10 mg by mouth 3 (three) times daily.     loratadine (CLARITIN) 10 MG tablet Take 10 mg by mouth daily.     magnesium oxide (MAG-OX) 400 (240 Mg) MG tablet Take 400 mg by mouth at bedtime.     pantoprazole (PROTONIX) 40 MG tablet TAKE 1 TABLET (40 MG TOTAL) BY MOUTH DAILY. 90 tablet 0   rosuvastatin (CRESTOR) 20 MG tablet Take 10 mg by mouth daily.     sacubitril-valsartan (ENTRESTO) 24-26 MG Take 1 tablet by mouth 2 (two) times daily. 60 tablet 3   Semaglutide,0.25 or 0.5MG /DOS, (OZEMPIC, 0.25 OR 0.5 MG/DOSE,) 2 MG/1.5ML SOPN Inject 0.5 mg into the skin once a week. If unavailable, please recommend alternative. 1.5 mL 3   spironolactone (ALDACTONE) 25 MG tablet Take 25 mg by mouth daily.      No current facility-administered medications for this encounter.    No Known Allergies    Social History   Socioeconomic History   Marital status: Married    Spouse name: Clydie Braun   Number of children: 2   Years of education: Not on file   Highest education level: Associate degree: occupational, Scientist, product/process development, or vocational program  Occupational History   Occupation: Retired  Tobacco Use   Smoking status: Former    Types: Cigarettes    Quit date: 05/05/2002    Years since quitting: 20.6    Passive exposure: Never   Smokeless tobacco: Never  Vaping Use   Vaping Use: Never used  Substance and Sexual Activity   Alcohol use: Yes    Alcohol/week: 0.0 standard drinks of alcohol    Comment: beer every now and then   Drug use: No   Sexual activity: Yes  Other Topics Concern   Not on file  Social History Narrative   No Regular exercise   Social Determinants of Health   Financial Resource Strain: Low Risk  (11/20/2022)   Overall Financial Resource Strain (CARDIA)    Difficulty of Paying Living Expenses: Not very hard  Food Insecurity: No Food Insecurity (11/20/2022)   Hunger Vital Sign    Worried About Running Out of Food in the Last Year: Never true    Ran  Out of Food in the Last Year: Never true  Transportation Needs: No Transportation Needs (11/20/2022)   PRAPARE - Administrator, Civil Service (Medical): No    Lack of Transportation (Non-Medical): No  Physical Activity: Insufficiently Active (11/20/2022)   Exercise Vital Sign    Days of Exercise per Week: 5 days    Minutes of Exercise per Session: 10 min  Stress: No Stress Concern Present (11/20/2022)   [employer] of Occupational Health - Occupational Stress Questionnaire    Feeling of Stress : Only a  little  Social Connections: Socially Integrated (11/20/2022)   Social Connection and Isolation Panel [NHANES]    Frequency of Communication with Friends and Family: More than three times a week    Frequency of Social Gatherings with Friends and Family: Once a week    Attends Religious Services: More than 4 times per year    Active Member of [employer] or Organizations: Yes    Attends Engineer, structural: More than 4 times per year    Marital Status: Married  Catering manager Violence: Not At Risk (11/02/2022)   Humiliation, Afraid, Rape, and Kick questionnaire    Fear of Current or Ex-Partner: No    Emotionally Abused: No    Physically Abused: No    Sexually Abused: No      Family History  Problem Relation Age of Onset   Stroke Mother    Diabetes Father    COPD Father    Arthritis Father    Heart disease Father    Coronary artery disease Other        Male 1st degree relative <50   Diabetes Other        1st degree relative   Stroke Other        Male 1st degree relative<50   Deep vein thrombosis Daughter    Pulmonary fibrosis Daughter     PHYSICAL EXAM: Vitals:   12/08/22 1422  BP: 102/60  Pulse: 76  SpO2: 96%   GENERAL: Well nourished, well developed, and in no apparent distress at rest.  HEENT: Negative for arcus senilis or xanthelasma. There is no scleral icterus.  The mucous membranes are pink and moist.   NECK: Supple, No masses. Normal  carotid upstrokes without bruits. No masses or thyromegaly.    CHEST: There are no chest wall deformities. There is no chest wall tenderness. Respirations are unlabored.  Lungs- CTA B/L CARDIAC:  JVP: 7 cm H2O         Normal S1, S2  Normal rate with regular rhythm. No murmurs, rubs or gallops.  Pulses are 2+ and symmetrical in upper and lower extremities. No edema.  ABDOMEN: Soft, non-tender, non-distended. There are no masses or hepatomegaly. There are normal bowel sounds.  EXTREMITIES: Warm and well perfused with no cyanosis, clubbing.  LYMPHATIC: No axillary or supraclavicular lymphadenopathy.  NEUROLOGIC: Patient is oriented x3 with no focal or lateralizing neurologic deficits.  PSYCH: Patients affect is appropriate, there is no evidence of anxiety or depression.  SKIN: Warm and dry; no lesions or wounds.   DATA REVIEW  ECG: 12/08/22: NSR  As per my personal interpretation  ECHO: TTE: Post arrest (4/24): EF < 20%, RV moderately down TTE: (2015): 30-35%  CATH: R/LHC (4/24): severe native vessel coronary artery disease with total occlusion of the proximal LAD, subtotal occlusion of the proximal and mid circumflex, total occlusion of the distal circumflex, and total occlusion of the midportion of the nondominant RCA; s/p aortocoronary bypass surgery with continued patency of the RIMA to LAD and free LIMA to diagonal, total occlusion of the saphenous vein graft to OM 2 and saphenous vein graft to left PDA.  CO/CI 4.75/2.12, mild pulm htn with mean PA 27, PVR 2.5 WU. mRA 9, mPCWP 15   CMR (4/24):  LVEF 23%, RVEF 54%, LGE consistent with infarct in basal to mid inferior wall and basal to apical lateral wall.   ASSESSMENT & PLAN:  Heart failure with reduced ejection fraction Etiology of HF: Ischemic cardiomyopathy, left heart cath from 4/24  noted above with severe multivessel native coronary artery disease and now occlusion of the saphenous vein grafts.  Cardiac MRI with a EF of 23% and  severe LGE in the basal to mid inferior wall NYHA class / AHA Stage:II Volume status & Diuretics: Entresto 24/26 mg twice daily Vasodilators: Entresto 24/26 mg twice daily, ISDN 10 3 times daily Beta-Blocker: Coreg 6.25 mg twice daily, limited by bradycardia. MRA: Spironolactone 25 mg daily Cardiometabolic: Jardiance 10 mg Devices therapies & Valvulopathies: Secondary prevention ICD placed in April 2024 Advanced therapies: Repeat TTE at follow up; will discuss need for advanced therapies at that time. His severe LE PAD may limit options. Cardiac index of 2.1 in 4/24; I suspect this EF and index have improved.   2.  Coronary artery disease status post CABG - s/p CABG x4 (LIMA as a free graft to the diagonal, RIMA to LAD, SVG to OM 2, and SVG to PDA) in 01/2002 with Dr. Dorris Fetch  - [facility] 4/24 showed severe native vessel coronary artery disease with total occlusion of the proximal LAD and subtotal occlusion of the proximal and mid circumflex, total occlusion of the distal circumflex, and total occlusion of the midportion of the nondominant RCA, continued patency of the RIMA to LAD and free LIMA to diagonal.  Total occlusion of the saphenous vein graft to OM 2 and saphenous vein graft to left PDA. No targets for intervention  - stable w/o CP  - continue medical management (on ASA, Plavix, Statin + ? blocker)   3. VT Arrest - followed by Dr. Elberta Fortis; likely due to underlying ischemic heart disease with occluded SVG during intubation/sedation.   4. 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 Aborted attempt at rt trans carotid revascularization 10/29/22 per above  - right TCAR 11/18/22 by Dr. Lenell Antu - on ASA, Plavix + statin   4.  T2DM - A1C of 6.5 - Jardiance 10 - Ozempic  5. HLD - Repeat lipid panel today - If elevated will start Repatha/inclisirin.   Sylva Overley Advanced [facility] Failure Mechanical Circulatory Support

## 2022-12-10 ENCOUNTER — Ambulatory Visit: Payer: Self-pay

## 2022-12-10 NOTE — Patient Outreach (Signed)
  Care Coordination   Follow Up Visit Note   12/10/2022 Name: [name] MRN: [id number] DOB: Mar 31, 1956  [name] is a 67 y.o. year old male who sees Plotnikov, Georgina Quint, MD for primary care. I spoke with  [name] by phone today.  What matters to the patients health and wellness today?  [name] reports he continues to be seen by [facility] for some things, but he is now able to see community physicians for health needs such as cardiologist. Currently active with home health. He reports he is improving.   Goals Addressed             This Visit's Progress    assist with health management-continue to improve post hospitalization       Interventions Today    Flowsheet Row Most Recent Value  Chronic Disease   Chronic disease during today's visit Diabetes, Other, Congestive Heart Failure (CHF), Hypertension (HTN)  [right carotid artery stenosis, cardiac arrest]  General Interventions   General Interventions Discussed/Reviewed General Interventions Reviewed, Doctor Visits  Doctor Visits Discussed/Reviewed Doctor Visits Discussed, PCP, Specialist  PCP/Specialist Visits Compliance with follow-up visit  [reviewed upcoming appointments]  Exercise Interventions   Exercise Discussed/Reviewed Exercise Reviewed  [continues with home health agency]  Education Interventions   Education Provided Provided Education  [advised patient to contact PCP regarding need for [facility]  Provided Verbal Education On Other  [encouraged to continue to monitor for signs/symptoms of HF exacerbation,  reiterated the specialist involved in patient care and there role, medications reviewed-encouraged patient to refill medications at least a week prior to running out of medications.]  Pharmacy Interventions   Pharmacy Dicussed/Reviewed Pharmacy Topics Reviewed            SDOH assessments and interventions completed:  No    Care Coordination Interventions:  Yes, provided    Follow up plan: Follow up call scheduled for 01/13/23    Encounter Outcome:  Pt. Visit Completed   Kathyrn Sheriff, RN, MSN, BSN, CCM [facility] Care Coordinator [phone]

## 2022-12-10 NOTE — Patient Instructions (Addendum)
Visit Information  Thank you for taking time to visit with me today. Please don't hesitate to contact me if I can be of assistance to you.   Following are the goals we discussed today:  Continue to take medications as prescribed Refill medications at least one week prior to running out. Contact provider with questions or concerns Contact provider with any health questions or concern Continue to weigh self daily and monitor for signs/symptoms of HF exacerbation: weight gain 3-4 pound weight gain in 1-2 days or 2 pounds overnight; Shortness of breath, with or without a dry hacking cough; Swelling in the hands, feet or stomach; If you have to sleep on extra pillows at night in order to breathe Continue to attend provider appointments as scheduled  Our next appointment is by telephone on 01/13/23 at 1:00 pm  Please call the care guide team at [phone] if you need to cancel or reschedule your appointment.   If you are experiencing a Mental Health or Behavioral Health Crisis or need someone to talk to, please call the Suicide and Crisis Lifeline: 48  Kathyrn Sheriff, RN, MSN, BSN, CCM [facility] Care Coordinator [phone]

## 2022-12-11 ENCOUNTER — Other Ambulatory Visit: Payer: Self-pay | Admitting: Internal Medicine

## 2022-12-13 NOTE — Progress Notes (Unsigned)
[facility] [place]  ASSESSMENT / PLAN: [name] is a 67 y.o. male status post R TCAR 11/17/22  The patient should continue best medical therapy for carotid artery stenosis including: Complete cessation from all tobacco products. Blood glucose control with goal A1c < 7%. Blood pressure control with goal blood pressure < 140/90 mmHg. Lipid reduction therapy with goal LDL-C <70  Aspirin 81mg  PO QD.  Discontinue clopidogrel. Atorvastatin 40-80mg  PO QD (or other "high intensity" statin therapy).  Doing well overall. Follow up with me in 1 year with carotid duplex and ABI.   CHIEF COMPLAINT: Visual disturbance  HISTORY OF PRESENT ILLNESS: [name] is a 67 y.o. male who returns to clinic for evaluation of carotid artery stenosis.  The patient is well-known to me for revascularization efforts in his right leg which were successful to resolve rest pain.  See below for full detail.  The patient describes visual disturbances which were evaluated at the [place].  A funduscopic exam was performed which was worrisome, per the patient's report.  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  This prompted evaluation for symptomatic carotid artery stenosis.  The patient is status post carotid endarterectomy by my partner Dr. Arbie Cookey in 2018.  He did very well in recovery.  He has been undergoing surveillance.  No early restenosis was noted.  Evaluation was worrisome for restenosis of the right internal carotid artery which had been endarterectomized.  A CT angiogram was performed which revealed about 85% stenosis of the origin of the right internal carotid artery.  I reviewed these findings in detail with the patient.  The detail available to me from the [place] is a bit incomplete.  I was able to review the radiographic reports, as detailed above.  I was not able to review the CT angiogram images personally.  I counseled him about the 2 modes of treatment for symptomatic carotid  artery stenosis.  I encouraged him to initiate clopidogrel therapy today.  Ideally, we would treat his recurrent stenosis with a stent procedure to reduce his risk of cranial nerve injury.  He is understanding and willing to proceed.  12/14/22: Doing well overall.  Recovered completely after his TCAR.  No new cardiac events.  He is increasing his exercise tolerance.  He is following with cardiology team at [facility] now, and his current transition his care away from the [place].  He still describes some cramping discomfort in his legs consistent with claudication.  He has no rest pain or ischemic ulceration.  VASCULAR SURGICAL HISTORY:  Bilateral common iliac "kissing" angioplasty and stenting (RCIA 11x79mm VBX, LCIA 10x34mm VBX) and right iliofemoral endarterectomy and profundaplasty with bovine pericardial patch angioplasty 04/29/21.  Past Medical History:  Diagnosis Date   AICD (automatic cardioverter/defibrillator) present 11/03/2022   Aortic valve sclerosis    aortic valve sclerosis   Arthritis    CAD (coronary artery disease)    Myoview 5/18: 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 no ischemia EF 27%   Carotid artery disease (HCC)    chronic total occlusion LCCA, retrograde flow from LECA to LICA, ,, 0-39%LICA, s/p Right CEA 2018   CHF (congestive heart failure) (HCC)    Closed head injury with concussion    x2   Complication of anesthesia    Low blood pressure after colonoscopy   Diabetes mellitus without complication (HCC)    Dizziness    Ejection fraction < 50%    EF 40%, echo 2006, no ICD  needed  / EF 40%, echo, 11/2009   Former cigarette smoker    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 Georganna Skeans   Sleep disorder    Dr Shelle Iron, lose weight and consider sleep study later    Sleep disorder    TIA (transient ischemic attack)    by history   Volume overload     Past Surgical History:  Procedure Laterality Date   CHOLECYSTECTOMY     COLONOSCOPY  03/19/2009   CORONARY ARTERY BYPASS GRAFT     ENDARTERECTOMY Right 11/05/2016   Procedure: ENDARTERECTOMY RIGHT CAROTID;  Surgeon: Larina Earthly, MD;  Location: [facility] OR;  Service: Vascular;  Laterality: Right;   ENDARTERECTOMY FEMORAL Right 04/29/2021   Procedure: RIGHT ILIOFEMORAL ARTERY ENDARTERECTOMY WITH PROFUNDOPLASTY;  Surgeon: Leonie Douglas, MD;  Location: [facility] OR;  Service: Vascular;  Laterality: Right;   ICD IMPLANT N/A 11/03/2022   Procedure: ICD IMPLANT;  Surgeon: Regan Lemming, MD;  Location: [facility] INVASIVE CV LAB;  Service: Cardiovascular;  Laterality: N/A;   INSERTION OF ILIAC STENT Right 04/29/2021   Procedure: AORTAGRAM INSERTION OF BILATERAL COMMON ILIAC ARTERY KISSING STENTS;  Surgeon: Leonie Douglas, MD;  Location[facility] OR;  Service: Vascular;  Laterality: Right;   PATCH ANGIOPLASTY Right 11/05/2016   Procedure: RIGHT CAROTID ARTERY PATCH ANGIOPLASTY USING HEMASHIELD PLATINUM FINESSE PATCH;  Surgeon: Larina Earthly, MD;  Location: [facility] OR;  Service: Vascular;  Laterality: Right;   PATCH ANGIOPLASTY Right 04/29/2021   Procedure: PATCH ANGIOPLASTY OF RIGHT COMMON FEMORAL ARTERY UISNG XENOSURE BOVINE PERICARDIUM PACTH;  Surgeon: Leonie Douglas, MD;  Location: [facility] OR;  Service: Vascular;  Laterality: Right;   RIGHT/LEFT HEART CATH AND CORONARY/GRAFT ANGIOGRAPHY N/A 10/29/2022   Procedure: RIGHT/LEFT HEART CATH AND CORONARY/GRAFT ANGIOGRAPHY;  Surgeon: Tonny Bollman, MD;  Location: [facility] INVASIVE CV LAB;  Service: Cardiovascular;  Laterality: N/A;   SHOULDER ARTHROSCOPY Right 05/25/2018   Procedure: RIGHT ARTHROSCOPY SHOULDER LABRAL DEBRIDEMENT, rotator cuff debridement,  BICEPS TENOTOMY and subacromial decompression;  Surgeon: Jones Broom, MD;  Location: [facility] OR;  Service: Orthopedics;  Laterality: Right;    TRANSCAROTID ARTERY REVASCULARIZATION  Right 10/28/2022   Procedure: Right Transcarotid Artery Revascularization;  Surgeon: Leonie Douglas, MD;  Location: [facility] OR;  Service: Vascular;  Laterality: Right;   TRANSCAROTID ARTERY REVASCULARIZATION  Right 11/17/2022   Procedure: Right Transcarotid Artery Revascularization;  Surgeon: Leonie Douglas, MD;  Location: [facility] OR;  Service: Vascular;  Laterality: Right;   ULTRASOUND GUIDANCE FOR VASCULAR ACCESS Left 04/29/2021   Procedure: ULTRASOUND GUIDANCE FOR VASCULAR ACCESS, LEFT FEMORAL ARTERY;  Surgeon: Leonie Douglas, MD;  Location: [facility] OR;  Service: Vascular;  Laterality: Left;    Family History  Problem Relation Age of Onset   Stroke Mother    Diabetes Father    COPD Father    Arthritis Father    Heart disease Father    Coronary artery disease Other        Male 1st degree relative <50   Diabetes Other        1st degree relative   Stroke Other        Male 1st degree relative<50   Deep vein thrombosis Daughter  Pulmonary fibrosis Daughter     Social History   Socioeconomic History   Marital status: Married    Spouse name: Clydie Braun   Number of children: 2   Years of education: Not on file   Highest education level: Associate degree: occupational, Scientist, product/process development, or vocational program  Occupational History   Occupation: Retired  Tobacco Use   Smoking status: Former    Types: Cigarettes    Quit date: 05/05/2002    Years since quitting: 20.6    Passive exposure: Never   Smokeless tobacco: Never  Vaping Use   Vaping Use: Never used  Substance and Sexual Activity   Alcohol use: Yes    Alcohol/week: 0.0 standard drinks of alcohol    Comment: beer every now and then   Drug use: No   Sexual activity: Yes  Other Topics Concern   Not on file  Social History Narrative   No Regular exercise   Social Determinants of Health   Financial Resource Strain: Low Risk  (11/20/2022)   Overall Financial Resource Strain (CARDIA)    Difficulty of  Paying Living Expenses: Not very hard  Food Insecurity: No Food Insecurity (11/20/2022)   Hunger Vital Sign    Worried About Running Out of Food in the Last Year: Never true    Ran Out of Food in the Last Year: Never true  Transportation Needs: No Transportation Needs (11/20/2022)   PRAPARE - Administrator, Civil Service (Medical): No    Lack of Transportation (Non-Medical): No  Physical Activity: Insufficiently Active (11/20/2022)   Exercise Vital Sign    Days of Exercise per Week: 5 days    Minutes of Exercise per Session: 10 min  Stress: No Stress Concern Present (11/20/2022)   [employer] of Occupational Health - Occupational Stress Questionnaire    Feeling of Stress : Only a little  Social Connections: Socially Integrated (11/20/2022)   Social Connection and Isolation Panel [NHANES]    Frequency of Communication with Friends and Family: More than three times a week    Frequency of Social Gatherings with Friends and Family: Once a week    Attends Religious Services: More than 4 times per year    Active Member of [employer] or Organizations: Yes    Attends Engineer, structural: More than 4 times per year    Marital Status: Married  Catering manager Violence: Not At Risk (11/02/2022)   Humiliation, Afraid, Rape, and Kick questionnaire    Fear of Current or Ex-Partner: No    Emotionally Abused: No    Physically Abused: No    Sexually Abused: No    No Known Allergies  Current Outpatient Medications  Medication Sig Dispense Refill   aspirin 81 MG chewable tablet Chew 81 mg by mouth daily.     b complex vitamins capsule Take 1 capsule by mouth daily.     carvedilol (COREG) 6.25 MG tablet Take 1 tablet (6.25 mg total) by mouth 2 (two) times daily with a meal. 60 tablet 2   Cholecalciferol (VITAMIN D3) 50 MCG (2000 UT) TABS Take 2,000 Units by mouth daily.     clopidogrel (PLAVIX) 75 MG tablet Take 1 tablet (75 mg total) by mouth daily. Restart on Sunday 11/07/22 30  tablet 6   diclofenac Sodium (VOLTAREN) 1 % GEL Apply 2 g topically daily as needed (pain).     diphenhydrAMINE (BENADRYL ALLERGY) 25 MG tablet Take 25 mg by mouth as needed.     empagliflozin (JARDIANCE) 10  MG TABS tablet Take 1 tablet (10 mg total) by mouth daily. 30 tablet 3   isosorbide dinitrate (ISORDIL) 10 MG tablet Take 10 mg by mouth 3 (three) times daily.     loratadine (CLARITIN) 10 MG tablet Take 10 mg by mouth daily.     magnesium oxide (MAG-OX) 400 (240 Mg) MG tablet Take 400 mg by mouth at bedtime.     pantoprazole (PROTONIX) 40 MG tablet TAKE 1 TABLET (40 MG TOTAL) BY MOUTH DAILY. 90 tablet 0   rosuvastatin (CRESTOR) 20 MG tablet Take 10 mg by mouth daily.     sacubitril-valsartan (ENTRESTO) 24-26 MG Take 1 tablet by mouth 2 (two) times daily. 60 tablet 3   Semaglutide,0.25 or 0.5MG /DOS, (OZEMPIC, 0.25 OR 0.5 MG/DOSE,) 2 MG/1.5ML SOPN Inject 0.5 mg into the skin once a week. If unavailable, please recommend alternative. 1.5 mL 3   spironolactone (ALDACTONE) 25 MG tablet Take 25 mg by mouth daily.      No current facility-administered medications for this visit.    PHYSICAL EXAM There were no vitals filed for this visit.   Well-appearing gentleman in no acute distress Regular rate and rhythm Unlabored breathing No focal neurologic signs or symptoms. Neck incision well-healed  PERTINENT LABORATORY AND RADIOLOGIC DATA  Most recent CBC    Latest Ref Rng & Units 11/23/2022    3:45 PM 11/18/2022    1:49 AM 11/17/2022    9:30 AM  CBC  WBC 4.0 - 10.5 K/uL 7.0  11.6  7.1   Hemoglobin 13.0 - 17.0 g/dL 16.1  09.6  04.5   Hematocrit 39.0 - 52.0 % 44.3  44.3  45.9   Platelets 150.0 - 400.0 K/uL 185.0  201  178      Most recent CMP    Latest Ref Rng & Units 12/08/2022    3:18 PM 11/23/2022    3:45 PM 11/18/2022    1:49 AM  CMP  Glucose 70 - 99 mg/dL 409  811  914   BUN 8 - 23 mg/dL 13  13  17    Creatinine 0.61 - 1.24 mg/dL 7.82  9.56  2.13   Sodium 135 - 145 mmol/L 136   137  133   Potassium 3.5 - 5.1 mmol/L 4.1  4.0  4.0   Chloride 98 - 111 mmol/L 100  101  100   CO2 22 - 32 mmol/L 26  27  18    Calcium 8.9 - 10.3 mg/dL 8.9  9.1  9.2   Total Protein 6.0 - 8.3 g/dL  7.3    Total Bilirubin 0.2 - 1.2 mg/dL  0.9    Alkaline Phos 39 - 117 U/L  68    AST 0 - 37 U/L  17    ALT 0 - 53 U/L  23      Renal function Estimated Creatinine Clearance: 85.4 mL/min (by C-G formula based on SCr of 1.01 mg/dL).  Hgb A1c MFr Bld (%)  Date Value  10/28/2022 6.5 (H)    LDL Cholesterol  Date Value Ref Range Status  12/08/2022 68 0 - 99 mg/dL Final    Comment:           Total Cholesterol/HDL:CHD Risk Coronary Heart Disease Risk Table                     Men   Women  1/2 Average Risk   3.4   3.3  Average Risk       5.0  4.4  2 X Average Risk   9.6   7.1  3 X Average Risk  23.4   11.0        Use the calculated Patient Ratio above and the CHD Risk Table to determine the patient's CHD Risk.        ATP III CLASSIFICATION (LDL):  <100     mg/dL   Optimal  161-096  mg/dL   Near or Above                    Optimal  130-159  mg/dL   Borderline  045-409  mg/dL   High  >811     mg/dL   Very High Performed at [facility], [address] [place], [place] [address]    Direct LDL  Date Value Ref Range Status  02/08/2013 198.1 mg/dL Final    Comment:    Optimal:  <100 mg/dLNear or Above Optimal:  100-129 mg/dLBorderline High:  130-159 mg/dLHigh:  160-189 mg/dLVery High:  >190 mg/dL    [facility] workup reviewed in detail. Right carotid artery stenosis demonstrated on duplex and CT angiogram.  Images not available for me to review.  Carotid duplex Right         Patent stent without evidence of hemodynamically significant  Carotid:      stenosis.   Left Carotid: Known CCA occlusion. Retrograde left external carotid artery                feeding the internal carotid artery.   Vertebrals:  Bilateral vertebral arteries demonstrate antegrade flow.  Subclavians:  Normal flow hemodynamics were seen in bilateral subclavian               arteries.   There is a difference between brachial artery pressures, suggestive  of possible right proximal obstruction.   *See table(s) above for measurements and observations.   Rande Brunt. Lenell Antu, MD FACS [facility] Phone Number: [phone] 12/13/2022 12:12 PM   Total time spent on preparing this encounter including chart reestablished patient, 40 minutes.view, data review, collecting history, examining the patient, coordinating care for this established patient, 40 minutes.  Portions of this report may have been transcribed using voice recognition software.  Every effort has been made to ensure accuracy; however, inadvertent computerized transcription errors may still be present.

## 2022-12-14 ENCOUNTER — Ambulatory Visit (INDEPENDENT_AMBULATORY_CARE_PROVIDER_SITE_OTHER): Payer: No Typology Code available for payment source | Admitting: Vascular Surgery

## 2022-12-14 ENCOUNTER — Encounter: Payer: Self-pay | Admitting: Vascular Surgery

## 2022-12-14 ENCOUNTER — Ambulatory Visit (HOSPITAL_COMMUNITY)
Admission: RE | Admit: 2022-12-14 | Discharge: 2022-12-14 | Disposition: A | Discharge status: Home / Self Care | Payer: No Typology Code available for payment source | Source: Ambulatory Visit | Attending: Vascular Surgery | Admitting: Vascular Surgery

## 2022-12-14 VITALS — BP 123/69 | HR 65 | Temp 98.0°F | Resp 20 | Ht 72.0 in | Wt 216.0 lb

## 2022-12-14 DIAGNOSIS — Z95828 Presence of other vascular implants and grafts: Secondary | ICD-10-CM

## 2022-12-14 DIAGNOSIS — I6521 Occlusion and stenosis of right carotid artery: Secondary | ICD-10-CM | POA: Insufficient documentation

## 2022-12-27 ENCOUNTER — Other Ambulatory Visit: Payer: Self-pay

## 2022-12-27 ENCOUNTER — Other Ambulatory Visit (HOSPITAL_COMMUNITY): Payer: Self-pay

## 2022-12-27 ENCOUNTER — Encounter (HOSPITAL_COMMUNITY): Payer: Self-pay

## 2022-12-27 DIAGNOSIS — I6521 Occlusion and stenosis of right carotid artery: Secondary | ICD-10-CM

## 2022-12-27 DIAGNOSIS — I739 Peripheral vascular disease, unspecified: Secondary | ICD-10-CM

## 2023-01-03 ENCOUNTER — Other Ambulatory Visit: Payer: Self-pay | Admitting: Internal Medicine

## 2023-01-05 ENCOUNTER — Encounter (HOSPITAL_COMMUNITY): Payer: Self-pay | Admitting: Cardiology

## 2023-01-05 ENCOUNTER — Ambulatory Visit (HOSPITAL_COMMUNITY)
Admission: RE | Admit: 2023-01-05 | Discharge: 2023-01-05 | Disposition: A | Discharge status: Home / Self Care | Payer: No Typology Code available for payment source | Source: Ambulatory Visit | Attending: Cardiology | Admitting: Cardiology

## 2023-01-05 ENCOUNTER — Ambulatory Visit (HOSPITAL_COMMUNITY)
Admission: RE | Admit: 2023-01-05 | Discharge: 2023-01-05 | Disposition: A | Discharge status: Home / Self Care | Payer: No Typology Code available for payment source | Source: Ambulatory Visit | Attending: Internal Medicine | Admitting: Internal Medicine

## 2023-01-05 VITALS — BP 122/64 | HR 62 | Wt 219.8 lb

## 2023-01-05 DIAGNOSIS — Z951 Presence of aortocoronary bypass graft: Secondary | ICD-10-CM | POA: Diagnosis not present

## 2023-01-05 DIAGNOSIS — Z8674 Personal history of sudden cardiac arrest: Secondary | ICD-10-CM | POA: Insufficient documentation

## 2023-01-05 DIAGNOSIS — Z8679 Personal history of other diseases of the circulatory system: Secondary | ICD-10-CM

## 2023-01-05 DIAGNOSIS — I2510 Atherosclerotic heart disease of native coronary artery without angina pectoris: Secondary | ICD-10-CM

## 2023-01-05 DIAGNOSIS — Z8249 Family history of ischemic heart disease and other diseases of the circulatory system: Secondary | ICD-10-CM | POA: Diagnosis not present

## 2023-01-05 DIAGNOSIS — I358 Other nonrheumatic aortic valve disorders: Secondary | ICD-10-CM | POA: Diagnosis not present

## 2023-01-05 DIAGNOSIS — Z823 Family history of stroke: Secondary | ICD-10-CM | POA: Insufficient documentation

## 2023-01-05 DIAGNOSIS — I6521 Occlusion and stenosis of right carotid artery: Secondary | ICD-10-CM | POA: Diagnosis not present

## 2023-01-05 DIAGNOSIS — I739 Peripheral vascular disease, unspecified: Secondary | ICD-10-CM

## 2023-01-05 DIAGNOSIS — Z833 Family history of diabetes mellitus: Secondary | ICD-10-CM | POA: Insufficient documentation

## 2023-01-05 DIAGNOSIS — I110 Hypertensive heart disease with heart failure: Secondary | ICD-10-CM | POA: Insufficient documentation

## 2023-01-05 DIAGNOSIS — E785 Hyperlipidemia, unspecified: Secondary | ICD-10-CM | POA: Insufficient documentation

## 2023-01-05 DIAGNOSIS — Z95810 Presence of automatic (implantable) cardiac defibrillator: Secondary | ICD-10-CM | POA: Insufficient documentation

## 2023-01-05 DIAGNOSIS — Z87891 Personal history of nicotine dependence: Secondary | ICD-10-CM | POA: Insufficient documentation

## 2023-01-05 DIAGNOSIS — Z8673 Personal history of transient ischemic attack (TIA), and cerebral infarction without residual deficits: Secondary | ICD-10-CM | POA: Insufficient documentation

## 2023-01-05 DIAGNOSIS — I5022 Chronic systolic (congestive) heart failure: Secondary | ICD-10-CM

## 2023-01-05 DIAGNOSIS — E1151 Type 2 diabetes mellitus with diabetic peripheral angiopathy without gangrene: Secondary | ICD-10-CM | POA: Insufficient documentation

## 2023-01-05 LAB — BRAIN NATRIURETIC PEPTIDE: B Natriuretic Peptide: 32.8 pg/mL (ref 0.0–100.0)

## 2023-01-05 LAB — BASIC METABOLIC PANEL
Anion gap: 9 (ref 5–15)
BUN: 11 mg/dL (ref 8–23)
CO2: 24 mmol/L (ref 22–32)
Calcium: 9.4 mg/dL (ref 8.9–10.3)
Chloride: 101 mmol/L (ref 98–111)
Creatinine, Ser: 1.02 mg/dL (ref 0.61–1.24)
GFR, Estimated: >60 mL/min (ref >60)
Glucose, Bld: 127 mg/dL — ABNORMAL HIGH (ref 70–99)
Potassium: 4.1 mmol/L (ref 3.5–5.1)
Sodium: 134 mmol/L — ABNORMAL LOW (ref 135–145)

## 2023-01-05 MED ORDER — PERFLUTREN LIPID MICROSPHERE
1.0000 mL | INTRAVENOUS | Status: DC | PRN
  Administered 2023-01-05: 6 mL via INTRAVENOUS

## 2023-01-05 MED ORDER — ROSUVASTATIN CALCIUM 10 MG PO TABS: 10.0000 mg | ORAL_TABLET | Freq: Every day | ORAL | 3 refills | Status: DC

## 2023-01-05 MED ORDER — ROSUVASTATIN CALCIUM 20 MG PO TABS: 20.0000 mg | ORAL_TABLET | Freq: Every day | ORAL | 3 refills | Status: AC

## 2023-01-05 MED ORDER — ENTRESTO 49-51 MG PO TABS: 1.0000 | ORAL_TABLET | Freq: Two times a day (BID) | ORAL | 11 refills | Status: DC

## 2023-01-05 NOTE — Patient Instructions (Signed)
Medication Changes:  INCREASE ENTRESTO TO 49/51mg  TWICE DAILY- NEW PRESCRIPTION SENT TO YOUR PHARMACY   STOP: ISOSORBIDE   Lab Work:  Labs done today, your results will be available in MyChart, we will contact you for abnormal readings.  Referrals:  REFERRAL TO CARDIAC REHAB- SOMEONE WILL CALL YOU TO GET YOU SCHEDULED FOR THIS  Follow-Up in: 2 MONTHS AS SCHEDULED   At the [facility], you and your health needs are our priority. We have a designated team specialized in the treatment of Heart Failure. This Care Team includes your primary Heart Failure Specialized Cardiologist (physician), Advanced Practice Providers (APPs- Physician Assistants and Nurse Practitioners), and Pharmacist who all work together to provide you with the care you need, when you need it.   You may see any of the following providers on your designated Care Team at your next follow up:  Dr. Arvilla Meres Dr. Marca Ancona Dr. Marcos Eke, NP Robbie Lis, [place] [facility] [place], [place] Brynda Peon, NP Karle Plumber, PharmD   Please be sure to bring in all your medications bottles to every appointment.   Need to Contact us:  If you have any questions or concerns before your next appointment please send [place] a message through [place] or call our office at [phone].    TO LEAVE A MESSAGE FOR THE NURSE SELECT OPTION 2, PLEASE LEAVE A MESSAGE INCLUDING: YOUR NAME DATE OF BIRTH CALL BACK NUMBER REASON FOR CALL**this is important as we prioritize the call backs  YOU WILL RECEIVE A CALL BACK THE SAME DAY AS LONG AS YOU CALL BEFORE 4:00 PM

## 2023-01-05 NOTE — Progress Notes (Signed)
ADVANCED HEART FAILURE CLINIC NOTE  Referring Physician: Tresa Garter, MD  Primary Care: Tresa Garter, MD Primary Cardiologist:  HPI: Lucas Mcgugan. is a 67 y.o. male with CAD status post CABG x 4 in July 2003, chronic systolic heart failure, carotid artery disease status post right CEA and patch angioplasty in May 2018, PAD status post right iliofemoral endarterectomy and stenting of bilateral common iliac arteries in October 2022, hypertension, hyperlipidemia, type 2 diabetes, history of TIA and VT arrest presenting today to establish care.  He was most recently hospitalized in April 2024 after he suffered VT arrest during sedation/intubation for planned right transcarotid artery revascularization.  Echocardiogram shortly after with LVEF less than 20% and moderately reduced RV function.  He underwent right and left heart catheterization that showed total occlusion of the proximal LAD, CTO of the distal circumflex, subtotal occlusion of the proximal and mid circumflex and CTO of the RCA with patency of the RIMA to LAD and free LIMA to diagonal; occlusion of SVG to OM 2 and SVG to PDA.  Cardiac index at that time of 2.1.  He had a follow-up cardiac MRI with LVEF of 23% and underwent secondary mention ICD.  He was also started on amiodarone for a brief duration.  Interval hx: - Doing very well since our last appointment. Very minimal functional limitations at this time.  - No SOB, LE edema or chest pain.   Activity level/exercise tolerance:  NYHA II Orthopnea:  Sleeps on 2 pillows Paroxysmal noctural dyspnea:  No Chest pain/pressure:  No Orthostatic lightheadedness:  No Palpitations:  No Lower extremity edema:  No Presyncope/syncope:  No Cough:  No  Past Medical History:  Diagnosis Date   AICD (automatic cardioverter/defibrillator) present 11/03/2022   Aortic valve sclerosis    aortic valve sclerosis   Arthritis    CAD (coronary artery disease)    Myoview 5/18:  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 no ischemia EF 27%   Carotid artery disease (HCC)    chronic total occlusion LCCA, retrograde flow from LECA to LICA, ,, 0-39%LICA, s/p Right CEA 2018   CHF (congestive heart failure) (HCC)    Closed head injury with concussion    x2   Complication of anesthesia    Low blood pressure after colonoscopy   Diabetes mellitus without complication (HCC)    Dizziness    Ejection fraction < 50%    EF 40%, echo 2006, no ICD needed  / EF 40%, echo, 11/2009   Former cigarette smoker    Hx of CABG 01/2002   01/2002   Hyperlipidemia    Low HDL   Hypertension    Mitral regurgitation    mild, echo, 11/2009   Myocardial infarction [facility])    "they told me i had a silent heart attack"   Peripheral vascular disease (HCC)    Dr. Georganna Skeans   Sleep disorder    Dr Shelle Iron, lose weight and consider sleep study later   Sleep disorder    TIA (transient ischemic attack)    by history   Volume overload     Current Outpatient Medications  Medication Sig Dispense Refill   aspirin 81 MG chewable tablet Chew 81 mg by mouth daily.     b complex vitamins capsule Take 1 capsule by mouth daily.     carvedilol (COREG) 6.25 MG tablet Take 1 tablet (6.25 mg total) by mouth 2 (two) times daily with a meal. 60 tablet 2  Cholecalciferol (VITAMIN D3) 50 MCG (2000 UT) TABS Take 2,000 Units by mouth daily.     diclofenac Sodium (VOLTAREN) 1 % GEL Apply 2 g topically daily as needed (pain).     diphenhydrAMINE (BENADRYL ALLERGY) 25 MG tablet Take 25 mg by mouth as needed.     empagliflozin (JARDIANCE) 10 MG TABS tablet Take 1 tablet (10 mg total) by mouth daily. 30 tablet 3   isosorbide dinitrate (ISORDIL) 10 MG tablet Take 10 mg by mouth 3 (three) times daily.     magnesium oxide (MAG-OX) 400 (240 Mg) MG tablet Take 400 mg by mouth at bedtime.     pantoprazole (PROTONIX) 40 MG tablet TAKE 1 TABLET (40 MG TOTAL) BY MOUTH DAILY. 90 tablet 0   sacubitril-valsartan  (ENTRESTO) 24-26 MG Take 1 tablet by mouth 2 (two) times daily. 60 tablet 3   Semaglutide,0.25 or 0.5MG /DOS, (OZEMPIC, 0.25 OR 0.5 MG/DOSE,) 2 MG/1.5ML SOPN Inject 0.5 mg into the skin once a week. If unavailable, please recommend alternative. 1.5 mL 3   spironolactone (ALDACTONE) 25 MG tablet Take 25 mg by mouth daily.      loratadine (CLARITIN) 10 MG tablet Take 10 mg by mouth daily. (Patient not taking: Reported on 01/05/2023)     rosuvastatin (CRESTOR) 20 MG tablet Take 10 mg by mouth daily.     No current facility-administered medications for this encounter.   Facility-Administered Medications Ordered in Other Encounters  Medication Dose Route Frequency Provider Last Rate Last Admin   perflutren lipid microspheres (DEFINITY) IV suspension  1-10 mL Intravenous PRN Wyllow Seigler, DO   6 mL at 01/05/23 1343    No Known Allergies    Social History   Socioeconomic History   Marital status: Married    Spouse name: [name]   Number of children: 2   Years of education: Not on file   Highest education level: Associate degree: occupational, Scientist, product/process development, or vocational program  Occupational History   Occupation: Retired  Tobacco Use   Smoking status: Former    Types: Cigarettes    Quit date: 05/05/2002    Years since quitting: 20.6    Passive exposure: Never   Smokeless tobacco: Never  Vaping Use   Vaping Use: Never used  Substance and Sexual Activity   Alcohol use: Yes    Alcohol/week: 0.0 standard drinks of alcohol    Comment: beer every now and then   Drug use: No   Sexual activity: Yes  Other Topics Concern   Not on file  Social History Narrative   No Regular exercise   Social Determinants of Health   Financial Resource Strain: Low Risk  (11/20/2022)   Overall Financial Resource Strain (CARDIA)    Difficulty of Paying Living Expenses: Not very hard  Food Insecurity: No Food Insecurity (11/20/2022)   Hunger Vital Sign    Worried About Running Out of Food in the Last Year:  Never true    Ran Out of Food in the Last Year: Never true  Transportation Needs: No Transportation Needs (11/20/2022)   PRAPARE - Administrator, Civil Service (Medical): No    Lack of Transportation (Non-Medical): No  Physical Activity: Insufficiently Active (11/20/2022)   Exercise Vital Sign    Days of Exercise per Week: 5 days    Minutes of Exercise per Session: 10 min  Stress: No Stress Concern Present (11/20/2022)   [employer] of Occupational Health - Occupational Stress Questionnaire    Feeling of Stress : Only a  little  Social Connections: Socially Integrated (11/20/2022)   Social Connection and Isolation Panel [NHANES]    Frequency of Communication with Friends and Family: More than three times a week    Frequency of Social Gatherings with Friends and Family: Once a week    Attends Religious Services: More than 4 times per year    Active Member of [employer] or Organizations: Yes    Attends Engineer, structural: More than 4 times per year    Marital Status: Married  Catering manager Violence: Not At Risk (11/02/2022)   Humiliation, Afraid, Rape, and Kick questionnaire    Fear of Current or Ex-Partner: No    Emotionally Abused: No    Physically Abused: No    Sexually Abused: No      Family History  Problem Relation Age of Onset   Stroke Mother    Diabetes Father    COPD Father    Arthritis Father    Heart disease Father    Coronary artery disease Other        Male 1st degree relative <50   Diabetes Other        1st degree relative   Stroke Other        Male 1st degree relative<50   Deep vein thrombosis Daughter    Pulmonary fibrosis Daughter     PHYSICAL EXAM: Vitals:   01/05/23 1351  BP: 122/64  Pulse: 62  SpO2: 97%   GENERAL: Well nourished, well developed, and in no apparent distress at rest.  HEENT: Negative for arcus senilis or xanthelasma. There is no scleral icterus.  The mucous membranes are pink and moist.   NECK: Supple, No  masses. Normal carotid upstrokes without bruits. No masses or thyromegaly.    CHEST: There are no chest wall deformities. There is no chest wall tenderness. Respirations are unlabored.  Lungs- CTA B/L CARDIAC:  JVP: 7 cm          Normal rate with regular rhythm. No murmurs, rubs or gallops.  Pulses are 2+ and symmetrical in upper and lower extremities. No edema.  ABDOMEN: Soft, non-tender, non-distended. There are no masses or hepatomegaly. There are normal bowel sounds.  EXTREMITIES: Warm and well perfused with no cyanosis, clubbing.  LYMPHATIC: No axillary or supraclavicular lymphadenopathy.  NEUROLOGIC: Patient is oriented x3 with no focal or lateralizing neurologic deficits.  PSYCH: Patients affect is appropriate, there is no evidence of anxiety or depression.  SKIN: Warm and dry; no lesions or wounds.    DATA REVIEW  ECG: 12/08/22: NSR  As per my personal interpretation  ECHO: TTE: Post arrest (4/24): EF < 20%, RV moderately down TTE: (2015): 30-35%  CATH: R/LHC (4/24): severe native vessel coronary artery disease with total occlusion of the proximal LAD, subtotal occlusion of the proximal and mid circumflex, total occlusion of the distal circumflex, and total occlusion of the midportion of the nondominant RCA; s/p aortocoronary bypass surgery with continued patency of the RIMA to LAD and free LIMA to diagonal, total occlusion of the saphenous vein graft to OM 2 and saphenous vein graft to left PDA.  CO/CI 4.75/2.12, mild pulm htn with mean PA 27, PVR 2.5 WU. mRA 9, mPCWP 15   CMR (4/24):  LVEF 23%, RVEF 54%, LGE consistent with infarct in basal to mid inferior wall and basal to apical lateral wall.   ASSESSMENT & PLAN:  Heart failure with reduced ejection fraction Etiology of HF: Ischemic cardiomyopathy, left heart cath from 4/24 noted above with  severe multivessel native coronary artery disease and now occlusion of the saphenous vein grafts.  Cardiac MRI with a EF of 23% and  severe LGE in the basal to mid inferior wall NYHA class / AHA Stage:II Volume status & Diuretics: Euvolemic; taking lasix PRN.  Vasodilators: Entresto 24/26 mg twice daily currently; D/C ISDN, increase Entresto to 49/51mg  BID. Repeat labs.  Beta-Blocker: Coreg 6.25 mg twice daily, limited by bradycardia. MRA: Spironolactone 25 mg daily Cardiometabolic: Jardiance 10 mg Devices therapies & Valvulopathies: Secondary prevention ICD placed in April 2024 Advanced therapies: His severe LE PAD may limit options. Cardiac index of 2.1 in 4/24; Repeat TTE today (01/05/23) w/ LVEF of 30%. Improved from prior but I still suspect he may require VAD evaluation in the future. Will plan for CPX in 3-57m.   2.  Coronary artery disease status post CABG - s/p CABG x4 (LIMA as a free graft to the diagonal, RIMA to LAD, SVG to OM 2, and SVG to PDA) in 01/2002 with Dr. Dorris Fetch  - [facility] 4/24 showed severe native vessel coronary artery disease with total occlusion of the proximal LAD and subtotal occlusion of the proximal and mid circumflex, total occlusion of the distal circumflex, and total occlusion of the midportion of the nondominant RCA, continued patency of the RIMA to LAD and free LIMA to diagonal.  Total occlusion of the saphenous vein graft to OM 2 and saphenous vein graft to left PDA. No targets for intervention  - stable w/o CP ; D/C ISDN - continue medical management  3. VT Arrest - followed by Dr. Elberta Fortis; likely due to underlying ischemic heart disease with occluded SVG during intubation/sedation.   4. 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 Aborted attempt at rt trans carotid revascularization 10/29/22 per above  - right TCAR 11/18/22 by  Dr. Lenell Antu - on ASA, statin  4. T2DM - A1C of 6.5 - Jardiance 10 - Ozempic  5. HLD - Repeat lipid panel with LDL of 68. Will continue to monitor; if it increases plan to start [id number]  Rondle Lohse Advanced Heart Failure Mechanical Circulatory Support

## 2023-01-06 LAB — ECHOCARDIOGRAM COMPLETE
Area-P 1/2: 2.6 cm2
Calc EF: 44.7 %
S' Lateral: 5.2 cm
Single Plane A2C EF: 46.6 %
Single Plane A4C EF: 37.6 %

## 2023-01-07 ENCOUNTER — Encounter (HOSPITAL_COMMUNITY): Payer: Self-pay | Admitting: Cardiology

## 2023-01-07 MED ORDER — ENTRESTO 49-51 MG PO TABS: 1.0000 | ORAL_TABLET | Freq: Two times a day (BID) | ORAL | 11 refills | Status: AC

## 2023-01-11 ENCOUNTER — Encounter (HOSPITAL_COMMUNITY): Payer: Self-pay | Admitting: Cardiology

## 2023-01-13 ENCOUNTER — Ambulatory Visit: Payer: Self-pay

## 2023-01-13 NOTE — Patient Instructions (Signed)
Visit Information  Thank you for taking time to visit with me today. Please don't hesitate to contact me if I can be of assistance to you.   Following are the goals we discussed today:  Contact Cardiologist if you do not receive a call to schedule cardiac rehab by next week. Contact PCP office to schedule appointment Contact RNCM if care coordination needs in the future   If you are experiencing a Mental Health or Behavioral Health Crisis or need someone to talk to, please call the Suicide and Crisis Lifeline: [address]  Kathyrn Sheriff, RN, MSN, BSN, CCM [facility] Care Coordinator [phone]

## 2023-01-13 NOTE — Patient Outreach (Signed)
  Care Coordination   Follow Up Visit Note   01/13/2023 Name: [name]. MRN: [id number] DOB: Nov 06, 1955  [name]. is a 67 y.o. year old male who sees Plotnikov, Georgina Quint, MD for primary care. I spoke with  [name]. by phone today.  What matters to the patients health and wellness today?  [name] reports he is doing well. He states he is waiting for a call to set up cardiac rehab. He is without questions or concerns today and denies any additional care coordination needs. Last office visit with PCP 09/29/21. RNCM encouraged patient to contact provider office to schedule appointment. Patient to contact RNCM if care coordination needs in the future.  Goals Addressed             This Visit's Progress    COMPLETED: assist with health management-continue to improve post hospitalization       Interventions Today    Flowsheet Row Most Recent Value  General Interventions   General Interventions Discussed/Reviewed General Interventions Reviewed  [facility] encouraged patient to contact RNCM as needed with care coordination needs]  Education Interventions   Education Provided Provided Education  [LOV 09/29/21-advised to schedule with PCP for follow up]  Provided Verbal Education On Other  [advised to contact cardiologist if he does not hear anything from cardiac rehab within the week.]            SDOH assessments and interventions completed:  No  Care Coordination Interventions:  Yes, provided   Follow up plan: No further intervention required.   Encounter Outcome:  Pt. Visit Completed   Kathyrn Sheriff, RN, MSN, BSN, CCM [facility] Care Coordinator [phone]

## 2023-01-17 ENCOUNTER — Encounter (HOSPITAL_COMMUNITY): Payer: Self-pay

## 2023-01-18 ENCOUNTER — Telehealth (HOSPITAL_COMMUNITY): Payer: Self-pay | Admitting: *Deleted

## 2023-01-18 NOTE — Telephone Encounter (Signed)
Spoke with Bud. Confirmed appointment. Completed health history.Thayer Headings RN BSN

## 2023-01-19 ENCOUNTER — Encounter (HOSPITAL_COMMUNITY)
Admission: RE | Admit: 2023-01-19 | Discharge: 2023-01-19 | Disposition: A | Discharge status: Home / Self Care | Payer: Medicare Other | Source: Ambulatory Visit | Attending: Cardiology | Admitting: Cardiology

## 2023-01-19 VITALS — BP 102/62 | HR 64 | Ht 72.0 in | Wt 223.1 lb

## 2023-01-19 DIAGNOSIS — I5022 Chronic systolic (congestive) heart failure: Secondary | ICD-10-CM | POA: Insufficient documentation

## 2023-01-19 NOTE — Progress Notes (Signed)
Cardiac Rehab Medication Review   Does the patient  feel that his/her medications are working for him/her?  YES   Has the patient been experiencing any side effects to the medications prescribed? YES  Does the patient measure his/her own blood pressure or blood glucose at home?  YES    Does the patient have any problems obtaining medications due to transportation or finances?   NO  Understanding of regimen: excellent Understanding of indications: excellent Potential of compliance: excellent    Comments: Bud has a great understanding of his medications and regime. He checks his BP occasionally and his CBG daily.    Jonna Coup, MS, ACSM-CEP 01/19/2023 1:17 PM

## 2023-01-19 NOTE — Progress Notes (Signed)
Cardiac Individual Treatment Plan  Patient Details  Name: [name]. MRN: [id number] Date of Birth: [birth year] Referring Provider:   Flowsheet Row INTENSIVE CARDIAC REHAB ORIENT from 01/19/2023 in [facility]  Referring Provider Dorthula Nettles, MD       Initial Encounter Date:  Flowsheet Row INTENSIVE CARDIAC REHAB ORIENT from 01/19/2023 in [facility]  Date 01/19/23       Visit Diagnosis: Heart failure, chronic systolic (HCC)  Patient's Home Medications on Admission:  Current Outpatient Medications:    aspirin 81 MG chewable tablet, Chew 81 mg by mouth daily., Disp: , Rfl:    b complex vitamins capsule, Take 1 capsule by mouth daily., Disp: , Rfl:    carvedilol (COREG) 6.25 MG tablet, Take 1 tablet (6.25 mg total) by mouth 2 (two) times daily with a meal., Disp: 60 tablet, Rfl: 2   Cholecalciferol (VITAMIN D3) 50 MCG (2000 UT) TABS, Take 2,000 Units by mouth daily., Disp: , Rfl:    diclofenac Sodium (VOLTAREN) 1 % GEL, Apply 2 g topically daily as needed (pain)., Disp: , Rfl:    diphenhydrAMINE (BENADRYL ALLERGY) 25 MG tablet, Take 25 mg by mouth as needed., Disp: , Rfl:    empagliflozin (JARDIANCE) 10 MG TABS tablet, Take 1 tablet (10 mg total) by mouth daily., Disp: 30 tablet, Rfl: 3   magnesium oxide (MAG-OX) 400 (240 Mg) MG tablet, Take 400 mg by mouth at bedtime., Disp: , Rfl:    pantoprazole (PROTONIX) 40 MG tablet, TAKE 1 TABLET (40 MG TOTAL) BY MOUTH DAILY., Disp: 90 tablet, Rfl: 0   rosuvastatin (CRESTOR) 20 MG tablet, Take 1 tablet (20 mg total) by mouth daily., Disp: 90 tablet, Rfl: 3   sacubitril-valsartan (ENTRESTO) 49-51 MG, Take 1 tablet by mouth 2 (two) times daily., Disp: 60 tablet, Rfl: 11   Semaglutide,0.25 or 0.5MG /DOS, (OZEMPIC, 0.25 OR 0.5 MG/DOSE,) 2 MG/1.5ML SOPN, Inject 0.5 mg into the skin once a week. If unavailable, please  recommend alternative., Disp: 1.5 mL, Rfl: 3   spironolactone (ALDACTONE) 25 MG tablet, Take 25 mg by mouth daily. , Disp: , Rfl:    loratadine (CLARITIN) 10 MG tablet, Take 10 mg by mouth daily. 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 LICA, ,, 0-39%LICA, s/p Right CEA 2018   CHF (congestive heart failure) (HCC)    Closed head injury with concussion    x2   Complication of anesthesia    Low blood pressure after colonoscopy   Diabetes mellitus without complication (HCC)    Dizziness    Ejection fraction < 50%    EF 40%, echo 2006, no ICD needed  / EF 40%, echo, 11/2009   Former cigarette smoker    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 Georganna Skeans   Sleep disorder    Dr Shelle Iron, lose weight and consider sleep study later   Sleep disorder    TIA (transient ischemic attack)    by history   Volume overload     Tobacco Use: Social History   Tobacco Use  Smoking Status Former   Current packs/day: 0.00   Types: Cigarettes   Quit date: 05/05/2002   Years since quitting: 20.7   Passive exposure: Never  Smokeless Tobacco Never    Labs: Review Flowsheet  More data exists      Latest Ref Rng & Units 07/23/2021 09/29/2021 10/28/2022 10/29/2022 12/08/2022  Labs for ITP Cardiac and Pulmonary Rehab  Cholestrol 0 - 200 mg/dL - - - - 376   LDL (calc) 0 - 99 mg/dL - - - - 68    HDL-C >28 mg/dL - - - - 34   Trlycerides <150 mg/dL - - - - 315   Hemoglobin A1c 4.8 - 5.6 % 6.3  7.2  6.5  - -  PH, Arterial 7.35 - 7.45 - - 7.344  7.296  7.287  7.316  -  PCO2 arterial 32 - 48 mmHg - - 34.0  41.2  41.5  40.1  -  Bicarbonate 20.0 - 28.0 mmol/L - - 18.7  20.1  19.9  24.4  20.5  -  TCO2 22 - 32 mmol/L - - 20  21  21  26  22   -  Acid-base deficit 0.0 - 2.0 mmol/L - - 6.0  6.0  7.0  2.0  5.0  -  O2 Saturation % - - 96  100  88  59  91  -    Details       Multiple values from one day are sorted in reverse-chronological order         Capillary Blood Glucose: Lab Results  Component Value Date   GLUCAP 180 (H) 11/18/2022   GLUCAP 145 (H) 11/17/2022   GLUCAP 138 (H) 11/17/2022   GLUCAP 172 (H) 11/17/2022   GLUCAP 189 (H) 11/03/2022     Exercise Target Goals: Exercise Program Goal: Individual exercise prescription set using results from initial 6 min walk test and THRR while considering  patient's activity barriers and safety.   Exercise Prescription Goal: Initial exercise prescription builds to 30-45 minutes a day of aerobic activity, 2-3 days per week.  Home exercise guidelines will be given to patient during program as part of exercise prescription that the participant will acknowledge.  Activity Barriers & Risk Stratification:  Activity Barriers & Cardiac Risk Stratification - 01/19/23 1308       Activity Barriers & Cardiac Risk Stratification   Activity Barriers Decreased Ventricular Function;Deconditioning;Joint Problems    Cardiac Risk Stratification High   <5 METs on            6 Minute Walk:  6 Minute Walk     Row Name 01/19/23 1307         6 Minute Walk   Phase Initial     Distance 1440 feet     Walk Time 6 minutes     # of Rest Breaks 0     MPH 2.73     METS 3.01     RPE 9     Perceived Dyspnea  0     VO2 Peak 10.55     Symptoms No  Resting HR 64 bpm     Resting BP 102/62     Resting Oxygen Saturation  97 %      Exercise Oxygen Saturation  during 6 min walk 98 %     Max Ex. HR 91 bpm     Max Ex. BP 124/70     2 Minute Post BP 110/66              Oxygen Initial Assessment:   Oxygen Re-Evaluation:   Oxygen Discharge (Final Oxygen Re-Evaluation):   Initial Exercise Prescription:  Initial Exercise Prescription - 01/19/23 1300       Date of Initial Exercise RX and Referring Provider   Date 01/19/23    Referring Provider Dorthula Nettles, MD    Expected Discharge Date 04/13/23      Recumbant Bike   Level 2    RPM 60    Watts 30    Minutes 15    METs 2.7      Arm Ergometer   Level 1    Watts 20    RPM 50    Minutes 15    METs 2.5      Prescription Details   Frequency (times per week) 3    Duration Progress to 30 minutes of continuous aerobic without signs/symptoms of physical distress      Intensity   THRR 40-80% of Max Heartrate 61-121    Ratings of Perceived Exertion 11-13    Perceived Dyspnea 0-4      Progression   Progression Continue progressive overload as per policy without signs/symptoms or physical distress.      Resistance Training   Training Prescription Yes    Weight 3    Reps 10-15             Perform Capillary Blood Glucose checks as needed.  Exercise Prescription Changes:   Exercise Comments:   Exercise Goals and Review:   Exercise Goals     Row Name 01/19/23 1012             Exercise Goals   Increase Physical Activity Yes       Intervention Provide advice, education, support and counseling about physical activity/exercise needs.;Develop an individualized exercise prescription for aerobic and resistive training based on initial evaluation findings, risk stratification, comorbidities and participant's personal goals.       Expected Outcomes Short Term: Attend rehab on a regular basis to increase amount of physical activity.;Long Term: Add in home exercise to make exercise part of routine and to increase amount of physical  activity.;Long Term: Exercising regularly at least 3-5 days a week.       Increase Strength and Stamina Yes       Intervention Provide advice, education, support and counseling about physical activity/exercise needs.;Develop an individualized exercise prescription for aerobic and resistive training based on initial evaluation findings, risk stratification, comorbidities and participant's personal goals.       Expected Outcomes Short Term: Increase workloads from initial exercise prescription for resistance, speed, and METs.;Short Term: Perform resistance training exercises routinely during rehab and add in resistance training at home;Long Term: Improve cardiorespiratory fitness, muscular endurance and strength as measured by increased METs and functional capacity ( )       Able to understand and use rate of perceived exertion (RPE) scale Yes       Intervention Provide education and explanation on how to use RPE scale       Expected Outcomes Short Term: Able to use RPE  daily in rehab to express subjective intensity level;Long Term:  Able to use RPE to guide intensity level when exercising independently       Knowledge and understanding of Target Heart Rate Range (THRR) Yes       Intervention Provide education and explanation of THRR including how the numbers were predicted and where they are located for reference       Expected Outcomes Short Term: Able to state/look up THRR;Short Term: Able to use daily as guideline for intensity in rehab;Long Term: Able to use THRR to govern intensity when exercising independently       Understanding of Exercise Prescription Yes       Intervention Provide education, explanation, and written materials on patient's individual exercise prescription       Expected Outcomes Short Term: Able to explain program exercise prescription;Long Term: Able to explain home exercise prescription to exercise independently                Exercise Goals Re-Evaluation  :   Discharge Exercise Prescription (Final Exercise Prescription Changes):   Nutrition:  Target Goals: Understanding of nutrition guidelines, daily intake of sodium 1500mg , cholesterol 200mg , calories 30% from fat and 7% or less from saturated fats, daily to have 5 or more servings of fruits and vegetables.  Biometrics:  Pre Biometrics - 01/19/23 1306       Pre Biometrics   Waist Circumference 48 inches    Hip Circumference 44 inches    Waist to Hip Ratio 1.09 %    Triceps Skinfold 28 mm    % Body Fat 34.5 %    Grip Strength 34 kg    Flexibility 11.25 in    Single Leg Stand 30 seconds              Nutrition Therapy Plan and Nutrition Goals:   Nutrition Assessments:  MEDIFICTS Score Key: ?70 Need to make dietary changes  40-70 Heart Healthy Diet ? 40 Therapeutic Level Cholesterol Diet    Picture Your Plate Scores: <16 Unhealthy dietary pattern with much room for improvement. 41-50 Dietary pattern unlikely to meet recommendations for good health and room for improvement. 51-60 More healthful dietary pattern, with some room for improvement.  >60 Healthy dietary pattern, although there may be some specific behaviors that could be improved.    Nutrition Goals Re-Evaluation:   Nutrition Goals Re-Evaluation:   Nutrition Goals Discharge (Final Nutrition Goals Re-Evaluation):   Psychosocial: Target Goals: Acknowledge presence or absence of significant depression and/or stress, maximize coping skills, provide positive support system. Participant is able to verbalize types and ability to use techniques and skills needed for reducing stress and depression.  Initial Review & Psychosocial Screening:  Initial Psych Review & Screening - 01/19/23 1311       Initial Review   Current issues with None Identified      Family Dynamics   Good Support System? Yes   Bud has his wife for support     Barriers   Psychosocial barriers to participate in program There are no  identifiable barriers or psychosocial needs.      Screening Interventions   Interventions Encouraged to exercise;Provide feedback about the scores to participant    Expected Outcomes Short Term goal: Identification and review with participant of any Quality of Life or Depression concerns found by scoring the questionnaire.;Long Term goal: The participant improves quality of Life and PHQ9 Scores as seen by post scores and/or verbalization of changes  Quality of Life Scores:  Quality of Life - 01/19/23 1311       Quality of Life   Select Quality of Life      Quality of Life Scores   Health/Function Pre 24.7 %    Socioeconomic Pre 27.69 %    Psych/Spiritual Pre 27.86 %    Family Pre 30 %    GLOBAL Pre 26.77 %            Scores of 19 and below usually indicate a poorer quality of life in these areas.  A difference of  2-3 points is a clinically meaningful difference.  A difference of 2-3 points in the total score of the Quality of Life Index has been associated with significant improvement in overall quality of life, self-image, physical symptoms, and general health in studies assessing change in quality of life.  PHQ-9: Review Flowsheet  More data exists      01/19/2023 11/23/2022 07/06/2022 07/03/2021 03/03/2021  Depression screen PHQ 2/9  Decreased Interest 0 0 0 0 0 0  Down, Depressed, Hopeless 0 0 0 0 0 0  PHQ - 2 Score 0 0 0 0 0 0  Altered sleeping 0 0 - - 0  Tired, decreased energy 0 0 - - 0  Change in appetite 0 0 - - 0  Feeling bad or failure about yourself  0 0 - - 0  Trouble concentrating 0 0 - - 0  Moving slowly or fidgety/restless 0 0 - - 0  Suicidal thoughts 0 0 - - 0  PHQ-9 Score 0 0 - - 0  Difficult doing work/chores - Not difficult at all - - -    Details       Multiple values from one day are sorted in reverse-chronological order        Interpretation of Total Score  Total Score Depression Severity:  1-4 = Minimal depression, 5-9 =  Mild depression, 10-14 = Moderate depression, 15-19 = Moderately severe depression, 20-27 = Severe depression   Psychosocial Evaluation and Intervention:   Psychosocial Re-Evaluation:   Psychosocial Discharge (Final Psychosocial Re-Evaluation):   Vocational Rehabilitation: Provide vocational rehab assistance to qualifying candidates.   Vocational Rehab Evaluation & Intervention:   Education: Education Goals: Education classes will be provided on a weekly basis, covering required topics. Participant will state understanding/return demonstration of topics presented.     Core Videos: Exercise    Move It!  Clinical staff conducted group or individual video education with verbal and written material and guidebook.  Patient learns the recommended Pritikin exercise program. Exercise with the goal of living a long, healthy life. Some of the health benefits of exercise include controlled diabetes, healthier blood pressure levels, improved cholesterol levels, improved heart and lung capacity, improved sleep, and better body composition. Everyone should speak with their doctor before starting or changing an exercise routine.  Biomechanical Limitations Clinical staff conducted group or individual video education with verbal and written material and guidebook.  Patient learns how biomechanical limitations can impact exercise and how we can mitigate and possibly overcome limitations to have an impactful and balanced exercise routine.  Body Composition Clinical staff conducted group or individual video education with verbal and written material and guidebook.  Patient learns that body composition (ratio of muscle mass to fat mass) is a key component to assessing overall fitness, rather than body weight alone. Increased fat mass, especially visceral belly fat, can put [place] at increased risk for metabolic syndrome, type 2 diabetes, heart  disease, and even death. It is recommended to combine diet and  exercise (cardiovascular and resistance training) to improve your body composition. Seek guidance from your physician and exercise physiologist before implementing an exercise routine.  Exercise Action Plan Clinical staff conducted group or individual video education with verbal and written material and guidebook.  Patient learns the recommended strategies to achieve and enjoy long-term exercise adherence, including variety, self-motivation, self-efficacy, and positive decision making. Benefits of exercise include fitness, good health, weight management, more energy, better sleep, less stress, and overall well-being.  Medical   Heart Disease Risk Reduction Clinical staff conducted group or individual video education with verbal and written material and guidebook.  Patient learns our heart is our most vital organ as it circulates oxygen, nutrients, white blood cells, and hormones throughout the entire body, and carries waste away. Data supports a plant-based eating plan like the Pritikin Program for its effectiveness in slowing progression of and reversing heart disease. The video provides a number of recommendations to address heart disease.   Metabolic Syndrome and Belly Fat  Clinical staff conducted group or individual video education with verbal and written material and guidebook.  Patient learns what metabolic syndrome is, how it leads to heart disease, and how one can reverse it and keep it from coming back. You have metabolic syndrome if you have 3 of the following 5 criteria: abdominal obesity, high blood pressure, high triglycerides, low HDL cholesterol, and high blood sugar.  Hypertension and Heart Disease Clinical staff conducted group or individual video education with verbal and written material and guidebook.  Patient learns that high blood pressure, or hypertension, is very common in the [place]. Hypertension is largely due to excessive salt intake, but other important risk  factors include being overweight, physical inactivity, drinking too much alcohol, smoking, and not eating enough potassium from fruits and vegetables. High blood pressure is a leading risk factor for heart attack, stroke, congestive heart failure, dementia, kidney failure, and premature death. Long-term effects of excessive salt intake include stiffening of the arteries and thickening of heart muscle and organ damage. Recommendations include ways to reduce hypertension and the risk of heart disease.  Diseases of Our Time - Focusing on Diabetes Clinical staff conducted group or individual video education with verbal and written material and guidebook.  Patient learns why the best way to stop diseases of our time is prevention, through food and other lifestyle changes. Medicine (such as prescription pills and surgeries) is often only a Band-Aid on the problem, not a long-term solution. Most common diseases of our time include obesity, type 2 diabetes, hypertension, heart disease, and cancer. The Pritikin Program is recommended and has been proven to help reduce, reverse, and/or prevent the damaging effects of metabolic syndrome.  Nutrition   Overview of the Pritikin Eating Plan  Clinical staff conducted group or individual video education with verbal and written material and guidebook.  Patient learns about the Pritikin Eating Plan for disease risk reduction. The Pritikin Eating Plan emphasizes a wide variety of unrefined, minimally-processed carbohydrates, like fruits, vegetables, whole grains, and legumes. Go, Caution, and Stop food choices are explained. Plant-based and lean animal proteins are emphasized. Rationale provided for low sodium intake for blood pressure control, low added sugars for blood sugar stabilization, and low added fats and oils for coronary artery disease risk reduction and weight management.  Calorie Density  Clinical staff conducted group or individual video education with verbal  and written material and guidebook.  Patient learns  about calorie density and how it impacts the Pritikin Eating Plan. Knowing the characteristics of the food you choose will help you decide whether those foods will lead to weight gain or weight loss, and whether you want to consume more or less of them. Weight loss is usually a side effect of the Pritikin Eating Plan because of its focus on low calorie-dense foods.  Label Reading  Clinical staff conducted group or individual video education with verbal and written material and guidebook.  Patient learns about the Pritikin recommended label reading guidelines and corresponding recommendations regarding calorie density, added sugars, sodium content, and whole grains.  Dining Out - Part 1  Clinical staff conducted group or individual video education with verbal and written material and guidebook.  Patient learns that restaurant meals can be sabotaging because they can be so high in calories, fat, sodium, and/or sugar. Patient learns recommended strategies on how to positively address this and avoid unhealthy pitfalls.  Facts on Fats  Clinical staff conducted group or individual video education with verbal and written material and guidebook.  Patient learns that lifestyle modifications can be just as effective, if not more so, as many medications for lowering your risk of heart disease. A Pritikin lifestyle can help to reduce your risk of inflammation and atherosclerosis (cholesterol build-up, or plaque, in the artery walls). Lifestyle interventions such as dietary choices and physical activity address the cause of atherosclerosis. A review of the types of fats and their impact on blood cholesterol levels, along with dietary recommendations to reduce fat intake is also included.  Nutrition Action Plan  Clinical staff conducted group or individual video education with verbal and written material and guidebook.  Patient learns how to incorporate Pritikin  recommendations into their lifestyle. Recommendations include planning and keeping personal health goals in mind as an important part of their success.  Healthy Mind-Set    Healthy Minds, Bodies, Hearts  Clinical staff conducted group or individual video education with verbal and written material and guidebook.  Patient learns how to identify when they are stressed. Video will discuss the impact of that stress, as well as the many benefits of stress management. Patient will also be introduced to stress management techniques. The way we think, act, and feel has an impact on our hearts.  How Our Thoughts Can Heal Our Hearts  Clinical staff conducted group or individual video education with verbal and written material and guidebook.  Patient learns that negative thoughts can cause depression and anxiety. This can result in negative lifestyle behavior and serious health problems. Cognitive behavioral therapy is an effective method to help control our thoughts in order to change and improve our emotional outlook.  Additional Videos:  Exercise    Improving Performance  Clinical staff conducted group or individual video education with verbal and written material and guidebook.  Patient learns to use a non-linear approach by alternating intensity levels and lengths of time spent exercising to help burn more calories and lose more body fat. Cardiovascular exercise helps improve heart health, metabolism, hormonal balance, blood sugar control, and recovery from fatigue. Resistance training improves strength, endurance, balance, coordination, reaction time, metabolism, and muscle mass. Flexibility exercise improves circulation, posture, and balance. Seek guidance from your physician and exercise physiologist before implementing an exercise routine and learn your capabilities and proper form for all exercise.  Introduction to Yoga  Clinical staff conducted group or individual video education with verbal and  written material and guidebook.  Patient learns about yoga, a  discipline of the coming together of mind, breath, and body. The benefits of yoga include improved flexibility, improved range of motion, better posture and core strength, increased lung function, weight loss, and positive self-image. Yoga's heart health benefits include lowered blood pressure, healthier heart rate, decreased cholesterol and triglyceride levels, improved immune function, and reduced stress. Seek guidance from your physician and exercise physiologist before implementing an exercise routine and learn your capabilities and proper form for all exercise.  Medical   Aging: Enhancing Your Quality of Life  Clinical staff conducted group or individual video education with verbal and written material and guidebook.  Patient learns key strategies and recommendations to stay in good physical health and enhance quality of life, such as prevention strategies, having an advocate, securing a Health Care Proxy and Power of Attorney, and keeping a list of medications and system for tracking them. It also discusses how to avoid risk for bone loss.  Biology of Weight Control  Clinical staff conducted group or individual video education with verbal and written material and guidebook.  Patient learns that weight gain occurs because we consume more calories than we burn (eating more, moving less). Even if your body weight is normal, you may have higher ratios of fat compared to muscle mass. Too much body fat puts you at increased risk for cardiovascular disease, heart attack, stroke, type 2 diabetes, and obesity-related cancers. In addition to exercise, following the Pritikin Eating Plan can help reduce your risk.  Decoding Lab Results  Clinical staff conducted group or individual video education with verbal and written material and guidebook.  Patient learns that lab test reflects one measurement whose values change over time and are influenced  by many factors, including medication, stress, sleep, exercise, food, hydration, pre-existing medical conditions, and more. It is recommended to use the knowledge from this video to become more involved with your lab results and evaluate your numbers to speak with your doctor.   Diseases of Our Time - Overview  Clinical staff conducted group or individual video education with verbal and written material and guidebook.  Patient learns that according to the CDC, 50% to 70% of chronic diseases (such as obesity, type 2 diabetes, elevated lipids, hypertension, and heart disease) are avoidable through lifestyle improvements including healthier food choices, listening to satiety cues, and increased physical activity.  Sleep Disorders Clinical staff conducted group or individual video education with verbal and written material and guidebook.  Patient learns how good quality and duration of sleep are important to overall health and well-being. Patient also learns about sleep disorders and how they impact health along with recommendations to address them, including discussing with a physician.  Nutrition  Dining Out - Part 2 Clinical staff conducted group or individual video education with verbal and written material and guidebook.  Patient learns how to plan ahead and communicate in order to maximize their dining experience in a healthy and nutritious manner. Included are recommended food choices based on the type of restaurant the patient is visiting.   Fueling a Banker conducted group or individual video education with verbal and written material and guidebook.  There is a strong connection between our food choices and our health. Diseases like obesity and type 2 diabetes are very prevalent and are in large-part due to lifestyle choices. The Pritikin Eating Plan provides plenty of food and hunger-curbing satisfaction. It is easy to follow, affordable, and helps reduce health  risks.  Menu Workshop  Clinical staff conducted  group or individual video education with verbal and written material and guidebook.  Patient learns that restaurant meals can sabotage health goals because they are often packed with calories, fat, sodium, and sugar. Recommendations include strategies to plan ahead and to communicate with the manager, chef, or server to help order a healthier meal.  Planning Your Eating Strategy  Clinical staff conducted group or individual video education with verbal and written material and guidebook.  Patient learns about the Pritikin Eating Plan and its benefit of reducing the risk of disease. The Pritikin Eating Plan does not focus on calories. Instead, it emphasizes high-quality, nutrient-rich foods. By knowing the characteristics of the foods, we choose, we can determine their calorie density and make informed decisions.  Targeting Your Nutrition Priorities  Clinical staff conducted group or individual video education with verbal and written material and guidebook.  Patient learns that lifestyle habits have a tremendous impact on disease risk and progression. This video provides eating and physical activity recommendations based on your personal health goals, such as reducing LDL cholesterol, losing weight, preventing or controlling type 2 diabetes, and reducing high blood pressure.  Vitamins and Minerals  Clinical staff conducted group or individual video education with verbal and written material and guidebook.  Patient learns different ways to obtain key vitamins and minerals, including through a recommended healthy diet. It is important to discuss all supplements you take with your doctor.   Healthy Mind-Set    Smoking Cessation  Clinical staff conducted group or individual video education with verbal and written material and guidebook.  Patient learns that cigarette smoking and tobacco addiction pose a serious health risk which affects millions of  people. Stopping smoking will significantly reduce the risk of heart disease, lung disease, and many forms of cancer. Recommended strategies for quitting are covered, including working with your doctor to develop a successful plan.  Culinary   Becoming a Set designer conducted group or individual video education with verbal and written material and guidebook.  Patient learns that cooking at home can be healthy, cost-effective, quick, and puts them in control. Keys to cooking healthy recipes will include looking at your recipe, assessing your equipment needs, planning ahead, making it simple, choosing cost-effective seasonal ingredients, and limiting the use of added fats, salts, and sugars.  Cooking - Breakfast and Snacks  Clinical staff conducted group or individual video education with verbal and written material and guidebook.  Patient learns how important breakfast is to satiety and nutrition through the entire day. Recommendations include key foods to eat during breakfast to help stabilize blood sugar levels and to prevent overeating at meals later in the day. Planning ahead is also a key component.  Cooking - Educational psychologist conducted group or individual video education with verbal and written material and guidebook.  Patient learns eating strategies to improve overall health, including an approach to cook more at home. Recommendations include thinking of animal protein as a side on your plate rather than center stage and focusing instead on lower calorie dense options like vegetables, fruits, whole grains, and plant-based proteins, such as beans. Making sauces in large quantities to freeze for later and leaving the skin on your vegetables are also recommended to maximize your experience.  Cooking - Healthy Salads and Dressing Clinical staff conducted group or individual video education with verbal and written material and guidebook.  Patient learns that  vegetables, fruits, whole grains, and legumes are the foundations of the Pritikin  Eating Plan. Recommendations include how to incorporate each of these in flavorful and healthy salads, and how to create homemade salad dressings. Proper handling of ingredients is also covered. Cooking - Soups and [employer] - Soups and Desserts Clinical staff conducted group or individual video education with verbal and written material and guidebook.  Patient learns that Pritikin soups and desserts make for easy, nutritious, and delicious snacks and meal components that are low in sodium, fat, sugar, and calorie density, while high in vitamins, minerals, and filling fiber. Recommendations include simple and healthy ideas for soups and desserts.   Overview     The Pritikin Solution Program Overview Clinical staff conducted group or individual video education with verbal and written material and guidebook.  Patient learns that the results of the Pritikin Program have been documented in more than 100 articles published in peer-reviewed journals, and the benefits include reducing risk factors for (and, in some cases, even reversing) high cholesterol, high blood pressure, type 2 diabetes, obesity, and more! An overview of the three key pillars of the Pritikin Program will be covered: eating well, doing regular exercise, and having a healthy mind-set.  WORKSHOPS  Exercise: Exercise Basics: Building Your Action Plan Clinical staff led group instruction and group discussion with PowerPoint presentation and patient guidebook. To enhance the learning environment the use of posters, models and videos may be added. At the conclusion of this workshop, patients will comprehend the difference between physical activity and exercise, as well as the benefits of incorporating both, into their routine. Patients will understand the FITT (Frequency, Intensity, Time, and Type) principle and how to use it to build an exercise action  plan. In addition, safety concerns and other considerations for exercise and cardiac rehab will be addressed by the presenter. The purpose of this lesson is to promote a comprehensive and effective weekly exercise routine in order to improve patients' overall level of fitness.   Managing Heart Disease: Your Path to a Healthier Heart Clinical staff led group instruction and group discussion with PowerPoint presentation and patient guidebook. To enhance the learning environment the use of posters, models and videos may be added.At the conclusion of this workshop, patients will understand the anatomy and physiology of the heart. Additionally, they will understand how Pritikin's three pillars impact the risk factors, the progression, and the management of heart disease.  The purpose of this lesson is to provide a high-level overview of the heart, heart disease, and how the Pritikin lifestyle positively impacts risk factors.  Exercise Biomechanics Clinical staff led group instruction and group discussion with PowerPoint presentation and patient guidebook. To enhance the learning environment the use of posters, models and videos may be added. Patients will learn how the structural parts of their bodies function and how these functions impact their daily activities, movement, and exercise. Patients will learn how to promote a neutral spine, learn how to manage pain, and identify ways to improve their physical movement in order to promote healthy living. The purpose of this lesson is to expose patients to common physical limitations that impact physical activity. Participants will learn practical ways to adapt and manage aches and pains, and to minimize their effect on regular exercise. Patients will learn how to maintain good posture while sitting, walking, and lifting.  Balance Training and Fall Prevention  Clinical staff led group instruction and group discussion with PowerPoint presentation and  patient guidebook. To enhance the learning environment the use of posters, models and videos may be added.  At the conclusion of this workshop, patients will understand the importance of their sensorimotor skills (vision, proprioception, and the vestibular system) in maintaining their ability to balance as they age. Patients will apply a variety of balancing exercises that are appropriate for their current level of function. Patients will understand the common causes for poor balance, possible solutions to these problems, and ways to modify their physical environment in order to minimize their fall risk. The purpose of this lesson is to teach patients about the importance of maintaining balance as they age and ways to minimize their risk of falling.  WORKSHOPS   Nutrition:  Fueling a Ship broker led group instruction and group discussion with PowerPoint presentation and patient guidebook. To enhance the learning environment the use of posters, models and videos may be added. Patients will review the foundational principles of the Pritikin Eating Plan and understand what constitutes a serving size in each of the food groups. Patients will also learn Pritikin-friendly foods that are better choices when away from home and review make-ahead meal and snack options. Calorie density will be reviewed and applied to three nutrition priorities: weight maintenance, weight loss, and weight gain. The purpose of this lesson is to reinforce (in a group setting) the key concepts around what patients are recommended to eat and how to apply these guidelines when away from home by planning and selecting Pritikin-friendly options. Patients will understand how calorie density may be adjusted for different weight management goals.  Mindful Eating  Clinical staff led group instruction and group discussion with PowerPoint presentation and patient guidebook. To enhance the learning environment the use of posters,  models and videos may be added. Patients will briefly review the concepts of the Pritikin Eating Plan and the importance of low-calorie dense foods. The concept of mindful eating will be introduced as well as the importance of paying attention to internal hunger signals. Triggers for non-hunger eating and techniques for dealing with triggers will be explored. The purpose of this lesson is to provide patients with the opportunity to review the basic principles of the Pritikin Eating Plan, discuss the value of eating mindfully and how to measure internal cues of hunger and fullness using the Hunger Scale. Patients will also discuss reasons for non-hunger eating and learn strategies to use for controlling emotional eating.  Targeting Your Nutrition Priorities Clinical staff led group instruction and group discussion with PowerPoint presentation and patient guidebook. To enhance the learning environment the use of posters, models and videos may be added. Patients will learn how to determine their genetic susceptibility to disease by reviewing their family history. Patients will gain insight into the importance of diet as part of an overall healthy lifestyle in mitigating the impact of genetics and other environmental insults. The purpose of this lesson is to provide patients with the opportunity to assess their personal nutrition priorities by looking at their family history, their own health history and current risk factors. Patients will also be able to discuss ways of prioritizing and modifying the Pritikin Eating Plan for their highest risk areas  Menu  Clinical staff led group instruction and group discussion with PowerPoint presentation and patient guidebook. To enhance the learning environment the use of posters, models and videos may be added. Using menus brought in from [employer], or printed from [employer], patients will apply the Pritikin dining out guidelines that were presented in the  [employer] video. Patients will also be able to practice these guidelines  in a variety of provided scenarios. The purpose of this lesson is to provide patients with the opportunity to practice hands-on learning of the Pritikin Dining Out guidelines with actual menus and practice scenarios.  Label Reading Clinical staff led group instruction and group discussion with PowerPoint presentation and patient guidebook. To enhance the learning environment the use of posters, models and videos may be added. Patients will review and discuss the Pritikin label reading guidelines presented in Pritikin's Label Reading Educational series video. Using fool labels brought in from local grocery stores and markets, patients will apply the label reading guidelines and determine if the packaged food meet the Pritikin guidelines. The purpose of this lesson is to provide patients with the opportunity to review, discuss, and practice hands-on learning of the Pritikin Label Reading guidelines with actual packaged food labels. Cooking School  Pritikin's [employer] are designed to teach patients ways to prepare quick, simple, and affordable recipes at home. The importance of nutrition's role in chronic disease risk reduction is reflected in its emphasis in the overall Pritikin program. By learning how to prepare essential core Pritikin Eating Plan recipes, patients will increase control over what they eat; be able to customize the flavor of foods without the use of added salt, sugar, or fat; and improve the quality of the food they consume. By learning a set of core recipes which are easily assembled, quickly prepared, and affordable, patients are more likely to prepare more healthy foods at home. These workshops focus on convenient breakfasts, simple entres, side dishes, and desserts which can be prepared with minimal effort and are consistent with nutrition recommendations for cardiovascular risk  reduction. Cooking [employer] are taught by a Armed forces logistics/support/administrative officer (RD) who has been trained by the [employer]. The chef or RD has a clear understanding of the importance of minimizing - if not completely eliminating - added fat, sugar, and sodium in recipes. Throughout the series of Cooking School Workshop sessions, patients will learn about healthy ingredients and efficient methods of cooking to build confidence in their capability to prepare    Cooking School weekly topics:  Adding Flavor- Sodium-Free  Fast and Healthy Breakfasts  Powerhouse Plant-Based Proteins  Satisfying Salads and Dressings  Simple Sides and Sauces  International Cuisine-Spotlight on the [employer] Zones  Delicious Desserts  Savory Soups  [employer] - Meals in a Astronomer Appetizers and Snacks  Comforting Weekend Breakfasts  One-Pot Wonders   Fast Evening Meals  Landscape architect Your Pritikin Plate  WORKSHOPS   Healthy Mindset (Psychosocial):  Focused Goals, Sustainable Changes Clinical staff led group instruction and group discussion with PowerPoint presentation and patient guidebook. To enhance the learning environment the use of posters, models and videos may be added. Patients will be able to apply effective goal setting strategies to establish at least one personal goal, and then take consistent, meaningful action toward that goal. They will learn to identify common barriers to achieving personal goals and develop strategies to overcome them. Patients will also gain an understanding of how our mind-set can impact our ability to achieve goals and the importance of cultivating a positive and growth-oriented mind-set. The purpose of this lesson is to provide patients with a deeper understanding of how to set and achieve personal goals, as well as the tools and strategies needed to overcome common obstacles which may arise along the way.  From Head to Heart: The  Power of a Healthy Outlook  Clinical  staff led group instruction and group discussion with PowerPoint presentation and patient guidebook. To enhance the learning environment the use of posters, models and videos may be added. Patients will be able to recognize and describe the impact of emotions and mood on physical health. They will discover the importance of self-care and explore self-care practices which may work for them. Patients will also learn how to utilize the 4 C's to cultivate a healthier outlook and better manage stress and challenges. The purpose of this lesson is to demonstrate to patients how a healthy outlook is an essential part of maintaining good health, especially as they continue their cardiac rehab journey.  Healthy Sleep for a Healthy Heart Clinical staff led group instruction and group discussion with PowerPoint presentation and patient guidebook. To enhance the learning environment the use of posters, models and videos may be added. At the conclusion of this workshop, patients will be able to demonstrate knowledge of the importance of sleep to overall health, well-being, and quality of life. They will understand the symptoms of, and treatments for, common sleep disorders. Patients will also be able to identify daytime and nighttime behaviors which impact sleep, and they will be able to apply these tools to help manage sleep-related challenges. The purpose of this lesson is to provide patients with a general overview of sleep and outline the importance of quality sleep. Patients will learn about a few of the most common sleep disorders. Patients will also be introduced to the concept of "sleep hygiene," and discover ways to self-manage certain sleeping problems through simple daily behavior changes. Finally, the workshop will motivate patients by clarifying the links between quality sleep and their goals of heart-healthy living.   Recognizing and Reducing Stress Clinical staff led  group instruction and group discussion with PowerPoint presentation and patient guidebook. To enhance the learning environment the use of posters, models and videos may be added. At the conclusion of this workshop, patients will be able to understand the types of stress reactions, differentiate between acute and chronic stress, and recognize the impact that chronic stress has on their health. They will also be able to apply different coping mechanisms, such as reframing negative self-talk. Patients will have the opportunity to practice a variety of stress management techniques, such as deep abdominal breathing, progressive muscle relaxation, and/or guided imagery.  The purpose of this lesson is to educate patients on the role of stress in their lives and to provide healthy techniques for coping with it.  Learning Barriers/Preferences:  Learning Barriers/Preferences - 01/19/23 1311       Learning Barriers/Preferences   Learning Barriers None    Learning Preferences Computer/Internet;Group Instruction;Individual Instruction;Pictoral;Skilled Demonstration;Written Material             Education Topics:  Knowledge Questionnaire Score:  Knowledge Questionnaire Score - 01/19/23 1016       Knowledge Questionnaire Score   Pre Score 18/24             Core Components/Risk Factors/Patient Goals at Admission:  Personal Goals and Risk Factors at Admission - 01/19/23 1012       Core Components/Risk Factors/Patient Goals on Admission    Weight Management Yes    Intervention Weight Management: Develop a combined nutrition and exercise program designed to reach desired caloric intake, while maintaining appropriate intake of nutrient and fiber, sodium and fats, and appropriate energy expenditure required for the weight goal.;Weight Management: Provide education and appropriate resources to help participant work on and attain dietary goals.  Expected Outcomes Short Term: Continue to assess and  modify interventions until short term weight is achieved;Long Term: Adherence to nutrition and physical activity/exercise program aimed toward attainment of established weight goal;Understanding recommendations for meals to include 15-35% energy as protein, 25-35% energy from fat, 35-60% energy from carbohydrates, less than 200mg  of dietary cholesterol, 20-35 gm of total fiber daily;Understanding of distribution of calorie intake throughout the day with the consumption of 4-5 meals/snacks    Diabetes Yes    Intervention Provide education about signs/symptoms and action to take for hypo/hyperglycemia.;Provide education about proper nutrition, including hydration, and aerobic/resistive exercise prescription along with prescribed medications to achieve blood glucose in normal ranges: Fasting glucose 65-99 mg/dL    Expected Outcomes Short Term: Participant verbalizes understanding of the signs/symptoms and immediate care of hyper/hypoglycemia, proper foot care and importance of medication, aerobic/resistive exercise and nutrition plan for blood glucose control.;Long Term: Attainment of HbA1C < 7%.    Heart Failure Yes    Intervention Provide a combined exercise and nutrition program that is supplemented with education, support and counseling about heart failure. Directed toward relieving symptoms such as shortness of breath, decreased exercise tolerance, and extremity edema.    Expected Outcomes Improve functional capacity of life;Short term: Attendance in program 2-3 days a week with increased exercise capacity. Reported lower sodium intake. Reported increased fruit and vegetable intake. Reports medication compliance.;Short term: Daily weights obtained and reported for increase. Utilizing diuretic protocols set by physician.;Long term: Adoption of self-care skills and reduction of barriers for early signs and symptoms recognition and intervention leading to self-care maintenance.    Lipids Yes    Intervention  Provide education and support for participant on nutrition & aerobic/resistive exercise along with prescribed medications to achieve LDL 70mg , HDL >40mg .    Expected Outcomes Short Term: Participant states understanding of desired cholesterol values and is compliant with medications prescribed. Participant is following exercise prescription and nutrition guidelines.;Long Term: Cholesterol controlled with medications as prescribed, with individualized exercise RX and with personalized nutrition plan. Value goals: LDL < 70mg , HDL > 40 mg.             Core Components/Risk Factors/Patient Goals Review:    Core Components/Risk Factors/Patient Goals at Discharge (Final Review):    ITP Comments:  ITP Comments     Row Name 01/19/23 1011           ITP Comments Dr. Armanda Magic medical director. Introduction to [facility] education/ intensive cardiac rehab. Initial orientation packet reviewed with patient.                Comments: Participant attended orientation for the cardiac rehabilitation program on  01/19/2023  to perform initial intake and exercise walk test. Patient introduced to the [facility] education and orientation packet was reviewed. Completed 6-minute walk test, measurements, initial ITP, and exercise prescription. Vital signs stable. Telemetry-normal sinus rhythm, freq PVCs, asymptomatic. Pt had frequent PVCs, couplets, triplets, and 4-7 beat run VT during rest and . RN Engineer, manufacturing and onsite provider Edd Fabian, NP notified. See note for further details.   Service time was from 0952 to 1130.  Jonna Coup, MS, ACSM-CEP 01/19/2023 1:15 PM

## 2023-01-19 NOTE — Progress Notes (Addendum)
[name] in today for Cardiac Rehab orientation with the diagnosis of Chronic Systolic Heart Failure with ICD for secondary prevention placed 11/2022 medically treat CAD as there were no targets.  Pt placed on monitor which showed  PVC's freq triplets, couplets and 4- 7 beat VT & beats longest in duration seen) HR 150's occurring at rest along with  exertion for the 6 minute walk test where he walked for feet 1440, BP 124/70 with cool down bp 110/66.Pre BP 102/62.   Pt asymptomatic and unaware reports compliance of his medication regimen.  Reviewed lab work - K 4.1 and Magnesium 2.0 Showed strips to Edd Fabian NP, supervising provider for cardiac rehab today, ok for pt to go home and to follow up with the referring provider for any additional interventions as well as parameters should he be able to proceed with exercise at Cardiac Rehab. Will forward this note and strips can be reviewed in media under todays cardiac rehab appt. to Dr. Gasper Lloyd and Elberta Fortis. Alanson Aly, BSN Cardiac and Emergency planning/management officer

## 2023-01-21 ENCOUNTER — Telehealth (HOSPITAL_COMMUNITY): Payer: Self-pay | Admitting: *Deleted

## 2023-01-21 NOTE — Telephone Encounter (Signed)
Called and advised pt of the response from both Dr. Elberta Fortis and Dr. Gasper Lloyd.  Rip Harbour to ti return on Friday as planned.  Also advised pt that he should receive a call or MyChart message for instructions for the ZioPatch.  Pt aware of symptoms to alert 911 medical assistance. Alanson Aly, BSN Cardiac and Emergency planning/management officer

## 2023-01-21 NOTE — Telephone Encounter (Signed)
See referring provider response:  Thank you both for your valued input! Daquana Paddock ===View-only below this line=== ----- Message ----- From: Dorthula Nettles, DO Sent: 01/20/2023   5:26 PM EDT To: Will Jorja Loa, MD; *  Functionally he is doing much better now. I would continue cardiac rehab for the time being and place a ziopatch to monitor PVC burden; agree with Will. His options for advanced therapies will be limited so anything else we can do would be great.   Adi ----- Message ----- From: Regan Lemming, MD Sent: 01/19/2023   5:04 PM EDT To: Chelsea Aus, RN; Dorthula Nettles, DO  Seen the patient since he was in the hospital.  I am seeing him back in a month and can assess his PVC burden at that time. Adi, as you have seen him more recently, what are your thoughts on continuing with rehab?  He may benefit from a cardiac monitor to assess his PVC burden. ----- Message ----- From: Chelsea Aus, RN Sent: 01/19/2023  11:55 AM EDT To: Regan Lemming, MD; Dorthula Nettles, DO  Please review my note from todays orientation to cardiac rehab.  Pt with known reduced EF with  asymptomatic frequent ventricular ectopy up to 7 beat VT.  Strips can be viewed in media under todays appt.  1.  Ok to proceed with CR and if so parameters for VT beats ie how long is acceptable? 2. Plan of Care - labs, medication changes or staying the course.  Thanks so much for your input advised pt to hold on returning to exercise next week until we have approval Aryaa Bunting Industrial/product designer, BSN Cardiac and Pulmonary Rehab Nurse Navigator

## 2023-01-26 ENCOUNTER — Encounter (HOSPITAL_COMMUNITY): Payer: Self-pay | Admitting: Cardiology

## 2023-01-28 ENCOUNTER — Ambulatory Visit (HOSPITAL_COMMUNITY)
Admission: RE | Admit: 2023-01-28 | Discharge: 2023-01-28 | Disposition: A | Discharge status: Home / Self Care | Payer: Medicare Other | Source: Ambulatory Visit | Attending: Cardiology | Admitting: Cardiology

## 2023-01-28 ENCOUNTER — Encounter (HOSPITAL_COMMUNITY): Payer: Self-pay

## 2023-01-28 ENCOUNTER — Other Ambulatory Visit (HOSPITAL_COMMUNITY): Payer: Self-pay | Admitting: Cardiology

## 2023-01-28 ENCOUNTER — Encounter (HOSPITAL_COMMUNITY): Payer: Self-pay | Admitting: Cardiology

## 2023-01-28 ENCOUNTER — Encounter (HOSPITAL_COMMUNITY)
Admission: RE | Admit: 2023-01-28 | Discharge: 2023-01-28 | Disposition: A | Discharge status: Home / Self Care | Payer: Medicare Other | Source: Ambulatory Visit | Attending: Cardiology | Admitting: Cardiology

## 2023-01-28 ENCOUNTER — Inpatient Hospital Stay (HOSPITAL_COMMUNITY)
Admission: RE | Admit: 2023-01-28 | Discharge: 2023-01-28 | Disposition: A | Discharge status: Home / Self Care | Payer: No Typology Code available for payment source | Source: Ambulatory Visit | Attending: Cardiology

## 2023-01-28 DIAGNOSIS — I5022 Chronic systolic (congestive) heart failure: Secondary | ICD-10-CM | POA: Insufficient documentation

## 2023-01-28 DIAGNOSIS — I493 Ventricular premature depolarization: Secondary | ICD-10-CM | POA: Insufficient documentation

## 2023-01-28 DIAGNOSIS — Z5189 Encounter for other specified aftercare: Secondary | ICD-10-CM | POA: Diagnosis present

## 2023-01-28 LAB — GLUCOSE, CAPILLARY
Glucose-Capillary: 240 mg/dL — ABNORMAL HIGH (ref 70–99)
Glucose-Capillary: 158 mg/dL — ABNORMAL HIGH (ref 70–99)

## 2023-01-28 NOTE — Progress Notes (Signed)
Zio patch placed onto patient.  All instructions and information reviewed with patient, they verbalize understanding with no questions. 

## 2023-01-28 NOTE — Progress Notes (Addendum)
Daily Session Note  Patient Details  Name: Wensley Barrella. MRN: [id number] Date of Birth: Oct 04, 1955 Referring Provider:   Flowsheet Row INTENSIVE CARDIAC REHAB ORIENT from 01/19/2023 in [facility]  Referring Provider Dorthula Nettles, MD       Encounter Date: 01/28/2023  Check In:  Session Check In - 01/28/23 1112       Check-In   Supervising physician immediately available to respond to emergencies [facility] MD immediately available   Simultaneous filing. User may not have seen previous data.   Physician(s) Eligha Bridegroom NP   Simultaneous filing. User may not have seen previous data.   Location [facility]   Simultaneous filing. User may not have seen previous data.   Staff Present Lorin Picket, MS, ACSM-CEP, CCRP, Exercise Physiologist;Olinty Peggye Pitt, MS, ACSM-CEP, Exercise Physiologist;Jetta Dan Humphreys BS, ACSM-CEP, Exercise Physiologist;Carlette Les Pou, RN, Lin Givens, RN, Physicist, medical. User may not have seen previous data.   Virtual Visit No   Simultaneous filing. User may not have seen previous data.   Medication changes reported     No   Simultaneous filing. User may not have seen previous data.   Fall or balance concerns reported    No   Simultaneous filing. User may not have seen previous data.   Tobacco Cessation No Change   Simultaneous filing. User may not have seen previous data.   Warm-up and Cool-down Performed as group-led instruction   CRP2 orientation  Simultaneous filing. User may not have seen previous data.   Resistance Training Performed Yes   Simultaneous filing. User may not have seen previous data.   VAD Patient? No   Simultaneous filing. User may not have seen previous data.   PAD/SET Patient? No   Simultaneous filing. User may not have seen previous data.     Pain Assessment   Currently in Pain? No/denies   Simultaneous filing. User may not have seen previous  data.   Pain Score 0-No pain   Simultaneous filing. User may not have seen previous data.   Multiple Pain Sites No   Simultaneous filing. User may not have seen previous data.            Capillary Blood Glucose: Results for orders placed or performed during the hospital encounter of 01/28/23 (from the past 24 hour(s))  Glucose, capillary     Status: Abnormal   Collection Time: 01/28/23 10:21 AM  Result Value Ref Range   Glucose-Capillary 240 (H) 70 - 99 mg/dL  Glucose, capillary     Status: Abnormal   Collection Time: 01/28/23 11:14 AM  Result Value Ref Range   Glucose-Capillary 158 (H) 70 - 99 mg/dL     Exercise Prescription Changes - 01/28/23 1022       Response to Exercise   Blood Pressure (Admit) 118/58    Blood Pressure (Exercise) 130/78    Blood Pressure (Exit) 112/60    Heart Rate (Admit) 69 bpm    Heart Rate (Exercise) 83 bpm    Heart Rate (Exit) 69 bpm    Rating of Perceived Exertion (Exercise) 11    Symptoms None    Comments Off to a good start with exercise.    Duration Continue with 30 min of aerobic exercise without signs/symptoms of physical distress.    Intensity THRR unchanged      Progression   Progression Continue to progress workloads to maintain intensity without signs/symptoms of physical distress.  Average METs 1.7      Resistance Training   Training Prescription Yes    Weight 3    Reps 10-15    Time 10 Minutes      Interval Training   Interval Training No      Recumbant Bike   Level 2    RPM 50    Watts 13    Minutes 15    METs 1.9      Arm Ergometer   Level 1    Watts 9    RPM 35    Minutes 15    METs 1.5             Social History   Tobacco Use  Smoking Status Former   Current packs/day: 0.00   Types: Cigarettes   Quit date: 05/05/2002   Years since quitting: 20.7   Passive exposure: Never  Smokeless Tobacco Never    Goals Met:  Exercise tolerated well No report of concerns or symptoms today Strength  training completed today  Goals Unmet:  Not Applicable  Comments: Pt started cardiac rehab today.  Pt tolerated light exercise without difficulty. VSS, telemetry-Sinus Rhythm with frequent PVC's POD, asymptomatic.  Medication list reconciled. Pt denies barriers to medicaiton compliance.  PSYCHOSOCIAL ASSESSMENT:  PHQ-0. Pt exhibits positive coping skills, hopeful outlook with supportive family. No psychosocial needs identified at this time, no psychosocial interventions necessary.    Pt enjoys spending time with family, fixing things and feral cats .   Pt oriented to exercise equipment and routine.    Understanding verbalized. Thayer Headings RN BSN    Dr. Armanda Magic is Medical Director for Cardiac Rehab at [facility].

## 2023-01-31 ENCOUNTER — Encounter (HOSPITAL_COMMUNITY): Admission: RE | Admit: 2023-01-31 | Payer: Medicare Other | Source: Ambulatory Visit

## 2023-01-31 DIAGNOSIS — I5022 Chronic systolic (congestive) heart failure: Secondary | ICD-10-CM | POA: Diagnosis not present

## 2023-01-31 LAB — GLUCOSE, CAPILLARY: Glucose-Capillary: 106 mg/dL — ABNORMAL HIGH (ref 70–99)

## 2023-01-31 NOTE — Progress Notes (Incomplete)
Cardiac Individual Treatment Plan  Patient Details  Name: [name]. MRN: [id number] Date of Birth: Apr 10, 1956 Referring Provider:   Flowsheet Row INTENSIVE CARDIAC REHAB ORIENT from 01/19/2023 in [facility]  Referring Provider Dorthula Nettles, MD       Initial Encounter Date:  Flowsheet Row INTENSIVE CARDIAC REHAB ORIENT from 01/19/2023 in [facility]  Date 01/19/23       Visit Diagnosis: Heart failure, chronic systolic (HCC)  Patient's Home Medications on Admission:  Current Outpatient Medications:    aspirin 81 MG chewable tablet, Chew 81 mg by mouth daily., Disp: , Rfl:    b complex vitamins capsule, Take 1 capsule by mouth daily., Disp: , Rfl:    carvedilol (COREG) 6.25 MG tablet, Take 1 tablet (6.25 mg total) by mouth 2 (two) times daily with a meal., Disp: 60 tablet, Rfl: 2   Cholecalciferol (VITAMIN D3) 50 MCG (2000 UT) TABS, Take 2,000 Units by mouth daily., Disp: , Rfl:    diclofenac Sodium (VOLTAREN) 1 % GEL, Apply 2 g topically daily as needed (pain)., Disp: , Rfl:    diphenhydrAMINE (BENADRYL ALLERGY) 25 MG tablet, Take 25 mg by mouth as needed., Disp: , Rfl:    empagliflozin (JARDIANCE) 10 MG TABS tablet, Take 1 tablet (10 mg total) by mouth daily., Disp: 30 tablet, Rfl: 3   loratadine (CLARITIN) 10 MG tablet, Take 10 mg by mouth daily. (Patient not taking: Reported on 01/05/2023), Disp: , Rfl:    magnesium oxide (MAG-OX) 400 (240 Mg) MG tablet, Take 400 mg by mouth at bedtime., Disp: , Rfl:    pantoprazole (PROTONIX) 40 MG tablet, TAKE 1 TABLET (40 MG TOTAL) BY MOUTH DAILY., Disp: 90 tablet, Rfl: 0   rosuvastatin (CRESTOR) 20 MG tablet, Take 1 tablet (20 mg total) by mouth daily., Disp: 90 tablet, Rfl: 3   sacubitril-valsartan (ENTRESTO) 49-51 MG, Take 1 tablet by mouth 2 (two) times daily., Disp: 60 tablet, Rfl: 11   Semaglutide,0.25 or  0.5MG /DOS, (OZEMPIC, 0.25 OR 0.5 MG/DOSE,) 2 MG/1.5ML SOPN, Inject 0.5 mg into the skin once a week. If unavailable, please recommend alternative., Disp: 1.5 mL, Rfl: 3   spironolactone (ALDACTONE) 25 MG tablet, Take 25 mg by mouth daily. 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 LICA, ,, 0-39%LICA, s/p Right CEA 2018   CHF (congestive heart failure) (HCC)    Closed head injury with concussion    x2   Complication of anesthesia    Low blood pressure after colonoscopy   Diabetes mellitus without complication (HCC)    Dizziness    Ejection fraction < 50%    EF 40%, echo 2006, no ICD needed  / EF 40%, echo, 11/2009   Former cigarette smoker    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 Georganna Skeans   Sleep disorder    Dr Shelle Iron, lose weight and consider sleep study later   Sleep disorder    TIA (transient ischemic attack)    by history   Volume overload     Tobacco Use: Social History   Tobacco Use  Smoking Status Former   Current packs/day: 0.00   Types: Cigarettes   Quit date: 05/05/2002   Years since quitting: 20.7   Passive exposure: Never  Smokeless Tobacco Never    Labs: Review Flowsheet  More data exists      Latest Ref Rng & Units 07/23/2021 09/29/2021 10/28/2022 10/29/2022 12/08/2022  Labs for ITP Cardiac and Pulmonary Rehab  Cholestrol 0 - 200 mg/dL - - - - 161   LDL (calc) 0 - 99 mg/dL - - - - 68    HDL-C >09 mg/dL - - - - 34   Trlycerides <150 mg/dL - - - - 604   Hemoglobin A1c 4.8 - 5.6 % 6.3  7.2  6.5  - -  PH, Arterial 7.35 - 7.45 - - 7.344  7.296  7.287  7.316  -  PCO2 arterial 32 - 48 mmHg - - 34.0  41.2  41.5  40.1  -  Bicarbonate 20.0 - 28.0 mmol/L - - 18.7  20.1  19.9  24.4  20.5  -  TCO2 22 - 32 mmol/L - - 20  21  21  26  22   -  Acid-base deficit 0.0 - 2.0 mmol/L - - 6.0  6.0  7.0  2.0  5.0  -  O2 Saturation % - - 96  100  88  59  91  -    Details       Multiple values from one day are sorted in reverse-chronological order         Capillary Blood Glucose: Lab Results  Component Value Date   GLUCAP 106 (H) 01/31/2023   GLUCAP 158 (H) 01/28/2023   GLUCAP 240 (H) 01/28/2023   GLUCAP 180 (H) 11/18/2022   GLUCAP 145 (H) 11/17/2022     Exercise Target Goals: Exercise Program Goal: Individual exercise prescription set using results from initial 6 min walk test and THRR while considering  patient's activity barriers and safety.   Exercise Prescription Goal: Initial exercise prescription builds to 30-45 minutes a day of aerobic activity, 2-3 days per week.  Home exercise guidelines will be given to patient during program as part of exercise prescription that the participant will acknowledge.  Activity Barriers & Risk Stratification:  Activity Barriers & Cardiac Risk Stratification - 01/19/23 1308       Activity Barriers & Cardiac Risk Stratification   Activity Barriers Decreased Ventricular Function;Deconditioning;Joint Problems    Cardiac Risk Stratification High   <5 METs on            6 Minute Walk:  6 Minute Walk     Row Name 01/19/23 1307         6 Minute Walk   Phase Initial     Distance 1440 feet     Walk Time 6 minutes     # of Rest Breaks 0     MPH 2.73     METS 3.01     RPE 9     Perceived Dyspnea  0     VO2 Peak 10.55     Symptoms No  Resting HR 64 bpm     Resting BP 102/62     Resting Oxygen Saturation  97 %      Exercise Oxygen Saturation  during 6 min walk 98 %     Max Ex. HR 91 bpm     Max Ex. BP 124/70     2 Minute Post BP 110/66              Oxygen Initial Assessment:   Oxygen Re-Evaluation:   Oxygen Discharge (Final Oxygen Re-Evaluation):   Initial Exercise Prescription:  Initial Exercise Prescription - 01/19/23 1300       Date of Initial Exercise RX and Referring Provider   Date 01/19/23    Referring Provider Dorthula Nettles, MD    Expected Discharge Date 04/13/23      Recumbant Bike   Level 2    RPM 60    Watts 30    Minutes 15    METs 2.7      Arm Ergometer   Level 1    Watts 20    RPM 50    Minutes 15    METs 2.5      Prescription Details   Frequency (times per week) 3    Duration Progress to 30 minutes of continuous aerobic without signs/symptoms of physical distress      Intensity   THRR 40-80% of Max Heartrate 61-121    Ratings of Perceived Exertion 11-13    Perceived Dyspnea 0-4      Progression   Progression Continue progressive overload as per policy without signs/symptoms or physical distress.      Resistance Training   Training Prescription Yes    Weight 3    Reps 10-15             Perform Capillary Blood Glucose checks as needed.  Exercise Prescription Changes:   Exercise Prescription Changes     Row Name 01/28/23 1022 01/31/23 1024           Response to Exercise   Blood Pressure (Admit) 118/58 98/70      Blood Pressure (Exercise) 130/78 122/60      Blood Pressure (Exit) 112/60 94/60      Heart Rate (Admit) 69 bpm 69 bpm      Heart Rate (Exercise) 83 bpm 86 bpm      Heart Rate (Exit) 69 bpm 63 bpm      Rating of Perceived Exertion (Exercise) 11 11      Symptoms None None      Comments Off to a good start with exercise. Reviewed METs      Duration Continue with 30 min of aerobic exercise without signs/symptoms of physical distress. Continue with 30 min of aerobic exercise without signs/symptoms of physical distress.       Intensity THRR unchanged THRR unchanged        Progression   Progression Continue to progress workloads to maintain intensity without signs/symptoms of physical distress. Continue to progress workloads to maintain intensity without signs/symptoms of physical distress.      Average METs 1.7 1.9        Resistance Training   Training Prescription Yes Yes      Weight 3 3      Reps 10-15 10-15      Time 10 Minutes 10 Minutes        Interval Training   Interval Training No No        Recumbant Bike   Level 2 2  RPM 50 --      Watts 13 --      Minutes 15 15      METs 1.9 2.3        Arm Ergometer   Level 1 1      Watts 9 13      RPM 35 50      Minutes 15 15      METs 1.5 1.6               Exercise Comments:   Exercise Comments     Row Name 01/28/23 1130 01/31/23 1114         Exercise Comments [name] tolerated low intesnity exericse well without symptoms. Introduced patient to exercise routine and warm-up and cool-down stretches. Reviewed METs with patient.               Exercise Goals and Review:   Exercise Goals     Row Name 01/19/23 1012             Exercise Goals   Increase Physical Activity Yes       Intervention Provide advice, education, support and counseling about physical activity/exercise needs.;Develop an individualized exercise prescription for aerobic and resistive training based on initial evaluation findings, risk stratification, comorbidities and participant's personal goals.       Expected Outcomes Short Term: Attend rehab on a regular basis to increase amount of physical activity.;Long Term: Add in home exercise to make exercise part of routine and to increase amount of physical activity.;Long Term: Exercising regularly at least 3-5 days a week.       Increase Strength and Stamina Yes       Intervention Provide advice, education, support and counseling about physical activity/exercise needs.;Develop an individualized exercise  prescription for aerobic and resistive training based on initial evaluation findings, risk stratification, comorbidities and participant's personal goals.       Expected Outcomes Short Term: Increase workloads from initial exercise prescription for resistance, speed, and METs.;Short Term: Perform resistance training exercises routinely during rehab and add in resistance training at home;Long Term: Improve cardiorespiratory fitness, muscular endurance and strength as measured by increased METs and functional capacity ( )       Able to understand and use rate of perceived exertion (RPE) scale Yes       Intervention Provide education and explanation on how to use RPE scale       Expected Outcomes Short Term: Able to use RPE daily in rehab to express subjective intensity level;Long Term:  Able to use RPE to guide intensity level when exercising independently       Knowledge and understanding of Target Heart Rate Range (THRR) Yes       Intervention Provide education and explanation of THRR including how the numbers were predicted and where they are located for reference       Expected Outcomes Short Term: Able to state/look up THRR;Short Term: Able to use daily as guideline for intensity in rehab;Long Term: Able to use THRR to govern intensity when exercising independently       Understanding of Exercise Prescription Yes       Intervention Provide education, explanation, and written materials on patient's individual exercise prescription       Expected Outcomes Short Term: Able to explain program exercise prescription;Long Term: Able to explain home exercise prescription to exercise independently                Exercise Goals Re-Evaluation :  Exercise  Goals Re-Evaluation     Row Name 01/28/23 1130             Exercise Goal Re-Evaluation   Exercise Goals Review Increase Physical Activity;Increase Strength and Stamina;Able to understand and use rate of perceived exertion (RPE) scale        Comments [name] was able to understand and use RPE scale appropriately.       Expected Outcomes Progress workloads as tolerated to help increase cardiorespiratory fitness.                Discharge Exercise Prescription (Final Exercise Prescription Changes):  Exercise Prescription Changes - 01/31/23 1024       Response to Exercise   Blood Pressure (Admit) 98/70    Blood Pressure (Exercise) 122/60    Blood Pressure (Exit) 94/60    Heart Rate (Admit) 69 bpm    Heart Rate (Exercise) 86 bpm    Heart Rate (Exit) 63 bpm    Rating of Perceived Exertion (Exercise) 11    Symptoms None    Comments Reviewed METs    Duration Continue with 30 min of aerobic exercise without signs/symptoms of physical distress.    Intensity THRR unchanged      Progression   Progression Continue to progress workloads to maintain intensity without signs/symptoms of physical distress.    Average METs 1.9      Resistance Training   Training Prescription Yes    Weight 3    Reps 10-15    Time 10 Minutes      Interval Training   Interval Training No      Recumbant Bike   Level 2    Minutes 15    METs 2.3      Arm Ergometer   Level 1    Watts 13    RPM 50    Minutes 15    METs 1.6             Nutrition:  Target Goals: Understanding of nutrition guidelines, daily intake of sodium 1500mg , cholesterol 200mg , calories 30% from fat and 7% or less from saturated fats, daily to have 5 or more servings of fruits and vegetables.  Biometrics:  Pre Biometrics - 01/19/23 1306       Pre Biometrics   Waist Circumference 48 inches    Hip Circumference 44 inches    Waist to Hip Ratio 1.09 %    Triceps Skinfold 28 mm    % Body Fat 34.5 %    Grip Strength 34 kg    Flexibility 11.25 in    Single Leg Stand 30 seconds              Nutrition Therapy Plan and Nutrition Goals:  Nutrition Therapy & Goals - 01/28/23 1110       Nutrition Therapy   Diet Heart healthy/carbohydrate consistent diet     Drug/Food Interactions Statins/Certain Fruits      Personal Nutrition Goals   Nutrition Goal Patient to identify strategies for reducing cardiovascular risk by attending the [employer] education and nutrition series weekly.    Personal Goal #2 Patient to improve diet quality by using the plate method as a guide for meal planning to include lean protein/plant protein, fruits, vegetables, whole grains, nonfat dairy as part of a well-balanced diet.    Personal Goal #3 Patient to identify food sources and limit daily intake of saturated fat, trans fat, sodium, and refined carbohydrates.    Comments [name] reports that he and his wife  have started making some changes including switched to non-nutrititve sweeteners and increased dietary fiber. He reports taking ozempic to aid with cardiovascular risk and blood sugar control. He reports am/fasting blood sugars ~130s. He continues to work on reducing saturated fat intake. Patient will benefit from participation in intensive cardiac rehab for nutrition, exercise, and lifestyle modification.      Intervention Plan   Intervention Prescribe, educate and counsel regarding individualized specific dietary modifications aiming towards targeted core components such as weight, hypertension, lipid management, diabetes, heart failure and other comorbidities.;Nutrition handout(s) given to patient.    Expected Outcomes Short Term Goal: Understand basic principles of dietary content, such as calories, fat, sodium, cholesterol and nutrients.;Long Term Goal: Adherence to prescribed nutrition plan.             Nutrition Assessments:  MEDIFICTS Score Key: ?70 Need to make dietary changes  40-70 Heart Healthy Diet ? 40 Therapeutic Level Cholesterol Diet    Picture Your Plate Scores: <14 Unhealthy dietary pattern with much room for improvement. 41-50 Dietary pattern unlikely to meet recommendations for good health and room for improvement. 51-60 More healthful dietary  pattern, with some room for improvement.  >60 Healthy dietary pattern, although there may be some specific behaviors that could be improved.    Nutrition Goals Re-Evaluation:  Nutrition Goals Re-Evaluation     Row Name 01/28/23 1110             Goals   Current Weight 223 lb 1.7 oz (101.2 kg)       Comment triglycerides 155, HDL 34, lipoproteinA WNL       Expected Outcome [name] reports that he and his wife have started making some changes including switched to non-nutrititve sweeteners and increased dietary fiber. He reports taking ozempic to aid with cardiovascular risk and blood sugar control. He reports am/fasting blood sugars ~130s. He continues to work on reducing saturated fat intake. Patient will benefit from participation in intensive cardiac rehab for nutrition, exercise, and lifestyle modification.                Nutrition Goals Re-Evaluation:  Nutrition Goals Re-Evaluation     Row Name 01/28/23 1110             Goals   Current Weight 223 lb 1.7 oz (101.2 kg)       Comment triglycerides 155, HDL 34, lipoproteinA WNL       Expected Outcome [name] reports that he and his wife have started making some changes including switched to non-nutrititve sweeteners and increased dietary fiber. He reports taking ozempic to aid with cardiovascular risk and blood sugar control. He reports am/fasting blood sugars ~130s. He continues to work on reducing saturated fat intake. Patient will benefit from participation in intensive cardiac rehab for nutrition, exercise, and lifestyle modification.                Nutrition Goals Discharge (Final Nutrition Goals Re-Evaluation):  Nutrition Goals Re-Evaluation - 01/28/23 1110       Goals   Current Weight 223 lb 1.7 oz (101.2 kg)    Comment triglycerides 155, HDL 34, lipoproteinA WNL    Expected Outcome [name] reports that he and his wife have started making some changes including switched to non-nutrititve sweeteners and increased dietary  fiber. He reports taking ozempic to aid with cardiovascular risk and blood sugar control. He reports am/fasting blood sugars ~130s. He continues to work on reducing saturated fat intake. Patient will benefit from participation in intensive  cardiac rehab for nutrition, exercise, and lifestyle modification.             Psychosocial: Target Goals: Acknowledge presence or absence of significant depression and/or stress, maximize coping skills, provide positive support system. Participant is able to verbalize types and ability to use techniques and skills needed for reducing stress and depression.  Initial Review & Psychosocial Screening:  Initial Psych Review & Screening - 01/19/23 1311       Initial Review   Current issues with None Identified      Family Dynamics   Good Support System? Yes   [name] has his wife for support     Barriers   Psychosocial barriers to participate in program There are no identifiable barriers or psychosocial needs.      Screening Interventions   Interventions Encouraged to exercise;Provide feedback about the scores to participant    Expected Outcomes Short Term goal: Identification and review with participant of any Quality of Life or Depression concerns found by scoring the questionnaire.;Long Term goal: The participant improves quality of Life and PHQ9 Scores as seen by post scores and/or verbalization of changes             Quality of Life Scores:  Quality of Life - 01/19/23 1311       Quality of Life   Select Quality of Life      Quality of Life Scores   Health/Function Pre 24.7 %    Socioeconomic Pre 27.69 %    Psych/Spiritual Pre 27.86 %    Family Pre 30 %    GLOBAL Pre 26.77 %            Scores of 19 and below usually indicate a poorer quality of life in these areas.  A difference of  2-3 points is a clinically meaningful difference.  A difference of 2-3 points in the total score of the Quality of Life Index has been associated with  significant improvement in overall quality of life, self-image, physical symptoms, and general health in studies assessing change in quality of life.  PHQ-9: Review Flowsheet  More data exists      01/19/2023 11/23/2022 07/06/2022 07/03/2021 03/03/2021  Depression screen PHQ 2/9  Decreased Interest 0 0 0 0 0 0  Down, Depressed, Hopeless 0 0 0 0 0 0  PHQ - 2 Score 0 0 0 0 0 0  Altered sleeping 0 0 - - 0  Tired, decreased energy 0 0 - - 0  Change in appetite 0 0 - - 0  Feeling bad or failure about yourself  0 0 - - 0  Trouble concentrating 0 0 - - 0  Moving slowly or fidgety/restless 0 0 - - 0  Suicidal thoughts 0 0 - - 0  PHQ-9 Score 0 0 - - 0  Difficult doing work/chores - Not difficult at all - - -    Details       Multiple values from one day are sorted in reverse-chronological order        Interpretation of Total Score  Total Score Depression Severity:  1-4 = Minimal depression, 5-9 = Mild depression, 10-14 = Moderate depression, 15-19 = Moderately severe depression, 20-27 = Severe depression   Psychosocial Evaluation and Intervention:   Psychosocial Re-Evaluation:  Psychosocial Re-Evaluation     Row Name 01/28/23 1649             Psychosocial Re-Evaluation   Current issues with None Identified  Interventions Encouraged to attend Cardiac Rehabilitation for the exercise       Continue Psychosocial Services  No Follow up required                Psychosocial Discharge (Final Psychosocial Re-Evaluation):  Psychosocial Re-Evaluation - 01/28/23 1649       Psychosocial Re-Evaluation   Current issues with None Identified    Interventions Encouraged to attend Cardiac Rehabilitation for the exercise    Continue Psychosocial Services  No Follow up required             Vocational Rehabilitation: Provide vocational rehab assistance to qualifying candidates.   Vocational Rehab Evaluation & Intervention:   Education: Education Goals: Education classes  will be provided on a weekly basis, covering required topics. Participant will state understanding/return demonstration of topics presented.    Education     Row Name 01/28/23 1300     Education   Cardiac Education Topics [employer]   Licensed conveyancer Nutrition   Nutrition Vitamins and Minerals   Instruction Review Code 1- Verbalizes Understanding   Class Start Time 1145   Class Stop Time 1230   Class Time Calculation (min) 45 min    Row Name 01/31/23 1100     Education   Cardiac Education Topics [facility]   Geographical information systems officer Psychosocial   Psychosocial Workshop Healthy Sleep for a Healthy Heart   Instruction Review Code 1- Verbalizes Understanding   Class Start Time 1148   Class Stop Time 1235   Class Time Calculation (min) 47 min            Core Videos: Exercise    Move It!  Clinical staff conducted group or individual video education with verbal and written material and guidebook.  Patient learns the recommended [employer] exercise program. Exercise with the goal of living a long, healthy life. Some of the health benefits of exercise include controlled diabetes, healthier blood pressure levels, improved cholesterol levels, improved heart and lung capacity, improved sleep, and better body composition. Everyone should speak with their doctor before starting or changing an exercise routine.  Biomechanical Limitations Clinical staff conducted group or individual video education with verbal and written material and guidebook.  Patient learns how biomechanical limitations can impact exercise and how we can mitigate and possibly overcome limitations to have an impactful and balanced exercise routine.  Body Composition Clinical staff conducted group or individual video education with verbal and written material and guidebook.  Patient learns that body composition (ratio of  muscle mass to fat mass) is a key component to assessing overall fitness, rather than body weight alone. Increased fat mass, especially visceral belly fat, can put [place] at increased risk for metabolic syndrome, type 2 diabetes, heart disease, and even death. It is recommended to combine diet and exercise (cardiovascular and resistance training) to improve your body composition. Seek guidance from your physician and exercise physiologist before implementing an exercise routine.  Exercise Action Plan Clinical staff conducted group or individual video education with verbal and written material and guidebook.  Patient learns the recommended strategies to achieve and enjoy long-term exercise adherence, including variety, self-motivation, self-efficacy, and positive decision making. Benefits of exercise include fitness, good health, weight management, more energy, better sleep, less stress, and overall well-being.  Medical   Heart Disease Risk Reduction Clinical staff conducted group or individual  video education with verbal and written material and guidebook.  Patient learns our heart is our most vital organ as it circulates oxygen, nutrients, white blood cells, and hormones throughout the entire body, and carries waste away. Data supports a plant-based eating plan like the Pritikin Program for its effectiveness in slowing progression of and reversing heart disease. The video provides a number of recommendations to address heart disease.   Metabolic Syndrome and Belly Fat  Clinical staff conducted group or individual video education with verbal and written material and guidebook.  Patient learns what metabolic syndrome is, how it leads to heart disease, and how one can reverse it and keep it from coming back. You have metabolic syndrome if you have 3 of the following 5 criteria: abdominal obesity, high blood pressure, high triglycerides, low HDL cholesterol, and high blood sugar.  Hypertension and Heart  Disease Clinical staff conducted group or individual video education with verbal and written material and guidebook.  Patient learns that high blood pressure, or hypertension, is very common in the [place]. Hypertension is largely due to excessive salt intake, but other important risk factors include being overweight, physical inactivity, drinking too much alcohol, smoking, and not eating enough potassium from fruits and vegetables. High blood pressure is a leading risk factor for heart attack, stroke, congestive heart failure, dementia, kidney failure, and premature death. Long-term effects of excessive salt intake include stiffening of the arteries and thickening of heart muscle and organ damage. Recommendations include ways to reduce hypertension and the risk of heart disease.  Diseases of Our Time - Focusing on Diabetes Clinical staff conducted group or individual video education with verbal and written material and guidebook.  Patient learns why the best way to stop diseases of our time is prevention, through food and other lifestyle changes. Medicine (such as prescription pills and surgeries) is often only a Band-Aid on the problem, not a long-term solution. Most common diseases of our time include obesity, type 2 diabetes, hypertension, heart disease, and cancer. The Pritikin Program is recommended and has been proven to help reduce, reverse, and/or prevent the damaging effects of metabolic syndrome.  Nutrition   Overview of the Pritikin Eating Plan  Clinical staff conducted group or individual video education with verbal and written material and guidebook.  Patient learns about the Pritikin Eating Plan for disease risk reduction. The Pritikin Eating Plan emphasizes a wide variety of unrefined, minimally-processed carbohydrates, like fruits, vegetables, whole grains, and legumes. Go, Caution, and Stop food choices are explained. Plant-based and lean animal proteins are emphasized. Rationale  provided for low sodium intake for blood pressure control, low added sugars for blood sugar stabilization, and low added fats and oils for coronary artery disease risk reduction and weight management.  Calorie Density  Clinical staff conducted group or individual video education with verbal and written material and guidebook.  Patient learns about calorie density and how it impacts the Pritikin Eating Plan. Knowing the characteristics of the food you choose will help you decide whether those foods will lead to weight gain or weight loss, and whether you want to consume more or less of them. Weight loss is usually a side effect of the Pritikin Eating Plan because of its focus on low calorie-dense foods.  Label Reading  Clinical staff conducted group or individual video education with verbal and written material and guidebook.  Patient learns about the Pritikin recommended label reading guidelines and corresponding recommendations regarding calorie density, added sugars, sodium content, and whole grains.  Dining Out - Part 1  Clinical staff conducted group or individual video education with verbal and written material and guidebook.  Patient learns that restaurant meals can be sabotaging because they can be so high in calories, fat, sodium, and/or sugar. Patient learns recommended strategies on how to positively address this and avoid unhealthy pitfalls.  Facts on Fats  Clinical staff conducted group or individual video education with verbal and written material and guidebook.  Patient learns that lifestyle modifications can be just as effective, if not more so, as many medications for lowering your risk of heart disease. A Pritikin lifestyle can help to reduce your risk of inflammation and atherosclerosis (cholesterol build-up, or plaque, in the artery walls). Lifestyle interventions such as dietary choices and physical activity address the cause of atherosclerosis. A review of the types of fats and  their impact on blood cholesterol levels, along with dietary recommendations to reduce fat intake is also included.  Nutrition Action Plan  Clinical staff conducted group or individual video education with verbal and written material and guidebook.  Patient learns how to incorporate Pritikin recommendations into their lifestyle. Recommendations include planning and keeping personal health goals in mind as an important part of their success.  Healthy Mind-Set    Healthy Minds, Bodies, Hearts  Clinical staff conducted group or individual video education with verbal and written material and guidebook.  Patient learns how to identify when they are stressed. Video will discuss the impact of that stress, as well as the many benefits of stress management. Patient will also be introduced to stress management techniques. The way we think, act, and feel has an impact on our hearts.  How Our Thoughts Can Heal Our Hearts  Clinical staff conducted group or individual video education with verbal and written material and guidebook.  Patient learns that negative thoughts can cause depression and anxiety. This can result in negative lifestyle behavior and serious health problems. Cognitive behavioral therapy is an effective method to help control our thoughts in order to change and improve our emotional outlook.  Additional Videos:  Exercise    Improving Performance  Clinical staff conducted group or individual video education with verbal and written material and guidebook.  Patient learns to use a non-linear approach by alternating intensity levels and lengths of time spent exercising to help burn more calories and lose more body fat. Cardiovascular exercise helps improve heart health, metabolism, hormonal balance, blood sugar control, and recovery from fatigue. Resistance training improves strength, endurance, balance, coordination, reaction time, metabolism, and muscle mass. Flexibility exercise improves  circulation, posture, and balance. Seek guidance from your physician and exercise physiologist before implementing an exercise routine and learn your capabilities and proper form for all exercise.  Introduction to Yoga  Clinical staff conducted group or individual video education with verbal and written material and guidebook.  Patient learns about yoga, a discipline of the coming together of mind, breath, and body. The benefits of yoga include improved flexibility, improved range of motion, better posture and core strength, increased lung function, weight loss, and positive self-image. Yoga's heart health benefits include lowered blood pressure, healthier heart rate, decreased cholesterol and triglyceride levels, improved immune function, and reduced stress. Seek guidance from your physician and exercise physiologist before implementing an exercise routine and learn your capabilities and proper form for all exercise.  Medical   Aging: Enhancing Your Quality of Life  Clinical staff conducted group or individual video education with verbal and written material and guidebook.  Patient learns  key strategies and recommendations to stay in good physical health and enhance quality of life, such as prevention strategies, having an advocate, securing a Health Care Proxy and Power of Attorney, and keeping a list of medications and system for tracking them. It also discusses how to avoid risk for bone loss.  Biology of Weight Control  Clinical staff conducted group or individual video education with verbal and written material and guidebook.  Patient learns that weight gain occurs because we consume more calories than we burn (eating more, moving less). Even if your body weight is normal, you may have higher ratios of fat compared to muscle mass. Too much body fat puts you at increased risk for cardiovascular disease, heart attack, stroke, type 2 diabetes, and obesity-related cancers. In addition to exercise,  following the Pritikin Eating Plan can help reduce your risk.  Decoding Lab Results  Clinical staff conducted group or individual video education with verbal and written material and guidebook.  Patient learns that lab test reflects one measurement whose values change over time and are influenced by many factors, including medication, stress, sleep, exercise, food, hydration, pre-existing medical conditions, and more. It is recommended to use the knowledge from this video to become more involved with your lab results and evaluate your numbers to speak with your doctor.   Diseases of Our Time - Overview  Clinical staff conducted group or individual video education with verbal and written material and guidebook.  Patient learns that according to the CDC, 50% to 70% of chronic diseases (such as obesity, type 2 diabetes, elevated lipids, hypertension, and heart disease) are avoidable through lifestyle improvements including healthier food choices, listening to satiety cues, and increased physical activity.  Sleep Disorders Clinical staff conducted group or individual video education with verbal and written material and guidebook.  Patient learns how good quality and duration of sleep are important to overall health and well-being. Patient also learns about sleep disorders and how they impact health along with recommendations to address them, including discussing with a physician.  Nutrition  Dining Out - Part 2 Clinical staff conducted group or individual video education with verbal and written material and guidebook.  Patient learns how to plan ahead and communicate in order to maximize their dining experience in a healthy and nutritious manner. Included are recommended food choices based on the type of restaurant the patient is visiting.   Fueling a Banker conducted group or individual video education with verbal and written material and guidebook.  There is a strong  connection between our food choices and our health. Diseases like obesity and type 2 diabetes are very prevalent and are in large-part due to lifestyle choices. The Pritikin Eating Plan provides plenty of food and hunger-curbing satisfaction. It is easy to follow, affordable, and helps reduce health risks.  Menu Workshop  Clinical staff conducted group or individual video education with verbal and written material and guidebook.  Patient learns that restaurant meals can sabotage health goals because they are often packed with calories, fat, sodium, and sugar. Recommendations include strategies to plan ahead and to communicate with the manager, chef, or server to help order a healthier meal.  Planning Your Eating Strategy  Clinical staff conducted group or individual video education with verbal and written material and guidebook.  Patient learns about the Pritikin Eating Plan and its benefit of reducing the risk of disease. The Pritikin Eating Plan does not focus on calories. Instead, it emphasizes high-quality, nutrient-rich foods. By  knowing the characteristics of the foods, we choose, we can determine their calorie density and make informed decisions.  Targeting Your Nutrition Priorities  Clinical staff conducted group or individual video education with verbal and written material and guidebook.  Patient learns that lifestyle habits have a tremendous impact on disease risk and progression. This video provides eating and physical activity recommendations based on your personal health goals, such as reducing LDL cholesterol, losing weight, preventing or controlling type 2 diabetes, and reducing high blood pressure.  Vitamins and Minerals  Clinical staff conducted group or individual video education with verbal and written material and guidebook.  Patient learns different ways to obtain key vitamins and minerals, including through a recommended healthy diet. It is important to discuss all supplements  you take with your doctor.   Healthy Mind-Set    Smoking Cessation  Clinical staff conducted group or individual video education with verbal and written material and guidebook.  Patient learns that cigarette smoking and tobacco addiction pose a serious health risk which affects millions of people. Stopping smoking will significantly reduce the risk of heart disease, lung disease, and many forms of cancer. Recommended strategies for quitting are covered, including working with your doctor to develop a successful plan.  Culinary   Becoming a Set designer conducted group or individual video education with verbal and written material and guidebook.  Patient learns that cooking at home can be healthy, cost-effective, quick, and puts them in control. Keys to cooking healthy recipes will include looking at your recipe, assessing your equipment needs, planning ahead, making it simple, choosing cost-effective seasonal ingredients, and limiting the use of added fats, salts, and sugars.  Cooking - Breakfast and Snacks  Clinical staff conducted group or individual video education with verbal and written material and guidebook.  Patient learns how important breakfast is to satiety and nutrition through the entire day. Recommendations include key foods to eat during breakfast to help stabilize blood sugar levels and to prevent overeating at meals later in the day. Planning ahead is also a key component.  Cooking - Educational psychologist conducted group or individual video education with verbal and written material and guidebook.  Patient learns eating strategies to improve overall health, including an approach to cook more at home. Recommendations include thinking of animal protein as a side on your plate rather than center stage and focusing instead on lower calorie dense options like vegetables, fruits, whole grains, and plant-based proteins, such as beans. Making sauces in large  quantities to freeze for later and leaving the skin on your vegetables are also recommended to maximize your experience.  Cooking - Healthy Salads and Dressing Clinical staff conducted group or individual video education with verbal and written material and guidebook.  Patient learns that vegetables, fruits, whole grains, and legumes are the foundations of the Pritikin Eating Plan. Recommendations include how to incorporate each of these in flavorful and healthy salads, and how to create homemade salad dressings. Proper handling of ingredients is also covered. Cooking - Soups and [employer] - Soups and Desserts Clinical staff conducted group or individual video education with verbal and written material and guidebook.  Patient learns that Pritikin soups and desserts make for easy, nutritious, and delicious snacks and meal components that are low in sodium, fat, sugar, and calorie density, while high in vitamins, minerals, and filling fiber. Recommendations include simple and healthy ideas for soups and desserts.   Overview  The Pritikin Solution Program Overview Clinical staff conducted group or individual video education with verbal and written material and guidebook.  Patient learns that the results of the Pritikin Program have been documented in more than 100 articles published in peer-reviewed journals, and the benefits include reducing risk factors for (and, in some cases, even reversing) high cholesterol, high blood pressure, type 2 diabetes, obesity, and more! An overview of the three key pillars of the Pritikin Program will be covered: eating well, doing regular exercise, and having a healthy mind-set.  WORKSHOPS  Exercise: Exercise Basics: Building Your Action Plan Clinical staff led group instruction and group discussion with PowerPoint presentation and patient guidebook. To enhance the learning environment the use of posters, models and videos may be added. At the conclusion of  this workshop, patients will comprehend the difference between physical activity and exercise, as well as the benefits of incorporating both, into their routine. Patients will understand the FITT (Frequency, Intensity, Time, and Type) principle and how to use it to build an exercise action plan. In addition, safety concerns and other considerations for exercise and cardiac rehab will be addressed by the presenter. The purpose of this lesson is to promote a comprehensive and effective weekly exercise routine in order to improve patients' overall level of fitness.   Managing Heart Disease: Your Path to a Healthier Heart Clinical staff led group instruction and group discussion with PowerPoint presentation and patient guidebook. To enhance the learning environment the use of posters, models and videos may be added.At the conclusion of this workshop, patients will understand the anatomy and physiology of the heart. Additionally, they will understand how Pritikin's three pillars impact the risk factors, the progression, and the management of heart disease.  The purpose of this lesson is to provide a high-level overview of the heart, heart disease, and how the Pritikin lifestyle positively impacts risk factors.  Exercise Biomechanics Clinical staff led group instruction and group discussion with PowerPoint presentation and patient guidebook. To enhance the learning environment the use of posters, models and videos may be added. Patients will learn how the structural parts of their bodies function and how these functions impact their daily activities, movement, and exercise. Patients will learn how to promote a neutral spine, learn how to manage pain, and identify ways to improve their physical movement in order to promote healthy living. The purpose of this lesson is to expose patients to common physical limitations that impact physical activity. Participants will learn practical ways to adapt and  manage aches and pains, and to minimize their effect on regular exercise. Patients will learn how to maintain good posture while sitting, walking, and lifting.  Balance Training and Fall Prevention  Clinical staff led group instruction and group discussion with PowerPoint presentation and patient guidebook. To enhance the learning environment the use of posters, models and videos may be added. At the conclusion of this workshop, patients will understand the importance of their sensorimotor skills (vision, proprioception, and the vestibular system) in maintaining their ability to balance as they age. Patients will apply a variety of balancing exercises that are appropriate for their current level of function. Patients will understand the common causes for poor balance, possible solutions to these problems, and ways to modify their physical environment in order to minimize their fall risk. The purpose of this lesson is to teach patients about the importance of maintaining balance as they age and ways to minimize their risk of falling.  WORKSHOPS   Nutrition:  Fueling  a Healthy Body Clinical staff led group instruction and group discussion with PowerPoint presentation and patient guidebook. To enhance the learning environment the use of posters, models and videos may be added. Patients will review the foundational principles of the Pritikin Eating Plan and understand what constitutes a serving size in each of the food groups. Patients will also learn Pritikin-friendly foods that are better choices when away from home and review make-ahead meal and snack options. Calorie density will be reviewed and applied to three nutrition priorities: weight maintenance, weight loss, and weight gain. The purpose of this lesson is to reinforce (in a group setting) the key concepts around what patients are recommended to eat and how to apply these guidelines when away from home by planning and selecting Pritikin-friendly  options. Patients will understand how calorie density may be adjusted for different weight management goals.  Mindful Eating  Clinical staff led group instruction and group discussion with PowerPoint presentation and patient guidebook. To enhance the learning environment the use of posters, models and videos may be added. Patients will briefly review the concepts of the Pritikin Eating Plan and the importance of low-calorie dense foods. The concept of mindful eating will be introduced as well as the importance of paying attention to internal hunger signals. Triggers for non-hunger eating and techniques for dealing with triggers will be explored. The purpose of this lesson is to provide patients with the opportunity to review the basic principles of the Pritikin Eating Plan, discuss the value of eating mindfully and how to measure internal cues of hunger and fullness using the Hunger Scale. Patients will also discuss reasons for non-hunger eating and learn strategies to use for controlling emotional eating.  Targeting Your Nutrition Priorities Clinical staff led group instruction and group discussion with PowerPoint presentation and patient guidebook. To enhance the learning environment the use of posters, models and videos may be added. Patients will learn how to determine their genetic susceptibility to disease by reviewing their family history. Patients will gain insight into the importance of diet as part of an overall healthy lifestyle in mitigating the impact of genetics and other environmental insults. The purpose of this lesson is to provide patients with the opportunity to assess their personal nutrition priorities by looking at their family history, their own health history and current risk factors. Patients will also be able to discuss ways of prioritizing and modifying the Pritikin Eating Plan for their highest risk areas  Menu  Clinical staff led group instruction and group discussion with  PowerPoint presentation and patient guidebook. To enhance the learning environment the use of posters, models and videos may be added. Using menus brought in from [employer], or printed from [employer], patients will apply the Pritikin dining out guidelines that were presented in the [employer] video. Patients will also be able to practice these guidelines in a variety of provided scenarios. The purpose of this lesson is to provide patients with the opportunity to practice hands-on learning of the Pritikin Dining Out guidelines with actual menus and practice scenarios.  Label Reading Clinical staff led group instruction and group discussion with PowerPoint presentation and patient guidebook. To enhance the learning environment the use of posters, models and videos may be added. Patients will review and discuss the Pritikin label reading guidelines presented in Pritikin's Label Reading Educational series video. Using fool labels brought in from local grocery stores and markets, patients will apply the label reading guidelines and determine if the packaged food  meet the Pritikin guidelines. The purpose of this lesson is to provide patients with the opportunity to review, discuss, and practice hands-on learning of the Pritikin Label Reading guidelines with actual packaged food labels. Cooking School  Pritikin's [employer] are designed to teach patients ways to prepare quick, simple, and affordable recipes at home. The importance of nutrition's role in chronic disease risk reduction is reflected in its emphasis in the overall Pritikin program. By learning how to prepare essential core Pritikin Eating Plan recipes, patients will increase control over what they eat; be able to customize the flavor of foods without the use of added salt, sugar, or fat; and improve the quality of the food they consume. By learning a set of core recipes which are easily assembled, quickly  prepared, and affordable, patients are more likely to prepare more healthy foods at home. These workshops focus on convenient breakfasts, simple entres, side dishes, and desserts which can be prepared with minimal effort and are consistent with nutrition recommendations for cardiovascular risk reduction. Cooking [employer] are taught by a Armed forces logistics/support/administrative officer (RD) who has been trained by the [employer]. The chef or RD has a clear understanding of the importance of minimizing - if not completely eliminating - added fat, sugar, and sodium in recipes. Throughout the series of Cooking School Workshop sessions, patients will learn about healthy ingredients and efficient methods of cooking to build confidence in their capability to prepare    Cooking School weekly topics:  Adding Flavor- Sodium-Free  Fast and Healthy Breakfasts  Powerhouse Plant-Based Proteins  Satisfying Salads and Dressings  Simple Sides and Sauces  International Cuisine-Spotlight on the [employer] Zones  Delicious Desserts  Savory Soups  [employer] - Meals in a Astronomer Appetizers and Snacks  Comforting Weekend Breakfasts  One-Pot Wonders   Fast Evening Meals  Landscape architect Your Pritikin Plate  WORKSHOPS   Healthy Mindset (Psychosocial):  Focused Goals, Sustainable Changes Clinical staff led group instruction and group discussion with PowerPoint presentation and patient guidebook. To enhance the learning environment the use of posters, models and videos may be added. Patients will be able to apply effective goal setting strategies to establish at least one personal goal, and then take consistent, meaningful action toward that goal. They will learn to identify common barriers to achieving personal goals and develop strategies to overcome them. Patients will also gain an understanding of how our mind-set can impact our ability to achieve goals and the importance of  cultivating a positive and growth-oriented mind-set. The purpose of this lesson is to provide patients with a deeper understanding of how to set and achieve personal goals, as well as the tools and strategies needed to overcome common obstacles which may arise along the way.  From Head to Heart: The Power of a Healthy Outlook  Clinical staff led group instruction and group discussion with PowerPoint presentation and patient guidebook. To enhance the learning environment the use of posters, models and videos may be added. Patients will be able to recognize and describe the impact of emotions and mood on physical health. They will discover the importance of self-care and explore self-care practices which may work for them. Patients will also learn how to utilize the 4 C's to cultivate a healthier outlook and better manage stress and challenges. The purpose of this lesson is to demonstrate to patients how a healthy outlook is an essential part of maintaining good health, especially as they continue  their cardiac rehab journey.  Healthy Sleep for a Healthy Heart Clinical staff led group instruction and group discussion with PowerPoint presentation and patient guidebook. To enhance the learning environment the use of posters, models and videos may be added. At the conclusion of this workshop, patients will be able to demonstrate knowledge of the importance of sleep to overall health, well-being, and quality of life. They will understand the symptoms of, and treatments for, common sleep disorders. Patients will also be able to identify daytime and nighttime behaviors which impact sleep, and they will be able to apply these tools to help manage sleep-related challenges. The purpose of this lesson is to provide patients with a general overview of sleep and outline the importance of quality sleep. Patients will learn about a few of the most common sleep disorders. Patients will also be introduced to the concept of  "sleep hygiene," and discover ways to self-manage certain sleeping problems through simple daily behavior changes. Finally, the workshop will motivate patients by clarifying the links between quality sleep and their goals of heart-healthy living.   Recognizing and Reducing Stress Clinical staff led group instruction and group discussion with PowerPoint presentation and patient guidebook. To enhance the learning environment the use of posters, models and videos may be added. At the conclusion of this workshop, patients will be able to understand the types of stress reactions, differentiate between acute and chronic stress, and recognize the impact that chronic stress has on their health. They will also be able to apply different coping mechanisms, such as reframing negative self-talk. Patients will have the opportunity to practice a variety of stress management techniques, such as deep abdominal breathing, progressive muscle relaxation, and/or guided imagery.  The purpose of this lesson is to educate patients on the role of stress in their lives and to provide healthy techniques for coping with it.  Learning Barriers/Preferences:  Learning Barriers/Preferences - 01/19/23 1311       Learning Barriers/Preferences   Learning Barriers None    Learning Preferences Computer/Internet;Group Instruction;Individual Instruction;Pictoral;Skilled Demonstration;Written Material             Education Topics:  Knowledge Questionnaire Score:  Knowledge Questionnaire Score - 01/19/23 1016       Knowledge Questionnaire Score   Pre Score 18/24             Core Components/Risk Factors/Patient Goals at Admission:  Personal Goals and Risk Factors at Admission - 01/19/23 1012       Core Components/Risk Factors/Patient Goals on Admission    Weight Management Yes    Intervention Weight Management: Develop a combined nutrition and exercise program designed to reach desired caloric intake, while  maintaining appropriate intake of nutrient and fiber, sodium and fats, and appropriate energy expenditure required for the weight goal.;Weight Management: Provide education and appropriate resources to help participant work on and attain dietary goals.    Expected Outcomes Short Term: Continue to assess and modify interventions until short term weight is achieved;Long Term: Adherence to nutrition and physical activity/exercise program aimed toward attainment of established weight goal;Understanding recommendations for meals to include 15-35% energy as protein, 25-35% energy from fat, 35-60% energy from carbohydrates, less than 200mg  of dietary cholesterol, 20-35 gm of total fiber daily;Understanding of distribution of calorie intake throughout the day with the consumption of 4-5 meals/snacks    Diabetes Yes    Intervention Provide education about signs/symptoms and action to take for hypo/hyperglycemia.;Provide education about proper nutrition, including hydration, and aerobic/resistive exercise prescription along with  prescribed medications to achieve blood glucose in normal ranges: Fasting glucose 65-99 mg/dL    Expected Outcomes Short Term: Participant verbalizes understanding of the signs/symptoms and immediate care of hyper/hypoglycemia, proper foot care and importance of medication, aerobic/resistive exercise and nutrition plan for blood glucose control.;Long Term: Attainment of HbA1C < 7%.    Heart Failure Yes    Intervention Provide a combined exercise and nutrition program that is supplemented with education, support and counseling about heart failure. Directed toward relieving symptoms such as shortness of breath, decreased exercise tolerance, and extremity edema.    Expected Outcomes Improve functional capacity of life;Short term: Attendance in program 2-3 days a week with increased exercise capacity. Reported lower sodium intake. Reported increased fruit and vegetable intake. Reports medication  compliance.;Short term: Daily weights obtained and reported for increase. Utilizing diuretic protocols set by physician.;Long term: Adoption of self-care skills and reduction of barriers for early signs and symptoms recognition and intervention leading to self-care maintenance.    Lipids Yes    Intervention Provide education and support for participant on nutrition & aerobic/resistive exercise along with prescribed medications to achieve LDL 70mg , HDL >40mg .    Expected Outcomes Short Term: Participant states understanding of desired cholesterol values and is compliant with medications prescribed. Participant is following exercise prescription and nutrition guidelines.;Long Term: Cholesterol controlled with medications as prescribed, with individualized exercise RX and with personalized nutrition plan. Value goals: LDL < 70mg , HDL > 40 mg.             Core Components/Risk Factors/Patient Goals Review:   Goals and Risk Factor Review     Row Name 01/28/23 1650             Core Components/Risk Factors/Patient Goals Review   Personal Goals Review Weight Management/Obesity;Diabetes;Hypertension;Lipids;Stress       Review [name] started cardiac rehab on 01/28/23 and did well with exercise. Vital signs and CBG's were stable. Sinus rhythm with frequent PVC's       Expected Outcomes [name] will continue to participate in cardiac rehab for exercise, nutrition and lifestyle modifications                Core Components/Risk Factors/Patient Goals at Discharge (Final Review):   Goals and Risk Factor Review - 01/28/23 1650       Core Components/Risk Factors/Patient Goals Review   Personal Goals Review Weight Management/Obesity;Diabetes;Hypertension;Lipids;Stress    Review [name] started cardiac rehab on 01/28/23 and did well with exercise. Vital signs and CBG's were stable. Sinus rhythm with frequent PVC's    Expected Outcomes [name] will continue to participate in cardiac rehab for exercise,  nutrition and lifestyle modifications             ITP Comments:  ITP Comments     Row Name 01/19/23 1011 01/28/23 1646         ITP Comments Dr. Armanda Magic medical director. Introduction to pritikin education/ intensive cardiac rehab. Initial orientation packet reviewed with patient. 30 Day ITP. [name] started cardiac rehab on 01/28/23 and did well with exercise               Comments: ***

## 2023-02-01 NOTE — Progress Notes (Signed)
Cardiac Individual Treatment Plan  Patient Details  Name: [name]. MRN: [id number] Date of Birth: July 21, 1955 Referring Provider:   Flowsheet Row INTENSIVE CARDIAC REHAB ORIENT from 01/19/2023 in [facility]  Referring Provider Dorthula Nettles, MD       Initial Encounter Date:  Flowsheet Row INTENSIVE CARDIAC REHAB ORIENT from 01/19/2023 in [facility]  Date 01/19/23       Visit Diagnosis: Heart failure, chronic systolic (HCC)  Patient's Home Medications on Admission:  Current Outpatient Medications:    aspirin 81 MG chewable tablet, Chew 81 mg by mouth daily., Disp: , Rfl:    b complex vitamins capsule, Take 1 capsule by mouth daily., Disp: , Rfl:    carvedilol (COREG) 6.25 MG tablet, Take 1 tablet (6.25 mg total) by mouth 2 (two) times daily with a meal., Disp: 60 tablet, Rfl: 2   Cholecalciferol (VITAMIN D3) 50 MCG (2000 UT) TABS, Take 2,000 Units by mouth daily., Disp: , Rfl:    diclofenac Sodium (VOLTAREN) 1 % GEL, Apply 2 g topically daily as needed (pain)., Disp: , Rfl:    diphenhydrAMINE (BENADRYL ALLERGY) 25 MG tablet, Take 25 mg by mouth as needed., Disp: , Rfl:    empagliflozin (JARDIANCE) 10 MG TABS tablet, Take 1 tablet (10 mg total) by mouth daily., Disp: 30 tablet, Rfl: 3   loratadine (CLARITIN) 10 MG tablet, Take 10 mg by mouth daily. (Patient not taking: Reported on 01/05/2023), Disp: , Rfl:    magnesium oxide (MAG-OX) 400 (240 Mg) MG tablet, Take 400 mg by mouth at bedtime., Disp: , Rfl:    pantoprazole (PROTONIX) 40 MG tablet, TAKE 1 TABLET (40 MG TOTAL) BY MOUTH DAILY., Disp: 90 tablet, Rfl: 0   rosuvastatin (CRESTOR) 20 MG tablet, Take 1 tablet (20 mg total) by mouth daily., Disp: 90 tablet, Rfl: 3   sacubitril-valsartan (ENTRESTO) 49-51 MG, Take 1 tablet by mouth 2 (two) times daily., Disp: 60 tablet, Rfl: 11   Semaglutide,0.25 or  0.5MG /DOS, (OZEMPIC, 0.25 OR 0.5 MG/DOSE,) 2 MG/1.5ML SOPN, Inject 0.5 mg into the skin once a week. If unavailable, please recommend alternative., Disp: 1.5 mL, Rfl: 3   spironolactone (ALDACTONE) 25 MG tablet, Take 25 mg by mouth daily. 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 LICA, ,, 0-39%LICA, s/p Right CEA 2018   CHF (congestive heart failure) (HCC)    Closed head injury with concussion    x2   Complication of anesthesia    Low blood pressure after colonoscopy   Diabetes mellitus without complication (HCC)    Dizziness    Ejection fraction < 50%    EF 40%, echo 2006, no ICD needed  / EF 40%, echo, 11/2009   Former cigarette smoker    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 Georganna Skeans   Sleep disorder    Dr Shelle Iron, lose weight and consider sleep study later   Sleep disorder    TIA (transient ischemic attack)    by history   Volume overload     Tobacco Use: Social History   Tobacco Use  Smoking Status Former   Current packs/day: 0.00   Types: Cigarettes   Quit date: 05/05/2002   Years since quitting: 20.7   Passive exposure: Never  Smokeless Tobacco Never    Labs: Review Flowsheet  More data exists      Latest Ref Rng & Units 07/23/2021 09/29/2021 10/28/2022 10/29/2022 12/08/2022  Labs for ITP Cardiac and Pulmonary Rehab  Cholestrol 0 - 200 mg/dL - - - - 161   LDL (calc) 0 - 99 mg/dL - - - - 68    HDL-C >09 mg/dL - - - - 34   Trlycerides <150 mg/dL - - - - 604   Hemoglobin A1c 4.8 - 5.6 % 6.3  7.2  6.5  - -  PH, Arterial 7.35 - 7.45 - - 7.344  7.296  7.287  7.316  -  PCO2 arterial 32 - 48 mmHg - - 34.0  41.2  41.5  40.1  -  Bicarbonate 20.0 - 28.0 mmol/L - - 18.7  20.1  19.9  24.4  20.5  -  TCO2 22 - 32 mmol/L - - 20  21  21  26  22   -  Acid-base deficit 0.0 - 2.0 mmol/L - - 6.0  6.0  7.0  2.0  5.0  -  O2 Saturation % - - 96  100  88  59  91  -    Details       Multiple values from one day are sorted in reverse-chronological order         Capillary Blood Glucose: Lab Results  Component Value Date   GLUCAP 106 (H) 01/31/2023   GLUCAP 158 (H) 01/28/2023   GLUCAP 240 (H) 01/28/2023   GLUCAP 180 (H) 11/18/2022   GLUCAP 145 (H) 11/17/2022     Exercise Target Goals: Exercise Program Goal: Individual exercise prescription set using results from initial 6 min walk test and THRR while considering  patient's activity barriers and safety.   Exercise Prescription Goal: Initial exercise prescription builds to 30-45 minutes a day of aerobic activity, 2-3 days per week.  Home exercise guidelines will be given to patient during program as part of exercise prescription that the participant will acknowledge.  Activity Barriers & Risk Stratification:  Activity Barriers & Cardiac Risk Stratification - 01/19/23 1308       Activity Barriers & Cardiac Risk Stratification   Activity Barriers Decreased Ventricular Function;Deconditioning;Joint Problems    Cardiac Risk Stratification High   <5 METs on            6 Minute Walk:  6 Minute Walk     Row Name 01/19/23 1307         6 Minute Walk   Phase Initial     Distance 1440 feet     Walk Time 6 minutes     # of Rest Breaks 0     MPH 2.73     METS 3.01     RPE 9     Perceived Dyspnea  0     VO2 Peak 10.55     Symptoms No  Resting HR 64 bpm     Resting BP 102/62     Resting Oxygen Saturation  97 %      Exercise Oxygen Saturation  during 6 min walk 98 %     Max Ex. HR 91 bpm     Max Ex. BP 124/70     2 Minute Post BP 110/66              Oxygen Initial Assessment:   Oxygen Re-Evaluation:   Oxygen Discharge (Final Oxygen Re-Evaluation):   Initial Exercise Prescription:  Initial Exercise Prescription - 01/19/23 1300       Date of Initial Exercise RX and Referring Provider   Date 01/19/23    Referring Provider Dorthula Nettles, MD    Expected Discharge Date 04/13/23      Recumbant Bike   Level 2    RPM 60    Watts 30    Minutes 15    METs 2.7      Arm Ergometer   Level 1    Watts 20    RPM 50    Minutes 15    METs 2.5      Prescription Details   Frequency (times per week) 3    Duration Progress to 30 minutes of continuous aerobic without signs/symptoms of physical distress      Intensity   THRR 40-80% of Max Heartrate 61-121    Ratings of Perceived Exertion 11-13    Perceived Dyspnea 0-4      Progression   Progression Continue progressive overload as per policy without signs/symptoms or physical distress.      Resistance Training   Training Prescription Yes    Weight 3    Reps 10-15             Perform Capillary Blood Glucose checks as needed.  Exercise Prescription Changes:   Exercise Prescription Changes     Row Name 01/28/23 1022 01/31/23 1024           Response to Exercise   Blood Pressure (Admit) 118/58 98/70      Blood Pressure (Exercise) 130/78 122/60      Blood Pressure (Exit) 112/60 94/60      Heart Rate (Admit) 69 bpm 69 bpm      Heart Rate (Exercise) 83 bpm 86 bpm      Heart Rate (Exit) 69 bpm 63 bpm      Rating of Perceived Exertion (Exercise) 11 11      Symptoms None None      Comments Off to a good start with exercise. Reviewed METs      Duration Continue with 30 min of aerobic exercise without signs/symptoms of physical distress. Continue with 30 min of aerobic exercise without signs/symptoms of physical distress.       Intensity THRR unchanged THRR unchanged        Progression   Progression Continue to progress workloads to maintain intensity without signs/symptoms of physical distress. Continue to progress workloads to maintain intensity without signs/symptoms of physical distress.      Average METs 1.7 1.9        Resistance Training   Training Prescription Yes Yes      Weight 3 3      Reps 10-15 10-15      Time 10 Minutes 10 Minutes        Interval Training   Interval Training No No        Recumbant Bike   Level 2 2  RPM 50 --      Watts 13 --      Minutes 15 15      METs 1.9 2.3        Arm Ergometer   Level 1 1      Watts 9 13      RPM 35 50      Minutes 15 15      METs 1.5 1.6               Exercise Comments:   Exercise Comments     Row Name 01/28/23 1130 01/31/23 1114         Exercise Comments Jamarkis tolerated low intesnity exericse well without symptoms. Introduced patient to exercise routine and warm-up and cool-down stretches. Reviewed METs with patient.               Exercise Goals and Review:   Exercise Goals     Row Name 01/19/23 1012             Exercise Goals   Increase Physical Activity Yes       Intervention Provide advice, education, support and counseling about physical activity/exercise needs.;Develop an individualized exercise prescription for aerobic and resistive training based on initial evaluation findings, risk stratification, comorbidities and participant's personal goals.       Expected Outcomes Short Term: Attend rehab on a regular basis to increase amount of physical activity.;Long Term: Add in home exercise to make exercise part of routine and to increase amount of physical activity.;Long Term: Exercising regularly at least 3-5 days a week.       Increase Strength and Stamina Yes       Intervention Provide advice, education, support and counseling about physical activity/exercise needs.;Develop an individualized exercise  prescription for aerobic and resistive training based on initial evaluation findings, risk stratification, comorbidities and participant's personal goals.       Expected Outcomes Short Term: Increase workloads from initial exercise prescription for resistance, speed, and METs.;Short Term: Perform resistance training exercises routinely during rehab and add in resistance training at home;Long Term: Improve cardiorespiratory fitness, muscular endurance and strength as measured by increased METs and functional capacity ( )       Able to understand and use rate of perceived exertion (RPE) scale Yes       Intervention Provide education and explanation on how to use RPE scale       Expected Outcomes Short Term: Able to use RPE daily in rehab to express subjective intensity level;Long Term:  Able to use RPE to guide intensity level when exercising independently       Knowledge and understanding of Target Heart Rate Range (THRR) Yes       Intervention Provide education and explanation of THRR including how the numbers were predicted and where they are located for reference       Expected Outcomes Short Term: Able to state/look up THRR;Short Term: Able to use daily as guideline for intensity in rehab;Long Term: Able to use THRR to govern intensity when exercising independently       Understanding of Exercise Prescription Yes       Intervention Provide education, explanation, and written materials on patient's individual exercise prescription       Expected Outcomes Short Term: Able to explain program exercise prescription;Long Term: Able to explain home exercise prescription to exercise independently                Exercise Goals Re-Evaluation :  Exercise  Goals Re-Evaluation     Row Name 01/28/23 1130             Exercise Goal Re-Evaluation   Exercise Goals Review Increase Physical Activity;Increase Strength and Stamina;Able to understand and use rate of perceived exertion (RPE) scale        Comments Daichi was able to understand and use RPE scale appropriately.       Expected Outcomes Progress workloads as tolerated to help increase cardiorespiratory fitness.                Discharge Exercise Prescription (Final Exercise Prescription Changes):  Exercise Prescription Changes - 01/31/23 1024       Response to Exercise   Blood Pressure (Admit) 98/70    Blood Pressure (Exercise) 122/60    Blood Pressure (Exit) 94/60    Heart Rate (Admit) 69 bpm    Heart Rate (Exercise) 86 bpm    Heart Rate (Exit) 63 bpm    Rating of Perceived Exertion (Exercise) 11    Symptoms None    Comments Reviewed METs    Duration Continue with 30 min of aerobic exercise without signs/symptoms of physical distress.    Intensity THRR unchanged      Progression   Progression Continue to progress workloads to maintain intensity without signs/symptoms of physical distress.    Average METs 1.9      Resistance Training   Training Prescription Yes    Weight 3    Reps 10-15    Time 10 Minutes      Interval Training   Interval Training No      Recumbant Bike   Level 2    Minutes 15    METs 2.3      Arm Ergometer   Level 1    Watts 13    RPM 50    Minutes 15    METs 1.6             Nutrition:  Target Goals: Understanding of nutrition guidelines, daily intake of sodium 1500mg , cholesterol 200mg , calories 30% from fat and 7% or less from saturated fats, daily to have 5 or more servings of fruits and vegetables.  Biometrics:  Pre Biometrics - 01/19/23 1306       Pre Biometrics   Waist Circumference 48 inches    Hip Circumference 44 inches    Waist to Hip Ratio 1.09 %    Triceps Skinfold 28 mm    % Body Fat 34.5 %    Grip Strength 34 kg    Flexibility 11.25 in    Single Leg Stand 30 seconds              Nutrition Therapy Plan and Nutrition Goals:  Nutrition Therapy & Goals - 01/28/23 1110       Nutrition Therapy   Diet Heart healthy/carbohydrate consistent diet     Drug/Food Interactions Statins/Certain Fruits      Personal Nutrition Goals   Nutrition Goal Patient to identify strategies for reducing cardiovascular risk by attending the [employer] education and nutrition series weekly.    Personal Goal #2 Patient to improve diet quality by using the plate method as a guide for meal planning to include lean protein/plant protein, fruits, vegetables, whole grains, nonfat dairy as part of a well-balanced diet.    Personal Goal #3 Patient to identify food sources and limit daily intake of saturated fat, trans fat, sodium, and refined carbohydrates.    Comments Bud reports that he and his wife  have started making some changes including switched to non-nutrititve sweeteners and increased dietary fiber. He reports taking ozempic to aid with cardiovascular risk and blood sugar control. He reports am/fasting blood sugars ~130s. He continues to work on reducing saturated fat intake. Patient will benefit from participation in intensive cardiac rehab for nutrition, exercise, and lifestyle modification.      Intervention Plan   Intervention Prescribe, educate and counsel regarding individualized specific dietary modifications aiming towards targeted core components such as weight, hypertension, lipid management, diabetes, heart failure and other comorbidities.;Nutrition handout(s) given to patient.    Expected Outcomes Short Term Goal: Understand basic principles of dietary content, such as calories, fat, sodium, cholesterol and nutrients.;Long Term Goal: Adherence to prescribed nutrition plan.             Nutrition Assessments:  MEDIFICTS Score Key: ?70 Need to make dietary changes  40-70 Heart Healthy Diet ? 40 Therapeutic Level Cholesterol Diet    Picture Your Plate Scores: <09 Unhealthy dietary pattern with much room for improvement. 41-50 Dietary pattern unlikely to meet recommendations for good health and room for improvement. 51-60 More healthful dietary  pattern, with some room for improvement.  >60 Healthy dietary pattern, although there may be some specific behaviors that could be improved.    Nutrition Goals Re-Evaluation:  Nutrition Goals Re-Evaluation     Row Name 01/28/23 1110             Goals   Current Weight 223 lb 1.7 oz (101.2 kg)       Comment triglycerides 155, HDL 34, lipoproteinA WNL       Expected Outcome Bud reports that he and his wife have started making some changes including switched to non-nutrititve sweeteners and increased dietary fiber. He reports taking ozempic to aid with cardiovascular risk and blood sugar control. He reports am/fasting blood sugars ~130s. He continues to work on reducing saturated fat intake. Patient will benefit from participation in intensive cardiac rehab for nutrition, exercise, and lifestyle modification.                Nutrition Goals Re-Evaluation:  Nutrition Goals Re-Evaluation     Row Name 01/28/23 1110             Goals   Current Weight 223 lb 1.7 oz (101.2 kg)       Comment triglycerides 155, HDL 34, lipoproteinA WNL       Expected Outcome Bud reports that he and his wife have started making some changes including switched to non-nutrititve sweeteners and increased dietary fiber. He reports taking ozempic to aid with cardiovascular risk and blood sugar control. He reports am/fasting blood sugars ~130s. He continues to work on reducing saturated fat intake. Patient will benefit from participation in intensive cardiac rehab for nutrition, exercise, and lifestyle modification.                Nutrition Goals Discharge (Final Nutrition Goals Re-Evaluation):  Nutrition Goals Re-Evaluation - 01/28/23 1110       Goals   Current Weight 223 lb 1.7 oz (101.2 kg)    Comment triglycerides 155, HDL 34, lipoproteinA WNL    Expected Outcome Bud reports that he and his wife have started making some changes including switched to non-nutrititve sweeteners and increased dietary  fiber. He reports taking ozempic to aid with cardiovascular risk and blood sugar control. He reports am/fasting blood sugars ~130s. He continues to work on reducing saturated fat intake. Patient will benefit from participation in intensive  cardiac rehab for nutrition, exercise, and lifestyle modification.             Psychosocial: Target Goals: Acknowledge presence or absence of significant depression and/or stress, maximize coping skills, provide positive support system. Participant is able to verbalize types and ability to use techniques and skills needed for reducing stress and depression.  Initial Review & Psychosocial Screening:  Initial Psych Review & Screening - 01/19/23 1311       Initial Review   Current issues with None Identified      Family Dynamics   Good Support System? Yes   Bud has his wife for support     Barriers   Psychosocial barriers to participate in program There are no identifiable barriers or psychosocial needs.      Screening Interventions   Interventions Encouraged to exercise;Provide feedback about the scores to participant    Expected Outcomes Short Term goal: Identification and review with participant of any Quality of Life or Depression concerns found by scoring the questionnaire.;Long Term goal: The participant improves quality of Life and PHQ9 Scores as seen by post scores and/or verbalization of changes             Quality of Life Scores:  Quality of Life - 01/19/23 1311       Quality of Life   Select Quality of Life      Quality of Life Scores   Health/Function Pre 24.7 %    Socioeconomic Pre 27.69 %    Psych/Spiritual Pre 27.86 %    Family Pre 30 %    GLOBAL Pre 26.77 %            Scores of 19 and below usually indicate a poorer quality of life in these areas.  A difference of  2-3 points is a clinically meaningful difference.  A difference of 2-3 points in the total score of the Quality of Life Index has been associated with  significant improvement in overall quality of life, self-image, physical symptoms, and general health in studies assessing change in quality of life.  PHQ-9: Review Flowsheet  More data exists      01/19/2023 11/23/2022 07/06/2022 07/03/2021 03/03/2021  Depression screen PHQ 2/9  Decreased Interest 0 0 0 0 0 0  Down, Depressed, Hopeless 0 0 0 0 0 0  PHQ - 2 Score 0 0 0 0 0 0  Altered sleeping 0 0 - - 0  Tired, decreased energy 0 0 - - 0  Change in appetite 0 0 - - 0  Feeling bad or failure about yourself  0 0 - - 0  Trouble concentrating 0 0 - - 0  Moving slowly or fidgety/restless 0 0 - - 0  Suicidal thoughts 0 0 - - 0  PHQ-9 Score 0 0 - - 0  Difficult doing work/chores - Not difficult at all - - -    Details       Multiple values from one day are sorted in reverse-chronological order        Interpretation of Total Score  Total Score Depression Severity:  1-4 = Minimal depression, 5-9 = Mild depression, 10-14 = Moderate depression, 15-19 = Moderately severe depression, 20-27 = Severe depression   Psychosocial Evaluation and Intervention:   Psychosocial Re-Evaluation:  Psychosocial Re-Evaluation     Row Name 01/28/23 1649             Psychosocial Re-Evaluation   Current issues with None Identified  Interventions Encouraged to attend Cardiac Rehabilitation for the exercise       Continue Psychosocial Services  No Follow up required                Psychosocial Discharge (Final Psychosocial Re-Evaluation):  Psychosocial Re-Evaluation - 01/28/23 1649       Psychosocial Re-Evaluation   Current issues with None Identified    Interventions Encouraged to attend Cardiac Rehabilitation for the exercise    Continue Psychosocial Services  No Follow up required             Vocational Rehabilitation: Provide vocational rehab assistance to qualifying candidates.   Vocational Rehab Evaluation & Intervention:   Education: Education Goals: Education classes  will be provided on a weekly basis, covering required topics. Participant will state understanding/return demonstration of topics presented.    Education     Row Name 01/28/23 1300     Education   Cardiac Education Topics [employer]in   Licensed conveyancer Nutrition   Nutrition Vitamins and Minerals   Instruction Review Code 1- Verbalizes Understanding   Class Start Time 1145   Class Stop Time 1230   Class Time Calculation (min) 45 min    Row Name 01/31/23 1100     Education   Cardiac Education Topics [facility]   Geographical information systems officer Psychosocial   Psychosocial Workshop Healthy Sleep for a Healthy Heart   Instruction Review Code 1- Verbalizes Understanding   Class Start Time 1148   Class Stop Time 1235   Class Time Calculation (min) 47 min            Core Videos: Exercise    Move It!  Clinical staff conducted group or individual video education with verbal and written material and guidebook.  Patient learns the recommended [facility]ikin exercise program. Exercise with the goal of living a long, healthy life. Some of the health benefits of exercise include controlled diabetes, healthier blood pressure levels, improved cholesterol levels, improved heart and lung capacity, improved sleep, and better body composition. Everyone should speak with their doctor before starting or changing an exercise routine.  Biomechanical Limitations Clinical staff conducted group or individual video education with verbal and written material and guidebook.  Patient learns how biomechanical limitations can impact exercise and how we can mitigate and possibly overcome limitations to have an impactful and balanced exercise routine.  Body Composition Clinical staff conducted group or individual video education with verbal and written material and guidebook.  Patient learns that body composition (ratio of  muscle mass to fat mass) is a key component to assessing overall fitness, rather than body weight alone. Increased fat mass, especially visceral belly fat, can put [place] at increased risk for metabolic syndrome, type 2 diabetes, heart disease, and even death. It is recommended to combine diet and exercise (cardiovascular and resistance training) to improve your body composition. Seek guidance from your physician and exercise physiologist before implementing an exercise routine.  Exercise Action Plan Clinical staff conducted group or individual video education with verbal and written material and guidebook.  Patient learns the recommended strategies to achieve and enjoy long-term exercise adherence, including variety, self-motivation, self-efficacy, and positive decision making. Benefits of exercise include fitness, good health, weight management, more energy, better sleep, less stress, and overall well-being.  Medical   Heart Disease Risk Reduction Clinical staff conducted group or individual  video education with verbal and written material and guidebook.  Patient learns our heart is our most vital organ as it circulates oxygen, nutrients, white blood cells, and hormones throughout the entire body, and carries waste away. Data supports a plant-based eating plan like the Pritikin Program for its effectiveness in slowing progression of and reversing heart disease. The video provides a number of recommendations to address heart disease.   Metabolic Syndrome and Belly Fat  Clinical staff conducted group or individual video education with verbal and written material and guidebook.  Patient learns what metabolic syndrome is, how it leads to heart disease, and how one can reverse it and keep it from coming back. You have metabolic syndrome if you have 3 of the following 5 criteria: abdominal obesity, high blood pressure, high triglycerides, low HDL cholesterol, and high blood sugar.  Hypertension and Heart  Disease Clinical staff conducted group or individual video education with verbal and written material and guidebook.  Patient learns that high blood pressure, or hypertension, is very common in the [place]. Hypertension is largely due to excessive salt intake, but other important risk factors include being overweight, physical inactivity, drinking too much alcohol, smoking, and not eating enough potassium from fruits and vegetables. High blood pressure is a leading risk factor for heart attack, stroke, congestive heart failure, dementia, kidney failure, and premature death. Long-term effects of excessive salt intake include stiffening of the arteries and thickening of heart muscle and organ damage. Recommendations include ways to reduce hypertension and the risk of heart disease.  Diseases of Our Time - Focusing on Diabetes Clinical staff conducted group or individual video education with verbal and written material and guidebook.  Patient learns why the best way to stop diseases of our time is prevention, through food and other lifestyle changes. Medicine (such as prescription pills and surgeries) is often only a Band-Aid on the problem, not a long-term solution. Most common diseases of our time include obesity, type 2 diabetes, hypertension, heart disease, and cancer. The Pritikin Program is recommended and has been proven to help reduce, reverse, and/or prevent the damaging effects of metabolic syndrome.  Nutrition   Overview of the Pritikin Eating Plan  Clinical staff conducted group or individual video education with verbal and written material and guidebook.  Patient learns about the Pritikin Eating Plan for disease risk reduction. The Pritikin Eating Plan emphasizes a wide variety of unrefined, minimally-processed carbohydrates, like fruits, vegetables, whole grains, and legumes. Go, Caution, and Stop food choices are explained. Plant-based and lean animal proteins are emphasized. Rationale  provided for low sodium intake for blood pressure control, low added sugars for blood sugar stabilization, and low added fats and oils for coronary artery disease risk reduction and weight management.  Calorie Density  Clinical staff conducted group or individual video education with verbal and written material and guidebook.  Patient learns about calorie density and how it impacts the Pritikin Eating Plan. Knowing the characteristics of the food you choose will help you decide whether those foods will lead to weight gain or weight loss, and whether you want to consume more or less of them. Weight loss is usually a side effect of the Pritikin Eating Plan because of its focus on low calorie-dense foods.  Label Reading  Clinical staff conducted group or individual video education with verbal and written material and guidebook.  Patient learns about the Pritikin recommended label reading guidelines and corresponding recommendations regarding calorie density, added sugars, sodium content, and whole grains.  Dining Out - Part 1  Clinical staff conducted group or individual video education with verbal and written material and guidebook.  Patient learns that restaurant meals can be sabotaging because they can be so high in calories, fat, sodium, and/or sugar. Patient learns recommended strategies on how to positively address this and avoid unhealthy pitfalls.  Facts on Fats  Clinical staff conducted group or individual video education with verbal and written material and guidebook.  Patient learns that lifestyle modifications can be just as effective, if not more so, as many medications for lowering your risk of heart disease. A Pritikin lifestyle can help to reduce your risk of inflammation and atherosclerosis (cholesterol build-up, or plaque, in the artery walls). Lifestyle interventions such as dietary choices and physical activity address the cause of atherosclerosis. A review of the types of fats and  their impact on blood cholesterol levels, along with dietary recommendations to reduce fat intake is also included.  Nutrition Action Plan  Clinical staff conducted group or individual video education with verbal and written material and guidebook.  Patient learns how to incorporate Pritikin recommendations into their lifestyle. Recommendations include planning and keeping personal health goals in mind as an important part of their success.  Healthy Mind-Set    Healthy Minds, Bodies, Hearts  Clinical staff conducted group or individual video education with verbal and written material and guidebook.  Patient learns how to identify when they are stressed. Video will discuss the impact of that stress, as well as the many benefits of stress management. Patient will also be introduced to stress management techniques. The way we think, act, and feel has an impact on our hearts.  How Our Thoughts Can Heal Our Hearts  Clinical staff conducted group or individual video education with verbal and written material and guidebook.  Patient learns that negative thoughts can cause depression and anxiety. This can result in negative lifestyle behavior and serious health problems. Cognitive behavioral therapy is an effective method to help control our thoughts in order to change and improve our emotional outlook.  Additional Videos:  Exercise    Improving Performance  Clinical staff conducted group or individual video education with verbal and written material and guidebook.  Patient learns to use a non-linear approach by alternating intensity levels and lengths of time spent exercising to help burn more calories and lose more body fat. Cardiovascular exercise helps improve heart health, metabolism, hormonal balance, blood sugar control, and recovery from fatigue. Resistance training improves strength, endurance, balance, coordination, reaction time, metabolism, and muscle mass. Flexibility exercise improves  circulation, posture, and balance. Seek guidance from your physician and exercise physiologist before implementing an exercise routine and learn your capabilities and proper form for all exercise.  Introduction to Yoga  Clinical staff conducted group or individual video education with verbal and written material and guidebook.  Patient learns about yoga, a discipline of the coming together of mind, breath, and body. The benefits of yoga include improved flexibility, improved range of motion, better posture and core strength, increased lung function, weight loss, and positive self-image. Yoga's heart health benefits include lowered blood pressure, healthier heart rate, decreased cholesterol and triglyceride levels, improved immune function, and reduced stress. Seek guidance from your physician and exercise physiologist before implementing an exercise routine and learn your capabilities and proper form for all exercise.  Medical   Aging: Enhancing Your Quality of Life  Clinical staff conducted group or individual video education with verbal and written material and guidebook.  Patient learns  key strategies and recommendations to stay in good physical health and enhance quality of life, such as prevention strategies, having an advocate, securing a Health Care Proxy and Power of Attorney, and keeping a list of medications and system for tracking them. It also discusses how to avoid risk for bone loss.  Biology of Weight Control  Clinical staff conducted group or individual video education with verbal and written material and guidebook.  Patient learns that weight gain occurs because we consume more calories than we burn (eating more, moving less). Even if your body weight is normal, you may have higher ratios of fat compared to muscle mass. Too much body fat puts you at increased risk for cardiovascular disease, heart attack, stroke, type 2 diabetes, and obesity-related cancers. In addition to exercise,  following the Pritikin Eating Plan can help reduce your risk.  Decoding Lab Results  Clinical staff conducted group or individual video education with verbal and written material and guidebook.  Patient learns that lab test reflects one measurement whose values change over time and are influenced by many factors, including medication, stress, sleep, exercise, food, hydration, pre-existing medical conditions, and more. It is recommended to use the knowledge from this video to become more involved with your lab results and evaluate your numbers to speak with your doctor.   Diseases of Our Time - Overview  Clinical staff conducted group or individual video education with verbal and written material and guidebook.  Patient learns that according to the CDC, 50% to 70% of chronic diseases (such as obesity, type 2 diabetes, elevated lipids, hypertension, and heart disease) are avoidable through lifestyle improvements including healthier food choices, listening to satiety cues, and increased physical activity.  Sleep Disorders Clinical staff conducted group or individual video education with verbal and written material and guidebook.  Patient learns how good quality and duration of sleep are important to overall health and well-being. Patient also learns about sleep disorders and how they impact health along with recommendations to address them, including discussing with a physician.  Nutrition  Dining Out - Part 2 Clinical staff conducted group or individual video education with verbal and written material and guidebook.  Patient learns how to plan ahead and communicate in order to maximize their dining experience in a healthy and nutritious manner. Included are recommended food choices based on the type of restaurant the patient is visiting.   Fueling a Banker conducted group or individual video education with verbal and written material and guidebook.  There is a strong  connection between our food choices and our health. Diseases like obesity and type 2 diabetes are very prevalent and are in large-part due to lifestyle choices. The Pritikin Eating Plan provides plenty of food and hunger-curbing satisfaction. It is easy to follow, affordable, and helps reduce health risks.  Menu Workshop  Clinical staff conducted group or individual video education with verbal and written material and guidebook.  Patient learns that restaurant meals can sabotage health goals because they are often packed with calories, fat, sodium, and sugar. Recommendations include strategies to plan ahead and to communicate with the manager, chef, or server to help order a healthier meal.  Planning Your Eating Strategy  Clinical staff conducted group or individual video education with verbal and written material and guidebook.  Patient learns about the Pritikin Eating Plan and its benefit of reducing the risk of disease. The Pritikin Eating Plan does not focus on calories. Instead, it emphasizes high-quality, nutrient-rich foods. By  knowing the characteristics of the foods, we choose, we can determine their calorie density and make informed decisions.  Targeting Your Nutrition Priorities  Clinical staff conducted group or individual video education with verbal and written material and guidebook.  Patient learns that lifestyle habits have a tremendous impact on disease risk and progression. This video provides eating and physical activity recommendations based on your personal health goals, such as reducing LDL cholesterol, losing weight, preventing or controlling type 2 diabetes, and reducing high blood pressure.  Vitamins and Minerals  Clinical staff conducted group or individual video education with verbal and written material and guidebook.  Patient learns different ways to obtain key vitamins and minerals, including through a recommended healthy diet. It is important to discuss all supplements  you take with your doctor.   Healthy Mind-Set    Smoking Cessation  Clinical staff conducted group or individual video education with verbal and written material and guidebook.  Patient learns that cigarette smoking and tobacco addiction pose a serious health risk which affects millions of people. Stopping smoking will significantly reduce the risk of heart disease, lung disease, and many forms of cancer. Recommended strategies for quitting are covered, including working with your doctor to develop a successful plan.  Culinary   Becoming a Set designer conducted group or individual video education with verbal and written material and guidebook.  Patient learns that cooking at home can be healthy, cost-effective, quick, and puts them in control. Keys to cooking healthy recipes will include looking at your recipe, assessing your equipment needs, planning ahead, making it simple, choosing cost-effective seasonal ingredients, and limiting the use of added fats, salts, and sugars.  Cooking - Breakfast and Snacks  Clinical staff conducted group or individual video education with verbal and written material and guidebook.  Patient learns how important breakfast is to satiety and nutrition through the entire day. Recommendations include key foods to eat during breakfast to help stabilize blood sugar levels and to prevent overeating at meals later in the day. Planning ahead is also a key component.  Cooking - Educational psychologist conducted group or individual video education with verbal and written material and guidebook.  Patient learns eating strategies to improve overall health, including an approach to cook more at home. Recommendations include thinking of animal protein as a side on your plate rather than center stage and focusing instead on lower calorie dense options like vegetables, fruits, whole grains, and plant-based proteins, such as beans. Making sauces in large  quantities to freeze for later and leaving the skin on your vegetables are also recommended to maximize your experience.  Cooking - Healthy Salads and Dressing Clinical staff conducted group or individual video education with verbal and written material and guidebook.  Patient learns that vegetables, fruits, whole grains, and legumes are the foundations of the Pritikin Eating Plan. Recommendations include how to incorporate each of these in flavorful and healthy salads, and how to create homemade salad dressings. Proper handling of ingredients is also covered. Cooking - Soups and [employer] - Soups and Desserts Clinical staff conducted group or individual video education with verbal and written material and guidebook.  Patient learns that Pritikin soups and desserts make for easy, nutritious, and delicious snacks and meal components that are low in sodium, fat, sugar, and calorie density, while high in vitamins, minerals, and filling fiber. Recommendations include simple and healthy ideas for soups and desserts.   Overview  The Pritikin Solution Program Overview Clinical staff conducted group or individual video education with verbal and written material and guidebook.  Patient learns that the results of the Pritikin Program have been documented in more than 100 articles published in peer-reviewed journals, and the benefits include reducing risk factors for (and, in some cases, even reversing) high cholesterol, high blood pressure, type 2 diabetes, obesity, and more! An overview of the three key pillars of the Pritikin Program will be covered: eating well, doing regular exercise, and having a healthy mind-set.  WORKSHOPS  Exercise: Exercise Basics: Building Your Action Plan Clinical staff led group instruction and group discussion with PowerPoint presentation and patient guidebook. To enhance the learning environment the use of posters, models and videos may be added. At the conclusion of  this workshop, patients will comprehend the difference between physical activity and exercise, as well as the benefits of incorporating both, into their routine. Patients will understand the FITT (Frequency, Intensity, Time, and Type) principle and how to use it to build an exercise action plan. In addition, safety concerns and other considerations for exercise and cardiac rehab will be addressed by the presenter. The purpose of this lesson is to promote a comprehensive and effective weekly exercise routine in order to improve patients' overall level of fitness.   Managing Heart Disease: Your Path to a Healthier Heart Clinical staff led group instruction and group discussion with PowerPoint presentation and patient guidebook. To enhance the learning environment the use of posters, models and videos may be added.At the conclusion of this workshop, patients will understand the anatomy and physiology of the heart. Additionally, they will understand how Pritikin's three pillars impact the risk factors, the progression, and the management of heart disease.  The purpose of this lesson is to provide a high-level overview of the heart, heart disease, and how the Pritikin lifestyle positively impacts risk factors.  Exercise Biomechanics Clinical staff led group instruction and group discussion with PowerPoint presentation and patient guidebook. To enhance the learning environment the use of posters, models and videos may be added. Patients will learn how the structural parts of their bodies function and how these functions impact their daily activities, movement, and exercise. Patients will learn how to promote a neutral spine, learn how to manage pain, and identify ways to improve their physical movement in order to promote healthy living. The purpose of this lesson is to expose patients to common physical limitations that impact physical activity. Participants will learn practical ways to adapt and  manage aches and pains, and to minimize their effect on regular exercise. Patients will learn how to maintain good posture while sitting, walking, and lifting.  Balance Training and Fall Prevention  Clinical staff led group instruction and group discussion with PowerPoint presentation and patient guidebook. To enhance the learning environment the use of posters, models and videos may be added. At the conclusion of this workshop, patients will understand the importance of their sensorimotor skills (vision, proprioception, and the vestibular system) in maintaining their ability to balance as they age. Patients will apply a variety of balancing exercises that are appropriate for their current level of function. Patients will understand the common causes for poor balance, possible solutions to these problems, and ways to modify their physical environment in order to minimize their fall risk. The purpose of this lesson is to teach patients about the importance of maintaining balance as they age and ways to minimize their risk of falling.  WORKSHOPS   Nutrition:  Fueling  a Healthy Body Clinical staff led group instruction and group discussion with PowerPoint presentation and patient guidebook. To enhance the learning environment the use of posters, models and videos may be added. Patients will review the foundational principles of the Pritikin Eating Plan and understand what constitutes a serving size in each of the food groups. Patients will also learn Pritikin-friendly foods that are better choices when away from home and review make-ahead meal and snack options. Calorie density will be reviewed and applied to three nutrition priorities: weight maintenance, weight loss, and weight gain. The purpose of this lesson is to reinforce (in a group setting) the key concepts around what patients are recommended to eat and how to apply these guidelines when away from home by planning and selecting Pritikin-friendly  options. Patients will understand how calorie density may be adjusted for different weight management goals.  Mindful Eating  Clinical staff led group instruction and group discussion with PowerPoint presentation and patient guidebook. To enhance the learning environment the use of posters, models and videos may be added. Patients will briefly review the concepts of the Pritikin Eating Plan and the importance of low-calorie dense foods. The concept of mindful eating will be introduced as well as the importance of paying attention to internal hunger signals. Triggers for non-hunger eating and techniques for dealing with triggers will be explored. The purpose of this lesson is to provide patients with the opportunity to review the basic principles of the Pritikin Eating Plan, discuss the value of eating mindfully and how to measure internal cues of hunger and fullness using the Hunger Scale. Patients will also discuss reasons for non-hunger eating and learn strategies to use for controlling emotional eating.  Targeting Your Nutrition Priorities Clinical staff led group instruction and group discussion with PowerPoint presentation and patient guidebook. To enhance the learning environment the use of posters, models and videos may be added. Patients will learn how to determine their genetic susceptibility to disease by reviewing their family history. Patients will gain insight into the importance of diet as part of an overall healthy lifestyle in mitigating the impact of genetics and other environmental insults. The purpose of this lesson is to provide patients with the opportunity to assess their personal nutrition priorities by looking at their family history, their own health history and current risk factors. Patients will also be able to discuss ways of prioritizing and modifying the Pritikin Eating Plan for their highest risk areas  Menu  Clinical staff led group instruction and group discussion with  PowerPoint presentation and patient guidebook. To enhance the learning environment the use of posters, models and videos may be added. Using menus brought in from [employer], or printed from [employer], patients will apply the Pritikin dining out guidelines that were presented in the [employer] video. Patients will also be able to practice these guidelines in a variety of provided scenarios. The purpose of this lesson is to provide patients with the opportunity to practice hands-on learning of the Pritikin Dining Out guidelines with actual menus and practice scenarios.  Label Reading Clinical staff led group instruction and group discussion with PowerPoint presentation and patient guidebook. To enhance the learning environment the use of posters, models and videos may be added. Patients will review and discuss the Pritikin label reading guidelines presented in Pritikin's Label Reading Educational series video. Using fool labels brought in from local grocery stores and markets, patients will apply the label reading guidelines and determine if the packaged food  meet the Pritikin guidelines. The purpose of this lesson is to provide patients with the opportunity to review, discuss, and practice hands-on learning of the Pritikin Label Reading guidelines with actual packaged food labels. Cooking School  Pritikin's [employer] are designed to teach patients ways to prepare quick, simple, and affordable recipes at home. The importance of nutrition's role in chronic disease risk reduction is reflected in its emphasis in the overall Pritikin program. By learning how to prepare essential core Pritikin Eating Plan recipes, patients will increase control over what they eat; be able to customize the flavor of foods without the use of added salt, sugar, or fat; and improve the quality of the food they consume. By learning a set of core recipes which are easily assembled, quickly  prepared, and affordable, patients are more likely to prepare more healthy foods at home. These workshops focus on convenient breakfasts, simple entres, side dishes, and desserts which can be prepared with minimal effort and are consistent with nutrition recommendations for cardiovascular risk reduction. Cooking [employer] are taught by a Armed forces logistics/support/administrative officer (RD) who has been trained by the [employer]. The chef or RD has a clear understanding of the importance of minimizing - if not completely eliminating - added fat, sugar, and sodium in recipes. Throughout the series of Cooking School Workshop sessions, patients will learn about healthy ingredients and efficient methods of cooking to build confidence in their capability to prepare    Cooking School weekly topics:  Adding Flavor- Sodium-Free  Fast and Healthy Breakfasts  Powerhouse Plant-Based Proteins  Satisfying Salads and Dressings  Simple Sides and Sauces  International Cuisine-Spotlight on the [employer] Zones  Delicious Desserts  Savory Soups  [employer] - Meals in a Astronomer Appetizers and Snacks  Comforting Weekend Breakfasts  One-Pot Wonders   Fast Evening Meals  Landscape architect Your Pritikin Plate  WORKSHOPS   Healthy Mindset (Psychosocial):  Focused Goals, Sustainable Changes Clinical staff led group instruction and group discussion with PowerPoint presentation and patient guidebook. To enhance the learning environment the use of posters, models and videos may be added. Patients will be able to apply effective goal setting strategies to establish at least one personal goal, and then take consistent, meaningful action toward that goal. They will learn to identify common barriers to achieving personal goals and develop strategies to overcome them. Patients will also gain an understanding of how our mind-set can impact our ability to achieve goals and the importance of  cultivating a positive and growth-oriented mind-set. The purpose of this lesson is to provide patients with a deeper understanding of how to set and achieve personal goals, as well as the tools and strategies needed to overcome common obstacles which may arise along the way.  From Head to Heart: The Power of a Healthy Outlook  Clinical staff led group instruction and group discussion with PowerPoint presentation and patient guidebook. To enhance the learning environment the use of posters, models and videos may be added. Patients will be able to recognize and describe the impact of emotions and mood on physical health. They will discover the importance of self-care and explore self-care practices which may work for them. Patients will also learn how to utilize the 4 C's to cultivate a healthier outlook and better manage stress and challenges. The purpose of this lesson is to demonstrate to patients how a healthy outlook is an essential part of maintaining good health, especially as they continue  their cardiac rehab journey.  Healthy Sleep for a Healthy Heart Clinical staff led group instruction and group discussion with PowerPoint presentation and patient guidebook. To enhance the learning environment the use of posters, models and videos may be added. At the conclusion of this workshop, patients will be able to demonstrate knowledge of the importance of sleep to overall health, well-being, and quality of life. They will understand the symptoms of, and treatments for, common sleep disorders. Patients will also be able to identify daytime and nighttime behaviors which impact sleep, and they will be able to apply these tools to help manage sleep-related challenges. The purpose of this lesson is to provide patients with a general overview of sleep and outline the importance of quality sleep. Patients will learn about a few of the most common sleep disorders. Patients will also be introduced to the concept of  "sleep hygiene," and discover ways to self-manage certain sleeping problems through simple daily behavior changes. Finally, the workshop will motivate patients by clarifying the links between quality sleep and their goals of heart-healthy living.   Recognizing and Reducing Stress Clinical staff led group instruction and group discussion with PowerPoint presentation and patient guidebook. To enhance the learning environment the use of posters, models and videos may be added. At the conclusion of this workshop, patients will be able to understand the types of stress reactions, differentiate between acute and chronic stress, and recognize the impact that chronic stress has on their health. They will also be able to apply different coping mechanisms, such as reframing negative self-talk. Patients will have the opportunity to practice a variety of stress management techniques, such as deep abdominal breathing, progressive muscle relaxation, and/or guided imagery.  The purpose of this lesson is to educate patients on the role of stress in their lives and to provide healthy techniques for coping with it.  Learning Barriers/Preferences:  Learning Barriers/Preferences - 01/19/23 1311       Learning Barriers/Preferences   Learning Barriers None    Learning Preferences Computer/Internet;Group Instruction;Individual Instruction;Pictoral;Skilled Demonstration;Written Material             Education Topics:  Knowledge Questionnaire Score:  Knowledge Questionnaire Score - 01/19/23 1016       Knowledge Questionnaire Score   Pre Score 18/24             Core Components/Risk Factors/Patient Goals at Admission:  Personal Goals and Risk Factors at Admission - 01/19/23 1012       Core Components/Risk Factors/Patient Goals on Admission    Weight Management Yes    Intervention Weight Management: Develop a combined nutrition and exercise program designed to reach desired caloric intake, while  maintaining appropriate intake of nutrient and fiber, sodium and fats, and appropriate energy expenditure required for the weight goal.;Weight Management: Provide education and appropriate resources to help participant work on and attain dietary goals.    Expected Outcomes Short Term: Continue to assess and modify interventions until short term weight is achieved;Long Term: Adherence to nutrition and physical activity/exercise program aimed toward attainment of established weight goal;Understanding recommendations for meals to include 15-35% energy as protein, 25-35% energy from fat, 35-60% energy from carbohydrates, less than 200mg  of dietary cholesterol, 20-35 gm of total fiber daily;Understanding of distribution of calorie intake throughout the day with the consumption of 4-5 meals/snacks    Diabetes Yes    Intervention Provide education about signs/symptoms and action to take for hypo/hyperglycemia.;Provide education about proper nutrition, including hydration, and aerobic/resistive exercise prescription along with  prescribed medications to achieve blood glucose in normal ranges: Fasting glucose 65-99 mg/dL    Expected Outcomes Short Term: Participant verbalizes understanding of the signs/symptoms and immediate care of hyper/hypoglycemia, proper foot care and importance of medication, aerobic/resistive exercise and nutrition plan for blood glucose control.;Long Term: Attainment of HbA1C < 7%.    Heart Failure Yes    Intervention Provide a combined exercise and nutrition program that is supplemented with education, support and counseling about heart failure. Directed toward relieving symptoms such as shortness of breath, decreased exercise tolerance, and extremity edema.    Expected Outcomes Improve functional capacity of life;Short term: Attendance in program 2-3 days a week with increased exercise capacity. Reported lower sodium intake. Reported increased fruit and vegetable intake. Reports medication  compliance.;Short term: Daily weights obtained and reported for increase. Utilizing diuretic protocols set by physician.;Long term: Adoption of self-care skills and reduction of barriers for early signs and symptoms recognition and intervention leading to self-care maintenance.    Lipids Yes    Intervention Provide education and support for participant on nutrition & aerobic/resistive exercise along with prescribed medications to achieve LDL 70mg , HDL >40mg .    Expected Outcomes Short Term: Participant states understanding of desired cholesterol values and is compliant with medications prescribed. Participant is following exercise prescription and nutrition guidelines.;Long Term: Cholesterol controlled with medications as prescribed, with individualized exercise RX and with personalized nutrition plan. Value goals: LDL < 70mg , HDL > 40 mg.             Core Components/Risk Factors/Patient Goals Review:   Goals and Risk Factor Review     Row Name 01/28/23 1650 02/01/23 1434           Core Components/Risk Factors/Patient Goals Review   Personal Goals Review Weight Management/Obesity;Diabetes;Hypertension;Lipids;Stress Weight Management/Obesity;Diabetes;Hypertension;Lipids;Stress      Review [place] started cardiac rehab on 01/28/23 and did well with exercise. Vital signs and CBG's were stable. Sinus rhythm with frequent PVC's Hunter started cardiac rehab on 01/28/23 and is off to a good start to exercise. Vital signs and CBG's have been  stable.      Expected Outcomes [place] will continue to participate in cardiac rehab for exercise, nutrition and lifestyle modifications Fernie will continue to participate in cardiac rehab for exercise, nutrition and lifestyle modifications               Core Components/Risk Factors/Patient Goals at Discharge (Final Review):   Goals and Risk Factor Review - 02/01/23 1434       Core Components/Risk Factors/Patient Goals Review   Personal Goals Review  Weight Management/Obesity;Diabetes;Hypertension;Lipids;Stress    Review [place]ell started cardiac rehab on 01/28/23 and is off to a good start to exercise. Vital signs and CBG's have been  stable.    Expected Outcomes [place] will continue to participate in cardiac rehab for exercise, nutrition and lifestyle modifications             ITP Comments:  ITP Comments     Row Name 01/19/23 1011 01/28/23 1646 02/01/23 1433       ITP Comments Dr. Armanda Magic medical director. Introduction to pritikin education/ intensive cardiac rehab. Initial orientation packet reviewed with patient. 30 Day ITP. Bud started cardiac rehab on 01/28/23 and did well with exercise 30 Day ITP. Bud started cardiac rehab on 01/28/23 and is off to a good start to exercise              Comments: See ITP Comments

## 2023-02-02 ENCOUNTER — Encounter (HOSPITAL_COMMUNITY)
Admission: RE | Admit: 2023-02-02 | Discharge: 2023-02-02 | Disposition: A | Discharge status: Home / Self Care | Payer: Medicare Other | Source: Ambulatory Visit | Attending: Cardiology | Admitting: Cardiology

## 2023-02-02 DIAGNOSIS — I5022 Chronic systolic (congestive) heart failure: Secondary | ICD-10-CM | POA: Diagnosis not present

## 2023-02-03 ENCOUNTER — Ambulatory Visit (INDEPENDENT_AMBULATORY_CARE_PROVIDER_SITE_OTHER): Payer: No Typology Code available for payment source

## 2023-02-03 DIAGNOSIS — G459 Transient cerebral ischemic attack, unspecified: Secondary | ICD-10-CM | POA: Diagnosis not present

## 2023-02-04 ENCOUNTER — Encounter (HOSPITAL_COMMUNITY)
Admission: RE | Admit: 2023-02-04 | Discharge: 2023-02-04 | Disposition: A | Discharge status: Home / Self Care | Payer: Medicare Other | Source: Ambulatory Visit | Attending: Cardiology | Admitting: Cardiology

## 2023-02-04 DIAGNOSIS — I5022 Chronic systolic (congestive) heart failure: Secondary | ICD-10-CM | POA: Diagnosis present

## 2023-02-04 LAB — GLUCOSE, CAPILLARY: Glucose-Capillary: 138 mg/dL — ABNORMAL HIGH (ref 70–99)

## 2023-02-07 ENCOUNTER — Encounter (HOSPITAL_COMMUNITY): Admission: RE | Admit: 2023-02-07 | Payer: Medicare Other | Source: Ambulatory Visit

## 2023-02-07 ENCOUNTER — Encounter (HOSPITAL_COMMUNITY): Payer: No Typology Code available for payment source

## 2023-02-07 DIAGNOSIS — I5022 Chronic systolic (congestive) heart failure: Secondary | ICD-10-CM | POA: Diagnosis not present

## 2023-02-07 NOTE — Progress Notes (Signed)
Reviewed home exercise guidelines with [name] including endpoints, temperature precautions, target heart rate and rate of perceived exertion. He plans to walk as his mode of home exercise. [name] voices understanding of instructions given.  Artist Pais, MS, ACSM CEP

## 2023-02-08 ENCOUNTER — Ambulatory Visit: Payer: No Typology Code available for payment source | Attending: Cardiology | Admitting: Cardiology

## 2023-02-08 ENCOUNTER — Encounter: Payer: Self-pay | Admitting: Cardiology

## 2023-02-08 VITALS — BP 114/68 | HR 60 | Ht 72.0 in | Wt 220.0 lb

## 2023-02-08 DIAGNOSIS — Z95810 Presence of automatic (implantable) cardiac defibrillator: Secondary | ICD-10-CM | POA: Diagnosis not present

## 2023-02-08 DIAGNOSIS — I493 Ventricular premature depolarization: Secondary | ICD-10-CM | POA: Diagnosis not present

## 2023-02-08 DIAGNOSIS — I2510 Atherosclerotic heart disease of native coronary artery without angina pectoris: Secondary | ICD-10-CM | POA: Diagnosis not present

## 2023-02-08 DIAGNOSIS — I4901 Ventricular fibrillation: Secondary | ICD-10-CM

## 2023-02-08 DIAGNOSIS — I5022 Chronic systolic (congestive) heart failure: Secondary | ICD-10-CM | POA: Diagnosis not present

## 2023-02-08 DIAGNOSIS — I469 Cardiac arrest, cause unspecified: Secondary | ICD-10-CM

## 2023-02-08 LAB — CUP PACEART INCLINIC DEVICE CHECK
Date Time Interrogation Session: 20240806120431
Implantable Lead Connection Status: 753985
Implantable Lead Implant Date: 20240501
Implantable Lead Location: 753860
Implantable Pulse Generator Implant Date: 20240501

## 2023-02-08 NOTE — Progress Notes (Signed)
Electrophysiology Office Note:   Date:  02/08/2023  ID:  [name]., DOB Jan 02, 1956, MRN [id number]  Primary Cardiologist: Kristeen Miss, MD Electrophysiologist: Regan Lemming, MD      History of Present Illness:   [name]. is a 67 y.o. male with h/o systolic heart failure, coronary artery disease, history of VF arrest seen today for routine electrophysiology followup.  Since last being seen in our clinic the patient reports doing well.  He has noted no further episodes of palpitations.  He is going to cardiac rehab and has been enjoying getting exercise.  He has no major complaints at this time.  He is happy with his control..  he denies chest pain, palpitations, dyspnea, PND, orthopnea, nausea, vomiting, dizziness, syncope, edema, weight gain, or early satiety.      CAD status post CABG x 4 in July 2003, chronic systolic heart failure, carotid artery disease status post right CEA and patch angioplasty in May 2018, PAD status post right iliofemoral endarterectomy and stenting of bilateral common iliac arteries in October 2022, hypertension, hyperlipidemia, type 2 diabetes, history of TIA and VT arrest.  Hospitalized in April 2024 after he suffered VT arrest during sedation/intubation for planned right transcarotid artery revascularization. Echocardiogram shortly after with LVEF less than 20% and moderately reduced RV function. He underwent right and left heart catheterization that showed total occlusion of the proximal LAD, CTO of the distal circumflex, subtotal occlusion of the proximal and mid circumflex and CTO of the RCA with patency of the RIMA to LAD and free LIMA to diagonal; occlusion of SVG to OM 2 and SVG to PDA. Cardiac index at that time of 2.1. He had a follow-up cardiac MRI with LVEF of 23% and underwent secondary mention ICD.       Review of systems complete and found to be negative unless listed in HPI.      EP Information / Studies Reviewed:    EKG is  ordered today. Personal review as below.      ICD Interrogation-  reviewed in detail today,  See PACE[facility] report.  Device History: Medtronic Single Chamber ICD implanted 11/03/22 for neck systolic heart failure, VF arrest History of appropriate therapy: No History of AAD therapy: Yes; previously tolerated amiodarone    Risk Assessment/Calculations:              Physical Exam:   VS:  BP 114/68 (BP Location: Left Arm, Patient Position: Sitting, Cuff Size: Large)   Pulse 60   Ht 6' (1.829 m)   Wt 220 lb (99.8 kg)   SpO2 94%   BMI 29.84 kg/m    Wt Readings from Last 3 Encounters:  02/08/23 220 lb (99.8 kg)  01/19/23 223 lb 1.7 oz (101.2 kg)  01/05/23 219 lb 12.8 oz (99.7 kg)     GEN: Well nourished, well developed in no acute distress NECK: No JVD; No carotid bruits CARDIAC: Regular rate and rhythm, no murmurs, rubs, gallops RESPIRATORY:  Clear to auscultation without rales, wheezing or rhonchi  ABDOMEN: Soft, non-tender, non-distended EXTREMITIES:  No edema; No deformity   ASSESSMENT AND PLAN:    1.  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 No further episodes of ventricular fibrillation.  2.  Coronary artery disease: Status post CABG.  Severe native  disease.  Plan per primary cardiology.  3.  Peripheral arterial disease: Plan per primary cardiology  4.  Hyperlipidemia: Continue management per primary cardiology  Disposition:   Follow up with EP APP in 12 months   Signed, Omeed Osuna Jorja Loa, MD

## 2023-02-09 ENCOUNTER — Encounter (HOSPITAL_COMMUNITY)
Admission: RE | Admit: 2023-02-09 | Discharge: 2023-02-09 | Disposition: A | Discharge status: Home / Self Care | Payer: Medicare Other | Source: Ambulatory Visit | Attending: Cardiology | Admitting: Cardiology

## 2023-02-09 DIAGNOSIS — I5022 Chronic systolic (congestive) heart failure: Secondary | ICD-10-CM

## 2023-02-11 ENCOUNTER — Encounter (HOSPITAL_COMMUNITY): Admission: RE | Admit: 2023-02-11 | Payer: Medicare Other | Source: Ambulatory Visit

## 2023-02-14 ENCOUNTER — Encounter (HOSPITAL_COMMUNITY): Payer: Medicare Other

## 2023-02-16 ENCOUNTER — Encounter (HOSPITAL_COMMUNITY)
Admission: RE | Admit: 2023-02-16 | Discharge: 2023-02-16 | Disposition: A | Discharge status: Home / Self Care | Payer: Medicare Other | Source: Ambulatory Visit | Attending: Cardiology | Admitting: Cardiology

## 2023-02-16 DIAGNOSIS — I5022 Chronic systolic (congestive) heart failure: Secondary | ICD-10-CM | POA: Diagnosis not present

## 2023-02-16 NOTE — Progress Notes (Signed)
Remote ICD transmission.   

## 2023-02-18 ENCOUNTER — Encounter (HOSPITAL_COMMUNITY)
Admission: RE | Admit: 2023-02-18 | Discharge: 2023-02-18 | Disposition: A | Discharge status: Home / Self Care | Payer: Medicare Other | Source: Ambulatory Visit | Attending: Cardiology | Admitting: Cardiology

## 2023-02-18 ENCOUNTER — Telehealth: Payer: Self-pay | Admitting: Cardiology

## 2023-02-18 DIAGNOSIS — I5022 Chronic systolic (congestive) heart failure: Secondary | ICD-10-CM | POA: Diagnosis not present

## 2023-02-18 MED ORDER — CARVEDILOL 25 MG PO TABS: 25.0000 mg | ORAL_TABLET | Freq: Two times a day (BID) | ORAL | 3 refills | Status: AC

## 2023-02-18 NOTE — Telephone Encounter (Signed)
Abnormal device interrogation reviewed.  Lead parameters and battery status stable.  Multiple VT episodes.  Increase carvedilol to 12.5 mg twice daily

## 2023-02-18 NOTE — Telephone Encounter (Signed)
Pt's wife states that she got a call from someone in the office and is returning the call. Please advise.

## 2023-02-18 NOTE — Telephone Encounter (Signed)
Per pt's wife pt currently taking Carvedilol 12.5 mg bid . Discussed with Francis Dowse PA Increase Carvedilol to 25 mg bid  Pt's wife aware .Zack Seal

## 2023-02-21 ENCOUNTER — Encounter (HOSPITAL_COMMUNITY)
Admission: RE | Admit: 2023-02-21 | Discharge: 2023-02-21 | Disposition: A | Discharge status: Home / Self Care | Payer: Medicare Other | Source: Ambulatory Visit | Attending: Cardiology | Admitting: Cardiology

## 2023-02-21 DIAGNOSIS — I5022 Chronic systolic (congestive) heart failure: Secondary | ICD-10-CM

## 2023-02-21 NOTE — Addendum Note (Signed)
Encounter addended by: Crissie Figures, RN on: 02/21/2023 9:22 AM  Actions taken: Imaging Exam ended

## 2023-02-23 ENCOUNTER — Encounter (HOSPITAL_COMMUNITY)
Admission: RE | Admit: 2023-02-23 | Discharge: 2023-02-23 | Disposition: A | Discharge status: Home / Self Care | Payer: Medicare Other | Source: Ambulatory Visit | Attending: Cardiology | Admitting: Cardiology

## 2023-02-23 DIAGNOSIS — I5022 Chronic systolic (congestive) heart failure: Secondary | ICD-10-CM

## 2023-02-25 ENCOUNTER — Encounter (HOSPITAL_COMMUNITY)
Admission: RE | Admit: 2023-02-25 | Discharge: 2023-02-25 | Disposition: A | Discharge status: Home / Self Care | Payer: Medicare Other | Source: Ambulatory Visit | Attending: Cardiology | Admitting: Cardiology

## 2023-02-25 DIAGNOSIS — I5022 Chronic systolic (congestive) heart failure: Secondary | ICD-10-CM

## 2023-02-28 ENCOUNTER — Encounter (HOSPITAL_COMMUNITY)
Admission: RE | Admit: 2023-02-28 | Discharge: 2023-02-28 | Disposition: A | Discharge status: Home / Self Care | Payer: Medicare Other | Source: Ambulatory Visit | Attending: Cardiology | Admitting: Cardiology

## 2023-02-28 DIAGNOSIS — I5022 Chronic systolic (congestive) heart failure: Secondary | ICD-10-CM

## 2023-03-01 NOTE — Progress Notes (Signed)
Cardiac Individual Treatment Plan  Patient Details  Name: [name]. MRN: [id number] Date of Birth: [birth year] Referring Provider:   Flowsheet Row INTENSIVE CARDIAC REHAB ORIENT from 01/19/2023 in [facility]  Referring Provider Dorthula Nettles, MD       Initial Encounter Date:  Flowsheet Row INTENSIVE CARDIAC REHAB ORIENT from 01/19/2023 in [facility] for Heart, Vascular, & Lung Health  Date 01/19/23       Visit Diagnosis: Heart failure, chronic systolic (HCC)  Patient's Home Medications on Admission:  Current Outpatient Medications:    aspirin 81 MG chewable tablet, Chew 81 mg by mouth daily., Disp: , Rfl:    b complex vitamins capsule, Take 1 capsule by mouth daily., Disp: , Rfl:    carvedilol (COREG) 25 MG tablet, Take 1 tablet (25 mg total) by mouth 2 (two) times daily with a meal., Disp: 180 tablet, Rfl: 3   Cholecalciferol (VITAMIN D3) 50 MCG (2000 UT) TABS, Take 2,000 Units by mouth daily., Disp: , Rfl:    diclofenac Sodium (VOLTAREN) 1 % GEL, Apply 2 g topically daily as needed (pain)., Disp: , Rfl:    diphenhydrAMINE (BENADRYL ALLERGY) 25 MG tablet, Take 25 mg by mouth as needed. (Patient not taking: Reported on 02/08/2023), Disp: , Rfl:    empagliflozin (JARDIANCE) 10 MG TABS tablet, Take 1 tablet (10 mg total) by mouth daily., Disp: 30 tablet, Rfl: 3   loratadine (CLARITIN) 10 MG tablet, Take 10 mg by mouth daily. (Patient not taking: Reported on 02/08/2023), Disp: , Rfl:    magnesium oxide (MAG-OX) 400 (240 Mg) MG tablet, Take 400 mg by mouth at bedtime., Disp: , Rfl:    pantoprazole (PROTONIX) 40 MG tablet, TAKE 1 TABLET (40 MG TOTAL) BY MOUTH DAILY., Disp: 90 tablet, Rfl: 0   rosuvastatin (CRESTOR) 20 MG tablet, Take 1 tablet (20 mg total) by mouth daily., Disp: 90 tablet, Rfl: 3   sacubitril-valsartan (ENTRESTO) 49-51 MG, Take 1 tablet by mouth 2 (two) times daily., Disp: 60 tablet,  Rfl: 11   Semaglutide,0.25 or 0.5MG /DOS, (OZEMPIC, 0.25 OR 0.5 MG/DOSE,) 2 MG/1.5ML SOPN, Inject 0.5 mg into the skin once a week. If unavailable, please recommend alternative., Disp: 1.5 mL, Rfl: 3   spironolactone (ALDACTONE) 25 MG tablet, Take 25 mg by mouth daily. 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 LICA, ,, 0-39%LICA, s/p Right CEA 2018   CHF (congestive heart failure) (HCC)    Closed head injury with concussion    x2   Complication of anesthesia    Low blood pressure after colonoscopy   Diabetes mellitus without complication (HCC)    Dizziness    Ejection fraction < 50%    EF 40%, echo 2006, no ICD needed  / EF 40%, echo, 11/2009   Former cigarette smoker    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 Georganna Skeans   Sleep disorder    Dr Shelle Iron, lose weight and consider sleep study later   Sleep disorder    TIA (transient ischemic attack)    by history   Volume overload     Tobacco Use: Social History   Tobacco Use  Smoking Status Former   Current packs/day: 0.00   Types: Cigarettes   Quit date: 05/05/2002   Years since quitting: 20.8   Passive exposure: Never  Smokeless Tobacco Never    Labs: Review Flowsheet  More data exists      Latest Ref Rng & Units 07/23/2021 09/29/2021 10/28/2022 10/29/2022 12/08/2022  Labs for ITP Cardiac and Pulmonary Rehab  Cholestrol 0 - 200 mg/dL - - - - 161   LDL  (calc) 0 - 99 mg/dL - - - - 68   HDL-C >09 mg/dL - - - - 34   Trlycerides <150 mg/dL - - - - 604   Hemoglobin A1c 4.8 - 5.6 % 6.3  7.2  6.5  - -  PH, Arterial 7.35 - 7.45 - - 7.344  7.296  7.287  7.316  -  PCO2 arterial 32 - 48 mmHg - - 34.0  41.2  41.5  40.1  -  Bicarbonate 20.0 - 28.0 mmol/L - - 18.7  20.1  19.9  24.4  20.5  -  TCO2 22 - 32 mmol/L - - 20  21  21  26  22   -  Acid-base deficit 0.0 - 2.0 mmol/L - - 6.0  6.0  7.0  2.0  5.0  -  O2 Saturation % - - 96  100  88  59  91  -    Details       Multiple values from one day are sorted in reverse-chronological order         Capillary Blood Glucose: Lab Results  Component Value Date   GLUCAP 138 (H) 02/04/2023   GLUCAP 106 (H) 01/31/2023   GLUCAP 158 (H) 01/28/2023   GLUCAP 240 (H) 01/28/2023   GLUCAP 180 (H) 11/18/2022     Exercise Target Goals: Exercise Program Goal: Individual exercise prescription set using results from initial 6 min walk test and THRR while considering  patient's activity barriers and safety.   Exercise Prescription Goal: Initial exercise prescription builds to 30-45 minutes a day of aerobic activity, 2-3 days per week.  Home exercise guidelines will be given to patient during program as part of exercise prescription that the participant will acknowledge.  Activity Barriers & Risk Stratification:  Activity Barriers & Cardiac Risk Stratification - 01/19/23 1308       Activity Barriers & Cardiac Risk Stratification   Activity Barriers Decreased Ventricular Function;Deconditioning;Joint Problems    Cardiac Risk Stratification High   <5 METs on            6 Minute Walk:  6 Minute Walk     Row Name 01/19/23 1307         6 Minute Walk   Phase Initial     Distance 1440 feet     Walk Time 6 minutes     # of Rest Breaks 0     MPH 2.73     METS 3.01     RPE 9     Perceived Dyspnea  0     VO2 Peak 10.55     Symptoms No  Resting HR 64 bpm     Resting BP 102/62     Resting  Oxygen Saturation  97 %     Exercise Oxygen Saturation  during 6 min walk 98 %     Max Ex. HR 91 bpm     Max Ex. BP 124/70     2 Minute Post BP 110/66              Oxygen Initial Assessment:   Oxygen Re-Evaluation:   Oxygen Discharge (Final Oxygen Re-Evaluation):   Initial Exercise Prescription:  Initial Exercise Prescription - 01/19/23 1300       Date of Initial Exercise RX and Referring Provider   Date 01/19/23    Referring Provider Dorthula Nettles, MD    Expected Discharge Date 04/13/23      Recumbant Bike   Level 2    RPM 60    Watts 30    Minutes 15    METs 2.7      Arm Ergometer   Level 1    Watts 20    RPM 50    Minutes 15    METs 2.5      Prescription Details   Frequency (times per week) 3    Duration Progress to 30 minutes of continuous aerobic without signs/symptoms of physical distress      Intensity   THRR 40-80% of Max Heartrate 61-121    Ratings of Perceived Exertion 11-13    Perceived Dyspnea 0-4      Progression   Progression Continue progressive overload as per policy without signs/symptoms or physical distress.      Resistance Training   Training Prescription Yes    Weight 3    Reps 10-15             Perform Capillary Blood Glucose checks as needed.  Exercise Prescription Changes:   Exercise Prescription Changes     Row Name 01/28/23 1022 01/31/23 1024 02/16/23 1023 02/28/23 1022       Response to Exercise   Blood Pressure (Admit) 118/58 98/70 108/60 122/62    Blood Pressure (Exercise) 130/78 122/60 132/72 128/60    Blood Pressure (Exit) 112/60 94/60 106/60 104/54    Heart Rate (Admit) 69 bpm 69 bpm 66 bpm 68 bpm    Heart Rate (Exercise) 83 bpm 86 bpm 102 bpm 117 bpm    Heart Rate (Exit) 69 bpm 63 bpm 65 bpm 76 bpm    Rating of Perceived Exertion (Exercise) 11 11 11 12     Symptoms None None None None    Comments Off to a good start with exercise. Reviewed METs Reviewed METs Reviewed METs    Duration Continue with  30 min of aerobic exercise without signs/symptoms of physical distress. Continue with 30 min of aerobic exercise without signs/symptoms of physical distress. Continue with 30 min of aerobic exercise without signs/symptoms of physical distress. Continue with 30 min of aerobic exercise without signs/symptoms of physical distress.    Intensity THRR unchanged THRR unchanged THRR unchanged THRR unchanged      Progression   Progression Continue to progress workloads to maintain intensity without signs/symptoms of physical distress. Continue to progress workloads to maintain intensity without signs/symptoms of physical distress. Continue to progress workloads to maintain intensity without signs/symptoms of physical distress. Continue to progress workloads to maintain intensity without signs/symptoms of physical distress.    Average METs 1.7 1.9 2.8 3.6      Resistance Training   Training Prescription Yes Yes No  Relaxation day, no weights Yes    Weight 3 3 -- 4 lbs    Reps 10-15 10-15 -- 10-15    Time 10 Minutes 10 Minutes -- 10 Minutes      Interval Training   Interval Training No No No No      Recumbant Bike   Level 2 2 3 4     RPM 50 -- 63 --    Watts 13 -- 35 54    Minutes 15 15 15 15     METs 1.9 2.3 2.8 3.6      Arm Ergometer   Level 1 1 1 4     Watts 9 13 33 --    RPM 35 50 60 --    Minutes 15 15 15 15     METs 1.5 1.6 2.8 3.6      Home Exercise Plan   Plans to continue exercise at -- -- Home (comment)  Walking Home (comment)  Walking    Frequency -- -- Add 2 additional days to program exercise sessions. Add 2 additional days to program exercise sessions.    Initial Home Exercises Provided -- -- 02/07/23 02/07/23             Exercise Comments:   Exercise Comments     Row Name 01/28/23 1130 01/31/23 1114 02/07/23 1044 02/16/23 1109 02/28/23 1119   Exercise Comments Lollie Sails tolerated low intesnity exericse well without symptoms. Introduced patient to exercise routine and warm-up  and cool-down stretches. Reviewed METs with patient. Reviewed home exercise guidelines and goals with patient. Reviewed METs with patient. Reviewed METs with patient.            Exercise Goals and Review:   Exercise Goals     Row Name 01/19/23 1012             Exercise Goals   Increase Physical Activity Yes       Intervention Provide advice, education, support and counseling about physical activity/exercise needs.;Develop an individualized exercise prescription for aerobic and resistive training based on initial evaluation findings, risk stratification, comorbidities and participant's personal goals.       Expected Outcomes Short Term: Attend rehab on a regular basis to increase amount of physical activity.;Long Term: Add in home exercise to make exercise part of routine and to increase amount of physical activity.;Long Term: Exercising regularly at least 3-5 days a week.       Increase Strength and Stamina Yes       Intervention Provide advice, education, support and counseling about physical activity/exercise needs.;Develop an individualized exercise prescription for aerobic and resistive training based on initial evaluation findings, risk stratification, comorbidities and participant's personal goals.       Expected Outcomes Short Term: Increase workloads from initial exercise prescription for resistance, speed, and METs.;Short Term: Perform resistance training exercises routinely during rehab and add in resistance training at home;Long Term: Improve cardiorespiratory fitness, muscular endurance and strength as measured by increased METs and functional capacity ( )       Able to understand and use rate of perceived exertion (RPE) scale Yes       Intervention Provide education and explanation on how to use RPE scale       Expected Outcomes Short Term: Able to use RPE daily in rehab to express subjective intensity level;Long Term:  Able to use RPE to guide intensity level when  exercising independently       Knowledge and understanding of Target Heart Rate Range (THRR) Yes  Intervention Provide education and explanation of THRR including how the numbers were predicted and where they are located for reference       Expected Outcomes Short Term: Able to state/look up THRR;Short Term: Able to use daily as guideline for intensity in rehab;Long Term: Able to use THRR to govern intensity when exercising independently       Understanding of Exercise Prescription Yes       Intervention Provide education, explanation, and written materials on patient's individual exercise prescription       Expected Outcomes Short Term: Able to explain program exercise prescription;Long Term: Able to explain home exercise prescription to exercise independently                Exercise Goals Re-Evaluation :  Exercise Goals Re-Evaluation     Row Name 01/28/23 1130 02/07/23 1044           Exercise Goal Re-Evaluation   Exercise Goals Review Increase Physical Activity;Increase Strength and Stamina;Able to understand and use rate of perceived exertion (RPE) scale Increase Physical Activity;Increase Strength and Stamina;Able to understand and use rate of perceived exertion (RPE) scale;Knowledge and understanding of Target Heart Rate Range (THRR);Understanding of Exercise Prescription      Comments Donielle was able to understand and use RPE scale appropriately. Reviewed exercise prescription with Lollie Sails. He does some stretches for his back currently. He plans to walk as his mode of home exercise. He also has access to a pool and may incorporate swimming into his home exercise routine.      Expected Outcomes Progress workloads as tolerated to help increase cardiorespiratory fitness. Kindrick will walk 30 minutes 2-3 days/week in addition to exercise at cardiac rehab to achieve 150 minutes of aerobic exercise/week.               Discharge Exercise Prescription (Final Exercise Prescription  Changes):  Exercise Prescription Changes - 02/28/23 1022       Response to Exercise   Blood Pressure (Admit) 122/62    Blood Pressure (Exercise) 128/60    Blood Pressure (Exit) 104/54    Heart Rate (Admit) 68 bpm    Heart Rate (Exercise) 117 bpm    Heart Rate (Exit) 76 bpm    Rating of Perceived Exertion (Exercise) 12    Symptoms None    Comments Reviewed METs    Duration Continue with 30 min of aerobic exercise without signs/symptoms of physical distress.    Intensity THRR unchanged      Progression   Progression Continue to progress workloads to maintain intensity without signs/symptoms of physical distress.    Average METs 3.6      Resistance Training   Training Prescription Yes    Weight 4 lbs    Reps 10-15    Time 10 Minutes      Interval Training   Interval Training No      Recumbant Bike   Level 4    Watts 54    Minutes 15    METs 3.6      Arm Ergometer   Level 4    Minutes 15    METs 3.6      Home Exercise Plan   Plans to continue exercise at Home (comment)   Walking   Frequency Add 2 additional days to program exercise sessions.    Initial Home Exercises Provided 02/07/23             Nutrition:  Target Goals: Understanding of nutrition guidelines, daily intake of sodium <  1500mg , cholesterol 200mg , calories 30% from fat and 7% or less from saturated fats, daily to have 5 or more servings of fruits and vegetables.  Biometrics:  Pre Biometrics - 01/19/23 1306       Pre Biometrics   Waist Circumference 48 inches    Hip Circumference 44 inches    Waist to Hip Ratio 1.09 %    Triceps Skinfold 28 mm    % Body Fat 34.5 %    Grip Strength 34 kg    Flexibility 11.25 in    Single Leg Stand 30 seconds              Nutrition Therapy Plan and Nutrition Goals:  Nutrition Therapy & Goals - 02/28/23 1108       Nutrition Therapy   Diet Heart healthy/carbohydrate consistent diet    Drug/Food Interactions Statins/Certain Fruits      Personal  Nutrition Goals   Nutrition Goal Patient to identify strategies for reducing cardiovascular risk by attending the [employer] education and nutrition series weekly.   goal in action.   Personal Goal #2 Patient to improve diet quality by using the plate method as a guide for meal planning to include lean protein/plant protein, fruits, vegetables, whole grains, nonfat dairy as part of a well-balanced diet.   goal in action.   Personal Goal #3 Patient to identify food sources and limit daily intake of saturated fat, trans fat, sodium, and refined carbohydrates.   goal in action.   Comments Goals in action. Bud continues to attend the [employer] education and nutrition series regularly. Bud reports that he and his wife have started making some changes including switched to non-nutrititve sweeteners, implemented some [employer] recipes, and increased dietary fiber. He reports taking ozempic to aid with cardiovascular risk and blood sugar control. He reports am/fasting blood sugars 120-130s. He continues to work on reducing saturated fat intake. Patient will benefit from participation in intensive cardiac rehab for nutrition, exercise, and lifestyle modification.      Intervention Plan   Intervention Prescribe, educate and counsel regarding individualized specific dietary modifications aiming towards targeted core components such as weight, hypertension, lipid management, diabetes, heart failure and other comorbidities.;Nutrition handout(s) given to patient.    Expected Outcomes Short Term Goal: Understand basic principles of dietary content, such as calories, fat, sodium, cholesterol and nutrients.;Long Term Goal: Adherence to prescribed nutrition plan.             Nutrition Assessments:  MEDIFICTS Score Key: ?70 Need to make dietary changes  40-70 Heart Healthy Diet ? 40 Therapeutic Level Cholesterol Diet    Picture Your Plate Scores: <78 Unhealthy dietary pattern with much room for improvement. 41-50  Dietary pattern unlikely to meet recommendations for good health and room for improvement. 51-60 More healthful dietary pattern, with some room for improvement.  >60 Healthy dietary pattern, although there may be some specific behaviors that could be improved.    Nutrition Goals Re-Evaluation:  Nutrition Goals Re-Evaluation     Row Name 01/28/23 1110 02/28/23 1108           Goals   Current Weight 223 lb 1.7 oz (101.2 kg) 224 lb 13.9 oz (102 kg)      Comment triglycerides 155, HDL 34, lipoproteinA WNL no new labs; most recent labs triglycerides 155, HDL 34, lipoproteinA WNL, G9F 6.5      Expected Outcome Bud reports that he and his wife have started making some changes including switched to non-nutrititve sweeteners and increased dietary  fiber. He reports taking ozempic to aid with cardiovascular risk and blood sugar control. He reports am/fasting blood sugars ~130s. He continues to work on reducing saturated fat intake. Patient will benefit from participation in intensive cardiac rehab for nutrition, exercise, and lifestyle modification. Goals in action. Bud continues to attend the [employer] education and nutrition series regularly. Bud reports that he and his wife have started making some changes including switched to non-nutrititve sweeteners, implemented some Prit[employer] recipes, and increased dietary fiber. He reports taking ozempic to aid with cardiovascular risk and blood sugar control. He reports am/fasting blood sugars 120-130s. He continues to work on reducing saturated fat intake. Patient will benefit from participation in intensive cardiac rehab for nutrition, exercise, and lifestyle modification.               Nutrition Goals Re-Evaluation:  Nutrition Goals Re-Evaluation     Row Name 01/28/23 1110 02/28/23 1108           Goals   Current Weight 223 lb 1.7 oz (101.2 kg) 224 lb 13.9 oz (102 kg)      Comment triglycerides 155, HDL 34, lipoproteinA WNL no new labs; most recent  labs triglycerides 155, HDL 34, lipoproteinA WNL, Z6X 6.5      Expected Outcome Bud reports that he and his wife have started making some changes including switched to non-nutrititve sweeteners and increased dietary fiber. He reports taking ozempic to aid with cardiovascular risk and blood sugar control. He reports am/fasting blood sugars ~130s. He continues to work on reducing saturated fat intake. Patient will benefit from participation in intensive cardiac rehab for nutrition, exercise, and lifestyle modification. Goals in action. Bud continues to attend the [employer] education and nutrition series regularly. Bud reports that he and his wife have started making some changes including switched to non-nutrititve sweeteners, implemented some Pritik[employer] recipes, and increased dietary fiber. He reports taking ozempic to aid with cardiovascular risk and blood sugar control. He reports am/fasting blood sugars 120-130s. He continues to work on reducing saturated fat intake. Patient will benefit from participation in intensive cardiac rehab for nutrition, exercise, and lifestyle modification.               Nutrition Goals Discharge (Final Nutrition Goals Re-Evaluation):  Nutrition Goals Re-Evaluation - 02/28/23 1108       Goals   Current Weight 224 lb 13.9 oz (102 kg)    Comment no new labs; most recent labs triglycerides 155, HDL 34, lipoproteinA WNL, W9U 6.5    Expected Outcome Goals in action. Bud continues to attend the [employer] education and nutrition series regularly. Bud reports that he and his wife have started making some changes including switched to non-nutrititve sweeteners, implemented some Pritikin recipes, and increased dietary fiber. He reports taking ozempic to aid with cardiovascular risk and blood sugar control. He reports am/fasting blood sugars 120-130s. He continues to work on reducing saturated fat intake. Patient will benefit from participation in intensive cardiac rehab for nutrition,  exercise, and lifestyle modification.             Psychosocial: Target Goals: Acknowledge presence or absence of significant depression and/or stress, maximize coping skills, provide positive support system. Participant is able to verbalize types and ability to use techniques and skills needed for reducing stress and depression.  Initial Review & Psychosocial Screening:  Initial Psych Review & Screening - 01/19/23 1311       Initial Review   Current issues with None Identified  Family Dynamics   Good Support System? Yes   Bud has his wife for support     Barriers   Psychosocial barriers to participate in program There are no identifiable barriers or psychosocial needs.      Screening Interventions   Interventions Encouraged to exercise;Provide feedback about the scores to participant    Expected Outcomes Short Term goal: Identification and review with participant of any Quality of Life or Depression concerns found by scoring the questionnaire.;Long Term goal: The participant improves quality of Life and PHQ9 Scores as seen by post scores and/or verbalization of changes             Quality of Life Scores:  Quality of Life - 01/19/23 1311       Quality of Life   Select Quality of Life      Quality of Life Scores   Health/Function Pre 24.7 %    Socioeconomic Pre 27.69 %    Psych/Spiritual Pre 27.86 %    Family Pre 30 %    GLOBAL Pre 26.77 %            Scores of 19 and below usually indicate a poorer quality of life in these areas.  A difference of  2-3 points is a clinically meaningful difference.  A difference of 2-3 points in the total score of the Quality of Life Index has been associated with significant improvement in overall quality of life, self-image, physical symptoms, and general health in studies assessing change in quality of life.  PHQ-9: Review Flowsheet  More data exists      01/19/2023 11/23/2022 07/06/2022 07/03/2021 03/03/2021  Depression  screen PHQ 2/9  Decreased Interest 0 0 0 0 0 0  Down, Depressed, Hopeless 0 0 0 0 0 0  PHQ - 2 Score 0 0 0 0 0 0  Altered sleeping 0 0 - - 0  Tired, decreased energy 0 0 - - 0  Change in appetite 0 0 - - 0  Feeling bad or failure about yourself  0 0 - - 0  Trouble concentrating 0 0 - - 0  Moving slowly or fidgety/restless 0 0 - - 0  Suicidal thoughts 0 0 - - 0  PHQ-9 Score 0 0 - - 0  Difficult doing work/chores - Not difficult at all - - -    Details       Multiple values from one day are sorted in reverse-chronological order        Interpretation of Total Score  Total Score Depression Severity:  1-4 = Minimal depression, 5-9 = Mild depression, 10-14 = Moderate depression, 15-19 = Moderately severe depression, 20-27 = Severe depression   Psychosocial Evaluation and Intervention:   Psychosocial Re-Evaluation:  Psychosocial Re-Evaluation     Row Name 01/28/23 1649 02/25/23 1529           Psychosocial Re-Evaluation   Current issues with None Identified None Identified      Interventions Encouraged to attend Cardiac Rehabilitation for the exercise Encouraged to attend Cardiac Rehabilitation for the exercise      Continue Psychosocial Services  No Follow up required No Follow up required               Psychosocial Discharge (Final Psychosocial Re-Evaluation):  Psychosocial Re-Evaluation - 02/25/23 1529       Psychosocial Re-Evaluation   Current issues with None Identified    Interventions Encouraged to attend Cardiac Rehabilitation for the exercise    Continue Psychosocial Services  No Follow up required             Vocational Rehabilitation: Provide vocational rehab assistance to qualifying candidates.   Vocational Rehab Evaluation & Intervention:   Education: Education Goals: Education classes will be provided on a weekly basis, covering required topics. Participant will state understanding/return demonstration of topics presented.    Education      Row Name 01/28/23 1300     Education   Cardiac Education Topics Pritikin   Licensed conveyancer Nutrition   Nutrition Vitamins and Minerals   Instruction Review Code 1- Verbalizes Understanding   Class Start Time 1145   Class Stop Time 1230   Class Time Calculation (min) 45 min    Row Name 01/31/23 1100     Education   Cardiac Education Topics Pritikin   Geographical information systems officer Psychosocial   Psychosocial Workshop Healthy Sleep for a Healthy Heart   Instruction Review Code 1- Verbalizes Understanding   Class Start Time 1148   Class Stop Time 1235   Class Time Calculation (min) 47 min    Row Name 02/02/23 1600     Education   Cardiac Education Topics Pritikin   Customer service manager   Weekly Topic International Cuisine- Spotlight on the [employer] Zones   Instruction Review Code 1- Verbalizes Understanding   Class Start Time 1145   Class Stop Time 1226   Class Time Calculation (min) 41 min    Row Name 02/04/23 1200     Education   Cardiac Education Topics Pritikin   Psychologist, forensic Exercise Education   Exercise Education Improving Performance   Instruction Review Code 1- Verbalizes Understanding   Class Start Time 1150   Class Stop Time 1230   Class Time Calculation (min) 40 min    Row Name 02/07/23 1300     Education   Cardiac Education Topics Pritikin   Glass blower/designer Nutrition   Nutrition Workshop Fueling a Forensic psychologist   Class Start Time 1145   Class Stop Time 1234   Class Time Calculation (min) 49 min    Row Name 02/09/23 1400     Education   Cardiac Education Topics Pritikin   Customer service manager   Weekly Topic Simple Sides and Sauces   Instruction Review  Code 1- Verbalizes Understanding   Class Start Time 1145   Class Stop Time 1226   Class Time Calculation (min) 41 min    Row Name 02/16/23 1500     Education   Cardiac Education Topics Pritikin   Customer service manager   Weekly Topic Powerhouse Plant-Based Proteins   Instruction Review Code 1- Verbalizes Understanding   Class Start Time 1145   Class Stop Time 1228   Class Time Calculation (min) 43 min    Row Name 02/18/23 1200     Education   Cardiac Education Topics Pritikin   Hospital doctor Education   General Education Hypertension and Heart  Disease   Instruction Review Code 1- Verbalizes Understanding   Class Start Time 1200   Class Stop Time 1239   Class Time Calculation (min) 39 min    Row Name 02/21/23 1200     Education   Cardiac Education Topics Pritikin   Geographical information systems officer Psychosocial   Psychosocial Workshop From Head to Heart: The Power of a Healthy Outlook   Instruction Review Code 1- Verbalizes Understanding   Class Start Time 1155   Class Stop Time 1245   Class Time Calculation (min) 50 min    Row Name 02/23/23 1300     Education   Cardiac Education Topics Pritikin   Customer service manager   Weekly Topic Adding Flavor - Sodium-Free   Instruction Review Code 1- Verbalizes Understanding   Class Start Time 1140   Class Stop Time 1215   Class Time Calculation (min) 35 min    Row Name 02/25/23 1300     Education   Cardiac Education Topics Pritikin   Hospital doctor Education   General Education Heart Disease Risk Reduction   Instruction Review Code 1- Verbalizes Understanding   Class Start Time 1146   Class Stop Time 1223   Class Time Calculation (min) 37 min     Row Name 02/28/23 1100     Education   Cardiac Education Topics Pritikin   Licensed conveyancer Nutrition   Nutrition Overview of the Pritikin Eating Plan   Instruction Review Code 1- Verbalizes Understanding   Class Start Time 1145   Class Stop Time 1225   Class Time Calculation (min) 40 min            Core Videos: Exercise    Move It!  Clinical staff conducted group or individual video education with verbal and written material and guidebook.  Patient learns the recommended Pritikin exercise program. Exercise with the goal of living a long, healthy life. Some of the health benefits of exercise include controlled diabetes, healthier blood pressure levels, improved cholesterol levels, improved heart and lung capacity, improved sleep, and better body composition. Everyone should speak with their doctor before starting or changing an exercise routine.  Biomechanical Limitations Clinical staff conducted group or individual video education with verbal and written material and guidebook.  Patient learns how biomechanical limitations can impact exercise and how we can mitigate and possibly overcome limitations to have an impactful and balanced exercise routine.  Body Composition Clinical staff conducted group or individual video education with verbal and written material and guidebook.  Patient learns that body composition (ratio of muscle mass to fat mass) is a key component to assessing overall fitness, rather than body weight alone. Increased fat mass, especially visceral belly fat, can put [place] at increased risk for metabolic syndrome, type 2 diabetes, heart disease, and even death. It is recommended to combine diet and exercise (cardiovascular and resistance training) to improve your body composition. Seek guidance from your physician and exercise physiologist before implementing an exercise routine.  Exercise Action Plan Clinical staff  conducted group or individual video education with verbal and written material and guidebook.  Patient learns the recommended strategies to achieve and enjoy long-term exercise adherence, including variety, self-motivation, self-efficacy, and  positive decision making. Benefits of exercise include fitness, good health, weight management, more energy, better sleep, less stress, and overall well-being.  Medical   Heart Disease Risk Reduction Clinical staff conducted group or individual video education with verbal and written material and guidebook.  Patient learns our heart is our most vital organ as it circulates oxygen, nutrients, white blood cells, and hormones throughout the entire body, and carries waste away. Data supports a plant-based eating plan like the Pritikin Program for its effectiveness in slowing progression of and reversing heart disease. The video provides a number of recommendations to address heart disease.   Metabolic Syndrome and Belly Fat  Clinical staff conducted group or individual video education with verbal and written material and guidebook.  Patient learns what metabolic syndrome is, how it leads to heart disease, and how one can reverse it and keep it from coming back. You have metabolic syndrome if you have 3 of the following 5 criteria: abdominal obesity, high blood pressure, high triglycerides, low HDL cholesterol, and high blood sugar.  Hypertension and Heart Disease Clinical staff conducted group or individual video education with verbal and written material and guidebook.  Patient learns that high blood pressure, or hypertension, is very common in the [place]. Hypertension is largely due to excessive salt intake, but other important risk factors include being overweight, physical inactivity, drinking too much alcohol, smoking, and not eating enough potassium from fruits and vegetables. High blood pressure is a leading risk factor for heart attack, stroke,  congestive heart failure, dementia, kidney failure, and premature death. Long-term effects of excessive salt intake include stiffening of the arteries and thickening of heart muscle and organ damage. Recommendations include ways to reduce hypertension and the risk of heart disease.  Diseases of Our Time - Focusing on Diabetes Clinical staff conducted group or individual video education with verbal and written material and guidebook.  Patient learns why the best way to stop diseases of our time is prevention, through food and other lifestyle changes. Medicine (such as prescription pills and surgeries) is often only a Band-Aid on the problem, not a long-term solution. Most common diseases of our time include obesity, type 2 diabetes, hypertension, heart disease, and cancer. The Pritikin Program is recommended and has been proven to help reduce, reverse, and/or prevent the damaging effects of metabolic syndrome.  Nutrition   Overview of the Pritikin Eating Plan  Clinical staff conducted group or individual video education with verbal and written material and guidebook.  Patient learns about the Pritikin Eating Plan for disease risk reduction. The Pritikin Eating Plan emphasizes a wide variety of unrefined, minimally-processed carbohydrates, like fruits, vegetables, whole grains, and legumes. Go, Caution, and Stop food choices are explained. Plant-based and lean animal proteins are emphasized. Rationale provided for low sodium intake for blood pressure control, low added sugars for blood sugar stabilization, and low added fats and oils for coronary artery disease risk reduction and weight management.  Calorie Density  Clinical staff conducted group or individual video education with verbal and written material and guidebook.  Patient learns about calorie density and how it impacts the Pritikin Eating Plan. Knowing the characteristics of the food you choose will help you decide whether those foods will lead  to weight gain or weight loss, and whether you want to consume more or less of them. Weight loss is usually a side effect of the Pritikin Eating Plan because of its focus on low calorie-dense foods.  Label Reading  Clinical staff conducted  group or individual video education with verbal and written material and guidebook.  Patient learns about the Pritikin recommended label reading guidelines and corresponding recommendations regarding calorie density, added sugars, sodium content, and whole grains.  Dining Out - Part 1  Clinical staff conducted group or individual video education with verbal and written material and guidebook.  Patient learns that restaurant meals can be sabotaging because they can be so high in calories, fat, sodium, and/or sugar. Patient learns recommended strategies on how to positively address this and avoid unhealthy pitfalls.  Facts on Fats  Clinical staff conducted group or individual video education with verbal and written material and guidebook.  Patient learns that lifestyle modifications can be just as effective, if not more so, as many medications for lowering your risk of heart disease. A Pritikin lifestyle can help to reduce your risk of inflammation and atherosclerosis (cholesterol build-up, or plaque, in the artery walls). Lifestyle interventions such as dietary choices and physical activity address the cause of atherosclerosis. A review of the types of fats and their impact on blood cholesterol levels, along with dietary recommendations to reduce fat intake is also included.  Nutrition Action Plan  Clinical staff conducted group or individual video education with verbal and written material and guidebook.  Patient learns how to incorporate Pritikin recommendations into their lifestyle. Recommendations include planning and keeping personal health goals in mind as an important part of their success.  Healthy Mind-Set    Healthy Minds, Bodies, Hearts  Clinical  staff conducted group or individual video education with verbal and written material and guidebook.  Patient learns how to identify when they are stressed. Video will discuss the impact of that stress, as well as the many benefits of stress management. Patient will also be introduced to stress management techniques. The way we think, act, and feel has an impact on our hearts.  How Our Thoughts Can Heal Our Hearts  Clinical staff conducted group or individual video education with verbal and written material and guidebook.  Patient learns that negative thoughts can cause depression and anxiety. This can result in negative lifestyle behavior and serious health problems. Cognitive behavioral therapy is an effective method to help control our thoughts in order to change and improve our emotional outlook.  Additional Videos:  Exercise    Improving Performance  Clinical staff conducted group or individual video education with verbal and written material and guidebook.  Patient learns to use a non-linear approach by alternating intensity levels and lengths of time spent exercising to help burn more calories and lose more body fat. Cardiovascular exercise helps improve heart health, metabolism, hormonal balance, blood sugar control, and recovery from fatigue. Resistance training improves strength, endurance, balance, coordination, reaction time, metabolism, and muscle mass. Flexibility exercise improves circulation, posture, and balance. Seek guidance from your physician and exercise physiologist before implementing an exercise routine and learn your capabilities and proper form for all exercise.  Introduction to Yoga  Clinical staff conducted group or individual video education with verbal and written material and guidebook.  Patient learns about yoga, a discipline of the coming together of mind, breath, and body. The benefits of yoga include improved flexibility, improved range of motion, better posture and  core strength, increased lung function, weight loss, and positive self-image. Yoga's heart health benefits include lowered blood pressure, healthier heart rate, decreased cholesterol and triglyceride levels, improved immune function, and reduced stress. Seek guidance from your physician and exercise physiologist before implementing an exercise routine and learn your  capabilities and proper form for all exercise.  Medical   Aging: Enhancing Your Quality of Life  Clinical staff conducted group or individual video education with verbal and written material and guidebook.  Patient learns key strategies and recommendations to stay in good physical health and enhance quality of life, such as prevention strategies, having an advocate, securing a Health Care Proxy and Power of Attorney, and keeping a list of medications and system for tracking them. It also discusses how to avoid risk for bone loss.  Biology of Weight Control  Clinical staff conducted group or individual video education with verbal and written material and guidebook.  Patient learns that weight gain occurs because we consume more calories than we burn (eating more, moving less). Even if your body weight is normal, you may have higher ratios of fat compared to muscle mass. Too much body fat puts you at increased risk for cardiovascular disease, heart attack, stroke, type 2 diabetes, and obesity-related cancers. In addition to exercise, following the Pritikin Eating Plan can help reduce your risk.  Decoding Lab Results  Clinical staff conducted group or individual video education with verbal and written material and guidebook.  Patient learns that lab test reflects one measurement whose values change over time and are influenced by many factors, including medication, stress, sleep, exercise, food, hydration, pre-existing medical conditions, and more. It is recommended to use the knowledge from this video to become more involved with your lab  results and evaluate your numbers to speak with your doctor.   Diseases of Our Time - Overview  Clinical staff conducted group or individual video education with verbal and written material and guidebook.  Patient learns that according to the CDC, 50% to 70% of chronic diseases (such as obesity, type 2 diabetes, elevated lipids, hypertension, and heart disease) are avoidable through lifestyle improvements including healthier food choices, listening to satiety cues, and increased physical activity.  Sleep Disorders Clinical staff conducted group or individual video education with verbal and written material and guidebook.  Patient learns how good quality and duration of sleep are important to overall health and well-being. Patient also learns about sleep disorders and how they impact health along with recommendations to address them, including discussing with a physician.  Nutrition  Dining Out - Part 2 Clinical staff conducted group or individual video education with verbal and written material and guidebook.  Patient learns how to plan ahead and communicate in order to maximize their dining experience in a healthy and nutritious manner. Included are recommended food choices based on the type of restaurant the patient is visiting.   Fueling a Banker conducted group or individual video education with verbal and written material and guidebook.  There is a strong connection between our food choices and our health. Diseases like obesity and type 2 diabetes are very prevalent and are in large-part due to lifestyle choices. The Pritikin Eating Plan provides plenty of food and hunger-curbing satisfaction. It is easy to follow, affordable, and helps reduce health risks.  Menu Workshop  Clinical staff conducted group or individual video education with verbal and written material and guidebook.  Patient learns that restaurant meals can sabotage health goals because they are often  packed with calories, fat, sodium, and sugar. Recommendations include strategies to plan ahead and to communicate with the manager, chef, or server to help order a healthier meal.  Planning Your Eating Strategy  Clinical staff conducted group or individual video education with verbal and written  material and guidebook.  Patient learns about the Pritikin Eating Plan and its benefit of reducing the risk of disease. The Pritikin Eating Plan does not focus on calories. Instead, it emphasizes high-quality, nutrient-rich foods. By knowing the characteristics of the foods, we choose, we can determine their calorie density and make informed decisions.  Targeting Your Nutrition Priorities  Clinical staff conducted group or individual video education with verbal and written material and guidebook.  Patient learns that lifestyle habits have a tremendous impact on disease risk and progression. This video provides eating and physical activity recommendations based on your personal health goals, such as reducing LDL cholesterol, losing weight, preventing or controlling type 2 diabetes, and reducing high blood pressure.  Vitamins and Minerals  Clinical staff conducted group or individual video education with verbal and written material and guidebook.  Patient learns different ways to obtain key vitamins and minerals, including through a recommended healthy diet. It is important to discuss all supplements you take with your doctor.   Healthy Mind-Set    Smoking Cessation  Clinical staff conducted group or individual video education with verbal and written material and guidebook.  Patient learns that cigarette smoking and tobacco addiction pose a serious health risk which affects millions of people. Stopping smoking will significantly reduce the risk of heart disease, lung disease, and many forms of cancer. Recommended strategies for quitting are covered, including working with your doctor to develop a successful  plan.  Culinary   Becoming a Set designer conducted group or individual video education with verbal and written material and guidebook.  Patient learns that cooking at home can be healthy, cost-effective, quick, and puts them in control. Keys to cooking healthy recipes will include looking at your recipe, assessing your equipment needs, planning ahead, making it simple, choosing cost-effective seasonal ingredients, and limiting the use of added fats, salts, and sugars.  Cooking - Breakfast and Snacks  Clinical staff conducted group or individual video education with verbal and written material and guidebook.  Patient learns how important breakfast is to satiety and nutrition through the entire day. Recommendations include key foods to eat during breakfast to help stabilize blood sugar levels and to prevent overeating at meals later in the day. Planning ahead is also a key component.  Cooking - Educational psychologist conducted group or individual video education with verbal and written material and guidebook.  Patient learns eating strategies to improve overall health, including an approach to cook more at home. Recommendations include thinking of animal protein as a side on your plate rather than center stage and focusing instead on lower calorie dense options like vegetables, fruits, whole grains, and plant-based proteins, such as beans. Making sauces in large quantities to freeze for later and leaving the skin on your vegetables are also recommended to maximize your experience.  Cooking - Healthy Salads and Dressing Clinical staff conducted group or individual video education with verbal and written material and guidebook.  Patient learns that vegetables, fruits, whole grains, and legumes are the foundations of the Pritikin Eating Plan. Recommendations include how to incorporate each of these in flavorful and healthy salads, and how to create homemade salad dressings.  Proper handling of ingredients is also covered. Cooking - Soups and [employer] - Soups and Desserts Clinical staff conducted group or individual video education with verbal and written material and guidebook.  Patient learns that Pritikin soups and desserts make for easy, nutritious, and delicious snacks and meal  components that are low in sodium, fat, sugar, and calorie density, while high in vitamins, minerals, and filling fiber. Recommendations include simple and healthy ideas for soups and desserts.   Overview     The Pritikin Solution Program Overview Clinical staff conducted group or individual video education with verbal and written material and guidebook.  Patient learns that the results of the Pritikin Program have been documented in more than 100 articles published in peer-reviewed journals, and the benefits include reducing risk factors for (and, in some cases, even reversing) high cholesterol, high blood pressure, type 2 diabetes, obesity, and more! An overview of the three key pillars of the Pritikin Program will be covered: eating well, doing regular exercise, and having a healthy mind-set.  WORKSHOPS  Exercise: Exercise Basics: Building Your Action Plan Clinical staff led group instruction and group discussion with PowerPoint presentation and patient guidebook. To enhance the learning environment the use of posters, models and videos may be added. At the conclusion of this workshop, patients will comprehend the difference between physical activity and exercise, as well as the benefits of incorporating both, into their routine. Patients will understand the FITT (Frequency, Intensity, Time, and Type) principle and how to use it to build an exercise action plan. In addition, safety concerns and other considerations for exercise and cardiac rehab will be addressed by the presenter. The purpose of this lesson is to promote a comprehensive and effective weekly exercise routine in  order to improve patients' overall level of fitness.   Managing Heart Disease: Your Path to a Healthier Heart Clinical staff led group instruction and group discussion with PowerPoint presentation and patient guidebook. To enhance the learning environment the use of posters, models and videos may be added.At the conclusion of this workshop, patients will understand the anatomy and physiology of the heart. Additionally, they will understand how Pritikin's three pillars impact the risk factors, the progression, and the management of heart disease.  The purpose of this lesson is to provide a high-level overview of the heart, heart disease, and how the Pritikin lifestyle positively impacts risk factors.  Exercise Biomechanics Clinical staff led group instruction and group discussion with PowerPoint presentation and patient guidebook. To enhance the learning environment the use of posters, models and videos may be added. Patients will learn how the structural parts of their bodies function and how these functions impact their daily activities, movement, and exercise. Patients will learn how to promote a neutral spine, learn how to manage pain, and identify ways to improve their physical movement in order to promote healthy living. The purpose of this lesson is to expose patients to common physical limitations that impact physical activity. Participants will learn practical ways to adapt and manage aches and pains, and to minimize their effect on regular exercise. Patients will learn how to maintain good posture while sitting, walking, and lifting.  Balance Training and Fall Prevention  Clinical staff led group instruction and group discussion with PowerPoint presentation and patient guidebook. To enhance the learning environment the use of posters, models and videos may be added. At the conclusion of this workshop, patients will understand the importance of their sensorimotor skills (vision,  proprioception, and the vestibular system) in maintaining their ability to balance as they age. Patients will apply a variety of balancing exercises that are appropriate for their current level of function. Patients will understand the common causes for poor balance, possible solutions to these problems, and ways to modify their physical environment in order to minimize  their fall risk. The purpose of this lesson is to teach patients about the importance of maintaining balance as they age and ways to minimize their risk of falling.  WORKSHOPS   Nutrition:  Fueling a Ship broker led group instruction and group discussion with PowerPoint presentation and patient guidebook. To enhance the learning environment the use of posters, models and videos may be added. Patients will review the foundational principles of the Pritikin Eating Plan and understand what constitutes a serving size in each of the food groups. Patients will also learn Pritikin-friendly foods that are better choices when away from home and review make-ahead meal and snack options. Calorie density will be reviewed and applied to three nutrition priorities: weight maintenance, weight loss, and weight gain. The purpose of this lesson is to reinforce (in a group setting) the key concepts around what patients are recommended to eat and how to apply these guidelines when away from home by planning and selecting Pritikin-friendly options. Patients will understand how calorie density may be adjusted for different weight management goals.  Mindful Eating  Clinical staff led group instruction and group discussion with PowerPoint presentation and patient guidebook. To enhance the learning environment the use of posters, models and videos may be added. Patients will briefly review the concepts of the Pritikin Eating Plan and the importance of low-calorie dense foods. The concept of mindful eating will be introduced as well as the  importance of paying attention to internal hunger signals. Triggers for non-hunger eating and techniques for dealing with triggers will be explored. The purpose of this lesson is to provide patients with the opportunity to review the basic principles of the Pritikin Eating Plan, discuss the value of eating mindfully and how to measure internal cues of hunger and fullness using the Hunger Scale. Patients will also discuss reasons for non-hunger eating and learn strategies to use for controlling emotional eating.  Targeting Your Nutrition Priorities Clinical staff led group instruction and group discussion with PowerPoint presentation and patient guidebook. To enhance the learning environment the use of posters, models and videos may be added. Patients will learn how to determine their genetic susceptibility to disease by reviewing their family history. Patients will gain insight into the importance of diet as part of an overall healthy lifestyle in mitigating the impact of genetics and other environmental insults. The purpose of this lesson is to provide patients with the opportunity to assess their personal nutrition priorities by looking at their family history, their own health history and current risk factors. Patients will also be able to discuss ways of prioritizing and modifying the Pritikin Eating Plan for their highest risk areas  Menu  Clinical staff led group instruction and group discussion with PowerPoint presentation and patient guidebook. To enhance the learning environment the use of posters, models and videos may be added. Using menus brought in from [employer], or printed from [employer], patients will apply the Pritikin dining out guidelines that were presented in the [employer] video. Patients will also be able to practice these guidelines in a variety of provided scenarios. The purpose of this lesson is to provide patients with the opportunity to practice  hands-on learning of the Pritikin Dining Out guidelines with actual menus and practice scenarios.  Label Reading Clinical staff led group instruction and group discussion with PowerPoint presentation and patient guidebook. To enhance the learning environment the use of posters, models and videos may be added. Patients will review and discuss the  Pritikin label reading guidelines presented in Pritikin's Label Reading Educational series video. Using fool labels brought in from local grocery stores and markets, patients will apply the label reading guidelines and determine if the packaged food meet the Pritikin guidelines. The purpose of this lesson is to provide patients with the opportunity to review, discuss, and practice hands-on learning of the Pritikin Label Reading guidelines with actual packaged food labels. Cooking School  Pritikin's [employer] are designed to teach patients ways to prepare quick, simple, and affordable recipes at home. The importance of nutrition's role in chronic disease risk reduction is reflected in its emphasis in the overall Pritikin program. By learning how to prepare essential core Pritikin Eating Plan recipes, patients will increase control over what they eat; be able to customize the flavor of foods without the use of added salt, sugar, or fat; and improve the quality of the food they consume. By learning a set of core recipes which are easily assembled, quickly prepared, and affordable, patients are more likely to prepare more healthy foods at home. These workshops focus on convenient breakfasts, simple entres, side dishes, and desserts which can be prepared with minimal effort and are consistent with nutrition recommendations for cardiovascular risk reduction. Cooking [employer] are taught by a Armed forces logistics/support/administrative officer (RD) who has been trained by the [employer]. The chef or RD has a clear understanding of the importance of minimizing -  if not completely eliminating - added fat, sugar, and sodium in recipes. Throughout the series of Cooking School Workshop sessions, patients will learn about healthy ingredients and efficient methods of cooking to build confidence in their capability to prepare    Cooking School weekly topics:  Adding Flavor- Sodium-Free  Fast and Healthy Breakfasts  Powerhouse Plant-Based Proteins  Satisfying Salads and Dressings  Simple Sides and Sauces  International Cuisine-Spotlight on the [employer] Zones  Delicious Desserts  Savory Soups  [employer] - Meals in a Astronomer Appetizers and Snacks  Comforting Weekend Breakfasts  One-Pot Wonders   Fast Evening Meals  Landscape architect Your Pritikin Plate  WORKSHOPS   Healthy Mindset (Psychosocial):  Focused Goals, Sustainable Changes Clinical staff led group instruction and group discussion with PowerPoint presentation and patient guidebook. To enhance the learning environment the use of posters, models and videos may be added. Patients will be able to apply effective goal setting strategies to establish at least one personal goal, and then take consistent, meaningful action toward that goal. They will learn to identify common barriers to achieving personal goals and develop strategies to overcome them. Patients will also gain an understanding of how our mind-set can impact our ability to achieve goals and the importance of cultivating a positive and growth-oriented mind-set. The purpose of this lesson is to provide patients with a deeper understanding of how to set and achieve personal goals, as well as the tools and strategies needed to overcome common obstacles which may arise along the way.  From Head to Heart: The Power of a Healthy Outlook  Clinical staff led group instruction and group discussion with PowerPoint presentation and patient guidebook. To enhance the learning environment the use of posters, models and videos may be  added. Patients will be able to recognize and describe the impact of emotions and mood on physical health. They will discover the importance of self-care and explore self-care practices which may work for them. Patients will also learn how to utilize the 4 C's to  cultivate a healthier outlook and better manage stress and challenges. The purpose of this lesson is to demonstrate to patients how a healthy outlook is an essential part of maintaining good health, especially as they continue their cardiac rehab journey.  Healthy Sleep for a Healthy Heart Clinical staff led group instruction and group discussion with PowerPoint presentation and patient guidebook. To enhance the learning environment the use of posters, models and videos may be added. At the conclusion of this workshop, patients will be able to demonstrate knowledge of the importance of sleep to overall health, well-being, and quality of life. They will understand the symptoms of, and treatments for, common sleep disorders. Patients will also be able to identify daytime and nighttime behaviors which impact sleep, and they will be able to apply these tools to help manage sleep-related challenges. The purpose of this lesson is to provide patients with a general overview of sleep and outline the importance of quality sleep. Patients will learn about a few of the most common sleep disorders. Patients will also be introduced to the concept of "sleep hygiene," and discover ways to self-manage certain sleeping problems through simple daily behavior changes. Finally, the workshop will motivate patients by clarifying the links between quality sleep and their goals of heart-healthy living.   Recognizing and Reducing Stress Clinical staff led group instruction and group discussion with PowerPoint presentation and patient guidebook. To enhance the learning environment the use of posters, models and videos may be added. At the conclusion of this workshop, patients  will be able to understand the types of stress reactions, differentiate between acute and chronic stress, and recognize the impact that chronic stress has on their health. They will also be able to apply different coping mechanisms, such as reframing negative self-talk. Patients will have the opportunity to practice a variety of stress management techniques, such as deep abdominal breathing, progressive muscle relaxation, and/or guided imagery.  The purpose of this lesson is to educate patients on the role of stress in their lives and to provide healthy techniques for coping with it.  Learning Barriers/Preferences:  Learning Barriers/Preferences - 01/19/23 1311       Learning Barriers/Preferences   Learning Barriers None    Learning Preferences Computer/Internet;Group Instruction;Individual Instruction;Pictoral;Skilled Demonstration;Written Material             Education Topics:  Knowledge Questionnaire Score:  Knowledge Questionnaire Score - 01/19/23 1016       Knowledge Questionnaire Score   Pre Score 18/24             Core Components/Risk Factors/Patient Goals at Admission:  Personal Goals and Risk Factors at Admission - 01/19/23 1012       Core Components/Risk Factors/Patient Goals on Admission    Weight Management Yes    Intervention Weight Management: Develop a combined nutrition and exercise program designed to reach desired caloric intake, while maintaining appropriate intake of nutrient and fiber, sodium and fats, and appropriate energy expenditure required for the weight goal.;Weight Management: Provide education and appropriate resources to help participant work on and attain dietary goals.    Expected Outcomes Short Term: Continue to assess and modify interventions until short term weight is achieved;Long Term: Adherence to nutrition and physical activity/exercise program aimed toward attainment of established weight goal;Understanding recommendations for meals to  include 15-35% energy as protein, 25-35% energy from fat, 35-60% energy from carbohydrates, less than 200mg  of dietary cholesterol, 20-35 gm of total fiber daily;Understanding of distribution of calorie intake throughout the day with  the consumption of 4-5 meals/snacks    Diabetes Yes    Intervention Provide education about signs/symptoms and action to take for hypo/hyperglycemia.;Provide education about proper nutrition, including hydration, and aerobic/resistive exercise prescription along with prescribed medications to achieve blood glucose in normal ranges: Fasting glucose 65-99 mg/dL    Expected Outcomes Short Term: Participant verbalizes understanding of the signs/symptoms and immediate care of hyper/hypoglycemia, proper foot care and importance of medication, aerobic/resistive exercise and nutrition plan for blood glucose control.;Long Term: Attainment of HbA1C < 7%.    Heart Failure Yes    Intervention Provide a combined exercise and nutrition program that is supplemented with education, support and counseling about heart failure. Directed toward relieving symptoms such as shortness of breath, decreased exercise tolerance, and extremity edema.    Expected Outcomes Improve functional capacity of life;Short term: Attendance in program 2-3 days a week with increased exercise capacity. Reported lower sodium intake. Reported increased fruit and vegetable intake. Reports medication compliance.;Short term: Daily weights obtained and reported for increase. Utilizing diuretic protocols set by physician.;Long term: Adoption of self-care skills and reduction of barriers for early signs and symptoms recognition and intervention leading to self-care maintenance.    Lipids Yes    Intervention Provide education and support for participant on nutrition & aerobic/resistive exercise along with prescribed medications to achieve LDL 70mg , HDL >40mg .    Expected Outcomes Short Term: Participant states understanding of  desired cholesterol values and is compliant with medications prescribed. Participant is following exercise prescription and nutrition guidelines.;Long Term: Cholesterol controlled with medications as prescribed, with individualized exercise RX and with personalized nutrition plan. Value goals: LDL < 70mg , HDL > 40 mg.             Core Components/Risk Factors/Patient Goals Review:   Goals and Risk Factor Review     Row Name 01/28/23 1650 02/01/23 1434 02/25/23 1532         Core Components/Risk Factors/Patient Goals Review   Personal Goals Review Weight Management/Obesity;Diabetes;Hypertension;Lipids;Stress Weight Management/Obesity;Diabetes;Hypertension;Lipids;Stress Weight Management/Obesity;Diabetes;Hypertension;Lipids;Stress     Review Jamarrie started cardiac rehab on 01/28/23 and did well with exercise. Vital signs and CBG's were stable. Sinus rhythm with frequent PVC's [place] started cardiac rehab on 01/28/23 and is off to a good start to exercise. Vital signs and CBG's have been  stable. Bud is doing well with exercise at cardiac rehab. Vital signs and CBG's have been  stable. less ventricular ectopy has been noted since coreg dose has been increased.     Expected Outcomes Linzie will continue to participate in cardiac rehab for exercise, nutrition and lifestyle modifications [place] will continue to participate in cardiac rehab for exercise, nutrition and lifestyle modifications [place] will continue to participate in cardiac rehab for exercise, nutrition and lifestyle modifications              Core Components/Risk Factors/Patient Goals at Discharge (Final Review):   Goals and Risk Factor Review - 02/25/23 1532       Core Components/Risk Factors/Patient Goals Review   Personal Goals Review Weight Management/Obesity;Diabetes;Hypertension;Lipids;Stress    Review Bud is doing well with exercise at cardiac rehab. Vital signs and CBG's have been  stable. less ventricular ectopy has been  noted since coreg dose has been increased.    Expected Outcomes [place] will continue to participate in cardiac rehab for exercise, nutrition and lifestyle modifications             ITP Comments:  ITP Comments     Row Name 01/19/23 1011  01/28/23 1646 02/01/23 1433 02/25/23 1529     ITP Comments Dr. Armanda Magic medical director. Introduction to pritikin education/ intensive cardiac rehab. Initial orientation packet reviewed with patient. 30 Day ITP. Bud started cardiac rehab on 01/28/23 and did well with exercise 30 Day ITP. Bud started cardiac rehab on 01/28/23 and is off to a good start to exercise 30 Day ITP. Bud has good attendance and participation in  cardiac rehab             Comments: See ITP Comments

## 2023-03-02 ENCOUNTER — Encounter (HOSPITAL_COMMUNITY): Admission: RE | Admit: 2023-03-02 | Payer: Medicare Other | Source: Ambulatory Visit

## 2023-03-02 DIAGNOSIS — I5022 Chronic systolic (congestive) heart failure: Secondary | ICD-10-CM | POA: Diagnosis not present

## 2023-03-04 ENCOUNTER — Encounter (HOSPITAL_COMMUNITY)
Admission: RE | Admit: 2023-03-04 | Discharge: 2023-03-04 | Disposition: A | Discharge status: Home / Self Care | Payer: Medicare Other | Source: Ambulatory Visit | Attending: Cardiology | Admitting: Cardiology

## 2023-03-04 DIAGNOSIS — I5022 Chronic systolic (congestive) heart failure: Secondary | ICD-10-CM

## 2023-03-08 ENCOUNTER — Encounter: Payer: Medicare Other | Admitting: Cardiology

## 2023-03-09 ENCOUNTER — Encounter (HOSPITAL_COMMUNITY)
Admission: RE | Admit: 2023-03-09 | Discharge: 2023-03-09 | Disposition: A | Discharge status: Home / Self Care | Payer: Medicare Other | Source: Ambulatory Visit | Attending: Cardiology | Admitting: Cardiology

## 2023-03-09 DIAGNOSIS — I5022 Chronic systolic (congestive) heart failure: Secondary | ICD-10-CM | POA: Diagnosis present

## 2023-03-11 ENCOUNTER — Encounter (HOSPITAL_COMMUNITY)
Admission: RE | Admit: 2023-03-11 | Discharge: 2023-03-11 | Disposition: A | Discharge status: Home / Self Care | Payer: Medicare Other | Source: Ambulatory Visit | Attending: Cardiology | Admitting: Cardiology

## 2023-03-11 DIAGNOSIS — I5022 Chronic systolic (congestive) heart failure: Secondary | ICD-10-CM | POA: Diagnosis not present

## 2023-03-14 ENCOUNTER — Encounter (HOSPITAL_COMMUNITY)
Admission: RE | Admit: 2023-03-14 | Discharge: 2023-03-14 | Disposition: A | Discharge status: Home / Self Care | Payer: Medicare Other | Source: Ambulatory Visit | Attending: Cardiology

## 2023-03-14 DIAGNOSIS — I5022 Chronic systolic (congestive) heart failure: Secondary | ICD-10-CM | POA: Diagnosis not present

## 2023-03-16 ENCOUNTER — Encounter (HOSPITAL_COMMUNITY): Payer: Medicare Other

## 2023-03-18 ENCOUNTER — Encounter (HOSPITAL_COMMUNITY)
Admission: RE | Admit: 2023-03-18 | Discharge: 2023-03-18 | Disposition: A | Discharge status: Home / Self Care | Payer: Medicare Other | Source: Ambulatory Visit | Attending: Cardiology

## 2023-03-18 DIAGNOSIS — I5022 Chronic systolic (congestive) heart failure: Secondary | ICD-10-CM | POA: Diagnosis not present

## 2023-03-21 ENCOUNTER — Encounter (HOSPITAL_COMMUNITY)
Admission: RE | Admit: 2023-03-21 | Discharge: 2023-03-21 | Disposition: A | Discharge status: Home / Self Care | Payer: Medicare Other | Source: Ambulatory Visit | Attending: Cardiology | Admitting: Cardiology

## 2023-03-21 DIAGNOSIS — I5022 Chronic systolic (congestive) heart failure: Secondary | ICD-10-CM

## 2023-03-22 ENCOUNTER — Ambulatory Visit (HOSPITAL_COMMUNITY)
Admission: RE | Admit: 2023-03-22 | Discharge: 2023-03-22 | Disposition: A | Discharge status: Home / Self Care | Payer: Medicare Other | Source: Ambulatory Visit | Attending: Cardiology | Admitting: Cardiology

## 2023-03-22 ENCOUNTER — Encounter (HOSPITAL_COMMUNITY): Payer: Self-pay | Admitting: Cardiology

## 2023-03-22 VITALS — BP 116/62 | HR 61 | Wt 221.2 lb

## 2023-03-22 DIAGNOSIS — I2510 Atherosclerotic heart disease of native coronary artery without angina pectoris: Secondary | ICD-10-CM | POA: Insufficient documentation

## 2023-03-22 DIAGNOSIS — I5022 Chronic systolic (congestive) heart failure: Secondary | ICD-10-CM | POA: Diagnosis present

## 2023-03-22 DIAGNOSIS — I7409 Other arterial embolism and thrombosis of abdominal aorta: Secondary | ICD-10-CM

## 2023-03-22 DIAGNOSIS — Z951 Presence of aortocoronary bypass graft: Secondary | ICD-10-CM | POA: Insufficient documentation

## 2023-03-22 DIAGNOSIS — I110 Hypertensive heart disease with heart failure: Secondary | ICD-10-CM | POA: Diagnosis not present

## 2023-03-22 DIAGNOSIS — Z8674 Personal history of sudden cardiac arrest: Secondary | ICD-10-CM | POA: Diagnosis not present

## 2023-03-22 DIAGNOSIS — E1151 Type 2 diabetes mellitus with diabetic peripheral angiopathy without gangrene: Secondary | ICD-10-CM | POA: Diagnosis not present

## 2023-03-22 DIAGNOSIS — Z7984 Long term (current) use of oral hypoglycemic drugs: Secondary | ICD-10-CM | POA: Insufficient documentation

## 2023-03-22 DIAGNOSIS — I5020 Unspecified systolic (congestive) heart failure: Secondary | ICD-10-CM

## 2023-03-22 DIAGNOSIS — I255 Ischemic cardiomyopathy: Secondary | ICD-10-CM | POA: Diagnosis not present

## 2023-03-22 DIAGNOSIS — E785 Hyperlipidemia, unspecified: Secondary | ICD-10-CM | POA: Diagnosis not present

## 2023-03-22 LAB — BASIC METABOLIC PANEL
Anion gap: 13 (ref 5–15)
BUN: 13 mg/dL (ref 8–23)
CO2: 26 mmol/L (ref 22–32)
Calcium: 9.8 mg/dL (ref 8.9–10.3)
Chloride: 100 mmol/L (ref 98–111)
Creatinine, Ser: 1.01 mg/dL (ref 0.61–1.24)
GFR, Estimated: >60 mL/min (ref >60)
Glucose, Bld: 103 mg/dL — ABNORMAL HIGH (ref 70–99)
Potassium: 4.3 mmol/L (ref 3.5–5.1)
Sodium: 139 mmol/L (ref 135–145)

## 2023-03-22 LAB — BRAIN NATRIURETIC PEPTIDE: B Natriuretic Peptide: 68.9 pg/mL (ref 0.0–100.0)

## 2023-03-22 NOTE — Progress Notes (Signed)
ADVANCED HEART FAILURE CLINIC NOTE  Referring Physician: Tresa Garter, MD  Primary Care: Tresa Garter, MD Primary Cardiologist:  HPI: [name]. is a 67 y.o. male with CAD status post CABG x 4 in July 2003, chronic systolic heart failure, carotid artery disease status post right CEA and patch angioplasty in May 2018, PAD status post right iliofemoral endarterectomy and stenting of bilateral common iliac arteries in October 2022, hypertension, hyperlipidemia, type 2 diabetes, history of TIA and VT arrest presenting today to establish care.  He was most recently hospitalized in April 2024 after he suffered VT arrest during sedation/intubation for planned right transcarotid artery revascularization.  Echocardiogram shortly after with LVEF less than 20% and moderately reduced RV function.  He underwent right and left heart catheterization that showed total occlusion of the proximal LAD, CTO of the distal circumflex, subtotal occlusion of the proximal and mid circumflex and CTO of the RCA with patency of the RIMA to LAD and free LIMA to diagonal; occlusion of SVG to OM 2 and SVG to PDA.  Cardiac index at that time of 2.1.  He had a follow-up cardiac MRI with LVEF of 23% and underwent secondary mention ICD.  He was also started on amiodarone for a brief duration.  Interval hx: - Has been doing very well since our last appointment. ICD was placed in May 2024 by Dr. Elberta Fortis.He does not feel any functional limitations at this time from a HFrEF standpoint.    Activity level/exercise tolerance:  NYHA II Orthopnea:  Sleeps on 2 pillows Paroxysmal noctural dyspnea:  No Chest pain/pressure:  No Orthostatic lightheadedness:  No Palpitations:  No Lower extremity edema:  No Presyncope/syncope:  No Cough:  No  Past Medical History:  Diagnosis Date   AICD (automatic cardioverter/defibrillator) present 11/03/2022   Aortic valve sclerosis    aortic valve sclerosis   Arthritis     CAD (coronary artery disease)    Myoview 5/18: 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 no ischemia EF 27%   Carotid artery disease (HCC)    chronic total occlusion LCCA, retrograde flow from LECA to LICA, ,, 0-39%LICA, s/p Right CEA 2018   CHF (congestive heart failure) (HCC)    Closed head injury with concussion    x2   Complication of anesthesia    Low blood pressure after colonoscopy   Diabetes mellitus without complication (HCC)    Dizziness    Ejection fraction < 50%    EF 40%, echo 2006, no ICD needed  / EF 40%, echo, 11/2009   Former cigarette smoker    Hx of CABG 01/2002   01/2002   Hyperlipidemia    Low HDL   Hypertension    Mitral regurgitation    mild, echo, 11/2009   Myocardial infarction [facility])    "they told me i had a silent heart attack"   Peripheral vascular disease (HCC)    Dr. Georganna Skeans   Sleep disorder    Dr Shelle Iron, lose weight and consider sleep study later   Sleep disorder    TIA (transient ischemic attack)    by history   Volume overload     Current Outpatient Medications  Medication Sig Dispense Refill   aspirin 81 MG chewable tablet Chew 81 mg by mouth daily.     b complex vitamins capsule Take 1 capsule by mouth daily.     carvedilol (COREG) 25 MG tablet Take 1 tablet (25 mg total) by mouth 2 (  two) times daily with a meal. 180 tablet 3   Cholecalciferol (VITAMIN D3) 50 MCG (2000 UT) TABS Take 2,000 Units by mouth daily.     diclofenac Sodium (VOLTAREN) 1 % GEL Apply 2 g topically daily as needed (pain).     diphenhydrAMINE (BENADRYL ALLERGY) 25 MG tablet Take 25 mg by mouth as needed.     empagliflozin (JARDIANCE) 10 MG TABS tablet Take 1 tablet (10 mg total) by mouth daily. 30 tablet 3   magnesium oxide (MAG-OX) 400 (240 Mg) MG tablet Take 400 mg by mouth at bedtime.     pantoprazole (PROTONIX) 40 MG tablet TAKE 1 TABLET (40 MG TOTAL) BY MOUTH DAILY. 90 tablet 0   rosuvastatin (CRESTOR) 20 MG tablet Take 1 tablet (20 mg total) by mouth  daily. 90 tablet 3   sacubitril-valsartan (ENTRESTO) 49-51 MG Take 1 tablet by mouth 2 (two) times daily. 60 tablet 11   Semaglutide,0.25 or 0.5MG /DOS, (OZEMPIC, 0.25 OR 0.5 MG/DOSE,) 2 MG/1.5ML SOPN Inject 0.5 mg into the skin once a week. If unavailable, please recommend alternative. (Patient taking differently: Inject 1 mg into the skin once a week. If unavailable, please recommend alternative.) 1.5 mL 3   spironolactone (ALDACTONE) 25 MG tablet Take 25 mg by mouth daily.      No current facility-administered medications for this encounter.    No Known Allergies    Social History   Socioeconomic History   Marital status: Married    Spouse name: Clydie Braun   Number of children: 2   Years of education: Not on file   Highest education level: Associate degree: occupational, Scientist, product/process development, or vocational program  Occupational History   Occupation: Retired  Tobacco Use   Smoking status: Former    Current packs/day: 0.00    Types: Cigarettes    Quit date: 05/05/2002    Years since quitting: 20.8    Passive exposure: Never   Smokeless tobacco: Never  Vaping Use   Vaping status: Never Used  Substance and Sexual Activity   Alcohol use: Yes    Alcohol/week: 0.0 standard drinks of alcohol    Comment: beer every now and then   Drug use: No   Sexual activity: Yes  Other Topics Concern   Not on file  Social History Narrative   No Regular exercise   Social Determinants of Health   Financial Resource Strain: Low Risk  (11/20/2022)   Overall Financial Resource Strain (CARDIA)    Difficulty of Paying Living Expenses: Not very hard  Food Insecurity: No Food Insecurity (11/20/2022)   Hunger Vital Sign    Worried About Running Out of Food in the Last Year: Never true    Ran Out of Food in the Last Year: Never true  Transportation Needs: No Transportation Needs (11/20/2022)   PRAPARE - Administrator, Civil Service (Medical): No    Lack of Transportation (Non-Medical): No  Physical  Activity: Insufficiently Active (11/20/2022)   Exercise Vital Sign    Days of Exercise per Week: 5 days    Minutes of Exercise per Session: 10 min  Stress: No Stress Concern Present (11/20/2022)   [employer] of Occupational Health - Occupational Stress Questionnaire    Feeling of Stress : Only a little  Social Connections: Socially Integrated (11/20/2022)   Social Connection and Isolation Panel [NHANES]    Frequency of Communication with Friends and Family: More than three times a week    Frequency of Social Gatherings with Friends and Family: Once  a week    Attends Religious Services: More than 4 times per year    Active Member of Clubs or Organizations: Yes    Attends Banker Meetings: More than 4 times per year    Marital Status: Married  Catering manager Violence: Not At Risk (11/02/2022)   Humiliation, Afraid, Rape, and Kick questionnaire    Fear of Current or Ex-Partner: No    Emotionally Abused: No    Physically Abused: No    Sexually Abused: No      Family History  Problem Relation Age of Onset   Stroke Mother    Diabetes Father    COPD Father    Arthritis Father    Heart disease Father    Coronary artery disease Other        Male 1st degree relative <50   Diabetes Other        1st degree relative   Stroke Other        Male 1st degree relative<50   Deep vein thrombosis Daughter    Pulmonary fibrosis Daughter     PHYSICAL EXAM: Vitals:   03/22/23 1334  BP: 116/62  Pulse: 61  SpO2: 97%   GENERAL: Well nourished, well developed, and in no apparent distress at rest.  HEENT: Negative for arcus senilis or xanthelasma. There is no scleral icterus.  The mucous membranes are pink and moist.   NECK: Supple, No masses. Normal carotid upstrokes without bruits. No masses or thyromegaly.    CHEST: There are no chest wall deformities. There is no chest wall tenderness. Respirations are unlabored.  Lungs- CTA B/L CARDIAC:  JVP: 7 cm          Normal rate  with regular rhythm. No murmurs, rubs or gallops.  Pulses are 2+ and symmetrical in upper and lower extremities. No edema.  ABDOMEN: Soft, non-tender, non-distended. There are no masses or hepatomegaly. There are normal bowel sounds.  EXTREMITIES: Warm and well perfused with no cyanosis, clubbing.  LYMPHATIC: No axillary or supraclavicular lymphadenopathy.  NEUROLOGIC: Patient is oriented x3 with no focal or lateralizing neurologic deficits.  PSYCH: Patients affect is appropriate, there is no evidence of anxiety or depression.  SKIN: Warm and dry; no lesions or wounds.     DATA REVIEW  ECG: 12/08/22: NSR  As per my personal interpretation  ECHO: 01/05/23: LVEF 25%-30% TTE: Post arrest (4/24): EF < 20%, RV moderately down TTE: (2015): 30-35%  CATH: R/LHC (4/24): severe native vessel coronary artery disease with total occlusion of the proximal LAD, subtotal occlusion of the proximal and mid circumflex, total occlusion of the distal circumflex, and total occlusion of the midportion of the nondominant RCA; s/p aortocoronary bypass surgery with continued patency of the RIMA to LAD and free LIMA to diagonal, total occlusion of the saphenous vein graft to OM 2 and saphenous vein graft to left PDA.  CO/CI 4.75/2.12, mild pulm htn with mean PA 27, PVR 2.5 WU. mRA 9, mPCWP 15   CMR (4/24):  LVEF 23%, RVEF 54%, LGE consistent with infarct in basal to mid inferior wall and basal to apical lateral wall.   ASSESSMENT & PLAN:  Heart failure with reduced ejection fraction Etiology of HF: Ischemic cardiomyopathy, left heart cath from 4/24 noted above with severe multivessel native coronary artery disease and now occlusion of the saphenous vein grafts.  Cardiac MRI with a EF of 23% and severe LGE in the basal to mid inferior wall NYHA class / AHA Stage:II Volume status &  Diuretics: Euvolemic; taking lasix PRN.  Vasodilators: Entresto 49/51mg  BID Beta-Blocker: Coreg 6.25 mg twice daily, limited by  bradycardia. MRA: Spironolactone 25 mg daily Cardiometabolic: Jardiance 10 mg Devices therapies & Valvulopathies: Secondary prevention ICD placed in April 2024 Advanced therapies: His severe LE PAD may limit options. Cardiac index of 2.1 in 4/24; Repeat TTE today (01/05/23) w/ LVEF of 30%. Improved from prior but I still suspect he may require VAD evaluation in the future. Will plan for CPX in 3-107m.   2.  Coronary artery disease status post CABG - s/p CABG x4 (LIMA as a free graft to the diagonal, RIMA to LAD, SVG to OM 2, and SVG to PDA) in 01/2002 with Dr. Dorris Fetch  - [facility] 4/24 showed severe native vessel coronary artery disease with total occlusion of the proximal LAD and subtotal occlusion of the proximal and mid circumflex, total occlusion of the distal circumflex, and total occlusion of the midportion of the nondominant RCA, continued patency of the RIMA to LAD and free LIMA to diagonal.  Total occlusion of the saphenous vein graft to OM 2 and saphenous vein graft to left PDA. No targets for intervention  - stable w/o CP ; D/C ISDN - continue medical management  3. VT Arrest - followed by Dr. Elberta Fortis; likely due to underlying ischemic heart disease with occluded SVG during intubation/sedation.   4. 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 Aborted attempt at rt trans carotid revascularization 10/29/22 per above  - right TCAR 11/18/22 by Dr. Lenell Antu - on ASA, statin  4. T2DM - A1C of 6.5 - Jardiance 10 - Ozempic  5. HLD - Repeat lipid panel with LDL of 68.  - Will refer to lipid clinic for PCSK9i evaluation; he has a very high burden of atherosclerotic disease which could limit future therapies (ie transplant/LVAD).    Rosary Filosa Advanced Heart Failure Mechanical Circulatory Support

## 2023-03-22 NOTE — Patient Instructions (Addendum)
Medication Changes:  No Changes In Medications at this time.   Lab Work:  Labs done today, your results will be available in MyChart, we will contact you for abnormal readings.  YOU HAVE BEEN REFERRED TO [facility] THEY WILL REACH OUT TO YOU OR CALL TO ARRANGE THIS. PLEASE CALL us WITH ANY CONCERNS   Follow-Up in: 4 months PLEASE CALL OUR OFFICE AROUND November 2024 TO GET SCHEDULED FOR YOUR APPOINTMENT. PHONE NUMBER IS [phone] OPTION 2   At the [facility], you and your health needs are our priority. We have a designated team specialized in the treatment of Heart Failure. This Care Team includes your primary Heart Failure Specialized Cardiologist (physician), Advanced Practice Providers (APPs- Physician Assistants and Nurse Practitioners), and Pharmacist who all work together to provide you with the care you need, when you need it.   You may see any of the following providers on your designated Care Team at your next follow up:  Dr. Arvilla Meres Dr. Marca Ancona Dr. Marcos Eke, NP Robbie Lis, [facility] [place], [place] Brynda Peon, NP Karle Plumber, PharmD   Please be sure to bring in all your medications bottles to every appointment.   Need to Contact us:  If you have any questions or concerns before your next appointment please send [place] a message through [place] or call our office at [phone].    TO LEAVE A MESSAGE FOR THE NURSE SELECT OPTION 2, PLEASE LEAVE A MESSAGE INCLUDING: YOUR NAME DATE OF BIRTH CALL BACK NUMBER REASON FOR CALL**this is important as we prioritize the call backs  YOU WILL RECEIVE A CALL BACK THE SAME DAY AS LONG AS YOU CALL BEFORE 4:00 PM

## 2023-03-23 ENCOUNTER — Encounter (HOSPITAL_COMMUNITY)
Admission: RE | Admit: 2023-03-23 | Discharge: 2023-03-23 | Disposition: A | Discharge status: Home / Self Care | Payer: Medicare Other | Source: Ambulatory Visit | Attending: Cardiology

## 2023-03-23 DIAGNOSIS — I5022 Chronic systolic (congestive) heart failure: Secondary | ICD-10-CM

## 2023-03-24 ENCOUNTER — Telehealth: Payer: Self-pay | Admitting: Pharmacist

## 2023-03-24 ENCOUNTER — Other Ambulatory Visit (HOSPITAL_COMMUNITY): Payer: Self-pay

## 2023-03-24 ENCOUNTER — Encounter: Payer: Self-pay | Admitting: Student

## 2023-03-24 ENCOUNTER — Ambulatory Visit: Payer: Medicare Other | Attending: Cardiovascular Disease | Admitting: Student

## 2023-03-24 DIAGNOSIS — E785 Hyperlipidemia, unspecified: Secondary | ICD-10-CM | POA: Insufficient documentation

## 2023-03-24 DIAGNOSIS — I2510 Atherosclerotic heart disease of native coronary artery without angina pectoris: Secondary | ICD-10-CM | POA: Insufficient documentation

## 2023-03-24 NOTE — Assessment & Plan Note (Signed)
Assessment:  LDL goal: <55 mg/dl last LDLc  82  mg/dl while on high intensity statin  Tolerates high intensity statins well without any side effects  Risk Factors: hypertension, hyperlipidemia, type 2 diabetes, history of TIA,CAD status post CABG x 4 in July 2003,arotid artery disease status post right CEA and patch angioplasty in May 2018, PAD status post right iliofemoral endarterectomy and stenting of bilateral common iliac arteries in October 2022  Discussed next potential options ( Zetia, PCSK-9 inhibitors, bempedoic acid and inclisiran); cost, dosing efficacy, side effects  Follows heart healthy diet and go to rehab 3 times per week for exercise   Plan: Continue taking current medications - Crestor 20 mg daily  Given commodities and family hx of premature ASCVD reasonable to consider PCSK9i over Zetia (Will apply for PA for PCSK9i; will inform patient upon approval (prefers MyChart message) Lipid lab due in 2-3 months after starting [facility]

## 2023-03-24 NOTE — Progress Notes (Signed)
Patient ID: [name].                 DOB: 08/09/1955                    MRN: [id number]      HPI: [name] is a 67 y.o. male patient referred to lipid clinic by Dr.Sabharwal. PMH is significant for CAD status post CABG x 4 in July 2003, chronic systolic heart failure, carotid artery disease status post right CEA and patch angioplasty in May 2018, PAD status post right iliofemoral endarterectomy and stenting of bilateral common iliac arteries in October 2022, hypertension, hyperlipidemia, type 2 diabetes, history of TIA and VT arrest.   Patient presented today for lipid clinic we reviewed options for lowering LDL cholesterol, including ezetimibe, PCSK-9 inhibitors, bempedoic acid and inclisiran.  Discussed mechanisms of action, dosing, side effects and potential decreases in LDL cholesterol.  Also reviewed cost information and potential options for patient assistance.  Current Medications: Crestor 20 mg  Intolerances: none  Risk Factors: hypertension, hyperlipidemia, type 2 diabetes, history of TIA,CAD status post CABG x 4 in July 2003,arotid artery disease status post right CEA and patch angioplasty in May 2018, PAD status post right iliofemoral endarterectomy and stenting of bilateral common iliac arteries in October 2022 LDL goal: <55 mg/dl  Last lipid lab at [facility] on 08/29: TC 138, TG 137, HDL 31.9, LDL 82  Diet: heart healthy diet, watches fat intake and salt intake   Exercise: 3 times week rehab    Will be joining [employer]   Family History:  Relation Problem Comments  Mother (Deceased) Stroke at age <63      Father (Deceased) Arthritis   COPD   Diabetes   Heart disease and lung cancer      Maternal Grandmother (Deceased)   Maternal Grandfather (Deceased)   Paternal Grandmother (Deceased)   Paternal Grandfather (Deceased)   Daughter Deep vein thrombosis   Pulmonary fibrosis      Social History:  Alcohol: 1 beer per month  Smoking: quit smoking at age of  58  Labs: Lipid Panel     Component Value Date/Time   CHOL 133 12/08/2022 1518   TRIG 155 (H) 12/08/2022 1518   TRIG 92 05/09/2006 0732   HDL 34 (L) 12/08/2022 1518   CHOLHDL 3.9 12/08/2022 1518   VLDL 31 12/08/2022 1518   LDLCALC 68 12/08/2022 1518   LDLDIRECT 198.1 02/08/2013 1112    Past Medical History:  Diagnosis Date   AICD (automatic cardioverter/defibrillator) present 11/03/2022   Aortic valve sclerosis    aortic valve sclerosis   Arthritis    CAD (coronary artery disease)    Myoview 5/18: 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 no ischemia EF 27%   Carotid artery disease (HCC)    chronic total occlusion LCCA, retrograde flow from LECA to LICA, ,, 0-39%LICA, s/p Right CEA 2018   CHF (congestive heart failure) (HCC)    Closed head injury with concussion    x2   Complication of anesthesia    Low blood pressure after colonoscopy   Diabetes mellitus without complication (HCC)    Dizziness    Ejection fraction < 50%    EF 40%, echo 2006, no ICD needed  / EF 40%, echo, 11/2009   Former cigarette smoker    Hx of CABG 01/2002   01/2002   Hyperlipidemia    Low HDL   Hypertension  Mitral regurgitation    mild, echo, 11/2009   Myocardial infarction [facility])    "they told me i had a silent heart attack"   Peripheral vascular disease (HCC)    Dr. Georganna Skeans   Sleep disorder    Dr Shelle Iron, lose weight and consider sleep study later   Sleep disorder    TIA (transient ischemic attack)    by history   Volume overload     Current Outpatient Medications on File Prior to Visit  Medication Sig Dispense Refill   aspirin 81 MG chewable tablet Chew 81 mg by mouth daily.     b complex vitamins capsule Take 1 capsule by mouth daily.     carvedilol (COREG) 25 MG tablet Take 1 tablet (25 mg total) by mouth 2 (two) times daily with a meal. 180 tablet 3   Cholecalciferol (VITAMIN D3) 50 MCG (2000 UT) TABS Take 2,000 Units by mouth daily.     diclofenac Sodium (VOLTAREN)  1 % GEL Apply 2 g topically daily as needed (pain).     diphenhydrAMINE (BENADRYL ALLERGY) 25 MG tablet Take 25 mg by mouth as needed.     empagliflozin (JARDIANCE) 10 MG TABS tablet Take 1 tablet (10 mg total) by mouth daily. 30 tablet 3   magnesium oxide (MAG-OX) 400 (240 Mg) MG tablet Take 400 mg by mouth at bedtime.     pantoprazole (PROTONIX) 40 MG tablet TAKE 1 TABLET (40 MG TOTAL) BY MOUTH DAILY. 90 tablet 0   rosuvastatin (CRESTOR) 20 MG tablet Take 1 tablet (20 mg total) by mouth daily. 90 tablet 3   sacubitril-valsartan (ENTRESTO) 49-51 MG Take 1 tablet by mouth 2 (two) times daily. 60 tablet 11   Semaglutide,0.25 or 0.5MG /DOS, (OZEMPIC, 0.25 OR 0.5 MG/DOSE,) 2 MG/1.5ML SOPN Inject 0.5 mg into the skin once a week. If unavailable, please recommend alternative. (Patient taking differently: Inject 1 mg into the skin once a week. If unavailable, please recommend alternative.) 1.5 mL 3   spironolactone (ALDACTONE) 25 MG tablet Take 25 mg by mouth daily.      No current facility-administered medications on file prior to visit.    No Known Allergies  Assessment/Plan:  1. Hyperlipidemia -  Problem  Hyperlipidemia   Current Medications: Crestor 20 mg  Intolerances: none  Risk Factors: hypertension, hyperlipidemia, type 2 diabetes, history of TIA,CAD status post CABG x 4 in July 2003,arotid artery disease status post right CEA and patch angioplasty in May 2018, PAD status post right iliofemoral endarterectomy and stenting of bilateral common iliac arteries in October 2022 LDL goal: <55 mg/dl  Last lipid lab at [facility] on 08/29: TC 138, TG 137, HDL 31.9, LDL 82     Hyperlipidemia Assessment:  LDL goal: <55 mg/dl last LDLc  82  mg/dl while on high intensity statin  Tolerates high intensity statins well without any side effects  Risk Factors: hypertension, hyperlipidemia, type 2 diabetes, history of TIA,CAD status post CABG x 4 in July 2003,arotid artery disease status post right CEA and  patch angioplasty in May 2018, PAD status post right iliofemoral endarterectomy and stenting of bilateral common iliac arteries in October 2022  Discussed next potential options ( Zetia, PCSK-9 inhibitors, bempedoic acid and inclisiran); cost, dosing efficacy, side effects  Follows heart healthy diet and go to rehab 3 times per week for exercise   Plan: Continue taking current medications - Crestor 20 mg daily  Given commodities and family hx of premature ASCVD reasonable to consider PCSK9i over Zetia (  Will apply for PA for PCSK9i; will inform patient upon approval (prefers MyChart message) Lipid lab due in 2-3 months after starting PCSK9i    Thank you,  Carmela Hurt, Pharm.D [facility] [address] [place], [place] [address]  Phone: [phone]; Fax: [phone]

## 2023-03-24 NOTE — Patient Instructions (Addendum)
Your Results:             Your most recent labs Goal  Total Cholesterol 138 < 200  Triglycerides 137 < 150  HDL (happy/good cholesterol) 31.9 > 40  LDL (lousy/bad cholesterol 82 < 55   Medication changes: We will start the process to get PCSK9i ( Repatha or Praluent) covered by your insurance.  Once the prior authorization is complete, we will call you to let you know and confirm pharmacy information.    Praluent is a cholesterol medication that improved your body's ability to get rid of "bad cholesterol" known as LDL. It can lower your LDL up to 60%. It is an injection that is given under the skin every 2 weeks. The most common side effects of Praluent include runny nose, symptoms of the common cold, rarely flu or flu-like symptoms, back/muscle pain in about 3-4% of the patients, and redness, pain, or bruising at the injection site.    Repatha is a cholesterol medication that improved your body's ability to get rid of "bad cholesterol" known as LDL. It can lower your LDL up to 60%! It is an injection that is given under the skin every 2 weeks. The most common side effects of Repatha include runny nose, symptoms of the common cold, rarely flu or flu-like symptoms, back/muscle pain in about 3-4% of the patients, and redness, pain, or bruising at the injection site.   Lab orders: We want to repeat labs after 2-3 months.  We will send you a lab order to remind you once we get closer to that time.

## 2023-03-25 ENCOUNTER — Encounter (HOSPITAL_COMMUNITY)
Admission: RE | Admit: 2023-03-25 | Discharge: 2023-03-25 | Disposition: A | Discharge status: Home / Self Care | Payer: Medicare Other | Source: Ambulatory Visit | Attending: Cardiology

## 2023-03-25 ENCOUNTER — Telehealth: Payer: Self-pay

## 2023-03-25 ENCOUNTER — Other Ambulatory Visit (HOSPITAL_COMMUNITY): Payer: Self-pay

## 2023-03-25 DIAGNOSIS — E785 Hyperlipidemia, unspecified: Secondary | ICD-10-CM

## 2023-03-25 DIAGNOSIS — I5022 Chronic systolic (congestive) heart failure: Secondary | ICD-10-CM

## 2023-03-25 MED ORDER — REPATHA SURECLICK 140 MG/ML ~~LOC~~ SOAJ: 140.0000 mg | SUBCUTANEOUS | 3 refills | Status: DC

## 2023-03-25 NOTE — Telephone Encounter (Signed)
PA request has been Submitted. New Encounter created for follow up. For additional info see Pharmacy Prior Auth telephone encounter from 03/25/23.

## 2023-03-25 NOTE — Telephone Encounter (Signed)
PA for PCSK9i requested

## 2023-03-25 NOTE — Telephone Encounter (Signed)
Called patient, inform about approval and follow up lab. Lab scheduled on Dec 17.

## 2023-03-25 NOTE — Telephone Encounter (Signed)
Pharmacy Patient Advocate Encounter  Received notification from [facility] that Prior Authorization for REPATHA has been APPROVED from 03/25/23 to 07/05/23

## 2023-03-25 NOTE — Addendum Note (Signed)
Addended by: Tylene Fantasia on: 03/25/2023 01:10 PM   Modules accepted: Orders

## 2023-03-25 NOTE — Telephone Encounter (Signed)
Pharmacy Patient Advocate Encounter   Received notification from Physician's Office that prior authorization for REPATHA is required/requested.   Insurance verification completed.   The patient is insured through [facility] .   Per test claim: PA required; PA submitted to [facility] via CoverMyMeds Key/confirmation #/EOC [id number] Status is pending

## 2023-03-28 ENCOUNTER — Other Ambulatory Visit (HOSPITAL_COMMUNITY): Payer: Self-pay

## 2023-03-28 ENCOUNTER — Encounter (HOSPITAL_COMMUNITY)
Admission: RE | Admit: 2023-03-28 | Discharge: 2023-03-28 | Disposition: A | Discharge status: Home / Self Care | Payer: Medicare Other | Source: Ambulatory Visit | Attending: Cardiology | Admitting: Cardiology

## 2023-03-28 ENCOUNTER — Encounter: Payer: Self-pay | Admitting: Cardiovascular Disease

## 2023-03-28 DIAGNOSIS — I5022 Chronic systolic (congestive) heart failure: Secondary | ICD-10-CM | POA: Diagnosis not present

## 2023-03-28 MED ORDER — REPATHA SURECLICK 140 MG/ML ~~LOC~~ SOAJ: 140.0000 mg | SUBCUTANEOUS | 3 refills | Status: DC

## 2023-03-28 NOTE — Addendum Note (Signed)
Addended by: Tylene Fantasia on: 03/28/2023 11:27 AM   Modules accepted: Orders

## 2023-03-29 NOTE — Progress Notes (Signed)
Cardiac Individual Treatment Plan  Patient Details  Name: [name]. MRN: [id number] Date of Birth: 01-19-1956 Referring Provider:   Flowsheet Row INTENSIVE CARDIAC REHAB ORIENT from 01/19/2023 in [facility]  Referring Provider Dorthula Nettles, MD       Initial Encounter Date:  Flowsheet Row INTENSIVE CARDIAC REHAB ORIENT from 01/19/2023 in [facility]  Date 01/19/23       Visit Diagnosis: Heart failure, chronic systolic (HCC)  Patient's Home Medications on Admission:  Current Outpatient Medications:    aspirin 81 MG chewable tablet, Chew 81 mg by mouth daily., Disp: , Rfl:    b complex vitamins capsule, Take 1 capsule by mouth daily., Disp: , Rfl:    carvedilol (COREG) 25 MG tablet, Take 1 tablet (25 mg total) by mouth 2 (two) times daily with a meal., Disp: 180 tablet, Rfl: 3   Cholecalciferol (VITAMIN D3) 50 MCG (2000 UT) TABS, Take 2,000 Units by mouth daily., Disp: , Rfl:    diclofenac Sodium (VOLTAREN) 1 % GEL, Apply 2 g topically daily as needed (pain)., Disp: , Rfl:    diphenhydrAMINE (BENADRYL ALLERGY) 25 MG tablet, Take 25 mg by mouth as needed., Disp: , Rfl:    empagliflozin (JARDIANCE) 10 MG TABS tablet, Take 1 tablet (10 mg total) by mouth daily., Disp: 30 tablet, Rfl: 3   Evolocumab (REPATHA SURECLICK) 140 MG/ML SOAJ, Inject 140 mg into the skin every 14 (fourteen) days., Disp: 6 mL, Rfl: 3   magnesium oxide (MAG-OX) 400 (240 Mg) MG tablet, Take 400 mg by mouth at bedtime., Disp: , Rfl:    pantoprazole (PROTONIX) 40 MG tablet, TAKE 1 TABLET (40 MG TOTAL) BY MOUTH DAILY., Disp: 90 tablet, Rfl: 0   rosuvastatin (CRESTOR) 20 MG tablet, Take 1 tablet (20 mg total) by mouth daily., Disp: 90 tablet, Rfl: 3   sacubitril-valsartan (ENTRESTO) 49-51 MG, Take 1 tablet by mouth 2 (two) times daily., Disp: 60 tablet, Rfl: 11   Semaglutide, 1 MG/DOSE, 4  MG/3ML SOPN, Inject 1 mg into the skin once a week., Disp: , Rfl:    spironolactone (ALDACTONE) 25 MG tablet, Take 25 mg by mouth daily. 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 LICA, ,, 0-39%LICA, s/p Right CEA 2018   CHF (congestive heart failure) (HCC)    Closed head injury with concussion    x2   Complication of anesthesia    Low blood pressure after colonoscopy   Diabetes mellitus without complication (HCC)    Dizziness    Ejection fraction < 50%    EF 40%, echo 2006, no ICD needed  / EF 40%, echo, 11/2009   Former cigarette smoker    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 Georganna Skeans   Sleep disorder    Dr Shelle Iron, lose weight and consider sleep study later   Sleep disorder    TIA (transient ischemic attack)    by history   Volume overload     Tobacco Use: Social History   Tobacco Use  Smoking Status Former   Current packs/day: 0.00   Types: Cigarettes   Quit date: 05/05/2002   Years since quitting: 20.9   Passive exposure: Never  Smokeless Tobacco Never    Labs: Review Flowsheet  More data exists      Latest Ref Rng & Units 07/23/2021 09/29/2021 10/28/2022 10/29/2022 12/08/2022  Labs for ITP Cardiac and Pulmonary Rehab  Cholestrol 0 - 200 mg/dL - - - - 161   LDL (calc) 0 - 99 mg/dL - - - - 68   HDL-C >09 mg/dL - - - - 34   Trlycerides <150 mg/dL - - - - 604   Hemoglobin A1c 4.8 - 5.6 % 6.3   7.2  6.5  - -  PH, Arterial 7.35 - 7.45 - - 7.344  7.296  7.287  7.316  -  PCO2 arterial 32 - 48 mmHg - - 34.0  41.2  41.5  40.1  -  Bicarbonate 20.0 - 28.0 mmol/L - - 18.7  20.1  19.9  24.4  20.5  -  TCO2 22 - 32 mmol/L - - 20  21  21  26  22   -  Acid-base deficit 0.0 - 2.0 mmol/L - - 6.0  6.0  7.0  2.0  5.0  -  O2 Saturation % - - 96  100  88  59  91  -    Details       Multiple values from one day are sorted in reverse-chronological order         Capillary Blood Glucose: Lab Results  Component Value Date   GLUCAP 138 (H) 02/04/2023   GLUCAP 106 (H) 01/31/2023   GLUCAP 158 (H) 01/28/2023   GLUCAP 240 (H) 01/28/2023   GLUCAP 180 (H) 11/18/2022     Exercise Target Goals: Exercise Program Goal: Individual exercise prescription set using results from initial 6 min walk test and THRR while considering  patient's activity barriers and safety.   Exercise Prescription Goal: Initial exercise prescription builds to 30-45 minutes a day of aerobic activity, 2-3 days per week.  Home exercise guidelines will be given to patient during program as part of exercise prescription that the participant will acknowledge.  Activity Barriers & Risk Stratification:  Activity Barriers & Cardiac Risk Stratification - 01/19/23 1308       Activity Barriers & Cardiac Risk Stratification   Activity Barriers Decreased Ventricular Function;Deconditioning;Joint Problems    Cardiac Risk Stratification High   <5 METs on            6 Minute Walk:  6 Minute Walk     Row Name 01/19/23 1307         6 Minute Walk   Phase Initial     Distance 1440 feet     Walk Time 6 minutes     # of Rest Breaks 0     MPH 2.73     METS 3.01     RPE 9     Perceived Dyspnea  0     VO2 Peak 10.55     Symptoms No     Resting HR 64 bpm     Resting BP 102/62  Resting Oxygen Saturation  97 %     Exercise Oxygen Saturation  during 6 min walk 98 %     Max Ex. HR 91 bpm     Max Ex. BP 124/70     2  Minute Post BP 110/66              Oxygen Initial Assessment:   Oxygen Re-Evaluation:   Oxygen Discharge (Final Oxygen Re-Evaluation):   Initial Exercise Prescription:  Initial Exercise Prescription - 01/19/23 1300       Date of Initial Exercise RX and Referring Provider   Date 01/19/23    Referring Provider Dorthula Nettles, MD    Expected Discharge Date 04/13/23      Recumbant Bike   Level 2    RPM 60    Watts 30    Minutes 15    METs 2.7      Arm Ergometer   Level 1    Watts 20    RPM 50    Minutes 15    METs 2.5      Prescription Details   Frequency (times per week) 3    Duration Progress to 30 minutes of continuous aerobic without signs/symptoms of physical distress      Intensity   THRR 40-80% of Max Heartrate 61-121    Ratings of Perceived Exertion 11-13    Perceived Dyspnea 0-4      Progression   Progression Continue progressive overload as per policy without signs/symptoms or physical distress.      Resistance Training   Training Prescription Yes    Weight 3    Reps 10-15             Perform Capillary Blood Glucose checks as needed.  Exercise Prescription Changes:   Exercise Prescription Changes     Row Name 01/28/23 1022 01/31/23 1024 02/16/23 1023 02/28/23 1022 03/14/23 1030     Response to Exercise   Blood Pressure (Admit) 118/58 98/70 108/60 122/62 112/64   Blood Pressure (Exercise) 130/78 122/60 132/72 128/60 118/58   Blood Pressure (Exit) 112/60 94/60 106/60 104/54 96/56   Heart Rate (Admit) 69 bpm 69 bpm 66 bpm 68 bpm 65 bpm   Heart Rate (Exercise) 83 bpm 86 bpm 102 bpm 117 bpm 102 bpm   Heart Rate (Exit) 69 bpm 63 bpm 65 bpm 76 bpm 73 bpm   Rating of Perceived Exertion (Exercise) 11 11 11 12 12    Symptoms None None None None None   Comments Off to a good start with exercise. Reviewed METs Reviewed METs Reviewed METs Increased hand weights today.   Duration Continue with 30 min of aerobic exercise without signs/symptoms  of physical distress. Continue with 30 min of aerobic exercise without signs/symptoms of physical distress. Continue with 30 min of aerobic exercise without signs/symptoms of physical distress. Continue with 30 min of aerobic exercise without signs/symptoms of physical distress. Continue with 30 min of aerobic exercise without signs/symptoms of physical distress.   Intensity THRR unchanged THRR unchanged THRR unchanged THRR unchanged THRR unchanged     Progression   Progression Continue to progress workloads to maintain intensity without signs/symptoms of physical distress. Continue to progress workloads to maintain intensity without signs/symptoms of physical distress. Continue to progress workloads to maintain intensity without signs/symptoms of physical distress. Continue to progress workloads to maintain intensity without signs/symptoms of physical distress. Continue to progress workloads to maintain intensity without signs/symptoms of physical distress.   Average METs 1.7 1.9 2.8 3.6  3.6     Resistance Training   Training Prescription Yes Yes No  Relaxation day, no weights Yes Yes   Weight 3 3 -- 4 lbs 5 lbs   Reps 10-15 10-15 -- 10-15 10-15   Time 10 Minutes 10 Minutes -- 10 Minutes 10 Minutes     Interval Training   Interval Training No No No No No     Recumbant Bike   Level 2 2 3 4 4    RPM 50 -- 63 -- 60   Watts 13 -- 35 54 51   Minutes 15 15 15 15 15    METs 1.9 2.3 2.8 3.6 3.5     Arm Ergometer   Level 1 1 1 4 4    Watts 9 13 33 -- 51   RPM 35 50 60 -- 59   Minutes 15 15 15 15 15    METs 1.5 1.6 2.8 3.6 3.7     Home Exercise Plan   Plans to continue exercise at -- -- Home (comment)  Walking Home (comment)  Walking Home (comment)  Walking   Frequency -- -- Add 2 additional days to program exercise sessions. Add 2 additional days to program exercise sessions. Add 2 additional days to program exercise sessions.   Initial Home Exercises Provided -- -- 02/07/23 02/07/23 02/07/23     Row Name 03/28/23 1050             Response to Exercise   Blood Pressure (Admit) 92/54       Blood Pressure (Exercise) 132/62       Blood Pressure (Exit) 102/62       Heart Rate (Admit) 69 bpm       Heart Rate (Exercise) 104 bpm       Heart Rate (Exit) 62 bpm       Rating of Perceived Exertion (Exercise) 13       Symptoms None       Comments Reviewed METs with Lollie Sails.       Duration Continue with 30 min of aerobic exercise without signs/symptoms of physical distress.       Intensity THRR unchanged         Progression   Progression Continue to progress workloads to maintain intensity without signs/symptoms of physical distress.       Average METs 3.8         Resistance Training   Training Prescription Yes       Weight 5 lbs       Reps 10-15       Time 10 Minutes         Interval Training   Interval Training No         Recumbant Bike   Level 4       Minutes 15       METs 4.1         Arm Ergometer   Level 4       Watts 57       RPM 62       Minutes 15       METs 3.6         Home Exercise Plan   Plans to continue exercise at Home (comment)  Walking       Frequency Add 2 additional days to program exercise sessions.       Initial Home Exercises Provided 02/07/23                Exercise Comments:   Exercise  Comments     Row Name 01/28/23 1130 01/31/23 1114 02/07/23 1044 02/16/23 1109 02/28/23 1119   Exercise Comments Lollie Sails tolerated low intesnity exericse well without symptoms. Introduced patient to exercise routine and warm-up and cool-down stretches. Reviewed METs with patient. Reviewed home exercise guidelines and goals with patient. Reviewed METs with patient. Reviewed METs with patient.    Row Name 03/02/23 5409 03/28/23 1106         Exercise Comments Reviewed goals with patient. Reviewed METs with Lollie Sails.               Exercise Goals and Review:   Exercise Goals     Row Name 01/19/23 1012             Exercise Goals   Increase  Physical Activity Yes       Intervention Provide advice, education, support and counseling about physical activity/exercise needs.;Develop an individualized exercise prescription for aerobic and resistive training based on initial evaluation findings, risk stratification, comorbidities and participant's personal goals.       Expected Outcomes Short Term: Attend rehab on a regular basis to increase amount of physical activity.;Long Term: Add in home exercise to make exercise part of routine and to increase amount of physical activity.;Long Term: Exercising regularly at least 3-5 days a week.       Increase Strength and Stamina Yes       Intervention Provide advice, education, support and counseling about physical activity/exercise needs.;Develop an individualized exercise prescription for aerobic and resistive training based on initial evaluation findings, risk stratification, comorbidities and participant's personal goals.       Expected Outcomes Short Term: Increase workloads from initial exercise prescription for resistance, speed, and METs.;Short Term: Perform resistance training exercises routinely during rehab and add in resistance training at home;Long Term: Improve cardiorespiratory fitness, muscular endurance and strength as measured by increased METs and functional capacity ( )       Able to understand and use rate of perceived exertion (RPE) scale Yes       Intervention Provide education and explanation on how to use RPE scale       Expected Outcomes Short Term: Able to use RPE daily in rehab to express subjective intensity level;Long Term:  Able to use RPE to guide intensity level when exercising independently       Knowledge and understanding of Target Heart Rate Range (THRR) Yes       Intervention Provide education and explanation of THRR including how the numbers were predicted and where they are located for reference       Expected Outcomes Short Term: Able to state/look up THRR;Short  Term: Able to use daily as guideline for intensity in rehab;Long Term: Able to use THRR to govern intensity when exercising independently       Understanding of Exercise Prescription Yes       Intervention Provide education, explanation, and written materials on patient's individual exercise prescription       Expected Outcomes Short Term: Able to explain program exercise prescription;Long Term: Able to explain home exercise prescription to exercise independently                Exercise Goals Re-Evaluation :  Exercise Goals Re-Evaluation     Row Name 01/28/23 1130 02/07/23 1044 03/02/23 0959         Exercise Goal Re-Evaluation   Exercise Goals Review Increase Physical Activity;Increase Strength and Stamina;Able to understand and use rate of perceived exertion (RPE) scale Increase Physical  Activity;Increase Strength and Stamina;Able to understand and use rate of perceived exertion (RPE) scale;Knowledge and understanding of Target Heart Rate Range (THRR);Understanding of Exercise Prescription Increase Physical Activity;Increase Strength and Stamina;Able to understand and use rate of perceived exertion (RPE) scale;Knowledge and understanding of Target Heart Rate Range (THRR);Understanding of Exercise Prescription     Comments [name] was able to understand and use RPE scale appropriately. Reviewed exercise prescription with Lollie Sails. He does some stretches for his back currently. He plans to walk as his mode of home exercise. He also has access to a pool and may incorporate swimming into his home exercise routine. [name] has a variety of hand weights at home that he uses for his resistance training. Encouraged him to add some walking to his routine for his aerobic exercise. He is trying to eat better as one of his goals.     Expected Outcomes Progress workloads as tolerated to help increase cardiorespiratory fitness. Depriest will walk 30 minutes 2-3 days/week in addition to exercise at cardiac rehab to  achieve 150 minutes of aerobic exercise/week. [name] will add some walking to his home exercise routine.              Discharge Exercise Prescription (Final Exercise Prescription Changes):  Exercise Prescription Changes - 03/28/23 1050       Response to Exercise   Blood Pressure (Admit) 92/54    Blood Pressure (Exercise) 132/62    Blood Pressure (Exit) 102/62    Heart Rate (Admit) 69 bpm    Heart Rate (Exercise) 104 bpm    Heart Rate (Exit) 62 bpm    Rating of Perceived Exertion (Exercise) 13    Symptoms None    Comments Reviewed METs with Lollie Sails.    Duration Continue with 30 min of aerobic exercise without signs/symptoms of physical distress.    Intensity THRR unchanged      Progression   Progression Continue to progress workloads to maintain intensity without signs/symptoms of physical distress.    Average METs 3.8      Resistance Training   Training Prescription Yes    Weight 5 lbs    Reps 10-15    Time 10 Minutes      Interval Training   Interval Training No      Recumbant Bike   Level 4    Minutes 15    METs 4.1      Arm Ergometer   Level 4    Watts 57    RPM 62    Minutes 15    METs 3.6      Home Exercise Plan   Plans to continue exercise at Home (comment)   Walking   Frequency Add 2 additional days to program exercise sessions.    Initial Home Exercises Provided 02/07/23             Nutrition:  Target Goals: Understanding of nutrition guidelines, daily intake of sodium 1500mg , cholesterol 200mg , calories 30% from fat and 7% or less from saturated fats, daily to have 5 or more servings of fruits and vegetables.  Biometrics:  Pre Biometrics - 01/19/23 1306       Pre Biometrics   Waist Circumference 48 inches    Hip Circumference 44 inches    Waist to Hip Ratio 1.09 %    Triceps Skinfold 28 mm    % Body Fat 34.5 %    Grip Strength 34 kg    Flexibility 11.25 in    Single Leg Stand 30 seconds  Nutrition Therapy Plan and  Nutrition Goals:  Nutrition Therapy & Goals - 02/28/23 1108       Nutrition Therapy   Diet Heart healthy/carbohydrate consistent diet    Drug/Food Interactions Statins/Certain Fruits      Personal Nutrition Goals   Nutrition Goal Patient to identify strategies for reducing cardiovascular risk by attending the [employer] education and nutrition series weekly.   goal in action.   Personal Goal #2 Patient to improve diet quality by using the plate method as a guide for meal planning to include lean protein/plant protein, fruits, vegetables, whole grains, nonfat dairy as part of a well-balanced diet.   goal in action.   Personal Goal #3 Patient to identify food sources and limit daily intake of saturated fat, trans fat, sodium, and refined carbohydrates.   goal in action.   Comments Goals in action. [name] continues to attend the [employer] education and nutrition series regularly. [name] reports that he and his wife have started making some changes including switched to non-nutrititve sweeteners, implemented some [employer] recipes, and increased dietary fiber. He reports taking ozempic to aid with cardiovascular risk and blood sugar control. He reports am/fasting blood sugars 120-130s. He continues to work on reducing saturated fat intake. Patient will benefit from participation in intensive cardiac rehab for nutrition, exercise, and lifestyle modification.      Intervention Plan   Intervention Prescribe, educate and counsel regarding individualized specific dietary modifications aiming towards targeted core components such as weight, hypertension, lipid management, diabetes, heart failure and other comorbidities.;Nutrition handout(s) given to patient.    Expected Outcomes Short Term Goal: Understand basic principles of dietary content, such as calories, fat, sodium, cholesterol and nutrients.;Long Term Goal: Adherence to prescribed nutrition plan.             Nutrition Assessments:  MEDIFICTS Score  Key: >=70 Need to make dietary changes  40-70 Heart Healthy Diet <= 40 Therapeutic Level Cholesterol Diet    Picture Your Plate Scores: <73 Unhealthy dietary pattern with much room for improvement. 41-50 Dietary pattern unlikely to meet recommendations for good health and room for improvement. 51-60 More healthful dietary pattern, with some room for improvement.  >60 Healthy dietary pattern, although there may be some specific behaviors that could be improved.    Nutrition Goals Re-Evaluation:  Nutrition Goals Re-Evaluation     Row Name 01/28/23 1110 02/28/23 1108           Goals   Current Weight 223 lb 1.7 oz (101.2 kg) 224 lb 13.9 oz (102 kg)      Comment triglycerides 155, HDL 34, lipoproteinA WNL no new labs; most recent labs triglycerides 155, HDL 34, lipoproteinA WNL, X1G 6.5      Expected Outcome [name] reports that he and his wife have started making some changes including switched to non-nutrititve sweeteners and increased dietary fiber. He reports taking ozempic to aid with cardiovascular risk and blood sugar control. He reports am/fasting blood sugars ~130s. He continues to work on reducing saturated fat intake. Patient will benefit from participation in intensive cardiac rehab for nutrition, exercise, and lifestyle modification. Goals in action. [name] continues to attend the [employer] education and nutrition series regularly. [name] reports that he and his wife have started making some changes including switched to non-nutrititve sweeteners, implemented some [employer] recipes, and increased dietary fiber. He reports taking ozempic to aid with cardiovascular risk and blood sugar control. He reports am/fasting blood sugars 120-130s. He continues to work on reducing saturated fat intake. Patient  will benefit from participation in intensive cardiac rehab for nutrition, exercise, and lifestyle modification.               Nutrition Goals Re-Evaluation:  Nutrition Goals Re-Evaluation      Row Name 01/28/23 1110 02/28/23 1108           Goals   Current Weight 223 lb 1.7 oz (101.2 kg) 224 lb 13.9 oz (102 kg)      Comment triglycerides 155, HDL 34, lipoproteinA WNL no new labs; most recent labs triglycerides 155, HDL 34, lipoproteinA WNL, X3K 6.5      Expected Outcome [name] reports that he and his wife have started making some changes including switched to non-nutrititve sweeteners and increased dietary fiber. He reports taking ozempic to aid with cardiovascular risk and blood sugar control. He reports am/fasting blood sugars ~130s. He continues to work on reducing saturated fat intake. Patient will benefit from participation in intensive cardiac rehab for nutrition, exercise, and lifestyle modification. Goals in action. [name] continues to attend the [employer] education and nutrition series regularly. [name] reports that he and his wife have started making some changes including switched to non-nutrititve sweeteners, implemented some [employer]rit[employer] recipes, and increased dietary fiber. He reports taking ozempic to aid with cardiovascular risk and blood sugar control. He reports am/fasting blood sugars 120-130s. He continues to work on reducing saturated fat intake. Patient will benefit from participation in intensive cardiac rehab for nutrition, exercise, and lifestyle modification.               Nutrition Goals Discharge (Final Nutrition Goals Re-Evaluation):  Nutrition Goals Re-Evaluation - 02/28/23 1108       Goals   Current Weight 224 lb 13.9 oz (102 kg)    Comment no new labs; most recent labs triglycerides 155, HDL 34, lipoproteinA WNL, G4W 6.5    Expected Outcome Goals in action. [name] continues to attend the [employer] education and nutrition series regularly. [name] reports that he and his wife have started making some changes including switched to non-nutrititve sweeteners, implemented some [employer] recipes, and increased dietary fiber. He reports taking ozempic to aid with  cardiovascular risk and blood sugar control. He reports am/fasting blood sugars 120-130s. He continues to work on reducing saturated fat intake. Patient will benefit from participation in intensive cardiac rehab for nutrition, exercise, and lifestyle modification.             Psychosocial: Target Goals: Acknowledge presence or absence of significant depression and/or stress, maximize coping skills, provide positive support system. Participant is able to verbalize types and ability to use techniques and skills needed for reducing stress and depression.  Initial Review & Psychosocial Screening:  Initial Psych Review & Screening - 01/19/23 1311       Initial Review   Current issues with None Identified      Family Dynamics   Good Support System? Yes   [name] has his wife for support     Barriers   Psychosocial barriers to participate in program There are no identifiable barriers or psychosocial needs.      Screening Interventions   Interventions Encouraged to exercise;Provide feedback about the scores to participant    Expected Outcomes Short Term goal: Identification and review with participant of any Quality of Life or Depression concerns found by scoring the questionnaire.;Long Term goal: The participant improves quality of Life and PHQ9 Scores as seen by post scores and/or verbalization of changes  Quality of Life Scores:  Quality of Life - 01/19/23 1311       Quality of Life   Select Quality of Life      Quality of Life Scores   Health/Function Pre 24.7 %    Socioeconomic Pre 27.69 %    Psych/Spiritual Pre 27.86 %    Family Pre 30 %    GLOBAL Pre 26.77 %            Scores of 19 and below usually indicate a poorer quality of life in these areas.  A difference of  2-3 points is a clinically meaningful difference.  A difference of 2-3 points in the total score of the Quality of Life Index has been associated with significant improvement in overall quality  of life, self-image, physical symptoms, and general health in studies assessing change in quality of life.  PHQ-9: Review Flowsheet  More data exists      01/19/2023 11/23/2022 07/06/2022 07/03/2021 03/03/2021  Depression screen PHQ 2/9  Decreased Interest 0 0 0 0 0 0  Down, Depressed, Hopeless 0 0 0 0 0 0  PHQ - 2 Score 0 0 0 0 0 0  Altered sleeping 0 0 - - 0  Tired, decreased energy 0 0 - - 0  Change in appetite 0 0 - - 0  Feeling bad or failure about yourself  0 0 - - 0  Trouble concentrating 0 0 - - 0  Moving slowly or fidgety/restless 0 0 - - 0  Suicidal thoughts 0 0 - - 0  PHQ-9 Score 0 0 - - 0  Difficult doing work/chores - Not difficult at all - - -    Details       Multiple values from one day are sorted in reverse-chronological order        Interpretation of Total Score  Total Score Depression Severity:  1-4 = Minimal depression, 5-9 = Mild depression, 10-14 = Moderate depression, 15-19 = Moderately severe depression, 20-27 = Severe depression   Psychosocial Evaluation and Intervention:   Psychosocial Re-Evaluation:  Psychosocial Re-Evaluation     Row Name 01/28/23 1649 02/25/23 1529 03/24/23 1611         Psychosocial Re-Evaluation   Current issues with None Identified None Identified None Identified     Interventions Encouraged to attend Cardiac Rehabilitation for the exercise Encouraged to attend Cardiac Rehabilitation for the exercise Encouraged to attend Cardiac Rehabilitation for the exercise     Continue Psychosocial Services  No Follow up required No Follow up required No Follow up required              Psychosocial Discharge (Final Psychosocial Re-Evaluation):  Psychosocial Re-Evaluation - 03/24/23 1611       Psychosocial Re-Evaluation   Current issues with None Identified    Interventions Encouraged to attend Cardiac Rehabilitation for the exercise    Continue Psychosocial Services  No Follow up required             Vocational  Rehabilitation: Provide vocational rehab assistance to qualifying candidates.   Vocational Rehab Evaluation & Intervention:   Education: Education Goals: Education classes will be provided on a weekly basis, covering required topics. Participant will state understanding/return demonstration of topics presented.    Education     Row Name 01/28/23 1300     Education   Cardiac Education Topics Pritikin   Licensed conveyancer Nutrition  Nutrition Vitamins and Minerals   Instruction Review Code 1- Verbalizes Understanding   Class Start Time 1145   Class Stop Time 1230   Class Time Calculation (min) 45 min    Row Name 01/31/23 1100     Education   Cardiac Education Topics Pritikin   Geographical information systems officer Psychosocial   Psychosocial Workshop Healthy Sleep for a Healthy Heart   Instruction Review Code 1- Verbalizes Understanding   Class Start Time 1148   Class Stop Time 1235   Class Time Calculation (min) 47 min    Row Name 02/02/23 1600     Education   Cardiac Education Topics Pritikin   Customer service manager   Weekly Topic International Cuisine- Spotlight on the [employer] Zones   Instruction Review Code 1- Verbalizes Understanding   Class Start Time 1145   Class Stop Time 1226   Class Time Calculation (min) 41 min    Row Name 02/04/23 1200     Education   Cardiac Education Topics Pritikin   Psychologist, forensic Exercise Education   Exercise Education Improving Performance   Instruction Review Code 1- Verbalizes Understanding   Class Start Time 1150   Class Stop Time 1230   Class Time Calculation (min) 40 min    Row Name 02/07/23 1300     Education   Cardiac Education Topics Pritikin   Glass blower/designer Nutrition    Nutrition Workshop Fueling a Forensic psychologist   Class Start Time 1145   Class Stop Time 1234   Class Time Calculation (min) 49 min    Row Name 02/09/23 1400     Education   Cardiac Education Topics Pritikin   Customer service manager   Weekly Topic Simple Sides and Sauces   Instruction Review Code 1- Verbalizes Understanding   Class Start Time 1145   Class Stop Time 1226   Class Time Calculation (min) 41 min    Row Name 02/16/23 1500     Education   Cardiac Education Topics Pritikin   Customer service manager   Weekly Topic Powerhouse Plant-Based Proteins   Instruction Review Code 1- Verbalizes Understanding   Class Start Time 1145   Class Stop Time 1228   Class Time Calculation (min) 43 min    Row Name 02/18/23 1200     Education   Cardiac Education Topics Pritikin   Hospital doctor Education   General Education Hypertension and Heart Disease   Instruction Review Code 1- Verbalizes Understanding   Class Start Time 1200   Class Stop Time 1239   Class Time Calculation (min) 39 min    Row Name 02/21/23 1200     Education   Cardiac Education Topics Pritikin   Geographical information systems officer Psychosocial   Psychosocial Workshop From Head to Heart: The Power of a Healthy Outlook   Instruction Review Code 1- Verbalizes Understanding   Class Start Time 1155   Class Stop Time 1245   Class  Time Calculation (min) 50 min    Row Name 02/23/23 1300     Education   Cardiac Education Topics Pritikin   Customer service manager   Weekly Topic Adding Flavor - Sodium-Free   Instruction Review Code 1- Verbalizes Understanding   Class Start Time 1140   Class Stop Time 1215   Class Time Calculation (min) 35 min    Row Name 02/25/23 1300      Education   Cardiac Education Topics Pritikin   Hospital doctor Education   General Education Heart Disease Risk Reduction   Instruction Review Code 1- Verbalizes Understanding   Class Start Time 1146   Class Stop Time 1223   Class Time Calculation (min) 37 min    Row Name 02/28/23 1100     Education   Cardiac Education Topics Pritikin   Licensed conveyancer Nutrition   Nutrition Overview of the Pritikin Eating Plan   Instruction Review Code 1- Verbalizes Understanding   Class Start Time 1145   Class Stop Time 1225   Class Time Calculation (min) 40 min    Row Name 03/04/23 1300     Education   Cardiac Education Topics Pritikin   Writer Psychosocial   Psychosocial Healthy Minds, Bodies, Hearts   Instruction Review Code 1- Verbalizes Understanding   Class Start Time 1145   Class Stop Time 1222   Class Time Calculation (min) 37 min    Row Name 03/09/23 1400     Education   Cardiac Education Topics Pritikin   Customer service manager   Weekly Topic Personalizing Your Pritikin Plate   Instruction Review Code 1- Verbalizes Understanding   Class Start Time 1150   Class Stop Time 1225   Class Time Calculation (min) 35 min    Row Name 03/11/23 1100     Education   Cardiac Education Topics Pritikin   Select Workshops     Workshops   Educator Exercise Physiologist   Select Exercise   Exercise Workshop Location manager and Fall Prevention   Instruction Review Code 1- Verbalizes Understanding   Class Start Time 1151   Class Stop Time 1238   Class Time Calculation (min) 47 min    Row Name 03/14/23 1300     Education   Cardiac Education Topics Pritikin   Glass blower/designer Nutrition   Nutrition Workshop Label  Reading   Instruction Review Code 1- Tax inspector   Class Start Time 1145   Class Stop Time 1234   Class Time Calculation (min) 49 min    Row Name 03/18/23 1300     Education   Cardiac Education Topics Pritikin   Nurse, children's   Educator Dietitian   Select Nutrition   Nutrition Other  Label Reading   Instruction Review Code 1- Verbalizes Understanding   Class Start Time 1150   Class Stop Time 1235   Class Time Calculation (min) 45 min    Row Name 03/21/23 1500     Education   Cardiac Education Topics Pritikin  Select Workshops     Garment/textile technologist Psychosocial   Psychosocial Workshop Recognizing and Reducing Stress   Instruction Review Code 1- Verbalizes Understanding   Class Start Time 1148   Class Stop Time 1237   Class Time Calculation (min) 49 min    Row Name 03/23/23 1400     Education   Cardiac Education Topics Pritikin   Customer service manager   Weekly Topic Tasty Appetizers and Snacks   Instruction Review Code 1- Verbalizes Understanding   Class Start Time 1145   Class Stop Time 1225   Class Time Calculation (min) 40 min            Core Videos: Exercise    Move It!  Clinical staff conducted group or individual video education with verbal and written material and guidebook.  Patient learns the recommended Pritikin exercise program. Exercise with the goal of living a long, healthy life. Some of the health benefits of exercise include controlled diabetes, healthier blood pressure levels, improved cholesterol levels, improved heart and lung capacity, improved sleep, and better body composition. Everyone should speak with their doctor before starting or changing an exercise routine.  Biomechanical Limitations Clinical staff conducted group or individual video education with verbal and written material and guidebook.  Patient learns how  biomechanical limitations can impact exercise and how we can mitigate and possibly overcome limitations to have an impactful and balanced exercise routine.  Body Composition Clinical staff conducted group or individual video education with verbal and written material and guidebook.  Patient learns that body composition (ratio of muscle mass to fat mass) is a key component to assessing overall fitness, rather than body weight alone. Increased fat mass, especially visceral belly fat, can put [place] at increased risk for metabolic syndrome, type 2 diabetes, heart disease, and even death. It is recommended to combine diet and exercise (cardiovascular and resistance training) to improve your body composition. Seek guidance from your physician and exercise physiologist before implementing an exercise routine.  Exercise Action Plan Clinical staff conducted group or individual video education with verbal and written material and guidebook.  Patient learns the recommended strategies to achieve and enjoy long-term exercise adherence, including variety, self-motivation, self-efficacy, and positive decision making. Benefits of exercise include fitness, good health, weight management, more energy, better sleep, less stress, and overall well-being.  Medical   Heart Disease Risk Reduction Clinical staff conducted group or individual video education with verbal and written material and guidebook.  Patient learns our heart is our most vital organ as it circulates oxygen, nutrients, white blood cells, and hormones throughout the entire body, and carries waste away. Data supports a plant-based eating plan like the Pritikin Program for its effectiveness in slowing progression of and reversing heart disease. The video provides a number of recommendations to address heart disease.   Metabolic Syndrome and Belly Fat  Clinical staff conducted group or individual video education with verbal and written material and guidebook.   Patient learns what metabolic syndrome is, how it leads to heart disease, and how one can reverse it and keep it from coming back. You have metabolic syndrome if you have 3 of the following 5 criteria: abdominal obesity, high blood pressure, high triglycerides, low HDL cholesterol, and high blood sugar.  Hypertension and Heart Disease Clinical staff conducted group or individual video education with verbal and written material and guidebook.  Patient learns that high  blood pressure, or hypertension, is very common in the [place]. Hypertension is largely due to excessive salt intake, but other important risk factors include being overweight, physical inactivity, drinking too much alcohol, smoking, and not eating enough potassium from fruits and vegetables. High blood pressure is a leading risk factor for heart attack, stroke, congestive heart failure, dementia, kidney failure, and premature death. Long-term effects of excessive salt intake include stiffening of the arteries and thickening of heart muscle and organ damage. Recommendations include ways to reduce hypertension and the risk of heart disease.  Diseases of Our Time - Focusing on Diabetes Clinical staff conducted group or individual video education with verbal and written material and guidebook.  Patient learns why the best way to stop diseases of our time is prevention, through food and other lifestyle changes. Medicine (such as prescription pills and surgeries) is often only a Band-Aid on the problem, not a long-term solution. Most common diseases of our time include obesity, type 2 diabetes, hypertension, heart disease, and cancer. The Pritikin Program is recommended and has been proven to help reduce, reverse, and/or prevent the damaging effects of metabolic syndrome.  Nutrition   Overview of the Pritikin Eating Plan  Clinical staff conducted group or individual video education with verbal and written material and guidebook.  Patient  learns about the Pritikin Eating Plan for disease risk reduction. The Pritikin Eating Plan emphasizes a wide variety of unrefined, minimally-processed carbohydrates, like fruits, vegetables, whole grains, and legumes. Go, Caution, and Stop food choices are explained. Plant-based and lean animal proteins are emphasized. Rationale provided for low sodium intake for blood pressure control, low added sugars for blood sugar stabilization, and low added fats and oils for coronary artery disease risk reduction and weight management.  Calorie Density  Clinical staff conducted group or individual video education with verbal and written material and guidebook.  Patient learns about calorie density and how it impacts the Pritikin Eating Plan. Knowing the characteristics of the food you choose will help you decide whether those foods will lead to weight gain or weight loss, and whether you want to consume more or less of them. Weight loss is usually a side effect of the Pritikin Eating Plan because of its focus on low calorie-dense foods.  Label Reading  Clinical staff conducted group or individual video education with verbal and written material and guidebook.  Patient learns about the Pritikin recommended label reading guidelines and corresponding recommendations regarding calorie density, added sugars, sodium content, and whole grains.  Dining Out - Part 1  Clinical staff conducted group or individual video education with verbal and written material and guidebook.  Patient learns that restaurant meals can be sabotaging because they can be so high in calories, fat, sodium, and/or sugar. Patient learns recommended strategies on how to positively address this and avoid unhealthy pitfalls.  Facts on Fats  Clinical staff conducted group or individual video education with verbal and written material and guidebook.  Patient learns that lifestyle modifications can be just as effective, if not more so, as many  medications for lowering your risk of heart disease. A Pritikin lifestyle can help to reduce your risk of inflammation and atherosclerosis (cholesterol build-up, or plaque, in the artery walls). Lifestyle interventions such as dietary choices and physical activity address the cause of atherosclerosis. A review of the types of fats and their impact on blood cholesterol levels, along with dietary recommendations to reduce fat intake is also included.  Nutrition Action Plan  Clinical staff  conducted group or individual video education with verbal and written material and guidebook.  Patient learns how to incorporate Pritikin recommendations into their lifestyle. Recommendations include planning and keeping personal health goals in mind as an important part of their success.  Healthy Mind-Set    Healthy Minds, Bodies, Hearts  Clinical staff conducted group or individual video education with verbal and written material and guidebook.  Patient learns how to identify when they are stressed. Video will discuss the impact of that stress, as well as the many benefits of stress management. Patient will also be introduced to stress management techniques. The way we think, act, and feel has an impact on our hearts.  How Our Thoughts Can Heal Our Hearts  Clinical staff conducted group or individual video education with verbal and written material and guidebook.  Patient learns that negative thoughts can cause depression and anxiety. This can result in negative lifestyle behavior and serious health problems. Cognitive behavioral therapy is an effective method to help control our thoughts in order to change and improve our emotional outlook.  Additional Videos:  Exercise    Improving Performance  Clinical staff conducted group or individual video education with verbal and written material and guidebook.  Patient learns to use a non-linear approach by alternating intensity levels and lengths of time spent  exercising to help burn more calories and lose more body fat. Cardiovascular exercise helps improve heart health, metabolism, hormonal balance, blood sugar control, and recovery from fatigue. Resistance training improves strength, endurance, balance, coordination, reaction time, metabolism, and muscle mass. Flexibility exercise improves circulation, posture, and balance. Seek guidance from your physician and exercise physiologist before implementing an exercise routine and learn your capabilities and proper form for all exercise.  Introduction to Yoga  Clinical staff conducted group or individual video education with verbal and written material and guidebook.  Patient learns about yoga, a discipline of the coming together of mind, breath, and body. The benefits of yoga include improved flexibility, improved range of motion, better posture and core strength, increased lung function, weight loss, and positive self-image. Yoga's heart health benefits include lowered blood pressure, healthier heart rate, decreased cholesterol and triglyceride levels, improved immune function, and reduced stress. Seek guidance from your physician and exercise physiologist before implementing an exercise routine and learn your capabilities and proper form for all exercise.  Medical   Aging: Enhancing Your Quality of Life  Clinical staff conducted group or individual video education with verbal and written material and guidebook.  Patient learns key strategies and recommendations to stay in good physical health and enhance quality of life, such as prevention strategies, having an advocate, securing a Health Care Proxy and Power of Attorney, and keeping a list of medications and system for tracking them. It also discusses how to avoid risk for bone loss.  Biology of Weight Control  Clinical staff conducted group or individual video education with verbal and written material and guidebook.  Patient learns that weight gain occurs  because we consume more calories than we burn (eating more, moving less). Even if your body weight is normal, you may have higher ratios of fat compared to muscle mass. Too much body fat puts you at increased risk for cardiovascular disease, heart attack, stroke, type 2 diabetes, and obesity-related cancers. In addition to exercise, following the Pritikin Eating Plan can help reduce your risk.  Decoding Lab Results  Clinical staff conducted group or individual video education with verbal and written material and guidebook.  Patient learns  that lab test reflects one measurement whose values change over time and are influenced by many factors, including medication, stress, sleep, exercise, food, hydration, pre-existing medical conditions, and more. It is recommended to use the knowledge from this video to become more involved with your lab results and evaluate your numbers to speak with your doctor.   Diseases of Our Time - Overview  Clinical staff conducted group or individual video education with verbal and written material and guidebook.  Patient learns that according to the CDC, 50% to 70% of chronic diseases (such as obesity, type 2 diabetes, elevated lipids, hypertension, and heart disease) are avoidable through lifestyle improvements including healthier food choices, listening to satiety cues, and increased physical activity.  Sleep Disorders Clinical staff conducted group or individual video education with verbal and written material and guidebook.  Patient learns how good quality and duration of sleep are important to overall health and well-being. Patient also learns about sleep disorders and how they impact health along with recommendations to address them, including discussing with a physician.  Nutrition  Dining Out - Part 2 Clinical staff conducted group or individual video education with verbal and written material and guidebook.  Patient learns how to plan ahead and communicate in  order to maximize their dining experience in a healthy and nutritious manner. Included are recommended food choices based on the type of restaurant the patient is visiting.   Fueling a Banker conducted group or individual video education with verbal and written material and guidebook.  There is a strong connection between our food choices and our health. Diseases like obesity and type 2 diabetes are very prevalent and are in large-part due to lifestyle choices. The Pritikin Eating Plan provides plenty of food and hunger-curbing satisfaction. It is easy to follow, affordable, and helps reduce health risks.  Menu Workshop  Clinical staff conducted group or individual video education with verbal and written material and guidebook.  Patient learns that restaurant meals can sabotage health goals because they are often packed with calories, fat, sodium, and sugar. Recommendations include strategies to plan ahead and to communicate with the manager, chef, or server to help order a healthier meal.  Planning Your Eating Strategy  Clinical staff conducted group or individual video education with verbal and written material and guidebook.  Patient learns about the Pritikin Eating Plan and its benefit of reducing the risk of disease. The Pritikin Eating Plan does not focus on calories. Instead, it emphasizes high-quality, nutrient-rich foods. By knowing the characteristics of the foods, we choose, we can determine their calorie density and make informed decisions.  Targeting Your Nutrition Priorities  Clinical staff conducted group or individual video education with verbal and written material and guidebook.  Patient learns that lifestyle habits have a tremendous impact on disease risk and progression. This video provides eating and physical activity recommendations based on your personal health goals, such as reducing LDL cholesterol, losing weight, preventing or controlling type 2  diabetes, and reducing high blood pressure.  Vitamins and Minerals  Clinical staff conducted group or individual video education with verbal and written material and guidebook.  Patient learns different ways to obtain key vitamins and minerals, including through a recommended healthy diet. It is important to discuss all supplements you take with your doctor.   Healthy Mind-Set    Smoking Cessation  Clinical staff conducted group or individual video education with verbal and written material and guidebook.  Patient learns that cigarette smoking and tobacco  addiction pose a serious health risk which affects millions of people. Stopping smoking will significantly reduce the risk of heart disease, lung disease, and many forms of cancer. Recommended strategies for quitting are covered, including working with your doctor to develop a successful plan.  Culinary   Becoming a Set designer conducted group or individual video education with verbal and written material and guidebook.  Patient learns that cooking at home can be healthy, cost-effective, quick, and puts them in control. Keys to cooking healthy recipes will include looking at your recipe, assessing your equipment needs, planning ahead, making it simple, choosing cost-effective seasonal ingredients, and limiting the use of added fats, salts, and sugars.  Cooking - Breakfast and Snacks  Clinical staff conducted group or individual video education with verbal and written material and guidebook.  Patient learns how important breakfast is to satiety and nutrition through the entire day. Recommendations include key foods to eat during breakfast to help stabilize blood sugar levels and to prevent overeating at meals later in the day. Planning ahead is also a key component.  Cooking - Educational psychologist conducted group or individual video education with verbal and written material and guidebook.  Patient learns eating  strategies to improve overall health, including an approach to cook more at home. Recommendations include thinking of animal protein as a side on your plate rather than center stage and focusing instead on lower calorie dense options like vegetables, fruits, whole grains, and plant-based proteins, such as beans. Making sauces in large quantities to freeze for later and leaving the skin on your vegetables are also recommended to maximize your experience.  Cooking - Healthy Salads and Dressing Clinical staff conducted group or individual video education with verbal and written material and guidebook.  Patient learns that vegetables, fruits, whole grains, and legumes are the foundations of the Pritikin Eating Plan. Recommendations include how to incorporate each of these in flavorful and healthy salads, and how to create homemade salad dressings. Proper handling of ingredients is also covered. Cooking - Soups and [employer] - Soups and Desserts Clinical staff conducted group or individual video education with verbal and written material and guidebook.  Patient learns that Pritikin soups and desserts make for easy, nutritious, and delicious snacks and meal components that are low in sodium, fat, sugar, and calorie density, while high in vitamins, minerals, and filling fiber. Recommendations include simple and healthy ideas for soups and desserts.   Overview     The Pritikin Solution Program Overview Clinical staff conducted group or individual video education with verbal and written material and guidebook.  Patient learns that the results of the Pritikin Program have been documented in more than 100 articles published in peer-reviewed journals, and the benefits include reducing risk factors for (and, in some cases, even reversing) high cholesterol, high blood pressure, type 2 diabetes, obesity, and more! An overview of the three key pillars of the Pritikin Program will be covered: eating well, doing  regular exercise, and having a healthy mind-set.  WORKSHOPS  Exercise: Exercise Basics: Building Your Action Plan Clinical staff led group instruction and group discussion with PowerPoint presentation and patient guidebook. To enhance the learning environment the use of posters, models and videos may be added. At the conclusion of this workshop, patients will comprehend the difference between physical activity and exercise, as well as the benefits of incorporating both, into their routine. Patients will understand the FITT (Frequency, Intensity, Time, and Type) principle  and how to use it to build an exercise action plan. In addition, safety concerns and other considerations for exercise and cardiac rehab will be addressed by the presenter. The purpose of this lesson is to promote a comprehensive and effective weekly exercise routine in order to improve patients' overall level of fitness.   Managing Heart Disease: Your Path to a Healthier Heart Clinical staff led group instruction and group discussion with PowerPoint presentation and patient guidebook. To enhance the learning environment the use of posters, models and videos may be added.At the conclusion of this workshop, patients will understand the anatomy and physiology of the heart. Additionally, they will understand how Pritikin's three pillars impact the risk factors, the progression, and the management of heart disease.  The purpose of this lesson is to provide a high-level overview of the heart, heart disease, and how the Pritikin lifestyle positively impacts risk factors.  Exercise Biomechanics Clinical staff led group instruction and group discussion with PowerPoint presentation and patient guidebook. To enhance the learning environment the use of posters, models and videos may be added. Patients will learn how the structural parts of their bodies function and how these functions impact their daily activities, movement, and exercise.  Patients will learn how to promote a neutral spine, learn how to manage pain, and identify ways to improve their physical movement in order to promote healthy living. The purpose of this lesson is to expose patients to common physical limitations that impact physical activity. Participants will learn practical ways to adapt and manage aches and pains, and to minimize their effect on regular exercise. Patients will learn how to maintain good posture while sitting, walking, and lifting.  Balance Training and Fall Prevention  Clinical staff led group instruction and group discussion with PowerPoint presentation and patient guidebook. To enhance the learning environment the use of posters, models and videos may be added. At the conclusion of this workshop, patients will understand the importance of their sensorimotor skills (vision, proprioception, and the vestibular system) in maintaining their ability to balance as they age. Patients will apply a variety of balancing exercises that are appropriate for their current level of function. Patients will understand the common causes for poor balance, possible solutions to these problems, and ways to modify their physical environment in order to minimize their fall risk. The purpose of this lesson is to teach patients about the importance of maintaining balance as they age and ways to minimize their risk of falling.  WORKSHOPS   Nutrition:  Fueling a Ship broker led group instruction and group discussion with PowerPoint presentation and patient guidebook. To enhance the learning environment the use of posters, models and videos may be added. Patients will review the foundational principles of the Pritikin Eating Plan and understand what constitutes a serving size in each of the food groups. Patients will also learn Pritikin-friendly foods that are better choices when away from home and review make-ahead meal and snack options. Calorie  density will be reviewed and applied to three nutrition priorities: weight maintenance, weight loss, and weight gain. The purpose of this lesson is to reinforce (in a group setting) the key concepts around what patients are recommended to eat and how to apply these guidelines when away from home by planning and selecting Pritikin-friendly options. Patients will understand how calorie density may be adjusted for different weight management goals.  Mindful Eating  Clinical staff led group instruction and group discussion with PowerPoint presentation and patient guidebook. To enhance the  learning environment the use of posters, models and videos may be added. Patients will briefly review the concepts of the Pritikin Eating Plan and the importance of low-calorie dense foods. The concept of mindful eating will be introduced as well as the importance of paying attention to internal hunger signals. Triggers for non-hunger eating and techniques for dealing with triggers will be explored. The purpose of this lesson is to provide patients with the opportunity to review the basic principles of the Pritikin Eating Plan, discuss the value of eating mindfully and how to measure internal cues of hunger and fullness using the Hunger Scale. Patients will also discuss reasons for non-hunger eating and learn strategies to use for controlling emotional eating.  Targeting Your Nutrition Priorities Clinical staff led group instruction and group discussion with PowerPoint presentation and patient guidebook. To enhance the learning environment the use of posters, models and videos may be added. Patients will learn how to determine their genetic susceptibility to disease by reviewing their family history. Patients will gain insight into the importance of diet as part of an overall healthy lifestyle in mitigating the impact of genetics and other environmental insults. The purpose of this lesson is to provide patients with the  opportunity to assess their personal nutrition priorities by looking at their family history, their own health history and current risk factors. Patients will also be able to discuss ways of prioritizing and modifying the Pritikin Eating Plan for their highest risk areas  Menu  Clinical staff led group instruction and group discussion with PowerPoint presentation and patient guidebook. To enhance the learning environment the use of posters, models and videos may be added. Using menus brought in from [employer], or printed from [employer], patients will apply the Pritikin dining out guidelines that were presented in the [employer] video. Patients will also be able to practice these guidelines in a variety of provided scenarios. The purpose of this lesson is to provide patients with the opportunity to practice hands-on learning of the Pritikin Dining Out guidelines with actual menus and practice scenarios.  Label Reading Clinical staff led group instruction and group discussion with PowerPoint presentation and patient guidebook. To enhance the learning environment the use of posters, models and videos may be added. Patients will review and discuss the Pritikin label reading guidelines presented in Pritikin's Label Reading Educational series video. Using fool labels brought in from local grocery stores and markets, patients will apply the label reading guidelines and determine if the packaged food meet the Pritikin guidelines. The purpose of this lesson is to provide patients with the opportunity to review, discuss, and practice hands-on learning of the Pritikin Label Reading guidelines with actual packaged food labels. Cooking School  Pritikin's [employer] are designed to teach patients ways to prepare quick, simple, and affordable recipes at home. The importance of nutrition's role in chronic disease risk reduction is reflected in its emphasis in the overall  Pritikin program. By learning how to prepare essential core Pritikin Eating Plan recipes, patients will increase control over what they eat; be able to customize the flavor of foods without the use of added salt, sugar, or fat; and improve the quality of the food they consume. By learning a set of core recipes which are easily assembled, quickly prepared, and affordable, patients are more likely to prepare more healthy foods at home. These workshops focus on convenient breakfasts, simple entres, side dishes, and desserts which can be prepared with minimal effort and are  consistent with nutrition recommendations for cardiovascular risk reduction. Cooking [employer] are taught by a Armed forces logistics/support/administrative officer (RD) who has been trained by the [employer]. The chef or RD has a clear understanding of the importance of minimizing - if not completely eliminating - added fat, sugar, and sodium in recipes. Throughout the series of Cooking School Workshop sessions, patients will learn about healthy ingredients and efficient methods of cooking to build confidence in their capability to prepare    Cooking School weekly topics:  Adding Flavor- Sodium-Free  Fast and Healthy Breakfasts  Powerhouse Plant-Based Proteins  Satisfying Salads and Dressings  Simple Sides and Sauces  International Cuisine-Spotlight on the [employer] Zones  Delicious Desserts  Savory Soups  [employer] - Meals in a Astronomer Appetizers and Snacks  Comforting Weekend Breakfasts  One-Pot Wonders   Fast Evening Meals  Landscape architect Your Pritikin Plate  WORKSHOPS   Healthy Mindset (Psychosocial):  Focused Goals, Sustainable Changes Clinical staff led group instruction and group discussion with PowerPoint presentation and patient guidebook. To enhance the learning environment the use of posters, models and videos may be added. Patients will be able to apply effective goal setting strategies  to establish at least one personal goal, and then take consistent, meaningful action toward that goal. They will learn to identify common barriers to achieving personal goals and develop strategies to overcome them. Patients will also gain an understanding of how our mind-set can impact our ability to achieve goals and the importance of cultivating a positive and growth-oriented mind-set. The purpose of this lesson is to provide patients with a deeper understanding of how to set and achieve personal goals, as well as the tools and strategies needed to overcome common obstacles which may arise along the way.  From Head to Heart: The Power of a Healthy Outlook  Clinical staff led group instruction and group discussion with PowerPoint presentation and patient guidebook. To enhance the learning environment the use of posters, models and videos may be added. Patients will be able to recognize and describe the impact of emotions and mood on physical health. They will discover the importance of self-care and explore self-care practices which may work for them. Patients will also learn how to utilize the 4 C's to cultivate a healthier outlook and better manage stress and challenges. The purpose of this lesson is to demonstrate to patients how a healthy outlook is an essential part of maintaining good health, especially as they continue their cardiac rehab journey.  Healthy Sleep for a Healthy Heart Clinical staff led group instruction and group discussion with PowerPoint presentation and patient guidebook. To enhance the learning environment the use of posters, models and videos may be added. At the conclusion of this workshop, patients will be able to demonstrate knowledge of the importance of sleep to overall health, well-being, and quality of life. They will understand the symptoms of, and treatments for, common sleep disorders. Patients will also be able to identify daytime and nighttime behaviors which impact  sleep, and they will be able to apply these tools to help manage sleep-related challenges. The purpose of this lesson is to provide patients with a general overview of sleep and outline the importance of quality sleep. Patients will learn about a few of the most common sleep disorders. Patients will also be introduced to the concept of "sleep hygiene," and discover ways to self-manage certain sleeping problems through simple daily behavior changes. Finally, the workshop will  motivate patients by clarifying the links between quality sleep and their goals of heart-healthy living.   Recognizing and Reducing Stress Clinical staff led group instruction and group discussion with PowerPoint presentation and patient guidebook. To enhance the learning environment the use of posters, models and videos may be added. At the conclusion of this workshop, patients will be able to understand the types of stress reactions, differentiate between acute and chronic stress, and recognize the impact that chronic stress has on their health. They will also be able to apply different coping mechanisms, such as reframing negative self-talk. Patients will have the opportunity to practice a variety of stress management techniques, such as deep abdominal breathing, progressive muscle relaxation, and/or guided imagery.  The purpose of this lesson is to educate patients on the role of stress in their lives and to provide healthy techniques for coping with it.  Learning Barriers/Preferences:  Learning Barriers/Preferences - 01/19/23 1311       Learning Barriers/Preferences   Learning Barriers None    Learning Preferences Computer/Internet;Group Instruction;Individual Instruction;Pictoral;Skilled Demonstration;Written Material             Education Topics:  Knowledge Questionnaire Score:  Knowledge Questionnaire Score - 01/19/23 1016       Knowledge Questionnaire Score   Pre Score 18/24             Core  Components/Risk Factors/Patient Goals at Admission:  Personal Goals and Risk Factors at Admission - 01/19/23 1012       Core Components/Risk Factors/Patient Goals on Admission    Weight Management Yes    Intervention Weight Management: Develop a combined nutrition and exercise program designed to reach desired caloric intake, while maintaining appropriate intake of nutrient and fiber, sodium and fats, and appropriate energy expenditure required for the weight goal.;Weight Management: Provide education and appropriate resources to help participant work on and attain dietary goals.    Expected Outcomes Short Term: Continue to assess and modify interventions until short term weight is achieved;Long Term: Adherence to nutrition and physical activity/exercise program aimed toward attainment of established weight goal;Understanding recommendations for meals to include 15-35% energy as protein, 25-35% energy from fat, 35-60% energy from carbohydrates, less than 200mg  of dietary cholesterol, 20-35 gm of total fiber daily;Understanding of distribution of calorie intake throughout the day with the consumption of 4-5 meals/snacks    Diabetes Yes    Intervention Provide education about signs/symptoms and action to take for hypo/hyperglycemia.;Provide education about proper nutrition, including hydration, and aerobic/resistive exercise prescription along with prescribed medications to achieve blood glucose in normal ranges: Fasting glucose 65-99 mg/dL    Expected Outcomes Short Term: Participant verbalizes understanding of the signs/symptoms and immediate care of hyper/hypoglycemia, proper foot care and importance of medication, aerobic/resistive exercise and nutrition plan for blood glucose control.;Long Term: Attainment of HbA1C < 7%.    Heart Failure Yes    Intervention Provide a combined exercise and nutrition program that is supplemented with education, support and counseling about heart failure. Directed toward  relieving symptoms such as shortness of breath, decreased exercise tolerance, and extremity edema.    Expected Outcomes Improve functional capacity of life;Short term: Attendance in program 2-3 days a week with increased exercise capacity. Reported lower sodium intake. Reported increased fruit and vegetable intake. Reports medication compliance.;Short term: Daily weights obtained and reported for increase. Utilizing diuretic protocols set by physician.;Long term: Adoption of self-care skills and reduction of barriers for early signs and symptoms recognition and intervention leading to self-care maintenance.  Lipids Yes    Intervention Provide education and support for participant on nutrition & aerobic/resistive exercise along with prescribed medications to achieve LDL 70mg , HDL >40mg .    Expected Outcomes Short Term: Participant states understanding of desired cholesterol values and is compliant with medications prescribed. Participant is following exercise prescription and nutrition guidelines.;Long Term: Cholesterol controlled with medications as prescribed, with individualized exercise RX and with personalized nutrition plan. Value goals: LDL < 70mg , HDL > 40 mg.             Core Components/Risk Factors/Patient Goals Review:   Goals and Risk Factor Review     Row Name 01/28/23 1650 02/01/23 1434 02/25/23 1532 03/24/23 1612       Core Components/Risk Factors/Patient Goals Review   Personal Goals Review Weight Management/Obesity;Diabetes;Hypertension;Lipids;Stress Weight Management/Obesity;Diabetes;Hypertension;Lipids;Stress Weight Management/Obesity;Diabetes;Hypertension;Lipids;Stress Weight Management/Obesity;Diabetes;Hypertension;Lipids;Stress    Review [name] started cardiac rehab on 01/28/23 and did well with exercise. Vital signs and CBG's were stable. Sinus rhythm with frequent PVC's [name] started cardiac rehab on 01/28/23 and is off to a good start to exercise. Vital signs and CBG's  have been  stable. [name] is doing well with exercise at cardiac rehab. Vital signs and CBG's have been  stable. less ventricular ectopy has been noted since coreg dose has been increased. [name] is doing  very well with exercise at cardiac rehab. Vital signs and CBG's have remain stable. less ventricular ectopy has been noted since coreg dose has been increased. [name] will complete cardiac rehab on 04/08/23    Expected Outcomes [name] will continue to participate in cardiac rehab for exercise, nutrition and lifestyle modifications [name] will continue to participate in cardiac rehab for exercise, nutrition and lifestyle modifications [name] will continue to participate in cardiac rehab for exercise, nutrition and lifestyle modifications [name] will continue to participate in cardiac rehab for exercise, nutrition and lifestyle modifications             Core Components/Risk Factors/Patient Goals at Discharge (Final Review):   Goals and Risk Factor Review - 03/24/23 1612       Core Components/Risk Factors/Patient Goals Review   Personal Goals Review Weight Management/Obesity;Diabetes;Hypertension;Lipids;Stress    Review [name] is doing  very well with exercise at cardiac rehab. Vital signs and CBG's have remain stable. less ventricular ectopy has been noted since coreg dose has been increased. [name] will complete cardiac rehab on 04/08/23    Expected Outcomes [name] will continue to participate in cardiac rehab for exercise, nutrition and lifestyle modifications             ITP Comments:  ITP Comments     Row Name 01/19/23 1011 01/28/23 1646 02/01/23 1433 02/25/23 1529 03/24/23 1611   ITP Comments Dr. Armanda Magic medical director. Introduction to pritikin education/ intensive cardiac rehab. Initial orientation packet reviewed with patient. 30 Day ITP. [name] started cardiac rehab on 01/28/23 and did well with exercise 30 Day ITP. [name] started cardiac rehab on 01/28/23 and is off to a good start to exercise 30  Day ITP. [name] has good attendance and participation in  cardiac rehab 30 Day ITP. [name] continues to have  good attendance and participation in cardiac rehab            Comments: See ITP Comments

## 2023-03-30 ENCOUNTER — Encounter (HOSPITAL_COMMUNITY)
Admission: RE | Admit: 2023-03-30 | Discharge: 2023-03-30 | Disposition: A | Discharge status: Home / Self Care | Payer: Medicare Other | Source: Ambulatory Visit | Attending: Cardiology

## 2023-03-30 DIAGNOSIS — I5022 Chronic systolic (congestive) heart failure: Secondary | ICD-10-CM | POA: Diagnosis not present

## 2023-04-01 ENCOUNTER — Encounter (HOSPITAL_COMMUNITY)
Admission: RE | Admit: 2023-04-01 | Discharge: 2023-04-01 | Disposition: A | Discharge status: Home / Self Care | Payer: Medicare Other | Source: Ambulatory Visit | Attending: Cardiology

## 2023-04-01 DIAGNOSIS — I5022 Chronic systolic (congestive) heart failure: Secondary | ICD-10-CM

## 2023-04-04 ENCOUNTER — Encounter (HOSPITAL_COMMUNITY)
Admission: RE | Admit: 2023-04-04 | Discharge: 2023-04-04 | Disposition: A | Discharge status: Home / Self Care | Payer: Medicare Other | Source: Ambulatory Visit | Attending: Cardiology

## 2023-04-04 DIAGNOSIS — I5022 Chronic systolic (congestive) heart failure: Secondary | ICD-10-CM

## 2023-04-06 ENCOUNTER — Encounter (HOSPITAL_COMMUNITY)
Admission: RE | Admit: 2023-04-06 | Discharge: 2023-04-06 | Disposition: A | Discharge status: Home / Self Care | Payer: Medicare Other | Source: Ambulatory Visit | Attending: Cardiology | Admitting: Cardiology

## 2023-04-06 DIAGNOSIS — I5022 Chronic systolic (congestive) heart failure: Secondary | ICD-10-CM | POA: Diagnosis present

## 2023-04-08 ENCOUNTER — Encounter (HOSPITAL_COMMUNITY)
Admission: RE | Admit: 2023-04-08 | Discharge: 2023-04-08 | Disposition: A | Discharge status: Home / Self Care | Payer: Medicare Other | Source: Ambulatory Visit | Attending: Cardiology | Admitting: Cardiology

## 2023-04-08 DIAGNOSIS — I5022 Chronic systolic (congestive) heart failure: Secondary | ICD-10-CM

## 2023-04-11 ENCOUNTER — Encounter (HOSPITAL_COMMUNITY)
Admission: RE | Admit: 2023-04-11 | Discharge: 2023-04-11 | Disposition: A | Discharge status: Home / Self Care | Payer: Medicare Other | Source: Ambulatory Visit | Attending: Cardiology

## 2023-04-11 DIAGNOSIS — I5022 Chronic systolic (congestive) heart failure: Secondary | ICD-10-CM

## 2023-04-13 ENCOUNTER — Encounter (HOSPITAL_COMMUNITY)
Admission: RE | Admit: 2023-04-13 | Discharge: 2023-04-13 | Disposition: A | Discharge status: Home / Self Care | Payer: Medicare Other | Source: Ambulatory Visit | Attending: Cardiology | Admitting: Cardiology

## 2023-04-13 VITALS — BP 102/64 | HR 65 | Wt 222.7 lb

## 2023-04-13 DIAGNOSIS — I5022 Chronic systolic (congestive) heart failure: Secondary | ICD-10-CM

## 2023-04-14 NOTE — Progress Notes (Signed)
Discharge Progress Report  Patient Details  Name: [name]. MRN: [id number] Date of Birth: 1955-08-21 Referring Provider:   Flowsheet Row INTENSIVE CARDIAC REHAB ORIENT from 01/19/2023 in [facility]  Referring Provider Dorthula Nettles, MD        Number of Visits: 56  Reason for Discharge:  Patient reached a stable level of exercise. Patient independent in their exercise. Patient has met program and personal goals.  Smoking History:  Social History   Tobacco Use  Smoking Status Former   Current packs/day: 0.00   Types: Cigarettes   Quit date: 05/05/2002   Years since quitting: 20.9   Passive exposure: Never  Smokeless Tobacco Never    Diagnosis:  Heart failure, chronic systolic (HCC)  ADL [facility]:   Initial Exercise Prescription:  Initial Exercise Prescription - 01/19/23 1300       Date of Initial Exercise RX and Referring Provider   Date 01/19/23    Referring Provider Dorthula Nettles, MD    Expected Discharge Date 04/13/23      Recumbant Bike   Level 2    RPM 60    Watts 30    Minutes 15    METs 2.7      Arm Ergometer   Level 1    Watts 20    RPM 50    Minutes 15    METs 2.5      Prescription Details   Frequency (times per week) 3    Duration Progress to 30 minutes of continuous aerobic without signs/symptoms of physical distress      Intensity   THRR 40-80% of Max Heartrate 61-121    Ratings of Perceived Exertion 11-13    Perceived Dyspnea 0-4      Progression   Progression Continue progressive overload as per policy without signs/symptoms or physical distress.      Resistance Training   Training Prescription Yes    Weight 3    Reps 10-15             Discharge Exercise Prescription (Final Exercise Prescription Changes):  Exercise Prescription Changes - 04/13/23 1032       Response to Exercise   Blood Pressure (Admit) 102/64    Blood Pressure (Exit) 100/58     Heart Rate (Admit) 65 bpm    Heart Rate (Exercise) 94 bpm    Heart Rate (Exit) 69 bpm    Rating of Perceived Exertion (Exercise) 11    Symptoms None    Comments [name] completed the cardiac rehab program today.    Duration Continue with 30 min of aerobic exercise without signs/symptoms of physical distress.    Intensity THRR unchanged      Progression   Progression Continue to progress workloads to maintain intensity without signs/symptoms of physical distress.    Average METs 3.8      Resistance Training   Training Prescription No   Relaxation day, no weights     Interval Training   Interval Training No      Recumbant Bike   Level 4    RPM 60    Watts 45    Minutes 15    METs 4.1      Arm Ergometer   Level 4    Minutes 15    METs 3.6      Home Exercise Plan   Plans to continue exercise at Home (comment)   Walking   Frequency Add 2 additional  days to program exercise sessions.    Initial Home Exercises Provided 02/07/23             Functional Capacity:  6 Minute Walk     Row Name 01/19/23 1307 04/06/23 1046       6 Minute Walk   Phase Initial Discharge    Distance 1440 feet 1570 feet    Distance % Change -- 9.03 %    Distance Feet Change -- 130 ft    Walk Time 6 minutes 6 minutes    # of Rest Breaks 0 0    MPH 2.73 2.97    METS 3.01 3.24    RPE 9 11.5    Perceived Dyspnea  0 0    VO2 Peak 10.55 11.34    Symptoms No Yes (comment)    Comments -- [name] c/o left calf tightness.    Resting HR 64 bpm 66 bpm    Resting BP 102/62 110/54    Resting Oxygen Saturation  97 % --    Exercise Oxygen Saturation  during 6 min walk 98 % --    Max Ex. HR 91 bpm 92 bpm    Max Ex. BP 124/70 122/52    2 Minute Post BP 110/66 118/60             Psychological, QOL, Others - Outcomes: PHQ 2/9:    04/06/2023    3:57 PM 01/19/2023    1:05 PM 11/23/2022    3:04 PM 07/06/2022    8:51 AM 07/03/2021   11:34 AM  Depression screen PHQ 2/9  Decreased Interest 0 0 0 0 0   Down, Depressed, Hopeless 0 0 0 0 0  PHQ - 2 Score 0 0 0 0 0  Altered sleeping 0 0 0    Tired, decreased energy 1 0 0    Change in appetite 1 0 0    Feeling bad or failure about yourself  0 0 0    Trouble concentrating 0 0 0    Moving slowly or fidgety/restless 0 0 0    Suicidal thoughts 0 0 0    PHQ-9 Score 2 0 0    Difficult doing work/chores Not difficult at all  Not difficult at all      Quality of Life:  Quality of Life - 04/06/23 1556       Quality of Life   Select Quality of Life      Quality of Life Scores   Health/Function Pre 24.7 %    Health/Function Post 27.73 %    Health/Function % Change 12.27 %    Socioeconomic Pre 27.69 %    Socioeconomic Post 28.93 %    Socioeconomic % Change  4.48 %    Psych/Spiritual Pre 27.86 %    Psych/Spiritual Post 28.43 %    Psych/Spiritual % Change 2.05 %    Family Pre 30 %    Family Post 30 %    Family % Change 0 %    GLOBAL Pre 26.77 %    GLOBAL Post 28.46 %    GLOBAL % Change 6.31 %             Personal Goals: Goals established at orientation with interventions provided to work toward goal.  Personal Goals and Risk Factors at Admission - 01/19/23 1012       Core Components/Risk Factors/Patient Goals on Admission    Weight Management Yes    Intervention Weight Management: Develop a combined nutrition and  exercise program designed to reach desired caloric intake, while maintaining appropriate intake of nutrient and fiber, sodium and fats, and appropriate energy expenditure required for the weight goal.;Weight Management: Provide education and appropriate resources to help participant work on and attain dietary goals.    Expected Outcomes Short Term: Continue to assess and modify interventions until short term weight is achieved;Long Term: Adherence to nutrition and physical activity/exercise program aimed toward attainment of established weight goal;Understanding recommendations for meals to include 15-35% energy as  protein, 25-35% energy from fat, 35-60% energy from carbohydrates, less than 200mg  of dietary cholesterol, 20-35 gm of total fiber daily;Understanding of distribution of calorie intake throughout the day with the consumption of 4-5 meals/snacks    Diabetes Yes    Intervention Provide education about signs/symptoms and action to take for hypo/hyperglycemia.;Provide education about proper nutrition, including hydration, and aerobic/resistive exercise prescription along with prescribed medications to achieve blood glucose in normal ranges: Fasting glucose 65-99 mg/dL    Expected Outcomes Short Term: Participant verbalizes understanding of the signs/symptoms and immediate care of hyper/hypoglycemia, proper foot care and importance of medication, aerobic/resistive exercise and nutrition plan for blood glucose control.;Long Term: Attainment of HbA1C < 7%.    Heart Failure Yes    Intervention Provide a combined exercise and nutrition program that is supplemented with education, support and counseling about heart failure. Directed toward relieving symptoms such as shortness of breath, decreased exercise tolerance, and extremity edema.    Expected Outcomes Improve functional capacity of life;Short term: Attendance in program 2-3 days a week with increased exercise capacity. Reported lower sodium intake. Reported increased fruit and vegetable intake. Reports medication compliance.;Short term: Daily weights obtained and reported for increase. Utilizing diuretic protocols set by physician.;Long term: Adoption of self-care skills and reduction of barriers for early signs and symptoms recognition and intervention leading to self-care maintenance.    Lipids Yes    Intervention Provide education and support for participant on nutrition & aerobic/resistive exercise along with prescribed medications to achieve LDL 70mg , HDL >40mg .    Expected Outcomes Short Term: Participant states understanding of desired cholesterol  values and is compliant with medications prescribed. Participant is following exercise prescription and nutrition guidelines.;Long Term: Cholesterol controlled with medications as prescribed, with individualized exercise RX and with personalized nutrition plan. Value goals: LDL < 70mg , HDL > 40 mg.              Personal Goals Discharge:  Goals and Risk Factor Review     Row Name 01/28/23 1650 02/01/23 1434 02/25/23 1532 03/24/23 1612       Core Components/Risk Factors/Patient Goals Review   Personal Goals Review Weight Management/Obesity;Diabetes;Hypertension;Lipids;Stress Weight Management/Obesity;Diabetes;Hypertension;Lipids;Stress Weight Management/Obesity;Diabetes;Hypertension;Lipids;Stress Weight Management/Obesity;Diabetes;Hypertension;Lipids;Stress    Review [name] started cardiac rehab on 01/28/23 and did well with exercise. Vital signs and CBG's were stable. Sinus rhythm with frequent PVC's [name] started cardiac rehab on 01/28/23 and is off to a good start to exercise. Vital signs and CBG's have been  stable. [name] is doing well with exercise at cardiac rehab. Vital signs and CBG's have been  stable. less ventricular ectopy has been noted since coreg dose has been increased. [name] is doing  very well with exercise at cardiac rehab. Vital signs and CBG's have remain stable. less ventricular ectopy has been noted since coreg dose has been increased. [name] will complete cardiac rehab on 04/08/23    Expected Outcomes [name] will continue to participate in cardiac rehab for exercise, nutrition and lifestyle modifications [name] will continue to  participate in cardiac rehab for exercise, nutrition and lifestyle modifications [name] will continue to participate in cardiac rehab for exercise, nutrition and lifestyle modifications [name] will continue to participate in cardiac rehab for exercise, nutrition and lifestyle modifications             Exercise Goals and Review:  Exercise Goals     Row  Name 01/19/23 1012             Exercise Goals   Increase Physical Activity Yes       Intervention Provide advice, education, support and counseling about physical activity/exercise needs.;Develop an individualized exercise prescription for aerobic and resistive training based on initial evaluation findings, risk stratification, comorbidities and participant's personal goals.       Expected Outcomes Short Term: Attend rehab on a regular basis to increase amount of physical activity.;Long Term: Add in home exercise to make exercise part of routine and to increase amount of physical activity.;Long Term: Exercising regularly at least 3-5 days a week.       Increase Strength and Stamina Yes       Intervention Provide advice, education, support and counseling about physical activity/exercise needs.;Develop an individualized exercise prescription for aerobic and resistive training based on initial evaluation findings, risk stratification, comorbidities and participant's personal goals.       Expected Outcomes Short Term: Increase workloads from initial exercise prescription for resistance, speed, and METs.;Short Term: Perform resistance training exercises routinely during rehab and add in resistance training at home;Long Term: Improve cardiorespiratory fitness, muscular endurance and strength as measured by increased METs and functional capacity ( )       Able to understand and use rate of perceived exertion (RPE) scale Yes       Intervention Provide education and explanation on how to use RPE scale       Expected Outcomes Short Term: Able to use RPE daily in rehab to express subjective intensity level;Long Term:  Able to use RPE to guide intensity level when exercising independently       Knowledge and understanding of Target Heart Rate Range (THRR) Yes       Intervention Provide education and explanation of THRR including how the numbers were predicted and where they are located for reference        Expected Outcomes Short Term: Able to state/look up THRR;Short Term: Able to use daily as guideline for intensity in rehab;Long Term: Able to use THRR to govern intensity when exercising independently       Understanding of Exercise Prescription Yes       Intervention Provide education, explanation, and written materials on patient's individual exercise prescription       Expected Outcomes Short Term: Able to explain program exercise prescription;Long Term: Able to explain home exercise prescription to exercise independently                Exercise Goals Re-Evaluation:  Exercise Goals Re-Evaluation     Row Name 01/28/23 1130 02/07/23 1044 03/02/23 0959 04/04/23 1033 04/13/23 1134     Exercise Goal Re-Evaluation   Exercise Goals Review Increase Physical Activity;Increase Strength and Stamina;Able to understand and use rate of perceived exertion (RPE) scale Increase Physical Activity;Increase Strength and Stamina;Able to understand and use rate of perceived exertion (RPE) scale;Knowledge and understanding of Target Heart Rate Range (THRR);Understanding of Exercise Prescription Increase Physical Activity;Increase Strength and Stamina;Able to understand and use rate of perceived exertion (RPE) scale;Knowledge and understanding of Target Heart Rate Range (THRR);Understanding of Exercise Prescription  Increase Physical Activity;Increase Strength and Stamina;Able to understand and use rate of perceived exertion (RPE) scale;Knowledge and understanding of Target Heart Rate Range (THRR);Understanding of Exercise Prescription Increase Physical Activity;Increase Strength and Stamina;Able to understand and use rate of perceived exertion (RPE) scale;Knowledge and understanding of Target Heart Rate Range (THRR);Understanding of Exercise Prescription   Comments [name] was able to understand and use RPE scale appropriately. Reviewed exercise prescription with [name]. He does some stretches for his back currently. He  plans to walk as his mode of home exercise. He also has access to a pool and may incorporate swimming into his home exercise routine. [name] has a variety of hand weights at home that he uses for his resistance training. Encouraged him to add some walking to his routine for his aerobic exercise. He is trying to eat better as one of his goals. [name] is scheduled to complete cardiac rehab next week and is progressing well. 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 [name] completed the cardiac rehab program and progressed well achieving 3.8 METs with exercise. [facility] and continue weight training with a Systems analyst.   Expected Outcomes Progress workloads as tolerated to help increase cardiorespiratory fitness. [name] will walk 30 minutes 2-3 days/week in addition to exercise at cardiac rehab to achieve 150 minutes of aerobic exercise/week. [name] will add some walking to his home exercise routine. [name] will [facility] in [facility] to strength training with personal trainer upon completion of the cardiac rehab program. [name] will continue [facility] [facility] in [facility] to strength training with personal trainer to maintain health and fitness gains.            Nutrition & Weight - Outcomes:  Pre Biometrics - 01/19/23 1306       Pre Biometrics   Waist Circumference 48 inches    Hip Circumference 44 inches    Waist to Hip Ratio 1.09 %    Triceps Skinfold 28 mm    % Body Fat 34.5 %    Grip Strength 34 kg    Flexibility 11.25 in    Single Leg Stand 30 seconds             Post Biometrics - 04/06/23 1055        Post  Biometrics   Waist Circumference 43.25 inches    Hip Circumference 42.25 inches    Waist to Hip Ratio 1.02 %    Triceps Skinfold 20 mm    Grip Strength 37  kg    Flexibility 12.75 in    Single Leg Stand 19.06 seconds             Nutrition:  Nutrition Therapy & Goals - 03/31/23 1452       Nutrition Therapy   Diet Heart healthy/carbohydrate consistent diet    Drug/Food Interactions Statins/Certain Fruits      Personal Nutrition Goals   Nutrition Goal Patient to identify strategies for reducing cardiovascular risk by attending the Prit[employer]in education and nutrition series weekly.   goal in action.   Personal Goal #2 Patient to improve diet quality by using the plate method as a guide for meal planning to include lean protein/plant protein, fruits, vegetables, whole grains, nonfat dairy as part of a well-balanced diet.  goal in action.   Personal Goal #3 Patient to identify food sources and limit daily intake of saturated fat, trans fat, sodium, and refined carbohydrates.   goal in action.   Comments Goals in action. [name] continues to attend the [employer] education and nutrition series regularly. [name] reports that he and his wife have started making some changes including switched to non-nutrititve sweeteners, implemented some Pritikin recipes, and increased dietary fiber. He reports taking ozempic to aid with cardiovascular risk and blood sugar control. He reports am/fasting blood sugars 120-130s. He continues to work on reducing saturated fat intake. He is awaiting approval of Repatha. He has maintained his weight since starting with our program. Patient will benefit from participation in intensive cardiac rehab for nutrition, exercise, and lifestyle modification.      Intervention Plan   Intervention Prescribe, educate and counsel regarding individualized specific dietary modifications aiming towards targeted core components such as weight, hypertension, lipid management, diabetes, heart failure and other comorbidities.;Nutrition handout(s) given to patient.    Expected Outcomes Short Term Goal: Understand basic principles of dietary content, such  as calories, fat, sodium, cholesterol and nutrients.;Long Term Goal: Adherence to prescribed nutrition plan.             Nutrition Discharge:  Nutrition Assessments - 04/08/23 1430       Rate Your Plate Scores   Pre Score --    Post Score 68             Education Questionnaire Score:  Knowledge Questionnaire Score - 04/06/23 1556       Knowledge Questionnaire Score   Pre Score 18/24    Post Score 23/24             Goals reviewed with patient; copy given to patient.Pt graduates from  Intensive/Traditional cardiac rehab program on 04/13/23  with completion of 22  exercise and  30 education sessions. Pt maintained good attendance and progressed nicely during their participation in rehab as evidenced by increased MET level. Tollie, "[name]" increased his distance on his post exercise walk test by 130 feet.   Medication list reconciled. Repeat  PHQ score- 2  .  Pt has made significant lifestyle changes and should be commended for their success. [name]  achieved their goals during cardiac rehab.   Pt plans to continue exercise at the [facility]/ silver sneakers and will work with a Systems analyst. We are proud of [name]'s  progress! Lucas Headings RN BSN

## 2023-05-05 ENCOUNTER — Ambulatory Visit (INDEPENDENT_AMBULATORY_CARE_PROVIDER_SITE_OTHER): Payer: No Typology Code available for payment source

## 2023-05-05 DIAGNOSIS — I4901 Ventricular fibrillation: Secondary | ICD-10-CM

## 2023-05-05 DIAGNOSIS — I469 Cardiac arrest, cause unspecified: Secondary | ICD-10-CM | POA: Diagnosis not present

## 2023-05-05 LAB — CUP PACEART REMOTE DEVICE CHECK
Battery Remaining Longevity: 164 mo
Battery Voltage: 3.06 V
Brady Statistic RA Percent Paced: INVALID
Brady Statistic RV Percent Paced: 0.01 %
Date Time Interrogation Session: 20241030205038
HighPow Impedance: 66 Ohm
Implantable Lead Connection Status: 753985
Implantable Lead Implant Date: 20240501
Implantable Lead Location: 753860
Implantable Pulse Generator Implant Date: 20240501
Lead Channel Impedance Value: 342 Ohm
Lead Channel Impedance Value: 418 Ohm
Lead Channel Pacing Threshold Amplitude: 0.875 V
Lead Channel Pacing Threshold Pulse Width: 0.4 ms
Lead Channel Sensing Intrinsic Amplitude: 20.8 mV
Lead Channel Setting Pacing Amplitude: 2 V
Lead Channel Setting Pacing Pulse Width: 0.4 ms
Lead Channel Setting Sensing Sensitivity: 0.3 mV
Zone Setting Status: 755011

## 2023-05-23 ENCOUNTER — Encounter: Payer: Self-pay | Admitting: Vascular Surgery

## 2023-05-23 ENCOUNTER — Telehealth (HOSPITAL_COMMUNITY): Payer: Self-pay | Admitting: Cardiology

## 2023-05-23 NOTE — Telephone Encounter (Signed)
Pt called to report pain in R shoulder and tingling in two fingers on the right Pt felt it is related to new med- repatha   Received two doses   Advised would forward to provider/pharmacy team however should also contact pcp in the event this is not related to or side effect of  repatha

## 2023-05-23 NOTE — Progress Notes (Signed)
Remote ICD transmission.   

## 2023-05-23 NOTE — Telephone Encounter (Signed)
Called patient, discussed the R hand pain and numbness in the first 2 fingers. He will be going to PCP for further assessment tomorrow on May 24, 2023. Will follow up with [place] on NOV 20 or 21.    Can hold Repatha for next couple doses to see pain is truly from Repatha.

## 2023-05-23 NOTE — Telephone Encounter (Signed)
Forwarding to [facility] as she has been involved with repatha initiation in [facility].

## 2023-05-24 ENCOUNTER — Ambulatory Visit (INDEPENDENT_AMBULATORY_CARE_PROVIDER_SITE_OTHER): Payer: Medicare Other

## 2023-05-24 ENCOUNTER — Encounter: Payer: Self-pay | Admitting: Internal Medicine

## 2023-05-24 ENCOUNTER — Ambulatory Visit: Payer: Medicare Other | Admitting: Internal Medicine

## 2023-05-24 VITALS — BP 110/60 | HR 63 | Temp 98.0°F | Ht 72.0 in | Wt 215.0 lb

## 2023-05-24 DIAGNOSIS — M5412 Radiculopathy, cervical region: Secondary | ICD-10-CM

## 2023-05-24 DIAGNOSIS — E118 Type 2 diabetes mellitus with unspecified complications: Secondary | ICD-10-CM

## 2023-05-24 DIAGNOSIS — I2510 Atherosclerotic heart disease of native coronary artery without angina pectoris: Secondary | ICD-10-CM | POA: Diagnosis not present

## 2023-05-24 MED ORDER — METHYLPREDNISOLONE 4 MG PO TBPK: ORAL_TABLET | ORAL | 0 refills | Status: DC

## 2023-05-24 MED ORDER — HYDROCODONE-ACETAMINOPHEN 5-325 MG PO TABS: 1.0000 | ORAL_TABLET | Freq: Four times a day (QID) | ORAL | 0 refills | Status: DC | PRN

## 2023-05-24 NOTE — Progress Notes (Unsigned)
Subjective:  Patient ID: Lucas Sharper., male    DOB: May 23, 1956  Age: 67 y.o. MRN: [id number]  CC: Arm Pain (Right arm/shoulder pain x1 week. Notes pain travels across the arm and shoulder as well as some tingling/numbness in the fingers. Treating with tramadol) and Shoulder Pain   HPI [name]. presents for moderate to severe R arm muscle pain primarily located in the forearm and arm rated at 7-8/10 x 5 d C/o tingling R hand fingers #2-3 No injury  Outpatient Medications Prior to Visit  Medication Sig Dispense Refill   aspirin 81 MG chewable tablet Chew 81 mg by mouth daily.     b complex vitamins capsule Take 1 capsule by mouth daily.     carvedilol (COREG) 25 MG tablet Take 1 tablet (25 mg total) by mouth 2 (two) times daily with a meal. 180 tablet 3   Cholecalciferol (VITAMIN D3) 50 MCG (2000 UT) TABS Take 2,000 Units by mouth daily.     diclofenac Sodium (VOLTAREN) 1 % GEL Apply 2 g topically daily as needed (pain).     empagliflozin (JARDIANCE) 10 MG TABS tablet Take 1 tablet (10 mg total) by mouth daily. 30 tablet 3   Evolocumab (REPATHA SURECLICK) 140 MG/ML SOAJ Inject 140 mg into the skin every 14 (fourteen) days. 6 mL 3   magnesium oxide (MAG-OX) 400 (240 Mg) MG tablet Take 400 mg by mouth at bedtime.     pantoprazole (PROTONIX) 40 MG tablet TAKE 1 TABLET (40 MG TOTAL) BY MOUTH DAILY. 90 tablet 0   rosuvastatin (CRESTOR) 20 MG tablet Take 1 tablet (20 mg total) by mouth daily. 90 tablet 3   sacubitril-valsartan (ENTRESTO) 49-51 MG Take 1 tablet by mouth 2 (two) times daily. 60 tablet 11   Semaglutide, 1 MG/DOSE, 4 MG/3ML SOPN Inject 1 mg into the skin once a week.     spironolactone (ALDACTONE) 25 MG tablet Take 25 mg by mouth daily.      No facility-administered medications prior to visit.    ROS: Review of Systems  Constitutional:  Negative for appetite change, fatigue and unexpected weight change.  HENT:  Negative for congestion, nosebleeds, sneezing, sore  throat and trouble swallowing.   Eyes:  Negative for itching and visual disturbance.  Respiratory:  Negative for cough, chest tightness and shortness of breath.   Cardiovascular:  Negative for chest pain, palpitations and leg swelling.  Gastrointestinal:  Negative for abdominal distention, blood in stool, diarrhea and nausea.  Genitourinary:  Negative for frequency and hematuria.  Musculoskeletal:  Positive for arthralgias. Negative for back pain, gait problem, joint swelling, neck pain and neck stiffness.  Skin:  Negative for rash.  Neurological:  Negative for dizziness, tremors, speech difficulty and weakness.  Psychiatric/Behavioral:  Negative for agitation, dysphoric mood and sleep disturbance. The patient is not nervous/anxious.     Objective:  BP 110/60   Pulse 63   Temp 98 F (36.7 C) (Oral)   Ht 6' (1.829 m)   Wt 215 lb (97.5 kg)   SpO2 99%   BMI 29.16 kg/m   BP Readings from Last 3 Encounters:  05/24/23 110/60  04/13/23 102/64  03/22/23 116/62    Wt Readings from Last 3 Encounters:  05/24/23 215 lb (97.5 kg)  04/13/23 222 lb 10.6 oz (101 kg)  03/22/23 221 lb 3.2 oz (100.3 kg)    Physical Exam Constitutional:      General: He is not in acute distress.  Appearance: He is well-developed.     Comments: NAD  Eyes:     Conjunctiva/sclera: Conjunctivae normal.     Pupils: Pupils are equal, round, and reactive to light.  Neck:     Thyroid: No thyromegaly.     Vascular: No JVD.  Cardiovascular:     Rate and Rhythm: Normal rate and regular rhythm.     Heart sounds: Normal heart sounds. No murmur heard.    No friction rub. No gallop.  Pulmonary:     Effort: Pulmonary effort is normal. No respiratory distress.     Breath sounds: Normal breath sounds. No wheezing or rales.  Chest:     Chest wall: No tenderness.  Abdominal:     General: Bowel sounds are normal. There is no distension.     Palpations: Abdomen is soft. There is no mass.     Tenderness: There is no  abdominal tenderness. There is no guarding or rebound.  Musculoskeletal:        General: No tenderness. Normal range of motion.     Cervical back: Normal range of motion.  Lymphadenopathy:     Cervical: No cervical adenopathy.  Skin:    General: Skin is warm and dry.     Findings: No rash.  Neurological:     Mental Status: He is alert and oriented to person, place, and time.     Cranial Nerves: No cranial nerve deficit.     Motor: No abnormal muscle tone.     Coordination: Coordination normal.     Gait: Gait normal.     Deep Tendon Reflexes: Reflexes are normal and symmetric.  Psychiatric:        Behavior: Behavior normal.        Thought Content: Thought content normal.        Judgment: Judgment normal.    Right forearm is painful to palpation over the lateral aspect Muscle strength in the hand seems to be intact  Cervical spine slightly stiff but good range of motion   Lab Results  Component Value Date   WBC 7.0 11/23/2022   HGB 14.7 11/23/2022   HCT 44.3 11/23/2022   PLT 185.0 11/23/2022   GLUCOSE 103 (H) 03/22/2023   CHOL 133 12/08/2022   TRIG 155 (H) 12/08/2022   HDL 34 (L) 12/08/2022   LDLDIRECT 198.1 02/08/2013   LDLCALC 68 12/08/2022   ALT 23 11/23/2022   AST 17 11/23/2022   NA 139 03/22/2023   K 4.3 03/22/2023   CL 100 03/22/2023   CREATININE 1.01 03/22/2023   BUN 13 03/22/2023   CO2 26 03/22/2023   TSH 1.70 09/24/2015   PSA 0.80 02/08/2013   INR 1.0 11/17/2022   HGBA1C 6.5 (H) 10/28/2022    No results found.  Assessment & Plan:   Problem List Items Addressed This Visit     Type 2 diabetes with complication (HCC)    We discussed the effect of steroids on diabetes management      CAD (coronary artery disease)    No chest pain/angina Continue on Coreg , Micardis, Aldactone, ASA,  Crestor       Cervical radiculopathy - Primary    Cervical radiculopathy symptoms on the right primarily in C6-C7 segment Medrol DosePack Norco as needed   Potential benefits of a short term opioids use as well as potential risks (i.e. addiction risk, apnea etc) and complications (i.e. Somnolence, constipation and others) were explained to the patient and were aknowledged. Heat C-spine x-ray Return to  clinic in 4 weeks.  Call if worse       Relevant Orders   DG Cervical Spine Complete (Completed)      Meds ordered this encounter  Medications   methylPREDNISolone (MEDROL DOSEPAK) 4 MG TBPK tablet    Sig: As directed    Dispense:  21 tablet    Refill:  0   HYDROcodone-acetaminophen (NORCO/VICODIN) 5-325 MG tablet    Sig: Take 1 tablet by mouth every 6 (six) hours as needed for severe pain (pain score 7-10).    Dispense:  20 tablet    Refill:  0      Follow-up: Return in about 4 weeks (around 06/21/2023) for a follow-up visit.  Sonda Primes, MD

## 2023-05-25 ENCOUNTER — Encounter: Payer: Self-pay | Admitting: Internal Medicine

## 2023-05-25 NOTE — Assessment & Plan Note (Signed)
We discussed the effect of steroids on diabetes management

## 2023-05-25 NOTE — Assessment & Plan Note (Signed)
No chest pain/angina Continue on Coreg , Micardis, Aldactone, ASA,  Crestor

## 2023-05-25 NOTE — Assessment & Plan Note (Signed)
Cervical radiculopathy symptoms on the right primarily in C6-C7 segment Medrol DosePack Norco as needed  Potential benefits of a short term opioids use as well as potential risks (i.e. addiction risk, apnea etc) and complications (i.e. Somnolence, constipation and others) were explained to the patient and were aknowledged. Heat C-spine x-ray Return to clinic in 4 weeks.  Call if worse

## 2023-05-26 ENCOUNTER — Telehealth: Payer: Self-pay | Admitting: Pharmacist

## 2023-05-26 NOTE — Telephone Encounter (Signed)
Received VM that patient had results from apt he wanted to share. Looks like he has been following with [place]

## 2023-05-30 NOTE — Telephone Encounter (Signed)
Patient had a problem with his arm, he first thought it was Repatha or Crestor but he went to PCP and found out that he had pinched nerve. Will continue taking Repatha and Crestor for now and follow up fasting lipid lab is due on Jan 27,2025.

## 2023-06-21 ENCOUNTER — Ambulatory Visit: Payer: Medicare Other

## 2023-06-21 DIAGNOSIS — E785 Hyperlipidemia, unspecified: Secondary | ICD-10-CM

## 2023-06-27 ENCOUNTER — Telehealth: Payer: Self-pay | Admitting: Cardiovascular Disease

## 2023-06-27 NOTE — Telephone Encounter (Signed)
Returned call to patient. The below is in his chart. Tylene Fantasia, [facility]    05/30/23  8:23 AM Note Patient had a problem with his arm, he first thought it was Repatha or Crestor but he went to PCP and found out that he had pinched nerve. Will continue taking Repatha and Crestor for now and follow up fasting lipid lab is due on Jan 27,2025.     Advised pt that the labs weren't due to be drawn until late next month. He states he'll be seeing his PCP early next month and will be having his cholesterol checked with them as part of his annual. He will see that they send [place] copies of their results.

## 2023-06-27 NOTE — Telephone Encounter (Signed)
  Patient states he tried to have labs done and was told there were no orders. I see orders for Lipid panel. Please make sure orders are released.

## 2023-07-14 ENCOUNTER — Encounter (HOSPITAL_COMMUNITY): Payer: Medicare Other

## 2023-07-19 LAB — LIPID PANEL
Chol/HDL Ratio: 2.3 {ratio} (ref 0.0–5.0)
Cholesterol, Total: 97 mg/dL — ABNORMAL LOW (ref 100–199)
HDL: 42 mg/dL (ref >39)
LDL Chol Calc (NIH): 25 mg/dL (ref 0–99)
Triglycerides: 192 mg/dL — ABNORMAL HIGH (ref 0–149)
VLDL Cholesterol Cal: 30 mg/dL (ref 5–40)

## 2023-07-20 ENCOUNTER — Ambulatory Visit: Payer: Medicare Other

## 2023-07-20 VITALS — BP 100/60 | HR 70 | Ht 72.0 in | Wt 221.6 lb

## 2023-07-20 DIAGNOSIS — Z0000 Encounter for general adult medical examination without abnormal findings: Secondary | ICD-10-CM

## 2023-07-20 DIAGNOSIS — E118 Type 2 diabetes mellitus with unspecified complications: Secondary | ICD-10-CM | POA: Diagnosis not present

## 2023-07-20 NOTE — Progress Notes (Addendum)
Subjective:   Lucas Acres. is a 68 y.o. male who presents for Medicare Annual/Subsequent preventive examination.  Visit Complete: In person  Patient Medicare AWV questionnaire was completed by the patient on 07/16/23; I have confirmed that all information answered by patient is correct and no changes since this date.  Cardiac Risk Factors include: male gender;hypertension;advanced age (>51men, >64 women);Other (see comment);diabetes mellitus;dyslipidemia, Risk factor comments: TIA, CAD, PAD,HFrEF, Aortic valve sclerosis     Objective:    Today's Vitals   07/20/23 1619  BP: 100/60  Pulse: 70  SpO2: 98%  Weight: 221 lb 9.6 oz (100.5 kg)  Height: 6' (1.829 m)   Body mass index is 30.05 kg/m.     07/20/2023    3:13 PM 11/17/2022    9:25 AM 11/02/2022    9:00 PM 07/06/2022    9:01 AM 07/03/2021   11:28 AM 04/29/2021    1:00 PM 04/28/2021    9:10 AM  Advanced Directives  Does Patient Have a Medical Advance Directive? Yes No No Yes Yes Yes Yes  Type of Estate agent of [place];Living will   Healthcare Power of [place];Living will Healthcare Power of [place];Living will Healthcare Power of [place];Living will Healthcare Power of [place];Living will  Does patient want to make changes to medical advance directive?      No - Patient declined No - Patient declined  Copy of Healthcare Power of Attorney in Chart? No - copy requested   No - copy requested No - copy requested No - copy requested No - copy requested  Would patient like information on creating a medical advance directive?  No - Patient declined No - Patient declined        Current Medications (verified) Outpatient Encounter Medications as of 07/20/2023  Medication Sig   albuterol (VENTOLIN HFA) 108 (90 Base) MCG/ACT inhaler Inhale into the lungs.   aspirin 81 MG chewable tablet Chew 81 mg by mouth daily.   b complex vitamins capsule Take 1 capsule by mouth daily.   carvedilol (COREG) 25 MG  tablet Take 1 tablet (25 mg total) by mouth 2 (two) times daily with a meal.   Cholecalciferol (VITAMIN D3) 50 MCG (2000 UT) TABS Take 2,000 Units by mouth daily.   diclofenac Sodium (VOLTAREN) 1 % GEL Apply 2 g topically daily as needed (pain).   doxycycline (VIBRA-TABS) 100 MG tablet Take 100 mg by mouth 2 (two) times daily.   empagliflozin (JARDIANCE) 10 MG TABS tablet Take 1 tablet (10 mg total) by mouth daily.   Evolocumab (REPATHA SURECLICK) 140 MG/ML SOAJ Inject 140 mg into the skin every 14 (fourteen) days.   magnesium oxide (MAG-OX) 400 (240 Mg) MG tablet Take 400 mg by mouth at bedtime.   pantoprazole (PROTONIX) 40 MG tablet TAKE 1 TABLET (40 MG TOTAL) BY MOUTH DAILY.   rosuvastatin (CRESTOR) 20 MG tablet Take 1 tablet (20 mg total) by mouth daily.   sacubitril-valsartan (ENTRESTO) 49-51 MG Take 1 tablet by mouth 2 (two) times daily.   Semaglutide, 1 MG/DOSE, 4 MG/3ML SOPN Inject 1 mg into the skin once a week.   spironolactone (ALDACTONE) 25 MG tablet Take 25 mg by mouth daily.    HYDROcodone-acetaminophen (NORCO/VICODIN) 5-325 MG tablet Take 1 tablet by mouth every 6 (six) hours as needed for severe pain (pain score 7-10).   methylPREDNISolone (MEDROL DOSEPAK) 4 MG TBPK tablet As directed (Patient not taking: Reported on 07/20/2023)   No facility-administered encounter medications on file as  of 07/20/2023.    Allergies (verified) Patient has no known allergies.   History: Past Medical History:  Diagnosis Date   AICD (automatic cardioverter/defibrillator) present 11/03/2022   Aortic valve sclerosis    aortic valve sclerosis   Arthritis    CAD (coronary artery disease)    Myoview 5/18: 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 no ischemia EF 27%   Carotid artery disease (HCC)    chronic total occlusion LCCA, retrograde flow from LECA to LICA, ,, 0-39%LICA, s/p Right CEA 2018   CHF (congestive heart failure) (HCC)    Closed head injury with concussion    x2    Complication of anesthesia    Low blood pressure after colonoscopy   Diabetes mellitus without complication (HCC)    Dizziness    Ejection fraction < 50%    EF 40%, echo 2006, no ICD needed  / EF 40%, echo, 11/2009   Former cigarette smoker    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 Georganna Skeans   Sleep disorder    Dr Shelle Iron, lose weight and consider sleep study later   Sleep disorder    TIA (transient ischemic attack)    by history   Volume overload    Past Surgical History:  Procedure Laterality Date   CHOLECYSTECTOMY     COLONOSCOPY  03/19/2009   CORONARY ARTERY BYPASS GRAFT     ENDARTERECTOMY Right 11/05/2016   Procedure: ENDARTERECTOMY RIGHT CAROTID;  Surgeon: Larina Earthly, MD;  Location: [facility] OR;  Service: Vascular;  Laterality: Right;   ENDARTERECTOMY FEMORAL Right 04/29/2021   Procedure: RIGHT ILIOFEMORAL ARTERY ENDARTERECTOMY WITH PROFUNDOPLASTY;  Surgeon: Leonie Douglas, MD;  Location: [facility] OR;  Service: Vascular;  Laterality: Right;   ICD IMPLANT N/A 11/03/2022   Procedure: ICD IMPLANT;  Surgeon: Regan Lemming, MD;  Location: [facility] INVASIVE CV LAB;  Service: Cardiovascular;  Laterality: N/A;   INSERTION OF ILIAC STENT Right 04/29/2021   Procedure: AORTAGRAM INSERTION OF BILATERAL COMMON ILIAC ARTERY KISSING STENTS;  Surgeon: Leonie Douglas, MD;  Location: [facility] OR;  Service: Vascular;  Laterality: Right;   PATCH ANGIOPLASTY Right 11/05/2016   Procedure: RIGHT CAROTID ARTERY PATCH ANGIOPLASTY USING HEMASHIELD PLATINUM FINESSE PATCH;  Surgeon: Larina Earthly, MD;  Location: [facility] OR;  Service: Vascular;  Laterality: Right;   PATCH ANGIOPLASTY Right 04/29/2021   Procedure: PATCH ANGIOPLASTY OF RIGHT COMMON FEMORAL ARTERY UISNG XENOSURE BOVINE PERICARDIUM PACTH;  Surgeon: Leonie Douglas,  MD;  Location: [facility] OR;  Service: Vascular;  Laterality: Right;   RIGHT/LEFT HEART CATH AND CORONARY/GRAFT ANGIOGRAPHY N/A 10/29/2022   Procedure: RIGHT/LEFT HEART CATH AND CORONARY/GRAFT ANGIOGRAPHY;  Surgeon: Tonny Bollman, MD;  Location: [facility] INVASIVE CV LAB;  Service: Cardiovascular;  Laterality: N/A;   SHOULDER ARTHROSCOPY Right 05/25/2018   Procedure: RIGHT ARTHROSCOPY SHOULDER LABRAL DEBRIDEMENT, rotator cuff debridement,  BICEPS TENOTOMY and subacromial decompression;  Surgeon: Jones Broom, MD;  Location: [facility] OR;  Service: Orthopedics;  Laterality: Right;   TRANSCAROTID ARTERY REVASCULARIZATION  Right 10/28/2022   Procedure: Right Transcarotid Artery Revascularization;  Surgeon: Leonie Douglas, MD;  Location: [facility] OR;  Service: Vascular;  Laterality: Right;   TRANSCAROTID ARTERY REVASCULARIZATION  Right 11/17/2022  Procedure: Right Transcarotid Artery Revascularization;  Surgeon: Leonie Douglas, MD;  Location: [facility] OR;  Service: Vascular;  Laterality: Right;   ULTRASOUND GUIDANCE FOR VASCULAR ACCESS Left 04/29/2021   Procedure: ULTRASOUND GUIDANCE FOR VASCULAR ACCESS, LEFT FEMORAL ARTERY;  Surgeon: Leonie Douglas, MD;  Location: [facility] OR;  Service: Vascular;  Laterality: Left;   Family History  Problem Relation Age of Onset   Stroke Mother    Diabetes Father    COPD Father    Arthritis Father    Heart disease Father    Coronary artery disease Other        Male 1st degree relative <50   Diabetes Other        1st degree relative   Stroke Other        Male 1st degree relative<50   Deep vein thrombosis Daughter    Pulmonary fibrosis Daughter    Social History   Socioeconomic History   Marital status: Married    Spouse name: Clydie Braun   Number of children: 2   Years of education: Not on file   Highest education level: Associate degree: occupational, Scientist, product/process development, or vocational program  Occupational History   Occupation: Retired  Tobacco Use   Smoking status: Former    Current  packs/day: 0.00    Types: Cigarettes    Quit date: 05/05/2002    Years since quitting: 21.2    Passive exposure: Never   Smokeless tobacco: Never  Vaping Use   Vaping status: Never Used  Substance and Sexual Activity   Alcohol use: Yes    Alcohol/week: 0.0 standard drinks of alcohol    Comment: beer every now and then   Drug use: No   Sexual activity: Yes  Other Topics Concern   Not on file  Social History Narrative   No Regular exercise   Lives with wife-2025   Social Drivers of Health   Financial Resource Strain: Low Risk  (07/20/2023)   Overall Financial Resource Strain (CARDIA)    Difficulty of Paying Living Expenses: Not hard at all  Food Insecurity: No Food Insecurity (07/20/2023)   Hunger Vital Sign    Worried About Running Out of Food in the Last Year: Never true    Ran Out of Food in the Last Year: Never true  Transportation Needs: No Transportation Needs (07/20/2023)   PRAPARE - Administrator, Civil Service (Medical): No    Lack of Transportation (Non-Medical): No  Physical Activity: Inactive (07/20/2023)   Exercise Vital Sign    Days of Exercise per Week: 0 days    Minutes of Exercise per Session: 0 min  Stress: No Stress Concern Present (07/20/2023)   [employer] of Occupational Health - Occupational Stress Questionnaire    Feeling of Stress : Not at all  Social Connections: Socially Integrated (07/20/2023)   Social Connection and Isolation Panel [NHANES]    Frequency of Communication with Friends and Family: More than three times a week    Frequency of Social Gatherings with Friends and Family: Once a week    Attends Religious Services: More than 4 times per year    Active Member of [employer] or Organizations: Yes    Attends Engineer, structural: More than 4 times per year    Marital Status: Married    Tobacco Counseling Counseling given: Not Answered   Clinical Intake:  Pre-visit preparation completed: Yes  Pain : No/denies  pain     BMI - recorded: 30.05 Nutritional Status: BMI  25 -29 Overweight Nutritional Risks: None Diabetes: Yes CBG done?: No Did pt. bring in CBG monitor from home?: No  How often do you need to have someone help you when you read instructions, pamphlets, or other written materials from your doctor or pharmacy?: 1 - Never  Interpreter Needed?: No  Information entered by :: Antonae Zbikowski, RMA   Activities of Daily Living    07/16/2023    2:02 PM 11/17/2022    8:57 AM  In your present state of health, do you have any difficulty performing the following activities:  Hearing? 1 0  Vision? 0 0  Difficulty concentrating or making decisions? 0 0  Walking or climbing stairs? 0 0  Dressing or bathing? 0 0  Doing errands, shopping? 0 0  Preparing Food and eating ? N   Using the Toilet? N   In the past six months, have you accidently leaked urine? N   Do you have problems with loss of bowel control? N   Managing your Medications? N   Managing your Finances? N   Housekeeping or managing your Housekeeping? N     Patient Care Team: Plotnikov, Georgina Quint, MD as PCP - General Nahser, Deloris Ping, MD as PCP - Cardiology (Cardiology) Regan Lemming, MD as PCP - Electrophysiology (Cardiology) Romero Belling, MD (Inactive) as Consulting Physician (Endocrinology)  Indicate any recent Medical Services you may have received from other than [facility] providers in the past year (date may be approximate).     Assessment:   This is a routine wellness examination for [name].  Hearing/Vision screen Hearing Screening - Comments:: Wears hearing aides Vision Screening - Comments:: Wears eyeglasses   Goals Addressed             This Visit's Progress    My goal for 2024 to to keep on moving on.   On track     Depression Screen    07/20/2023    3:17 PM 05/24/2023    3:43 PM 04/06/2023    3:57 PM 01/19/2023    1:05 PM 11/23/2022    3:04 PM 07/06/2022    8:51 AM 07/03/2021   11:34 AM  PHQ  2/9 Scores  PHQ - 2 Score 0 0 0 0 0 0 0  PHQ- 9 Score 0  2 0 0      Fall Risk    07/16/2023    2:02 PM 05/24/2023    3:42 PM 04/13/2023   10:41 AM 04/11/2023   10:12 AM 04/08/2023   10:07 AM  Fall Risk   Falls in the past year? 1 0 0 0 0  Number falls in past yr: 0 0 0 0 0  Injury with Fall? 0 0 0 0 0  Risk for fall due to :  No Fall Risks No Fall Risks No Fall Risks No Fall Risks  Follow up  Falls evaluation completed Falls evaluation completed Falls evaluation completed Falls evaluation completed    MEDICARE RISK AT HOME: Medicare Risk at Home Any stairs in or around the home?: (Patient-Rptd) Yes If so, are there any without handrails?: (Patient-Rptd) No Home free of loose throw rugs in walkways, pet beds, electrical cords, etc?: (Patient-Rptd) Yes Adequate lighting in your home to reduce risk of falls?: (Patient-Rptd) Yes Life alert?: (Patient-Rptd) No Use of a cane, walker or w/c?: (Patient-Rptd) No Grab bars in the bathroom?: (Patient-Rptd) Yes Shower chair or bench in shower?: (Patient-Rptd) Yes Elevated toilet seat or a handicapped toilet?: (Patient-Rptd) Yes  TIMED  UP AND GO:  Was the test performed?  Yes  Length of time to ambulate 10 feet: 12 sec Gait steady and fast without use of assistive device    Cognitive Function:        07/20/2023    3:01 PM 07/06/2022    9:03 AM  6CIT Screen  What Year? 0 points 0 points  What month? 0 points 0 points  What time? 0 points 0 points  Count back from 20 0 points 0 points  Months in reverse 2 points 0 points  Repeat phrase 0 points 0 points  Total Score 2 points 0 points    Immunizations Immunization History  Administered Date(s) Administered   Influenza Split 06/10/2018   Influenza Whole 03/23/2010, 04/14/2012   Influenza, High Dose Seasonal PF 03/05/2021   Influenza,inj,Quad PF,6+ Mos 05/04/2013, 04/04/2015, 06/17/2018, 02/23/2019, 02/23/2019   Influenza-Unspecified 05/12/2016, 04/20/2017, 02/04/2019,  03/05/2021, 03/24/2022   PFIZER(Purple Top)SARS-COV-2 Vaccination 08/30/2019, 09/25/2019, 04/24/2020   Pneumococcal Conjugate-13 08/13/2013   Pneumococcal Polysaccharide-23 02/09/2013, 07/17/2019   Td 11/27/2008   Tdap 07/05/2014   Zoster Recombinant(Shingrix) 03/10/2017, 08/17/2017    TDAP status: Up to date  Flu Vaccine status: Up to date  Pneumococcal vaccine status: Up to date  Covid-19 vaccine status: Declined, Education has been provided regarding the importance of this vaccine but patient still declined. Advised may receive this vaccine at local pharmacy or Health Dept.or vaccine clinic. Aware to provide a copy of the vaccination record if obtained from local pharmacy or Health Dept. Verbalized acceptance and understanding.  Qualifies for Shingles Vaccine? Yes   Zostavax completed Yes   Shingrix Completed?: Yes  Screening Tests Health Maintenance  Topic Date Due   OPHTHALMOLOGY EXAM  Never done   Diabetic kidney evaluation - Urine ACR  Never done   Colonoscopy  03/20/2019   HEMOGLOBIN A1C  04/29/2023   COVID-19 Vaccine (4 - 2024-25 season) 08/05/2023 (Originally 03/06/2023)   FOOT EXAM  01/02/2024 (Originally 04/16/2022)   Hepatitis C Screening  01/02/2024 (Originally 08/13/1973)   Diabetic kidney evaluation - eGFR measurement  03/21/2024   DTaP/Tdap/Td (3 - Td or Tdap) 07/05/2024   Pneumonia Vaccine 2+ Years old (3 of 3 - PPSV23 or PCV20) 07/16/2024   Medicare Annual Wellness (AWV)  07/19/2024   INFLUENZA VACCINE  Completed   Zoster Vaccines- Shingrix  Completed   HPV VACCINES  Aged Out    Health Maintenance  Health Maintenance Due  Topic Date Due   OPHTHALMOLOGY EXAM  Never done   Diabetic kidney evaluation - Urine ACR  Never done   Colonoscopy  03/20/2019   HEMOGLOBIN A1C  04/29/2023     Lung Cancer Screening: (Low Dose CT Chest recommended if Age 16-80 years, 20 pack-year currently smoking OR have quit w/in 15years.) does not qualify.   Lung Cancer  Screening Referral: N/C  Additional Screening:  Hepatitis C Screening: does qualify;  Vision Screening: Recommended annual ophthalmology exams for early detection of glaucoma and other disorders of the eye. Is the patient up to date with their annual eye exam?  Yes  Who is the provider or what is the name of the office in which the patient attends annual eye exams[facility] [place], [place] If pt is not established with a provider, would they like to be referred to a provider to establish care? No .   Dental Screening: Recommended annual dental exams for proper oral hygiene  Diabetic Foot Exam: Diabetic Foot Exam: Overdue, Pt has been advised about the  importance in completing this exam. Pt is scheduled for diabetic foot exam on Next office visit.  Community Resource Referral / Chronic Care Management: CRR required this visit?  No   CCM required this visit?  No     Plan:     I have personally reviewed and noted the following in the patient's chart:   Medical and social history Use of alcohol, tobacco or illicit drugs  Current medications and supplements including opioid prescriptions. Patient is not currently taking opioid prescriptions. Functional ability and status Nutritional status Physical activity Advanced directives List of other physicians Hospitalizations, surgeries, and ER visits in previous 12 months Vitals Screenings to include cognitive, depression, and falls Referrals and appointments  In addition, I have reviewed and discussed with patient certain preventive protocols, quality metrics, and best practice recommendations. A written personalized care plan for preventive services as well as general preventive health recommendations were provided to patient.     [name] L Aine Strycharz, CMA   07/20/2023   After Visit Summary: (MyChart) Due to this being a telephonic visit, the after visit summary with patients personalized plan was offered to patient via MyChart    Nurse Notes: Patient is due for a colonoscopy, a eye exam and a A1C, however patient stated that he has had an colonoscopy given by the [facility] hospital and also an eye exam given by them as well. I have sent records to receive records today.  Medical screening examination/treatment/procedure(s) were performed by non-physician practitioner and as supervising physician I was immediately available for consultation/collaboration.  I agree with above. Jacinta Shoe, MD

## 2023-07-20 NOTE — Patient Instructions (Addendum)
 [name] , Thank you for taking time to come for your Medicare Wellness Visit. I appreciate your ongoing commitment to your health goals. Please review the following plan we discussed and let me know if I can assist you in the future.   Referrals/Orders/Follow-Ups/Clinician Recommendations: It was nice to meet you today.  You are due for a foot exam and an A1C check.  Aim for 30 minutes of exercise or brisk walking, 6-8 glasses of water , and 5 servings of fruits and vegetables each day.   This is a list of the screening recommended for you and due dates:  Health Maintenance  Topic Date Due   Eye exam for diabetics  Never done   Yearly kidney health urinalysis for diabetes  Never done   Colon Cancer Screening  03/20/2019   Hemoglobin A1C  04/29/2023   COVID-19 Vaccine (4 - 2024-25 season) 08/05/2023*   Complete foot exam   01/02/2024*   Hepatitis C Screening  01/02/2024*   Yearly kidney function blood test for diabetes  03/21/2024   DTaP/Tdap/Td vaccine (3 - Td or Tdap) 07/05/2024   Pneumonia Vaccine (3 of 3 - PPSV23 or PCV20) 07/16/2024   Medicare Annual Wellness Visit  07/19/2024   Flu Shot  Completed   Zoster (Shingles) Vaccine  Completed   HPV Vaccine  Aged Out  *Topic was postponed. The date shown is not the original due date.    Advanced directives: (Copy Requested) Please bring a copy of your health care power of attorney and living will to the office to be added to your chart at your convenience.  Next Medicare Annual Wellness Visit scheduled for next year: Yes

## 2023-07-21 ENCOUNTER — Encounter (HOSPITAL_COMMUNITY): Payer: Medicare Other

## 2023-07-21 ENCOUNTER — Encounter (HOSPITAL_COMMUNITY): Payer: Self-pay

## 2023-07-25 NOTE — Progress Notes (Signed)
ADVANCED HEART FAILURE CLINIC NOTE  Referring Physician: Tresa Garter, MD  Primary Care: Tresa Garter, MD Primary Cardiologist:  HPI: [name]. is a 68 y.o. male with CAD status post CABG x 4 in July 2003, chronic systolic heart failure, carotid artery disease status post right CEA and patch angioplasty in May 2018, PAD status post right iliofemoral endarterectomy and stenting of bilateral common iliac arteries in October 2022, hypertension, hyperlipidemia, type 2 diabetes, history of TIA and VT arrest presenting today to establish care.  He was most recently hospitalized in April 2024 after he suffered VT arrest during sedation/intubation for planned right transcarotid artery revascularization.  Echocardiogram shortly after with LVEF less than 20% and moderately reduced RV function.  He underwent right and left heart catheterization that showed total occlusion of the proximal LAD, CTO of the distal circumflex, subtotal occlusion of the proximal and mid circumflex and CTO of the RCA with patency of the RIMA to LAD and free LIMA to diagonal; occlusion of SVG to OM 2 and SVG to PDA.  Cardiac index at that time of 2.1.  He had a follow-up cardiac MRI with LVEF of 23% and underwent secondary mention ICD.  He was also started on amiodarone for a brief duration.  Interval hx: - Has been doing very well since our last appointment. ICD was placed in May 2024 by Dr. Elberta Fortis.He does not feel any functional limitations at this time from a HFrEF standpoint.    Activity level/exercise tolerance:  NYHA II Orthopnea:  Sleeps on 2 pillows Paroxysmal noctural dyspnea:  No Chest pain/pressure:  No Orthostatic lightheadedness:  No Palpitations:  No Lower extremity edema:  No Presyncope/syncope:  No Cough:  No  Past Medical History:  Diagnosis Date   AICD (automatic cardioverter/defibrillator) present 11/03/2022   Aortic valve sclerosis    aortic valve sclerosis   Arthritis     CAD (coronary artery disease)    Myoview 5/18: 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 no ischemia EF 27%   Carotid artery disease (HCC)    chronic total occlusion LCCA, retrograde flow from LECA to LICA, ,, 0-39%LICA, s/p Right CEA 2018   CHF (congestive heart failure) (HCC)    Closed head injury with concussion    x2   Complication of anesthesia    Low blood pressure after colonoscopy   Diabetes mellitus without complication (HCC)    Dizziness    Ejection fraction < 50%    EF 40%, echo 2006, no ICD needed  / EF 40%, echo, 11/2009   Former cigarette smoker    Hx of CABG 01/2002   01/2002   Hyperlipidemia    Low HDL   Hypertension    Mitral regurgitation    mild, echo, 11/2009   Myocardial infarction [facility])    "they told me i had a silent heart attack"   Peripheral vascular disease (HCC)    Dr. Georganna Skeans   Sleep disorder    Dr Shelle Iron, lose weight and consider sleep study later   Sleep disorder    TIA (transient ischemic attack)    by history   Volume overload     Current Outpatient Medications  Medication Sig Dispense Refill   albuterol (VENTOLIN HFA) 108 (90 Base) MCG/ACT inhaler Inhale into the lungs.     aspirin 81 MG chewable tablet Chew 81 mg by mouth daily.     b complex vitamins capsule Take 1 capsule by mouth daily.  carvedilol (COREG) 25 MG tablet Take 1 tablet (25 mg total) by mouth 2 (two) times daily with a meal. 180 tablet 3   Cholecalciferol (VITAMIN D3) 50 MCG (2000 UT) TABS Take 2,000 Units by mouth daily.     diclofenac Sodium (VOLTAREN) 1 % GEL Apply 2 g topically daily as needed (pain).     doxycycline (VIBRA-TABS) 100 MG tablet Take 100 mg by mouth 2 (two) times daily.     empagliflozin (JARDIANCE) 10 MG TABS tablet Take 1 tablet (10 mg total) by mouth daily. 30 tablet 3   Evolocumab (REPATHA SURECLICK) 140 MG/ML SOAJ Inject 140 mg into the skin every 14 (fourteen) days. 6 mL 3   HYDROcodone-acetaminophen (NORCO/VICODIN) 5-325 MG tablet  Take 1 tablet by mouth every 6 (six) hours as needed for severe pain (pain score 7-10). 20 tablet 0   magnesium oxide (MAG-OX) 400 (240 Mg) MG tablet Take 400 mg by mouth at bedtime.     methylPREDNISolone (MEDROL DOSEPAK) 4 MG TBPK tablet As directed (Patient not taking: Reported on 07/20/2023) 21 tablet 0   pantoprazole (PROTONIX) 40 MG tablet TAKE 1 TABLET (40 MG TOTAL) BY MOUTH DAILY. 90 tablet 0   rosuvastatin (CRESTOR) 20 MG tablet Take 1 tablet (20 mg total) by mouth daily. 90 tablet 3   sacubitril-valsartan (ENTRESTO) 49-51 MG Take 1 tablet by mouth 2 (two) times daily. 60 tablet 11   Semaglutide, 1 MG/DOSE, 4 MG/3ML SOPN Inject 1 mg into the skin once a week.     spironolactone (ALDACTONE) 25 MG tablet Take 25 mg by mouth daily.      No current facility-administered medications for this visit.    No Known Allergies    Social History   Socioeconomic History   Marital status: Married    Spouse name: Clydie Braun   Number of children: 2   Years of education: Not on file   Highest education level: Associate degree: occupational, Scientist, product/process development, or vocational program  Occupational History   Occupation: Retired  Tobacco Use   Smoking status: Former    Current packs/day: 0.00    Types: Cigarettes    Quit date: 05/05/2002    Years since quitting: 21.2    Passive exposure: Never   Smokeless tobacco: Never  Vaping Use   Vaping status: Never Used  Substance and Sexual Activity   Alcohol use: Yes    Alcohol/week: 0.0 standard drinks of alcohol    Comment: beer every now and then   Drug use: No   Sexual activity: Yes  Other Topics Concern   Not on file  Social History Narrative   No Regular exercise   Lives with wife-2025   Social Drivers of Health   Financial Resource Strain: Low Risk  (07/20/2023)   Overall Financial Resource Strain (CARDIA)    Difficulty of Paying Living Expenses: Not hard at all  Food Insecurity: No Food Insecurity (07/20/2023)   Hunger Vital Sign    Worried  About Running Out of Food in the Last Year: Never true    Ran Out of Food in the Last Year: Never true  Transportation Needs: No Transportation Needs (07/20/2023)   PRAPARE - Administrator, Civil Service (Medical): No    Lack of Transportation (Non-Medical): No  Physical Activity: Inactive (07/20/2023)   Exercise Vital Sign    Days of Exercise per Week: 0 days    Minutes of Exercise per Session: 0 min  Stress: No Stress Concern Present (07/20/2023)   [place]  Institute of Occupational Health - Occupational Stress Questionnaire    Feeling of Stress : Not at all  Social Connections: Socially Integrated (07/20/2023)   Social Connection and Isolation Panel [NHANES]    Frequency of Communication with Friends and Family: More than three times a week    Frequency of Social Gatherings with Friends and Family: Once a week    Attends Religious Services: More than 4 times per year    Active Member of [employer] or Organizations: Yes    Attends Engineer, structural: More than 4 times per year    Marital Status: Married  Catering manager Violence: Not At Risk (07/20/2023)   Humiliation, Afraid, Rape, and Kick questionnaire    Fear of Current or Ex-Partner: No    Emotionally Abused: No    Physically Abused: No    Sexually Abused: No      Family History  Problem Relation Age of Onset   Stroke Mother    Diabetes Father    COPD Father    Arthritis Father    Heart disease Father    Coronary artery disease Other        Male 1st degree relative <50   Diabetes Other        1st degree relative   Stroke Other        Male 1st degree relative<50   Deep vein thrombosis Daughter    Pulmonary fibrosis Daughter     PHYSICAL EXAM: There were no vitals filed for this visit. GENERAL: Well nourished, well developed, and in no apparent distress at rest.  HEENT: There is no scleral icterus.  The mucous membranes are pink and moist.   CHEST: There are no chest wall deformities. There is no  chest wall tenderness. Respirations are unlabored.  Lungs- *** CARDIAC:  JVP: *** cm          Normal rate with regular rhythm. *** murmur.  Pulses are 2+ and symmetrical in upper and lower extremities. *** edema.  ABDOMEN: Soft, non-tender, non-distended. There are normal bowel sounds.  EXTREMITIES: Warm and well perfused.  NEUROLOGIC: Patient is oriented x3 with no obvious focal neurologic deficits.  PSYCH: Patients affect is appropriate SKIN: Warm and dry; no lesions or wounds.      DATA REVIEW  ECG: 12/08/22: NSR  As per my personal interpretation  ECHO: 01/05/23: LVEF 25%-30% TTE: Post arrest (4/24): EF < 20%, RV moderately down TTE: (2015): 30-35%  CATH: R/LHC (4/24): severe native vessel coronary artery disease with total occlusion of the proximal LAD, subtotal occlusion of the proximal and mid circumflex, total occlusion of the distal circumflex, and total occlusion of the midportion of the nondominant RCA; s/p aortocoronary bypass surgery with continued patency of the RIMA to LAD and free LIMA to diagonal, total occlusion of the saphenous vein graft to OM 2 and saphenous vein graft to left PDA.  CO/CI 4.75/2.12, mild pulm htn with mean PA 27, PVR 2.5 WU. mRA 9, mPCWP 15   CMR (4/24):  LVEF 23%, RVEF 54%, LGE consistent with infarct in basal to mid inferior wall and basal to apical lateral wall.   ASSESSMENT & PLAN:  Heart failure with reduced ejection fraction Etiology of HF: Ischemic cardiomyopathy, left heart cath from 4/24 noted above with severe multivessel native coronary artery disease and now occlusion of the saphenous vein grafts.  Cardiac MRI with a EF of 23% and severe LGE in the basal to mid inferior wall NYHA class / AHA Stage:II Volume status &  Diuretics: Euvolemic; taking lasix PRN.  Vasodilators: Entresto 49/51mg  BID Beta-Blocker: Coreg 6.25 mg twice daily, limited by bradycardia. MRA: Spironolactone 25 mg daily Cardiometabolic: Jardiance 10 mg Devices  therapies & Valvulopathies: Secondary prevention ICD placed in April 2024 Advanced therapies: His severe LE PAD may limit options. Cardiac index of 2.1 in 4/24; Repeat TTE today (01/05/23) w/ LVEF of 30%. Improved from prior but I still suspect he may require VAD evaluation in the future. Will plan for CPX in 3-70m.   2.  Coronary artery disease status post CABG - s/p CABG x4 (LIMA as a free graft to the diagonal, RIMA to LAD, SVG to OM 2, and SVG to PDA) in 01/2002 with Dr. Dorris Fetch  - [facility] 4/24 showed severe native vessel coronary artery disease with total occlusion of the proximal LAD and subtotal occlusion of the proximal and mid circumflex, total occlusion of the distal circumflex, and total occlusion of the midportion of the nondominant RCA, continued patency of the RIMA to LAD and free LIMA to diagonal.  Total occlusion of the saphenous vein graft to OM 2 and saphenous vein graft to left PDA. No targets for intervention  - stable w/o CP ; D/C ISDN - continue medical management  3. VT Arrest - followed by Dr. Elberta Fortis; likely due to underlying ischemic heart disease with occluded SVG during intubation/sedation.   4. 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 Aborted attempt at rt trans carotid revascularization 10/29/22 per above  - right TCAR 11/18/22 by Dr. Lenell Antu - on ASA, statin  4. T2DM - A1C of 6.5 - Jardiance 10 - Ozempic  5. HLD - Repeat lipid panel with LDL of 68.  - Will refer to lipid clinic for PCSK9i evaluation; he has a very high burden of atherosclerotic disease which could limit future therapies (ie transplant/LVAD).   [name] Advanced Heart Failure Mechanical Circulatory Support

## 2023-07-26 ENCOUNTER — Encounter (HOSPITAL_COMMUNITY): Payer: Self-pay | Admitting: Cardiology

## 2023-07-26 ENCOUNTER — Ambulatory Visit (HOSPITAL_COMMUNITY)
Admission: RE | Admit: 2023-07-26 | Discharge: 2023-07-26 | Disposition: A | Discharge status: Home / Self Care | Payer: Medicare Other | Source: Ambulatory Visit | Attending: Cardiology | Admitting: Cardiology

## 2023-07-26 VITALS — BP 98/58 | HR 61 | Ht 72.0 in | Wt 225.2 lb

## 2023-07-26 DIAGNOSIS — Z79899 Other long term (current) drug therapy: Secondary | ICD-10-CM | POA: Diagnosis not present

## 2023-07-26 DIAGNOSIS — Z8673 Personal history of transient ischemic attack (TIA), and cerebral infarction without residual deficits: Secondary | ICD-10-CM | POA: Diagnosis not present

## 2023-07-26 DIAGNOSIS — E1151 Type 2 diabetes mellitus with diabetic peripheral angiopathy without gangrene: Secondary | ICD-10-CM | POA: Diagnosis not present

## 2023-07-26 DIAGNOSIS — I2510 Atherosclerotic heart disease of native coronary artery without angina pectoris: Secondary | ICD-10-CM | POA: Diagnosis not present

## 2023-07-26 DIAGNOSIS — I5022 Chronic systolic (congestive) heart failure: Secondary | ICD-10-CM | POA: Diagnosis not present

## 2023-07-26 DIAGNOSIS — I6521 Occlusion and stenosis of right carotid artery: Secondary | ICD-10-CM | POA: Diagnosis not present

## 2023-07-26 DIAGNOSIS — I25810 Atherosclerosis of coronary artery bypass graft(s) without angina pectoris: Secondary | ICD-10-CM | POA: Diagnosis not present

## 2023-07-26 DIAGNOSIS — I110 Hypertensive heart disease with heart failure: Secondary | ICD-10-CM | POA: Diagnosis present

## 2023-07-26 DIAGNOSIS — I472 Ventricular tachycardia, unspecified: Secondary | ICD-10-CM | POA: Insufficient documentation

## 2023-07-26 DIAGNOSIS — E785 Hyperlipidemia, unspecified: Secondary | ICD-10-CM | POA: Insufficient documentation

## 2023-07-26 DIAGNOSIS — Z7985 Long-term (current) use of injectable non-insulin antidiabetic drugs: Secondary | ICD-10-CM | POA: Insufficient documentation

## 2023-07-26 DIAGNOSIS — I5020 Unspecified systolic (congestive) heart failure: Secondary | ICD-10-CM

## 2023-07-26 DIAGNOSIS — Z951 Presence of aortocoronary bypass graft: Secondary | ICD-10-CM

## 2023-07-26 DIAGNOSIS — Z8674 Personal history of sudden cardiac arrest: Secondary | ICD-10-CM | POA: Insufficient documentation

## 2023-07-26 DIAGNOSIS — Z95810 Presence of automatic (implantable) cardiac defibrillator: Secondary | ICD-10-CM | POA: Insufficient documentation

## 2023-07-26 DIAGNOSIS — Z7984 Long term (current) use of oral hypoglycemic drugs: Secondary | ICD-10-CM | POA: Diagnosis not present

## 2023-07-26 LAB — BASIC METABOLIC PANEL
Anion gap: 9 (ref 5–15)
BUN: 12 mg/dL (ref 8–23)
CO2: 26 mmol/L (ref 22–32)
Calcium: 9 mg/dL (ref 8.9–10.3)
Chloride: 102 mmol/L (ref 98–111)
Creatinine, Ser: 1 mg/dL (ref 0.61–1.24)
GFR, Estimated: >60 mL/min (ref >60)
Glucose, Bld: 120 mg/dL — ABNORMAL HIGH (ref 70–99)
Potassium: 4.1 mmol/L (ref 3.5–5.1)
Sodium: 137 mmol/L (ref 135–145)

## 2023-07-26 LAB — BRAIN NATRIURETIC PEPTIDE: B Natriuretic Peptide: 64.1 pg/mL (ref 0.0–100.0)

## 2023-07-26 NOTE — Patient Instructions (Addendum)
Medication Changes:  No Changes In Medications at this time.   Lab Work:  Labs done today, your results will be available in MyChart, we will contact you for abnormal readings.  Testing/Procedures:  Your physician has requested that you have an echocardiogram. Echocardiography is a painless test that uses sound waves to create images of your heart. It provides your doctor with information about the size and shape of your heart and how well your heart's chambers and valves are working. This procedure takes approximately one hour. There are no restrictions for this procedure. Please do NOT wear cologne, perfume, aftershave, or lotions (deodorant is allowed). Please arrive 15 minutes prior to your appointment time.  Please note: We ask at that you not bring children with you during ultrasound (echo/ vascular) testing. Due to room size and safety concerns, children are not allowed in the ultrasound rooms during exams. Our front office staff cannot provide observation of children in our lobby area while testing is being conducted. An adult accompanying a patient to their appointment will only be allowed in the ultrasound room at the discretion of the ultrasound technician under special circumstances. We apologize for any inconvenience.   Follow-Up in: 2 months as scheduled   PARKING CODE FOR JAN- 1126   PARKING CODE FOR FEB- 1127  At the [facility], you and your health needs are our priority. We have a designated team specialized in the treatment of Heart Failure. This Care Team includes your primary Heart Failure Specialized Cardiologist (physician), Advanced Practice Providers (APPs- Physician Assistants and Nurse Practitioners), and Pharmacist who all work together to provide you with the care you need, when you need it.   You may see any of the following providers on your designated Care Team at your next follow up:  Dr. Arvilla Meres Dr. Marca Ancona Dr. Dorthula Nettles Dr. Theresia Bough Tonye Becket, NP Robbie Lis, [place] [facility] [place], [place] Brynda Peon, NP [place] Lee, NP Karle Plumber, PharmD   Please be sure to bring in all your medications bottles to every appointment.   Need to Contact us:  If you have any questions or concerns before your next appointment please send [place] a message through [place] or call our office at [phone].    TO LEAVE A MESSAGE FOR THE NURSE SELECT OPTION 2, PLEASE LEAVE A MESSAGE INCLUDING: YOUR NAME DATE OF BIRTH CALL BACK NUMBER REASON FOR CALL**this is important as we prioritize the call backs  YOU WILL RECEIVE A CALL BACK THE SAME DAY AS LONG AS YOU CALL BEFORE 4:00 PM

## 2023-08-04 ENCOUNTER — Ambulatory Visit: Payer: Medicare Other

## 2023-08-04 ENCOUNTER — Telehealth: Payer: Self-pay | Admitting: Pharmacist

## 2023-08-04 DIAGNOSIS — I469 Cardiac arrest, cause unspecified: Secondary | ICD-10-CM

## 2023-08-04 DIAGNOSIS — I4901 Ventricular fibrillation: Secondary | ICD-10-CM

## 2023-08-04 LAB — CUP PACEART REMOTE DEVICE CHECK
Battery Remaining Longevity: 162 mo
Battery Voltage: 3.03 V
Brady Statistic RA Percent Paced: INVALID
Brady Statistic RV Percent Paced: 0.02 %
Date Time Interrogation Session: 20250129232119
HighPow Impedance: 70 Ohm
Implantable Lead Connection Status: 753985
Implantable Lead Implant Date: 20240501
Implantable Lead Location: 753860
Implantable Pulse Generator Implant Date: 20240501
Lead Channel Impedance Value: 323 Ohm
Lead Channel Impedance Value: 399 Ohm
Lead Channel Pacing Threshold Amplitude: 0.875 V
Lead Channel Pacing Threshold Pulse Width: 0.4 ms
Lead Channel Sensing Intrinsic Amplitude: 21.1 mV
Lead Channel Setting Pacing Amplitude: 2 V
Lead Channel Setting Pacing Pulse Width: 0.4 ms
Lead Channel Setting Sensing Sensitivity: 0.3 mV
Zone Setting Status: 755011

## 2023-08-04 NOTE — Telephone Encounter (Signed)
Poke to patient, discussed last lipid lab LDLc 25, TG 192, TC 97.  Patient reports he did not fat for that lab. And over holiday season enjoying lot of sweets/carb. Suggested healthy diet and regular exercise for elevated TG. [facility] switched his Repatha to Praluent 150 mg to make it more affordable. Tolerates that well will continue with current treatment.

## 2023-08-04 NOTE — Addendum Note (Signed)
Addended by: Tylene Fantasia on: 08/04/2023 11:48 AM   Modules accepted: Orders

## 2023-08-09 ENCOUNTER — Ambulatory Visit (HOSPITAL_COMMUNITY)
Admission: RE | Admit: 2023-08-09 | Discharge: 2023-08-09 | Disposition: A | Discharge status: Home / Self Care | Payer: Medicare Other | Source: Ambulatory Visit | Attending: Cardiology | Admitting: Cardiology

## 2023-08-09 DIAGNOSIS — Z87891 Personal history of nicotine dependence: Secondary | ICD-10-CM | POA: Diagnosis not present

## 2023-08-09 DIAGNOSIS — I110 Hypertensive heart disease with heart failure: Secondary | ICD-10-CM | POA: Diagnosis not present

## 2023-08-09 DIAGNOSIS — E119 Type 2 diabetes mellitus without complications: Secondary | ICD-10-CM | POA: Insufficient documentation

## 2023-08-09 DIAGNOSIS — Z006 Encounter for examination for normal comparison and control in clinical research program: Secondary | ICD-10-CM

## 2023-08-09 DIAGNOSIS — E785 Hyperlipidemia, unspecified: Secondary | ICD-10-CM | POA: Diagnosis not present

## 2023-08-09 DIAGNOSIS — I5020 Unspecified systolic (congestive) heart failure: Secondary | ICD-10-CM | POA: Insufficient documentation

## 2023-08-09 DIAGNOSIS — I2510 Atherosclerotic heart disease of native coronary artery without angina pectoris: Secondary | ICD-10-CM | POA: Insufficient documentation

## 2023-08-09 DIAGNOSIS — I351 Nonrheumatic aortic (valve) insufficiency: Secondary | ICD-10-CM | POA: Insufficient documentation

## 2023-08-09 LAB — ECHOCARDIOGRAM COMPLETE
AR max vel: 2.26 cm2
AV Area VTI: 1.87 cm2
AV Area mean vel: 1.87 cm2
AV Mean grad: 4 mm[Hg]
AV Peak grad: 6.3 mm[Hg]
Ao pk vel: 1.25 m/s
Area-P 1/2: 2.37 cm2
Calc EF: 30.5 %
S' Lateral: 4.6 cm
Single Plane A2C EF: 33.9 %
Single Plane A4C EF: 32.9 %

## 2023-08-09 MED ORDER — PERFLUTREN LIPID MICROSPHERE
1.0000 mL | INTRAVENOUS | Status: AC | PRN
  Administered 2023-08-09: 3 mL via INTRAVENOUS

## 2023-08-09 NOTE — Research (Signed)
 SITE: [id number]     Subject # [id number]   Subprotocol: A  Inclusion Criteria  Patients who meet all of the following criteria are eligible for enrollment as study participants:  Yes No  Age > 69 years old X   Eligible to wear Holter Study X    Exclusion Criteria  Patients who meet any of these criteria are not eligible for enrollment as study participants: Yes No  1. Receiving any mechanical (respiratory or circulatory) or renal support therapy at Screening or during Visit #1.  X  2.  Any other conditions that in the opinion of the investigators are likely to prevent compliance with the study protocol or pose a safety concern if the subject participates in the study.  X  3. Poor tolerance, namely susceptible to severe skin allergies from ECG adhesive patch application.  X   Protocol: REV H                                     Residential Zip code [phone] (First 3 digits ONLY)                                             [facility] Informed Consent   Subject Name: [name].  Subject met inclusion and exclusion criteria.  The informed consent form, study requirements and expectations were reviewed with the subject. Subject had opportunity to read consent and questions and concerns were addressed prior to the signing of the consent form.  The subject verbalized understanding of the trial requirements.  The subject agreed to participate in the [facility] EF ACT trial and signed the informed consent at 11:37 on 09-Aug-2023.  The informed consent was obtained prior to performance of any protocol-specific procedures for the subject.  A copy of the signed informed consent was given to the subject and a copy was placed in the subject's medical record.   Dorthea LITTIE Louder          Current Outpatient Medications:    albuterol (VENTOLIN HFA) 108 (90 Base) MCG/ACT inhaler, Inhale into the lungs., Disp: , Rfl:    Alirocumab (PRALUENT) 150 MG/ML SOAJ, Inject 1 mL (150 mg total) into the skin every 14  (fourteen) days., Disp: , Rfl:    aspirin  81 MG chewable tablet, Chew 81 mg by mouth daily., Disp: , Rfl:    b complex vitamins capsule, Take 1 capsule by mouth daily., Disp: , Rfl:    carvedilol  (COREG ) 25 MG tablet, Take 1 tablet (25 mg total) by mouth 2 (two) times daily with a meal., Disp: 180 tablet, Rfl: 3   Cholecalciferol  (VITAMIN D3) 50 MCG (2000 UT) TABS, Take 2,000 Units by mouth daily., Disp: , Rfl:    diclofenac Sodium (VOLTAREN) 1 % GEL, Apply 2 g topically daily as needed (pain)., Disp: , Rfl:    empagliflozin  (JARDIANCE ) 10 MG TABS tablet, Take 1 tablet (10 mg total) by mouth daily., Disp: 30 tablet, Rfl: 3   magnesium  oxide (MAG-OX) 400 (240 Mg) MG tablet, Take 400 mg by mouth at bedtime., Disp: , Rfl:    pantoprazole  (PROTONIX ) 40 MG tablet, TAKE 1 TABLET (40 MG TOTAL) BY MOUTH DAILY., Disp: 90 tablet, Rfl: 0   rosuvastatin  (CRESTOR ) 20 MG tablet, Take 1 tablet (20 mg total) by mouth  daily., Disp: 90 tablet, Rfl: 3   sacubitril -valsartan  (ENTRESTO ) 49-51 MG, Take 1 tablet by mouth 2 (two) times daily., Disp: 60 tablet, Rfl: 11   Semaglutide , 1 MG/DOSE, 4 MG/3ML SOPN, Inject 1 mg into the skin once a week., Disp: , Rfl:    spironolactone  (ALDACTONE ) 25 MG tablet, Take 25 mg by mouth daily. , Disp: , Rfl:

## 2023-09-09 NOTE — Progress Notes (Signed)
 Remote ICD transmission.

## 2023-09-16 NOTE — Addendum Note (Signed)
 Encounter addended by: Howell Rucks, RDCS on: 09/16/2023 3:32 PM  Actions taken: Imaging Exam ended

## 2023-09-23 ENCOUNTER — Encounter (HOSPITAL_COMMUNITY): Payer: Self-pay | Admitting: Cardiology

## 2023-09-23 ENCOUNTER — Ambulatory Visit (HOSPITAL_COMMUNITY)
Admission: RE | Admit: 2023-09-23 | Discharge: 2023-09-23 | Disposition: A | Discharge status: Home / Self Care | Payer: Medicare Other | Source: Ambulatory Visit | Attending: Cardiology | Admitting: Cardiology

## 2023-09-23 VITALS — BP 98/64 | HR 66 | Ht 72.0 in | Wt 224.2 lb

## 2023-09-23 DIAGNOSIS — Z8673 Personal history of transient ischemic attack (TIA), and cerebral infarction without residual deficits: Secondary | ICD-10-CM | POA: Diagnosis not present

## 2023-09-23 DIAGNOSIS — E785 Hyperlipidemia, unspecified: Secondary | ICD-10-CM | POA: Diagnosis not present

## 2023-09-23 DIAGNOSIS — Z95820 Peripheral vascular angioplasty status with implants and grafts: Secondary | ICD-10-CM | POA: Insufficient documentation

## 2023-09-23 DIAGNOSIS — I110 Hypertensive heart disease with heart failure: Secondary | ICD-10-CM | POA: Insufficient documentation

## 2023-09-23 DIAGNOSIS — Z951 Presence of aortocoronary bypass graft: Secondary | ICD-10-CM | POA: Diagnosis present

## 2023-09-23 DIAGNOSIS — I5022 Chronic systolic (congestive) heart failure: Secondary | ICD-10-CM | POA: Insufficient documentation

## 2023-09-23 DIAGNOSIS — I6501 Occlusion and stenosis of right vertebral artery: Secondary | ICD-10-CM | POA: Insufficient documentation

## 2023-09-23 DIAGNOSIS — I739 Peripheral vascular disease, unspecified: Secondary | ICD-10-CM | POA: Insufficient documentation

## 2023-09-23 DIAGNOSIS — Z8679 Personal history of other diseases of the circulatory system: Secondary | ICD-10-CM | POA: Diagnosis not present

## 2023-09-23 DIAGNOSIS — I2510 Atherosclerotic heart disease of native coronary artery without angina pectoris: Secondary | ICD-10-CM | POA: Diagnosis present

## 2023-09-23 DIAGNOSIS — I255 Ischemic cardiomyopathy: Secondary | ICD-10-CM | POA: Diagnosis not present

## 2023-09-23 DIAGNOSIS — E119 Type 2 diabetes mellitus without complications: Secondary | ICD-10-CM | POA: Diagnosis not present

## 2023-09-23 DIAGNOSIS — Z7985 Long-term (current) use of injectable non-insulin antidiabetic drugs: Secondary | ICD-10-CM | POA: Diagnosis not present

## 2023-09-23 DIAGNOSIS — Z79899 Other long term (current) drug therapy: Secondary | ICD-10-CM | POA: Insufficient documentation

## 2023-09-23 DIAGNOSIS — Z8674 Personal history of sudden cardiac arrest: Secondary | ICD-10-CM | POA: Diagnosis not present

## 2023-09-23 DIAGNOSIS — I5020 Unspecified systolic (congestive) heart failure: Secondary | ICD-10-CM

## 2023-09-23 LAB — BRAIN NATRIURETIC PEPTIDE: B Natriuretic Peptide: 59.5 pg/mL (ref 0.0–100.0)

## 2023-09-23 LAB — BASIC METABOLIC PANEL
Anion gap: 11 (ref 5–15)
BUN: 13 mg/dL (ref 8–23)
CO2: 23 mmol/L (ref 22–32)
Calcium: 9.2 mg/dL (ref 8.9–10.3)
Chloride: 103 mmol/L (ref 98–111)
Creatinine, Ser: 0.96 mg/dL (ref 0.61–1.24)
GFR, Estimated: >60 mL/min (ref >60)
Glucose, Bld: 127 mg/dL — ABNORMAL HIGH (ref 70–99)
Potassium: 4.4 mmol/L (ref 3.5–5.1)
Sodium: 137 mmol/L (ref 135–145)

## 2023-09-23 NOTE — Patient Instructions (Signed)
 Medication Changes:  No Changes In Medications at this time.   Lab Work:  Labs done today, your results will be available in MyChart, we will contact you for abnormal readings.  Follow-Up in: 4 MONTHS WITH AN ECHO PLEASE CALL OUR OFFICE AROUND MAY  TO GET SCHEDULED FOR YOUR APPOINTMENT. PHONE NUMBER IS [phone] OPTION 2   At the [facility], you and your health needs are our priority. We have a designated team specialized in the treatment of Heart Failure. This Care Team includes your primary Heart Failure Specialized Cardiologist (physician), Advanced Practice Providers (APPs- Physician Assistants and Nurse Practitioners), and Pharmacist who all work together to provide you with the care you need, when you need it.   You may see any of the following providers on your designated Care Team at your next follow up:  Dr. Arvilla Meres Dr. Marca Ancona Dr. Dorthula Nettles Dr. Theresia Bough Tonye Becket, NP Robbie Lis, [place] [facility] [place], [place] Brynda Peon, NP [place] Lee, NP Karle Plumber, PharmD   Please be sure to bring in all your medications bottles to every appointment.   Need to Contact us:  If you have any questions or concerns before your next appointment please send [place] a message through [place] or call our office at [phone].    TO LEAVE A MESSAGE FOR THE NURSE SELECT OPTION 2, PLEASE LEAVE A MESSAGE INCLUDING: YOUR NAME DATE OF BIRTH CALL BACK NUMBER REASON FOR CALL**this is important as we prioritize the call backs  YOU WILL RECEIVE A CALL BACK THE SAME DAY AS LONG AS YOU CALL BEFORE 4:00 PM

## 2023-09-23 NOTE — Progress Notes (Signed)
 ADVANCED HEART FAILURE CLINIC NOTE  Referring Physician: Tresa Garter, MD  Primary Care: Tresa Garter, MD   HPI: [name]. is a 68 y.o. male with CAD status post CABG x 4 in July 2003, chronic systolic heart failure, carotid artery disease status post right CEA and patch angioplasty in May 2018, PAD status post right iliofemoral endarterectomy and stenting of bilateral common iliac arteries in October 2022, hypertension, hyperlipidemia, type 2 diabetes, history of TIA and VT arrest presenting today to establish care.  He was most recently hospitalized in April 2024 after he suffered VT arrest during sedation/intubation for planned right transcarotid artery revascularization.  Echocardiogram shortly after with LVEF less than 20% and moderately reduced RV function.  He underwent right and left heart catheterization that showed total occlusion of the proximal LAD, CTO of the distal circumflex, subtotal occlusion of the proximal and mid circumflex and CTO of the RCA with patency of the RIMA to LAD and free LIMA to diagonal; occlusion of SVG to OM 2 and SVG to PDA.  Cardiac index at that time of 2.1.  He had a follow-up cardiac MRI with LVEF of 23% and underwent secondary mention ICD.  He was also started on amiodarone for a brief duration.  Interval hx: - He has been doing fairly well from a functional standpoint; no shortness of breath, LE edema, PND, orthopnea. Compliant with all medications. He has been picking up his activity levels very carefully. Still fairly inactive due to concerns about underlying HFrEF  Activity level/exercise tolerance:  NYHA II Orthopnea:  Sleeps on 2 pillows Paroxysmal noctural dyspnea:  No Chest pain/pressure:  No Orthostatic lightheadedness:  No Palpitations:  No Lower extremity edema:  No Presyncope/syncope:  No Cough:  No  Current Outpatient Medications  Medication Sig Dispense Refill   albuterol (VENTOLIN HFA) 108 (90 Base) MCG/ACT  inhaler Inhale into the lungs.     Alirocumab (PRALUENT) 150 MG/ML SOAJ Inject 1 mL (150 mg total) into the skin every 14 (fourteen) days.     aspirin 81 MG chewable tablet Chew 81 mg by mouth daily.     b complex vitamins capsule Take 1 capsule by mouth daily.     carvedilol (COREG) 25 MG tablet Take 1 tablet (25 mg total) by mouth 2 (two) times daily with a meal. 180 tablet 3   Cholecalciferol (VITAMIN D3) 50 MCG (2000 UT) TABS Take 2,000 Units by mouth daily.     diclofenac Sodium (VOLTAREN) 1 % GEL Apply 2 g topically daily as needed (pain).     empagliflozin (JARDIANCE) 10 MG TABS tablet Take 1 tablet (10 mg total) by mouth daily. 30 tablet 3   magnesium oxide (MAG-OX) 400 (240 Mg) MG tablet Take 400 mg by mouth at bedtime.     pantoprazole (PROTONIX) 40 MG tablet TAKE 1 TABLET (40 MG TOTAL) BY MOUTH DAILY. 90 tablet 0   rosuvastatin (CRESTOR) 20 MG tablet Take 1 tablet (20 mg total) by mouth daily. 90 tablet 3   sacubitril-valsartan (ENTRESTO) 49-51 MG Take 1 tablet by mouth 2 (two) times daily. 60 tablet 11   Semaglutide, 1 MG/DOSE, 4 MG/3ML SOPN Inject 1 mg into the skin once a week.     spironolactone (ALDACTONE) 25 MG tablet Take 25 mg by mouth daily.      No current facility-administered medications for this encounter.    PHYSICAL EXAM: Vitals:   09/23/23 1137  BP: 98/64  Pulse: 66  SpO2: 98%  GENERAL: NAD Lungs- CTA CARDIAC:  JVP: 6 cm          Normal rate with regular rhythm. No murmur.  Pulses 2+. No edema.  ABDOMEN: Soft, non-tender, non-distended.  EXTREMITIES: Warm and well perfused.  NEUROLOGIC: No obvious FND  DATA REVIEW  ECG: 12/08/22: NSR  As per my personal interpretation  ECHO: 01/05/23: LVEF 25%-30% TTE: Post arrest (4/24): EF < 20%, RV moderately down TTE: (2015): 30-35%  CATH: R/LHC (4/24): severe native vessel coronary artery disease with total occlusion of the proximal LAD, subtotal occlusion of the proximal and mid circumflex, total occlusion of  the distal circumflex, and total occlusion of the midportion of the nondominant RCA; s/p aortocoronary bypass surgery with continued patency of the RIMA to LAD and free LIMA to diagonal, total occlusion of the saphenous vein graft to OM 2 and saphenous vein graft to left PDA.  CO/CI 4.75/2.12, mild pulm htn with mean PA 27, PVR 2.5 WU. mRA 9, mPCWP 15   CMR (4/24):  LVEF 23%, RVEF 54%, LGE consistent with infarct in basal to mid inferior wall and basal to apical lateral wall.   ASSESSMENT & PLAN:  Heart failure with reduced ejection fraction Etiology of HF: Ischemic cardiomyopathy, left heart cath from 4/24 noted above with severe multivessel native coronary artery disease and now occlusion of the saphenous vein grafts.  Cardiac MRI with a EF of 23% and severe LGE in the basal to mid inferior wall NYHA class / AHA Stage:II; however, fairly inactive right now. Planning to pick up his activity level. CPX if needed in the future.  Volume status & Diuretics: Euvolemic; taking lasix PRN only. He has not required it.  Vasodilators: Entresto 49/51mg  BID;limited by SBP.  Beta-Blocker: Coreg 25mg  BID MRA: Spironolactone 25 mg daily; repeat BMP/BNP today.  Cardiometabolic: Jardiance 10 mg Devices therapies & Valvulopathies: Secondary prevention ICD placed in April 2024. Bedside TTE today with persistently reduced EF.  Advanced therapies: His severe LE PAD may limit options. Cardiac index of 2.1 in 4/24; Repeat TTE today (01/05/23) w/ LVEF of 30%. Improved from prior but I still suspect he may require VAD evaluation in the future. I performed a bedside TTE today which demonstrated persistently reduced EF. Will plan on CPX in the near future.   2.  Coronary artery disease status post CABG - s/p CABG x4 (LIMA as a free graft to the diagonal, RIMA to LAD, SVG to OM 2, and SVG to PDA) in 01/2002 with Dr. Dorris Fetch  - [facility] 4/24 showed severe native vessel coronary artery disease with total occlusion of the  proximal LAD and subtotal occlusion of the proximal and mid circumflex, total occlusion of the distal circumflex, and total occlusion of the midportion of the nondominant RCA, continued patency of the RIMA to LAD and free LIMA to diagonal.  Total occlusion of the saphenous vein graft to OM 2 and saphenous vein graft to left PDA. No targets for intervention  - stable without chest pain; we stopped ISDN several months ago.   3. VT Arrest - followed by Dr. Elberta Fortis; likely due to underlying ischemic heart disease with occluded SVG during intubation/sedation.  - No VT on device interrogation today.   4. PAD - carotid artery disease s/p right CEA and patch angioplasty in 11/2016, s/p right iliofemoral artery endarterectomy and patch angioplasty as well as stenting of bilateral common iliac arteries in 04/2021 - now w/ recurrent, symptomatic, high grade re-stenosis of the Rt ICA, the Lt ICA now  totally occluded. Aborted attempt at rt trans carotid revascularization 10/29/22 per above  - right TCAR 11/18/22 by Dr. Lenell Antu - on ASA, statin - He has mild cramping in the legs with exertion.   4. T2DM - A1C of 6.5 - Jardiance 10 - Ozempic  5. HLD - Repeat lipid panel with LDL of 68.  - Now on praluent; no issues.   I spent 35 minutes caring for this patient today including face to face time, ordering and reviewing labs, reviewing device interrogation today, counseling on regular physical activity, seeing the patient, documenting in the record, and arranging follow ups.   [name] Advanced Heart Failure Mechanical Circulatory Support

## 2023-10-26 ENCOUNTER — Telehealth (HOSPITAL_COMMUNITY): Payer: Self-pay | Admitting: Cardiology

## 2023-10-26 DIAGNOSIS — I5022 Chronic systolic (congestive) heart failure: Secondary | ICD-10-CM

## 2023-10-26 NOTE — Telephone Encounter (Signed)
 [facility]

## 2023-12-22 ENCOUNTER — Other Ambulatory Visit: Payer: Self-pay | Admitting: *Deleted

## 2023-12-22 DIAGNOSIS — I739 Peripheral vascular disease, unspecified: Secondary | ICD-10-CM

## 2023-12-22 DIAGNOSIS — I6521 Occlusion and stenosis of right carotid artery: Secondary | ICD-10-CM

## 2024-01-02 NOTE — Progress Notes (Unsigned)
 Office Note     CC:  follow up Requesting Provider:  Garald Karlynn GAILS, MD  HPI: [name]. is a 68 y.o. ([birth year]) male who presents for surveillance follow up of carotid stenosis and PAD. He most recently underwent right TCAR by Dr. Magda on 11/17/22. This was for symptomatic carotid stenosis with visual changes. He has done very well post operatively with no TIA or stroke like symptoms. He denies any ***  He has history of Bilateral common iliac kissing angioplasty and stenting (RCIA 11x35mm VBX, LCIA 10x68mm VBX) and right iliofemoral endarterectomy and profundaplasty with bovine pericardial patch angioplasty 04/29/21. He does have mild claudication symptoms. He is able to ambulate ***. Improved with rest.   He is medically managed on Aspirin  and Statin.    Past Medical History:  Diagnosis Date   AICD (automatic cardioverter/defibrillator) present 11/03/2022   Aortic valve sclerosis    aortic valve sclerosis   Arthritis    CAD (coronary artery disease)    Myoview  5/18: 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 no ischemia EF 27%   Carotid artery disease (HCC)    chronic total occlusion LCCA, retrograde flow from LECA to LICA, ,, 0-39%LICA, s/p Right CEA 2018   CHF (congestive heart failure) (HCC)    Closed head injury with concussion    x2   Complication of anesthesia    Low blood pressure after colonoscopy   Diabetes mellitus without complication (HCC)    Dizziness    Ejection fraction < 50%    EF 40%, echo 2006, no ICD needed  / EF 40%, echo, 11/2009   Former cigarette smoker    Hx of CABG 01/2002   01/2002   Hyperlipidemia    Low HDL   Hypertension    Mitral regurgitation    mild, echo, 11/2009   Myocardial infarction [facility])    they told me i had a silent heart attack   Peripheral vascular disease (HCC)    Dr. Cathlyn Garret   Sleep disorder    Dr Corrie, lose weight and consider sleep study later   Sleep disorder    TIA (transient ischemic  attack)    by history   Volume overload     Past Surgical History:  Procedure Laterality Date   CHOLECYSTECTOMY     COLONOSCOPY  03/19/2009   CORONARY ARTERY BYPASS GRAFT     ENDARTERECTOMY Right 11/05/2016   Procedure: ENDARTERECTOMY RIGHT CAROTID;  Surgeon: Oris Krystal FALCON, MD;  Location: [facility] OR;  Service: Vascular;  Laterality: Right;   ENDARTERECTOMY FEMORAL Right 04/29/2021   Procedure: RIGHT ILIOFEMORAL ARTERY ENDARTERECTOMY WITH PROFUNDOPLASTY;  Surgeon: Magda Debby SAILOR, MD;  Location: [facility] OR;  Service: Vascular;  Laterality: Right;   ICD IMPLANT N/A 11/03/2022   Procedure: ICD IMPLANT;  Surgeon: Inocencio Soyla Lunger, MD;  Location: [facility] INVASIVE CV LAB;  Service: Cardiovascular;  Laterality: N/A;   INSERTION OF ILIAC STENT Right 04/29/2021   Procedure: AORTAGRAM INSERTION OF BILATERAL COMMON ILIAC ARTERY KISSING STENTS;  Surgeon: Magda Debby SAILOR, MD;  Location[facility] OR;  Service: Vascular;  Laterality: Right;   PATCH ANGIOPLASTY Right 11/05/2016   Procedure: RIGHT CAROTID ARTERY PATCH ANGIOPLASTY USING HEMASHIELD PLATINUM FINESSE PATCH;  Surgeon: Oris Krystal FALCON, MD;  Location: [facility] OR;  Service: Vascular;  Laterality: Right;   PATCH ANGIOPLASTY Right 04/29/2021   Procedure: PATCH ANGIOPLASTY OF RIGHT COMMON FEMORAL ARTERY UISNG XENOSURE BOVINE PERICARDIUM PACTH;  Surgeon: Magda Debby SAILOR, MD;  Location: [facility] OR;  Service: Vascular;  Laterality: Right;   RIGHT/LEFT HEART CATH AND CORONARY/GRAFT ANGIOGRAPHY N/A 10/29/2022   Procedure: RIGHT/LEFT HEART CATH AND CORONARY/GRAFT ANGIOGRAPHY;  Surgeon: Wonda Sharper, MD;  Location: [facility] INVASIVE CV LAB;  Service: Cardiovascular;  Laterality: N/A;   SHOULDER ARTHROSCOPY Right 05/25/2018   Procedure: RIGHT ARTHROSCOPY SHOULDER LABRAL DEBRIDEMENT, rotator cuff debridement,  BICEPS TENOTOMY and subacromial decompression;  Surgeon: Dozier Soulier, MD;  Location: [facility] OR;  Service: Orthopedics;  Laterality: Right;   TRANSCAROTID ARTERY REVASCULARIZATION  Right  10/28/2022   Procedure: Right Transcarotid Artery Revascularization;  Surgeon: Magda Debby SAILOR, MD;  Location: [facility] OR;  Service: Vascular;  Laterality: Right;   TRANSCAROTID ARTERY REVASCULARIZATION  Right 11/17/2022   Procedure: Right Transcarotid Artery Revascularization;  Surgeon: Magda Debby SAILOR, MD;  Location: [facility] OR;  Service: Vascular;  Laterality: Right;   ULTRASOUND GUIDANCE FOR VASCULAR ACCESS Left 04/29/2021   Procedure: ULTRASOUND GUIDANCE FOR VASCULAR ACCESS, LEFT FEMORAL ARTERY;  Surgeon: Magda Debby SAILOR, MD;  Location: [facility] OR;  Service: Vascular;  Laterality: Left;    Social History   Socioeconomic History   Marital status: Married    Spouse name: [name]   Number of children: 2   Years of education: Not on file   Highest education level: Associate degree: occupational, Scientist, product/process development, or vocational program  Occupational History   Occupation: Retired  Tobacco Use   Smoking status: Former    Current packs/day: 0.00    Types: Cigarettes    Quit date: 05/05/2002    Years since quitting: 21.6    Passive exposure: Never   Smokeless tobacco: Never  Vaping Use   Vaping status: Never Used  Substance and Sexual Activity   Alcohol use: Yes    Alcohol/week: 0.0 standard drinks of alcohol    Comment: beer every now and then   Drug use: No   Sexual activity: Yes  Other Topics Concern   Not on file  Social History Narrative   No Regular exercise   Lives with wife-2025   Social Drivers of Health   Financial Resource Strain: Low Risk  (07/20/2023)   Overall Financial Resource Strain (CARDIA)    Difficulty of Paying Living Expenses: Not hard at all  Food Insecurity: No Food Insecurity (07/20/2023)   Hunger Vital Sign    Worried About Running Out of Food in the Last Year: Never true    Ran Out of Food in the Last Year: Never true  Transportation Needs: No Transportation Needs (07/20/2023)   PRAPARE - Administrator, Civil Service (Medical): No    Lack of Transportation  (Non-Medical): No  Physical Activity: Inactive (07/20/2023)   Exercise Vital Sign    Days of Exercise per Week: 0 days    Minutes of Exercise per Session: 0 min  Stress: No Stress Concern Present (07/20/2023)   [employer] of Occupational Health - Occupational Stress Questionnaire    Feeling of Stress : Not at all  Social Connections: Socially Integrated (07/20/2023)   Social Connection and Isolation Panel    Frequency of Communication with Friends and Family: More than three times a week    Frequency of Social Gatherings with Friends and Family: Once a week    Attends Religious Services: More than 4 times per year    Active Member of [employer] or Organizations: Yes    Attends Banker Meetings: More than 4 times per year    Marital Status: Married  Catering manager Violence: Not At Risk (07/20/2023)  Humiliation, Afraid, Rape, and Kick questionnaire    Fear of Current or Ex-Partner: No    Emotionally Abused: No    Physically Abused: No    Sexually Abused: No   *** Family History  Problem Relation Age of Onset   Stroke Mother    Diabetes Father    COPD Father    Arthritis Father    Heart disease Father    Coronary artery disease Other        Male 1st degree relative <50   Diabetes Other        1st degree relative   Stroke Other        Male 1st degree relative<50   Deep vein thrombosis Daughter    Pulmonary fibrosis Daughter     Current Outpatient Medications  Medication Sig Dispense Refill   albuterol (VENTOLIN HFA) 108 (90 Base) MCG/ACT inhaler Inhale into the lungs.     Alirocumab (PRALUENT) 150 MG/ML SOAJ Inject 1 mL (150 mg total) into the skin every 14 (fourteen) days.     aspirin  81 MG chewable tablet Chew 81 mg by mouth daily.     b complex vitamins capsule Take 1 capsule by mouth daily.     carvedilol  (COREG ) 25 MG tablet Take 1 tablet (25 mg total) by mouth 2 (two) times daily with a meal. 180 tablet 3   Cholecalciferol  (VITAMIN D3) 50 MCG (2000 UT)  TABS Take 2,000 Units by mouth daily.     diclofenac Sodium (VOLTAREN) 1 % GEL Apply 2 g topically daily as needed (pain).     empagliflozin  (JARDIANCE ) 10 MG TABS tablet Take 1 tablet (10 mg total) by mouth daily. 30 tablet 3   magnesium  oxide (MAG-OX) 400 (240 Mg) MG tablet Take 400 mg by mouth at bedtime.     pantoprazole  (PROTONIX ) 40 MG tablet TAKE 1 TABLET (40 MG TOTAL) BY MOUTH DAILY. 90 tablet 0   rosuvastatin  (CRESTOR ) 20 MG tablet Take 1 tablet (20 mg total) by mouth daily. 90 tablet 3   sacubitril -valsartan  (ENTRESTO ) 49-51 MG Take 1 tablet by mouth 2 (two) times daily. 60 tablet 11   Semaglutide , 1 MG/DOSE, 4 MG/3ML SOPN Inject 1 mg into the skin once a week.     spironolactone  (ALDACTONE ) 25 MG tablet Take 25 mg by mouth daily.      No current facility-administered medications for this visit.    No Known Allergies   REVIEW OF SYSTEMS:  *** [X]  denotes positive finding, [ ]  denotes negative finding Cardiac  Comments:  Chest pain or chest pressure:    Shortness of breath upon exertion:    Short of breath when lying flat:    Irregular heart rhythm:        Vascular    Pain in calf, thigh, or hip brought on by ambulation:    Pain in feet at night that wakes you up from your sleep:     Blood clot in your veins:    Leg swelling:         Pulmonary    Oxygen at home:    Productive cough:     Wheezing:         Neurologic    Sudden weakness in arms or legs:     Sudden numbness in arms or legs:     Sudden onset of difficulty speaking or slurred speech:    Temporary loss of vision in one eye:     Problems with dizziness:  Gastrointestinal    Blood in stool:     Vomited blood:         Genitourinary    Burning when urinating:     Blood in urine:        Psychiatric    Major depression:         Hematologic    Bleeding problems:    Problems with blood clotting too easily:        Skin    Rashes or ulcers:        Constitutional    Fever or chills:       PHYSICAL EXAMINATION:  There were no vitals filed for this visit.  General:  WDWN in NAD; vital signs documented above Gait: Not observed HENT: WNL, normocephalic Pulmonary: normal non-labored breathing Cardiac: {Desc; regular/irreg:14544} HR Abdomen: soft Vascular Exam/Pulses: *** Extremities: {With/Without:20273} ischemic changes, {With/Without:20273} Gangrene , {With/Without:20273} cellulitis; {With/Without:20273} open wounds;  Musculoskeletal: no muscle wasting or atrophy  Neurologic: A&O X 3 Psychiatric:  The pt has Normal affect.   Non-Invasive Vascular Imaging:   ***    ASSESSMENT/PLAN:: 68 y.o. male here for follow up for ***   -***   Lucas Damme, PA-C [facility] [phone]  Clinic MD:   Tomie Gaskins

## 2024-01-03 ENCOUNTER — Ambulatory Visit (INDEPENDENT_AMBULATORY_CARE_PROVIDER_SITE_OTHER): Admitting: Physician Assistant

## 2024-01-03 ENCOUNTER — Ambulatory Visit (HOSPITAL_COMMUNITY)
Admission: RE | Admit: 2024-01-03 | Discharge: 2024-01-03 | Disposition: A | Discharge status: Home / Self Care | Source: Ambulatory Visit | Attending: Vascular Surgery | Admitting: Vascular Surgery

## 2024-01-03 ENCOUNTER — Ambulatory Visit (HOSPITAL_COMMUNITY)
Admission: RE | Admit: 2024-01-03 | Discharge: 2024-01-03 | Disposition: A | Discharge status: Home / Self Care | Source: Ambulatory Visit | Attending: Vascular Surgery

## 2024-01-03 VITALS — BP 97/55 | HR 71 | Temp 97.8°F | Resp 18 | Ht 72.0 in | Wt 223.3 lb

## 2024-01-03 DIAGNOSIS — I6521 Occlusion and stenosis of right carotid artery: Secondary | ICD-10-CM | POA: Insufficient documentation

## 2024-01-03 DIAGNOSIS — I6522 Occlusion and stenosis of left carotid artery: Secondary | ICD-10-CM | POA: Insufficient documentation

## 2024-01-03 DIAGNOSIS — I739 Peripheral vascular disease, unspecified: Secondary | ICD-10-CM | POA: Insufficient documentation

## 2024-01-03 LAB — VAS US ABI WITH/WO TBI
Left ABI: 0.89
Right ABI: 0.85

## 2024-02-22 ENCOUNTER — Other Ambulatory Visit: Payer: Self-pay | Admitting: Medical Genetics

## 2024-03-07 NOTE — Progress Notes (Signed)
 ADVANCED HEART FAILURE CLINIC NOTE  Referring Physician: Garald Karlynn GAILS, MD  Primary Care: Garald Karlynn GAILS, MD  CC: Heart failure with reduced ejection fraction  HPI: [name]. is a 68 y.o. male with CAD status post CABG x 4 in July 2003, chronic systolic heart failure, carotid artery disease status post right CEA and patch angioplasty in May 2018, PAD status post right iliofemoral endarterectomy and stenting of bilateral common iliac arteries in October 2022, hypertension, hyperlipidemia, type 2 diabetes, history of TIA and VT arrest presenting today to establish care.  He was most recently hospitalized in April 2024 after he suffered VT arrest during sedation/intubation for planned right transcarotid artery revascularization.  Echocardiogram shortly after with LVEF less than 20% and moderately reduced RV function.  He underwent right and left heart catheterization that showed total occlusion of the proximal LAD, CTO of the distal circumflex, subtotal occlusion of the proximal and mid circumflex and CTO of the RCA with patency of the RIMA to LAD and free LIMA to diagonal; occlusion of SVG to OM 2 and SVG to PDA.  Cardiac index at that time of 2.1.  He had a follow-up cardiac MRI with LVEF of 23% and underwent secondary mention ICD.  He was also started on amiodarone  for a brief duration.  Interval hx: - Doing very well from a cardiac standpoint.  Reports no issues with his medications.  No lightheadedness, chest pain, shortness of breath, lower extremity edema.  Very motivated to improve his health   Current Outpatient Medications  Medication Sig Dispense Refill   Alirocumab (PRALUENT) 150 MG/ML SOAJ Inject 1 mL (150 mg total) into the skin every 14 (fourteen) days.     aspirin  81 MG chewable tablet Chew 81 mg by mouth daily.     b complex vitamins capsule Take 1 capsule by mouth daily.     carvedilol  (COREG ) 25 MG tablet Take 1 tablet (25 mg total) by mouth 2 (two)  times daily with a meal. 180 tablet 3   Cholecalciferol  (VITAMIN D3) 50 MCG (2000 UT) TABS Take 2,000 Units by mouth daily.     diclofenac Sodium (VOLTAREN) 1 % GEL Apply 2 g topically daily as needed (pain).     empagliflozin  (JARDIANCE ) 10 MG TABS tablet Take 1 tablet (10 mg total) by mouth daily. 30 tablet 3   magnesium  oxide (MAG-OX) 400 (240 Mg) MG tablet Take 400 mg by mouth at bedtime.     pantoprazole  (PROTONIX ) 40 MG tablet TAKE 1 TABLET (40 MG TOTAL) BY MOUTH DAILY. 90 tablet 0   rosuvastatin  (CRESTOR ) 20 MG tablet Take 1 tablet (20 mg total) by mouth daily. 90 tablet 3   sacubitril -valsartan  (ENTRESTO ) 49-51 MG Take 1 tablet by mouth 2 (two) times daily. 60 tablet 11   Semaglutide , 1 MG/DOSE, 4 MG/3ML SOPN Inject 1 mg into the skin once a week.     spironolactone  (ALDACTONE ) 25 MG tablet Take 25 mg by mouth daily.      albuterol (VENTOLIN HFA) 108 (90 Base) MCG/ACT inhaler Inhale into the lungs. (Patient not taking: Reported on 03/08/2024)     No current facility-administered medications for this encounter.    PHYSICAL EXAM: Vitals:   03/08/24 0939  BP: 104/68  Pulse: 72  SpO2: 96%   GENERAL: NAD Lungs-CTA CARDIAC:  JVP: 6 cm          Normal rate with regular rhythm.  No murmur.  Pulses 2+.  No edema.  ABDOMEN: Soft,  non-tender, non-distended.  EXTREMITIES: Warm and well perfused.  NEUROLOGIC: No obvious FND   DATA REVIEW  ECG: 12/08/22: NSR  As per my personal interpretation  ECHO: 01/05/23: LVEF 25%-30% TTE: Post arrest (4/24): EF < 20%, RV moderately down TTE: (2015): 30-35%  CATH: R/LHC (4/24): severe native vessel coronary artery disease with total occlusion of the proximal LAD, subtotal occlusion of the proximal and mid circumflex, total occlusion of the distal circumflex, and total occlusion of the midportion of the nondominant RCA; s/p aortocoronary bypass surgery with continued patency of the RIMA to LAD and free LIMA to diagonal, total occlusion of the  saphenous vein graft to OM 2 and saphenous vein graft to left PDA.  CO/CI 4.75/2.12, mild pulm htn with mean PA 27, PVR 2.5 WU. mRA 9, mPCWP 15   CMR (4/24):  LVEF 23%, RVEF 54%, LGE consistent with infarct in basal to mid inferior wall and basal to apical lateral wall.   ASSESSMENT & PLAN:  Heart failure with reduced ejection fraction Etiology of HF: Ischemic cardiomyopathy, left heart cath from 4/24 noted above with severe multivessel native coronary artery disease and now occlusion of the saphenous vein grafts.  Cardiac MRI with a EF of 23% and severe LGE in the basal to mid inferior wall NYHA class / AHA Stage: NYHA IIB Volume status & Diuretics: Euvolemic; PRN lasix  only. Has not required it.  Vasodilators: Entresto  49/51mg  BID; limited by SBP, no lightheadedness. Will continue.  Beta-Blocker: Coreg  25mg  BID MRA: Spironolactone  25 mg daily;  Cardiometabolic: jardiance  10mg  daily.  Devices therapies & Valvulopathies: Secondary prevention ICD placed in April 2024. Repeat TTE today with improvement in LVEF approaching 40%.  Advanced therapies: His severe LE PAD may limit options. Cardiac index of 2.1 in 4/24; Repeat TTE today (01/05/23) w/ LVEF of 30%. Improved from prior but I still suspect he may require VAD evaluation in the future. I performed a bedside TTE today which demonstrated persistently reduced EF. Will plan on CPX in the near future.   2.  Coronary artery disease status post CABG - s/p CABG x4 (LIMA as a free graft to the diagonal, RIMA to LAD, SVG to OM 2, and SVG to PDA) in 01/2002 with Dr. Kerrin  - [facility] 4/24 showed severe native vessel coronary artery disease with total occlusion of the proximal LAD and subtotal occlusion of the proximal and mid circumflex, total occlusion of the distal circumflex, and total occlusion of the midportion of the nondominant RCA, continued patency of the RIMA to LAD and free LIMA to diagonal.  Total occlusion of the saphenous vein graft to OM 2  and saphenous vein graft to left PDA. No targets for intervention  - stable without chest pain; we stopped ISDN several months ago.  - no chest pain.  3. VT Arrest - followed by Dr. Inocencio; likely due to underlying ischemic heart disease with occluded SVG during intubation/sedation.  - No VT on device interrogation today.   4. 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 Aborted attempt at rt trans carotid revascularization 10/29/22 per above  - right TCAR 11/18/22 by Dr. Magda - on ASA, statin - He has mild cramping in the legs with exertion.   4. T2DM - A1C of 6.5 -  Jardiance  10 - Now on Ozempic   5. HLD - Repeat lipid panel with LDL of 68.  - Now on praluent; no issues.   6. Obesity  - Started semaglutide ; now on 2mg .   Reviewed labs from 9//25: Serum creatinine 0.94, K4.5, HDL 37 (LDL not calculated)  I spent 35 minutes caring for this patient today including face to face time, ordering and reviewing labs, reviewing echocardiogram from today with him, reviewing labs noted above, seeing the patient, documenting in the record, and arranging follow ups.    Margrit Minner Advanced Heart Failure Mechanical Circulatory Support

## 2024-03-08 ENCOUNTER — Encounter (HOSPITAL_COMMUNITY): Payer: Self-pay | Admitting: Cardiology

## 2024-03-08 ENCOUNTER — Ambulatory Visit (HOSPITAL_COMMUNITY)
Admission: RE | Admit: 2024-03-08 | Discharge: 2024-03-08 | Disposition: A | Discharge status: Home / Self Care | Source: Ambulatory Visit | Attending: Cardiology | Admitting: Cardiology

## 2024-03-08 ENCOUNTER — Ambulatory Visit (HOSPITAL_BASED_OUTPATIENT_CLINIC_OR_DEPARTMENT_OTHER)
Admission: RE | Admit: 2024-03-08 | Discharge: 2024-03-08 | Disposition: A | Discharge status: Home / Self Care | Source: Ambulatory Visit | Attending: Cardiology | Admitting: Cardiology

## 2024-03-08 VITALS — BP 104/68 | HR 72 | Ht 72.0 in | Wt 229.0 lb

## 2024-03-08 DIAGNOSIS — Z8673 Personal history of transient ischemic attack (TIA), and cerebral infarction without residual deficits: Secondary | ICD-10-CM | POA: Diagnosis not present

## 2024-03-08 DIAGNOSIS — I5020 Unspecified systolic (congestive) heart failure: Secondary | ICD-10-CM

## 2024-03-08 DIAGNOSIS — Z7985 Long-term (current) use of injectable non-insulin antidiabetic drugs: Secondary | ICD-10-CM | POA: Insufficient documentation

## 2024-03-08 DIAGNOSIS — Z95810 Presence of automatic (implantable) cardiac defibrillator: Secondary | ICD-10-CM

## 2024-03-08 DIAGNOSIS — I2510 Atherosclerotic heart disease of native coronary artery without angina pectoris: Secondary | ICD-10-CM | POA: Insufficient documentation

## 2024-03-08 DIAGNOSIS — Z8674 Personal history of sudden cardiac arrest: Secondary | ICD-10-CM | POA: Diagnosis not present

## 2024-03-08 DIAGNOSIS — E669 Obesity, unspecified: Secondary | ICD-10-CM | POA: Insufficient documentation

## 2024-03-08 DIAGNOSIS — I358 Other nonrheumatic aortic valve disorders: Secondary | ICD-10-CM | POA: Diagnosis not present

## 2024-03-08 DIAGNOSIS — I110 Hypertensive heart disease with heart failure: Secondary | ICD-10-CM | POA: Diagnosis not present

## 2024-03-08 DIAGNOSIS — E1151 Type 2 diabetes mellitus with diabetic peripheral angiopathy without gangrene: Secondary | ICD-10-CM | POA: Insufficient documentation

## 2024-03-08 DIAGNOSIS — I255 Ischemic cardiomyopathy: Secondary | ICD-10-CM | POA: Diagnosis not present

## 2024-03-08 DIAGNOSIS — E119 Type 2 diabetes mellitus without complications: Secondary | ICD-10-CM | POA: Insufficient documentation

## 2024-03-08 DIAGNOSIS — R252 Cramp and spasm: Secondary | ICD-10-CM | POA: Diagnosis not present

## 2024-03-08 DIAGNOSIS — E785 Hyperlipidemia, unspecified: Secondary | ICD-10-CM | POA: Diagnosis not present

## 2024-03-08 DIAGNOSIS — I739 Peripheral vascular disease, unspecified: Secondary | ICD-10-CM | POA: Diagnosis not present

## 2024-03-08 DIAGNOSIS — I6521 Occlusion and stenosis of right carotid artery: Secondary | ICD-10-CM | POA: Insufficient documentation

## 2024-03-08 DIAGNOSIS — Z7982 Long term (current) use of aspirin: Secondary | ICD-10-CM | POA: Diagnosis not present

## 2024-03-08 DIAGNOSIS — Z7984 Long term (current) use of oral hypoglycemic drugs: Secondary | ICD-10-CM | POA: Diagnosis not present

## 2024-03-08 DIAGNOSIS — Z951 Presence of aortocoronary bypass graft: Secondary | ICD-10-CM

## 2024-03-08 LAB — BASIC METABOLIC PANEL WITH GFR
Anion gap: 10 (ref 5–15)
BUN: 10 mg/dL (ref 8–23)
CO2: 25 mmol/L (ref 22–32)
Calcium: 9.4 mg/dL (ref 8.9–10.3)
Chloride: 103 mmol/L (ref 98–111)
Creatinine, Ser: 0.94 mg/dL (ref 0.61–1.24)
GFR, Estimated: >60 mL/min (ref >60)
Glucose, Bld: 229 mg/dL — ABNORMAL HIGH (ref 70–99)
Potassium: 4.5 mmol/L (ref 3.5–5.1)
Sodium: 138 mmol/L (ref 135–145)

## 2024-03-08 LAB — ECHOCARDIOGRAM COMPLETE
Area-P 1/2: 3.08 cm2
Est EF: 35
S' Lateral: 4.9 cm

## 2024-03-08 LAB — LIPID PANEL
Cholesterol: 62 mg/dL (ref 0–200)
HDL: 37 mg/dL — ABNORMAL LOW (ref >40)
Total CHOL/HDL Ratio: 1.7 ratio
Triglycerides: 129 mg/dL (ref <150)
VLDL: 26 mg/dL (ref 0–40)

## 2024-03-08 LAB — BRAIN NATRIURETIC PEPTIDE: B Natriuretic Peptide: 56.5 pg/mL (ref 0.0–100.0)

## 2024-03-08 NOTE — Patient Instructions (Signed)
 Medication Changes:  No Changes In Medications at this time.   Lab Work:  Labs done today, your results will be available in MyChart, we will contact you for abnormal readings.  Follow-Up in: 4 months with an Echo PLEASE CALL OUR OFFICE AROUND NOVEMBER TO GET SCHEDULED FOR YOUR JANUARY APPOINTMENT. PHONE NUMBER IS [phone] OPTION 2 r  At the [facility], you and your health needs are our priority. We have a designated team specialized in the treatment of Heart Failure. This Care Team includes your primary Heart Failure Specialized Cardiologist (physician), Advanced Practice Providers (APPs- Physician Assistants and Nurse Practitioners), and Pharmacist who all work together to provide you with the care you need, when you need it.   You may see any of the following providers on your designated Care Team at your next follow up:  Dr. Toribio Fuel Dr. Ezra Shuck Dr. Ria Commander Dr. Odis Brownie Greig Mosses, NP Caffie Shed, [place] [facility] [place], [place] Beckey Coe, NP [place] Lee, NP Tinnie Redman, PharmD   Please be sure to bring in all your medications bottles to every appointment.   Need to Contact Us :  If you have any questions or concerns before your next appointment please send us  a message through [place] or call our office at [phone].    TO LEAVE A MESSAGE FOR THE NURSE SELECT OPTION 2, PLEASE LEAVE A MESSAGE INCLUDING: YOUR NAME DATE OF BIRTH CALL BACK NUMBER REASON FOR CALL**this is important as we prioritize the call backs  YOU WILL RECEIVE A CALL BACK THE SAME DAY AS LONG AS YOU CALL BEFORE 4:00 PM

## 2024-03-08 NOTE — Progress Notes (Signed)
 Echocardiogram 2D Echocardiogram has been performed.  Koleen KANDICE Popper, RDCS 03/08/2024, 8:51 AM

## 2024-04-16 ENCOUNTER — Other Ambulatory Visit (HOSPITAL_COMMUNITY)
Admission: RE | Admit: 2024-04-16 | Discharge: 2024-04-16 | Disposition: A | Discharge status: Home / Self Care | Payer: Self-pay | Source: Ambulatory Visit | Attending: Medical Genetics | Admitting: Medical Genetics

## 2024-04-24 LAB — GENECONNECT MOLECULAR SCREEN: Genetic Analysis Overall Interpretation: NEGATIVE

## 2024-07-02 ENCOUNTER — Telehealth (HOSPITAL_COMMUNITY): Payer: Self-pay

## 2024-07-02 NOTE — Telephone Encounter (Signed)
 Called to confirm/remind patient of their appointment at the [facility] on 07/03/24.   Appointment:   [x] Confirmed  [] Left mess   [] No answer/No voice mail  [] VM Full/unable to leave message  [] Phone not in service  Patient reminded to bring all medications and/or complete list.  Confirmed patient has transportation. Gave directions, instructed to utilize valet parking.

## 2024-07-03 ENCOUNTER — Encounter (HOSPITAL_COMMUNITY): Payer: Self-pay

## 2024-07-03 ENCOUNTER — Ambulatory Visit (HOSPITAL_COMMUNITY)
Admission: RE | Admit: 2024-07-03 | Discharge: 2024-07-03 | Disposition: A | Discharge status: Home / Self Care | Source: Ambulatory Visit | Attending: Cardiology | Admitting: Cardiology

## 2024-07-03 ENCOUNTER — Ambulatory Visit (HOSPITAL_BASED_OUTPATIENT_CLINIC_OR_DEPARTMENT_OTHER)
Admission: RE | Admit: 2024-07-03 | Discharge: 2024-07-03 | Disposition: A | Discharge status: Home / Self Care | Source: Ambulatory Visit | Attending: Cardiology | Admitting: Cardiology

## 2024-07-03 ENCOUNTER — Ambulatory Visit (HOSPITAL_COMMUNITY): Payer: Self-pay | Admitting: Family Medicine

## 2024-07-03 VITALS — BP 92/60 | HR 67 | Wt 229.0 lb

## 2024-07-03 DIAGNOSIS — I358 Other nonrheumatic aortic valve disorders: Secondary | ICD-10-CM | POA: Diagnosis not present

## 2024-07-03 DIAGNOSIS — Z95810 Presence of automatic (implantable) cardiac defibrillator: Secondary | ICD-10-CM | POA: Diagnosis not present

## 2024-07-03 DIAGNOSIS — E669 Obesity, unspecified: Secondary | ICD-10-CM | POA: Diagnosis not present

## 2024-07-03 DIAGNOSIS — Z6831 Body mass index (BMI) 31.0-31.9, adult: Secondary | ICD-10-CM | POA: Insufficient documentation

## 2024-07-03 DIAGNOSIS — Z7984 Long term (current) use of oral hypoglycemic drugs: Secondary | ICD-10-CM | POA: Diagnosis not present

## 2024-07-03 DIAGNOSIS — Z79899 Other long term (current) drug therapy: Secondary | ICD-10-CM | POA: Insufficient documentation

## 2024-07-03 DIAGNOSIS — Z8674 Personal history of sudden cardiac arrest: Secondary | ICD-10-CM | POA: Insufficient documentation

## 2024-07-03 DIAGNOSIS — I5020 Unspecified systolic (congestive) heart failure: Secondary | ICD-10-CM | POA: Diagnosis not present

## 2024-07-03 DIAGNOSIS — Z8679 Personal history of other diseases of the circulatory system: Secondary | ICD-10-CM | POA: Diagnosis not present

## 2024-07-03 DIAGNOSIS — Z8673 Personal history of transient ischemic attack (TIA), and cerebral infarction without residual deficits: Secondary | ICD-10-CM | POA: Diagnosis not present

## 2024-07-03 DIAGNOSIS — I6521 Occlusion and stenosis of right carotid artery: Secondary | ICD-10-CM | POA: Insufficient documentation

## 2024-07-03 DIAGNOSIS — I110 Hypertensive heart disease with heart failure: Secondary | ICD-10-CM | POA: Insufficient documentation

## 2024-07-03 DIAGNOSIS — Z7982 Long term (current) use of aspirin: Secondary | ICD-10-CM | POA: Insufficient documentation

## 2024-07-03 DIAGNOSIS — Z7985 Long-term (current) use of injectable non-insulin antidiabetic drugs: Secondary | ICD-10-CM | POA: Diagnosis not present

## 2024-07-03 DIAGNOSIS — I2510 Atherosclerotic heart disease of native coronary artery without angina pectoris: Secondary | ICD-10-CM | POA: Diagnosis not present

## 2024-07-03 DIAGNOSIS — E785 Hyperlipidemia, unspecified: Secondary | ICD-10-CM | POA: Insufficient documentation

## 2024-07-03 DIAGNOSIS — E1151 Type 2 diabetes mellitus with diabetic peripheral angiopathy without gangrene: Secondary | ICD-10-CM | POA: Diagnosis not present

## 2024-07-03 DIAGNOSIS — Z951 Presence of aortocoronary bypass graft: Secondary | ICD-10-CM | POA: Diagnosis not present

## 2024-07-03 DIAGNOSIS — I739 Peripheral vascular disease, unspecified: Secondary | ICD-10-CM | POA: Diagnosis not present

## 2024-07-03 DIAGNOSIS — I5022 Chronic systolic (congestive) heart failure: Secondary | ICD-10-CM | POA: Insufficient documentation

## 2024-07-03 LAB — ECHOCARDIOGRAM COMPLETE
AR max vel: 1.6 cm2
AV Area VTI: 1.49 cm2
AV Area mean vel: 1.5 cm2
AV Mean grad: 5 mmHg
AV Peak grad: 9.5 mmHg
Ao pk vel: 1.54 m/s
Area-P 1/2: 4.54 cm2
Calc EF: 39.9 %
S' Lateral: 4.8 cm
Single Plane A2C EF: 40.1 %
Single Plane A4C EF: 40.2 %

## 2024-07-03 LAB — BASIC METABOLIC PANEL WITH GFR
Anion gap: 11 (ref 5–15)
BUN: 13 mg/dL (ref 8–23)
CO2: 25 mmol/L (ref 22–32)
Calcium: 9.2 mg/dL (ref 8.9–10.3)
Chloride: 103 mmol/L (ref 98–111)
Creatinine, Ser: 0.84 mg/dL (ref 0.61–1.24)
GFR, Estimated: >60 mL/min
Glucose, Bld: 171 mg/dL — ABNORMAL HIGH (ref 70–99)
Potassium: 4.4 mmol/L (ref 3.5–5.1)
Sodium: 139 mmol/L (ref 135–145)

## 2024-07-03 NOTE — Progress Notes (Signed)
 "  ADVANCED HEART FAILURE CLINIC NOTE  Primary Care: Garald Karlynn GAILS, MD EP: Dr. Inocencio HF Cardiologist: previously Dr. Karen, assign to Dr. Rolan  HPI: [name]. is a 68 y.o. male with CAD status post CABG x 4 in July 2003, chronic systolic heart failure, carotid artery disease status post right CEA and patch angioplasty in May 2018, PAD status post right iliofemoral endarterectomy and stenting of bilateral common iliac arteries in October 2022, hypertension, hyperlipidemia, type 2 diabetes, history of TIA and VT arrest.  Admitted 10/2022 after he suffered VT arrest during sedation/intubation for planned right transcarotid artery revascularization.  Echo shortly afterwards showed EF less than 20% and moderately reduced RV function.  He underwent right and left heart catheterization that showed total occlusion of the proximal LAD, CTO of the distal circumflex, subtotal occlusion of the proximal and mid circumflex and CTO of the RCA with patency of the RIMA to LAD and free LIMA to diagonal; occlusion of SVG to OM 2 and SVG to PDA.  Cardiac index at that time of 2.1.  He had a follow-up cardiac MRI with LVEF of 23% and underwent secondary mention ICD.  He was also started on amiodarone  for a brief duration.  cMRI 6/24 showed LVEF 23%, RVEF 54%, subendocardial late gadolinium enhancement consistent with infarct in basal to mid inferior wall and basal to apical lateral wall. LGE is greater than 50% transmural suggesting inferior and lateral walls are nonviable  S/p ICD implant 5/24  Echo 7/24 showed EF 25-30%, RV ok.  Echo 2/25 showed EF 30-35%, G1DD, RV normal. Echo 9/25 showed EF 35%, G1DD, normal RV.  Today he returns for HF follow up. Overall feeling fine. Has intermittent SOB with ADLs or heavy yardwork, otherwise no undue dyspnea. Retired from landscape [employer], enjoying tinkering with gadgets. Denies palpitations, abnormal bleeding, CP, dizziness, edema, or  PND/Orthopnea. Appetite ok. Weight at home 220 pounds. Taking all medications.   Labs (1/25): LDL 25 Labs (9/25): K 4.5, creatinine 0.94    Current Outpatient Medications  Medication Sig Dispense Refill   albuterol (VENTOLIN HFA) 108 (90 Base) MCG/ACT inhaler Inhale into the lungs.     Alirocumab (PRALUENT) 150 MG/ML SOAJ Inject 1 mL (150 mg total) into the skin every 14 (fourteen) days.     aspirin  81 MG chewable tablet Chew 81 mg by mouth daily.     b complex vitamins capsule Take 1 capsule by mouth daily.     carvedilol  (COREG ) 25 MG tablet Take 1 tablet (25 mg total) by mouth 2 (two) times daily with a meal. 180 tablet 3   Cholecalciferol  (VITAMIN D3) 50 MCG (2000 UT) TABS Take 2,000 Units by mouth daily.     diclofenac Sodium (VOLTAREN) 1 % GEL Apply 2 g topically daily as needed (pain).     empagliflozin  (JARDIANCE ) 10 MG TABS tablet Take 1 tablet (10 mg total) by mouth daily. 30 tablet 3   magnesium  oxide (MAG-OX) 400 (240 Mg) MG tablet Take 400 mg by mouth at bedtime.     pantoprazole  (PROTONIX ) 40 MG tablet TAKE 1 TABLET (40 MG TOTAL) BY MOUTH DAILY. 90 tablet 0   rosuvastatin  (CRESTOR ) 20 MG tablet Take 1 tablet (20 mg total) by mouth daily. 90 tablet 3   sacubitril -valsartan  (ENTRESTO ) 49-51 MG Take 1 tablet by mouth 2 (two) times daily. 60 tablet 11   Semaglutide , 1 MG/DOSE, 4 MG/3ML SOPN Inject 1 mg into the skin once a week. (Patient taking differently: Inject  2 mg into the skin once a week. Once a week on Friday or Saturday)     spironolactone  (ALDACTONE ) 25 MG tablet Take 25 mg by mouth daily.      No current facility-administered medications for this encounter.   Wt Readings from Last 3 Encounters:  07/03/24 103.9 kg (229 lb)  03/08/24 103.9 kg (229 lb)  01/03/24 101.3 kg (223 lb 4.8 oz)   BP 92/60   Pulse 67   Wt 103.9 kg (229 lb)   SpO2 96%   BMI 31.06 kg/m   PHYSICAL EXAM: General:  NAD. No resp difficulty, walked into clinic HEENT: Normal Neck: Supple. No  JVD. Cor: Regular rate & rhythm. No rubs, gallops or murmurs. Lungs: Clear Abdomen: Soft, obese, nontender, nondistended.  Extremities: No cyanosis, clubbing, rash, edema Neuro: Alert & oriented x 3, moves all 4 extremities w/o difficulty. Affect pleasant.    DATA REVIEW  ECG: 12/08/22: NSR    03/08/24: NSR  ECHO: 2015: 30-35% Post arrest (4/24): EF < 20%, RV moderately down 01/05/23: LVEF 25%-30% 2/25: EF 30-35%, G1DD, RV normal.  9/25: EF 35%, G1DD, normal RV.   CATH: R/LHC (4/24): severe native vessel coronary artery disease with total occlusion of the proximal LAD, subtotal occlusion of the proximal and mid circumflex, total occlusion of the distal circumflex, and total occlusion of the midportion of the nondominant RCA; s/p aortocoronary bypass surgery with continued patency of the RIMA to LAD and free LIMA to diagonal, total occlusion of the saphenous vein graft to OM 2 and saphenous vein graft to left PDA.  CO/CI 4.75/2.12, mild pulm htn with mean PA 27, PVR 2.5 WU. mRA 9, mPCWP 15   CMR (4/24):  LVEF 23%, RVEF 54%, LGE consistent with infarct in basal to mid inferior wall and basal to apical lateral wall.   ASSESSMENT & PLAN:  1. Chronic Systolic Heart Failure: iCM. Left heart cath from 4/24 noted above with severe multivessel native coronary artery disease and now occlusion of the saphenous vein grafts.  Cardiac MRI with a EF of 23% and severe LGE in the basal to mid inferior wall. S/p MDT ICD implantation. Today, NYHA I-II. He is not volume overloaded on exam. - Continue spironolactone  25 mg daily. BMET toda.y - Continue Entresto  49/51 mg bid. - Continue Jardiance  10 mg daily. - Continue Coreg  25 mg bid. - Suspect he may need advanced therapies down the road; PAD may limit this. Will arrange CPX to risk stratify. - Echo today 07/03/24, EF appears 30-40% on my read, awaiting official MD interpretation.   2. CAD: s/p CABG x4 (LIMA as a free graft to the diagonal, RIMA to  LAD, SVG to OM 2, and SVG to PDA) in 01/2002 with Dr. Kerrin. [facility] 4/24 showed severe native vessel coronary artery disease with total occlusion of the proximal LAD and subtotal occlusion of the proximal and mid circumflex, total occlusion of the distal circumflex, and total occlusion of the midportion of the nondominant RCA, continued patency of the RIMA to LAD and free LIMA to diagonal.  Total occlusion of the saphenous vein graft to OM 2 and saphenous vein graft to left PDA. No targets for intervention. No chest pain. - Continue ASA 81 - Continue Praluent and rosuvastatin  20 mg daily  3. VT Arrest: Likely due to underlying ischemic heart disease, with occluded SVG during intubation/sedation.  - Followed by Dr. Inocencio - Unable to interrogate device today.  4. PAD: carotid artery disease s/p right CEA and patch  angioplasty in 11/2016, s/p right iliofemoral artery endarterectomy and patch angioplasty as well as stenting of bilateral common iliac arteries in 04/2021.  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 Aborted attempt at rt trans carotid revascularization 10/29/22 per above  - s/p right TCAR 11/18/22 by Dr. Magda - No pedal ulcers or rest pain. - Continue ASA + statin  4. T2DM: Last A1C 6.5 - Continue GLP1 and SGLT2i  5. HLD: Continue Praluent and Crestor   6. Obesity: Body mass index is 31.06 kg/m.  - Continue GLP1  Follow up in 3 months with Dr Rolan Harlene Gainer, FNP-BC 07/03/2024  "

## 2024-07-03 NOTE — Patient Instructions (Signed)
 There has been no changes to your medications.  Labs done today, your results will be available in MyChart, we will contact you for abnormal readings.  You are scheduled for a Cardiopulmonary Exercise (CPX) Test as [facility] on: Date:      Time:   Expect to be in the lab for 2 hours. Please plan to arrive 30 minutes prior to your appointment. You may be asked to reschedule your test if you arrive 20 minutes or more after your scheduled appointment time.  Main Campus address: [address] [place], [place] [address] You may arrive to the Main Entrance A or Entrance C (free valet parking is available at both). -Main Entrance A (on [address]) :proceed to admitting for check in -Entrance C (on [facility]): proceed to [employer] parking or under hospital deck parking using this code _________  Check In: [facility] waiting room (1st floor)   General Instructions for the day of the test (Please follow all instructions from your physician): Refrain from ingesting a heavy meal, alcohol, or caffeine or using tobacco products within 2 hours of the test (DO NOT FAST for mare than 8 hours). You may have all other non-alcoholic, non -caffeinated beverage,a light snack (crackers,a piece of fruit, carrot sticks, toast bagel,etc) up to your appointment. Avoid significant exertion or exercise within 24 hours of your test. Be prepared to exercise and sweat. Your clothing should permit freedom of movement and include walking or running shoes. Women bring loose fitting short sleeved blouse.  This evaluation may be fatiguing and you may wish ti have someone accompany you to the assessment to drive you home afterward. Bring a list of your medications with you, including dosage and frequency you take the medications (  I.e.,once per day, twice per day, etc). Take all medications as prescribed, unless noted below or instructed to do so by your physician.  Please do not take the following  medications prior to your CPX:  _________________________________________________  _________________________________________________  Brief description of the test: A brief lung test will be performed. This will involve you taking deep breaths and blowing hard and fast through your mouth. During these , a clip will be on your nose and you will be breathing through a breathing device.   For the exercise portion of the test you will be walking on a treadmill, or riding a stationary bike, to your maximal effor or until symptoms such as chest pain, shortness of breath, leg pain or dizziness limit your exercise. You will be breathing in and out of a breathing device through your mouth (a clip will be on your nose again). Your heart rate, ECG, blood pressure, oxygen saturations, breathing rate and depth, amount of oxygen you consume and amount of carbon dioxide you produce will be measured and monitored throughout the exercise test.  If you need to cancel or reschedule your appointment please call [phone] If you have further questions please call your physician or Damien Nunnery at [phone]  Your physician recommends that you schedule a follow-up appointment in: 3 months ( March 2026) ** PLEASE CALL THE OFFICE IN 3 WEEKS TO ARRANGE YOUR FOLLOW UP APPOINTMENT.**  If you have any questions or concerns before your next appointment please send us  a message through [place] or call our office at [phone].    TO LEAVE A MESSAGE FOR THE NURSE SELECT OPTION 2, PLEASE LEAVE A MESSAGE INCLUDING: YOUR NAME DATE OF BIRTH CALL BACK NUMBER REASON FOR CALL**this is  important as we prioritize the call backs  YOU WILL RECEIVE A CALL BACK THE SAME DAY AS LONG AS YOU CALL BEFORE 4:00 PM At the [facility], you and your health needs are our priority. As part of our continuing mission to provide you with exceptional heart care, we have created designated Provider Care Teams. These Care Teams  include your primary Cardiologist (physician) and Advanced Practice Providers (APPs- Physician Assistants and Nurse Practitioners) who all work together to provide you with the care you need, when you need it.   You may see any of the following providers on your designated Care Team at your next follow up: Dr Toribio Fuel Dr Ezra Shuck Dr. Morene Brownie Greig Mosses, NP Caffie Shed, [place] [facility] [place], [place] Beckey Coe, NP Jordan Lee, NP Ellouise Class, NP Tinnie Redman, PharmD Jaun Bash, PharmD   Please be sure to bring in all your medications bottles to every appointment.    Thank you for choosing  [facility]

## 2024-07-11 ENCOUNTER — Ambulatory Visit (HOSPITAL_COMMUNITY): Attending: Cardiology

## 2024-07-11 DIAGNOSIS — I5020 Unspecified systolic (congestive) heart failure: Secondary | ICD-10-CM | POA: Insufficient documentation

## 2024-07-23 ENCOUNTER — Ambulatory Visit: Payer: Medicare Other

## 2024-08-14 ENCOUNTER — Ambulatory Visit

## 2024-09-26 ENCOUNTER — Ambulatory Visit (HOSPITAL_COMMUNITY): Admitting: Cardiology
